# Patient Record
Sex: Female | Born: 1950 | Race: White | Hispanic: No | Marital: Married | Smoking: Former smoker
Health system: Southern US, Community
[De-identification: ages and names within clinical notes are randomized; demographics above are authoritative.]

## PROBLEM LIST (undated history)

## (undated) DIAGNOSIS — F419 Anxiety disorder, unspecified: Secondary | ICD-10-CM

## (undated) DIAGNOSIS — F32A Depression, unspecified: Secondary | ICD-10-CM

## (undated) DIAGNOSIS — F509 Eating disorder, unspecified: Secondary | ICD-10-CM

## (undated) DIAGNOSIS — F329 Major depressive disorder, single episode, unspecified: Secondary | ICD-10-CM

## (undated) DIAGNOSIS — T7840XA Allergy, unspecified, initial encounter: Secondary | ICD-10-CM

## (undated) DIAGNOSIS — Z9289 Personal history of other medical treatment: Secondary | ICD-10-CM

## (undated) DIAGNOSIS — H269 Unspecified cataract: Secondary | ICD-10-CM

## (undated) DIAGNOSIS — K449 Diaphragmatic hernia without obstruction or gangrene: Secondary | ICD-10-CM

## (undated) DIAGNOSIS — Z9889 Other specified postprocedural states: Secondary | ICD-10-CM

## (undated) DIAGNOSIS — K219 Gastro-esophageal reflux disease without esophagitis: Secondary | ICD-10-CM

## (undated) DIAGNOSIS — C801 Malignant (primary) neoplasm, unspecified: Secondary | ICD-10-CM

## (undated) HISTORY — PX: ABDOMINAL HYSTERECTOMY: SHX81

## (undated) HISTORY — PX: CARDIAC CATHETERIZATION: SHX172

## (undated) HISTORY — DX: Eating disorder, unspecified: F50.9

## (undated) HISTORY — DX: Unspecified cataract: H26.9

## (undated) HISTORY — DX: Personal history of other medical treatment: Z92.89

## (undated) HISTORY — DX: Allergy, unspecified, initial encounter: T78.40XA

## (undated) HISTORY — DX: Malignant (primary) neoplasm, unspecified: C80.1

## (undated) HISTORY — DX: Anxiety disorder, unspecified: F41.9

## (undated) HISTORY — PX: WISDOM TOOTH EXTRACTION: SHX21

## (undated) HISTORY — PX: APPENDECTOMY: SHX54

## (undated) HISTORY — DX: Gastro-esophageal reflux disease without esophagitis: K21.9

## (undated) HISTORY — DX: Depression, unspecified: F32.A

## (undated) HISTORY — DX: Diaphragmatic hernia without obstruction or gangrene: K44.9

## (undated) HISTORY — DX: Major depressive disorder, single episode, unspecified: F32.9

## (undated) HISTORY — DX: Other specified postprocedural states: Z98.890

---

## 1998-08-13 ENCOUNTER — Emergency Department (HOSPITAL_COMMUNITY): Admission: EM | Admit: 1998-08-13 | Discharge: 1998-08-13 | Payer: Self-pay | Admitting: Emergency Medicine

## 1998-09-08 ENCOUNTER — Other Ambulatory Visit: Admission: RE | Admit: 1998-09-08 | Discharge: 1998-09-08 | Payer: Self-pay | Admitting: Obstetrics and Gynecology

## 1999-09-15 ENCOUNTER — Encounter: Admission: RE | Admit: 1999-09-15 | Discharge: 1999-09-15 | Payer: Self-pay | Admitting: Family Medicine

## 1999-09-15 ENCOUNTER — Encounter: Payer: Self-pay | Admitting: Family Medicine

## 1999-09-15 HISTORY — PX: UPPER GASTROINTESTINAL ENDOSCOPY: SHX188

## 1999-09-21 ENCOUNTER — Encounter: Payer: Self-pay | Admitting: Family Medicine

## 1999-09-21 ENCOUNTER — Encounter: Admission: RE | Admit: 1999-09-21 | Discharge: 1999-09-21 | Payer: Self-pay | Admitting: Family Medicine

## 1999-09-21 DIAGNOSIS — Z9889 Other specified postprocedural states: Secondary | ICD-10-CM

## 1999-09-21 HISTORY — DX: Other specified postprocedural states: Z98.890

## 2000-04-19 DIAGNOSIS — Z9289 Personal history of other medical treatment: Secondary | ICD-10-CM

## 2000-04-19 HISTORY — PX: CARDIAC CATHETERIZATION: SHX172

## 2000-04-19 HISTORY — DX: Personal history of other medical treatment: Z92.89

## 2000-04-29 ENCOUNTER — Inpatient Hospital Stay (HOSPITAL_COMMUNITY): Admission: EM | Admit: 2000-04-29 | Discharge: 2000-05-01 | Payer: Self-pay | Admitting: Emergency Medicine

## 2000-04-29 ENCOUNTER — Encounter: Payer: Self-pay | Admitting: Family Medicine

## 2000-04-30 ENCOUNTER — Encounter: Payer: Self-pay | Admitting: Emergency Medicine

## 2001-01-26 ENCOUNTER — Ambulatory Visit (HOSPITAL_COMMUNITY): Admission: RE | Admit: 2001-01-26 | Discharge: 2001-01-26 | Payer: Self-pay | Admitting: Family Medicine

## 2001-02-13 ENCOUNTER — Encounter: Payer: Self-pay | Admitting: Neurosurgery

## 2001-02-13 ENCOUNTER — Ambulatory Visit (HOSPITAL_COMMUNITY): Admission: RE | Admit: 2001-02-13 | Discharge: 2001-02-13 | Payer: Self-pay | Admitting: Neurosurgery

## 2001-10-20 ENCOUNTER — Emergency Department (HOSPITAL_COMMUNITY): Admission: EM | Admit: 2001-10-20 | Discharge: 2001-10-20 | Payer: Self-pay

## 2002-01-27 ENCOUNTER — Encounter: Admission: RE | Admit: 2002-01-27 | Discharge: 2002-04-27 | Payer: Self-pay | Admitting: Family Medicine

## 2002-07-10 ENCOUNTER — Other Ambulatory Visit: Admission: RE | Admit: 2002-07-10 | Discharge: 2002-07-10 | Payer: Self-pay | Admitting: Obstetrics and Gynecology

## 2003-06-10 ENCOUNTER — Emergency Department (HOSPITAL_COMMUNITY): Admission: EM | Admit: 2003-06-10 | Discharge: 2003-06-10 | Payer: Self-pay | Admitting: Emergency Medicine

## 2004-08-29 ENCOUNTER — Encounter: Admission: RE | Admit: 2004-08-29 | Discharge: 2004-08-29 | Payer: Self-pay | Admitting: Family Medicine

## 2007-06-20 DIAGNOSIS — C801 Malignant (primary) neoplasm, unspecified: Secondary | ICD-10-CM

## 2007-06-20 HISTORY — DX: Malignant (primary) neoplasm, unspecified: C80.1

## 2008-08-26 ENCOUNTER — Encounter: Payer: Self-pay | Admitting: Family Medicine

## 2008-09-07 ENCOUNTER — Ambulatory Visit: Payer: Self-pay | Admitting: Family Medicine

## 2008-09-07 DIAGNOSIS — M79609 Pain in unspecified limb: Secondary | ICD-10-CM | POA: Insufficient documentation

## 2008-09-07 DIAGNOSIS — F339 Major depressive disorder, recurrent, unspecified: Secondary | ICD-10-CM | POA: Insufficient documentation

## 2008-09-07 DIAGNOSIS — F329 Major depressive disorder, single episode, unspecified: Secondary | ICD-10-CM

## 2008-09-07 HISTORY — DX: Pain in unspecified limb: M79.609

## 2009-05-19 ENCOUNTER — Ambulatory Visit: Payer: Self-pay | Admitting: Family Medicine

## 2009-05-19 DIAGNOSIS — L600 Ingrowing nail: Secondary | ICD-10-CM | POA: Insufficient documentation

## 2009-05-19 HISTORY — DX: Ingrowing nail: L60.0

## 2009-11-10 ENCOUNTER — Ambulatory Visit: Payer: Self-pay | Admitting: Family Medicine

## 2009-11-10 DIAGNOSIS — M2550 Pain in unspecified joint: Secondary | ICD-10-CM

## 2009-11-10 DIAGNOSIS — R0602 Shortness of breath: Secondary | ICD-10-CM

## 2009-11-10 DIAGNOSIS — R635 Abnormal weight gain: Secondary | ICD-10-CM | POA: Insufficient documentation

## 2009-11-10 HISTORY — DX: Abnormal weight gain: R63.5

## 2009-11-10 HISTORY — DX: Shortness of breath: R06.02

## 2009-11-10 HISTORY — DX: Pain in unspecified joint: M25.50

## 2009-11-11 ENCOUNTER — Ambulatory Visit: Payer: Self-pay | Admitting: Family Medicine

## 2009-11-11 LAB — CONVERTED CEMR LAB
ALT: 33 units/L (ref 0–35)
AST: 23 units/L (ref 0–37)
Albumin: 3.9 g/dL (ref 3.5–5.2)
Alkaline Phosphatase: 51 units/L (ref 39–117)
BUN: 17 mg/dL (ref 6–23)
Basophils Absolute: 0 10*3/uL (ref 0.0–0.1)
Basophils Relative: 0.6 % (ref 0.0–3.0)
Bilirubin Urine: NEGATIVE
Bilirubin, Direct: 0.1 mg/dL (ref 0.0–0.3)
Blood in Urine, dipstick: NEGATIVE
CO2: 28 meq/L (ref 19–32)
Calcium: 9.5 mg/dL (ref 8.4–10.5)
Chloride: 104 meq/L (ref 96–112)
Cholesterol: 172 mg/dL (ref 0–200)
Creatinine, Ser: 0.9 mg/dL (ref 0.4–1.2)
Eosinophils Absolute: 0.2 10*3/uL (ref 0.0–0.7)
Eosinophils Relative: 5 % (ref 0.0–5.0)
GFR calc non Af Amer: 66.28 mL/min (ref >60)
Glucose, Bld: 88 mg/dL (ref 70–99)
Glucose, Urine, Semiquant: NEGATIVE
HCT: 41.9 % (ref 36.0–46.0)
HDL: 50.4 mg/dL (ref >39.00)
Hemoglobin: 14.4 g/dL (ref 12.0–15.0)
Ketones, urine, test strip: NEGATIVE
LDL Cholesterol: 91 mg/dL (ref 0–99)
Lymphocytes Relative: 37.2 % (ref 12.0–46.0)
Lymphs Abs: 1.4 10*3/uL (ref 0.7–4.0)
MCHC: 34.4 g/dL (ref 30.0–36.0)
MCV: 94 fL (ref 78.0–100.0)
Monocytes Absolute: 0.4 10*3/uL (ref 0.1–1.0)
Monocytes Relative: 10.4 % (ref 3.0–12.0)
Neutro Abs: 1.8 10*3/uL (ref 1.4–7.7)
Neutrophils Relative %: 46.8 % (ref 43.0–77.0)
Nitrite: NEGATIVE
Platelets: 219 10*3/uL (ref 150.0–400.0)
Potassium: 4.3 meq/L (ref 3.5–5.1)
RBC: 4.46 M/uL (ref 3.87–5.11)
RDW: 13.2 % (ref 11.5–14.6)
Sodium: 142 meq/L (ref 135–145)
Specific Gravity, Urine: 1.025
TSH: 2.27 microintl units/mL (ref 0.35–5.50)
Total Bilirubin: 0.4 mg/dL (ref 0.3–1.2)
Total CHOL/HDL Ratio: 3
Total Protein: 6.2 g/dL (ref 6.0–8.3)
Triglycerides: 152 mg/dL — ABNORMAL HIGH (ref 0.0–149.0)
Urobilinogen, UA: 0.2
VLDL: 30.4 mg/dL (ref 0.0–40.0)
WBC Urine, dipstick: NEGATIVE
WBC: 3.8 10*3/uL — ABNORMAL LOW (ref 4.5–10.5)
pH: 7

## 2009-11-19 ENCOUNTER — Ambulatory Visit: Payer: Self-pay | Admitting: Family Medicine

## 2009-11-24 ENCOUNTER — Encounter (INDEPENDENT_AMBULATORY_CARE_PROVIDER_SITE_OTHER): Payer: Self-pay | Admitting: *Deleted

## 2009-11-25 ENCOUNTER — Encounter: Admission: RE | Admit: 2009-11-25 | Discharge: 2009-11-25 | Payer: Self-pay | Admitting: Family Medicine

## 2009-12-01 ENCOUNTER — Ambulatory Visit: Payer: Self-pay | Admitting: Family Medicine

## 2009-12-01 DIAGNOSIS — J069 Acute upper respiratory infection, unspecified: Secondary | ICD-10-CM | POA: Insufficient documentation

## 2009-12-01 HISTORY — DX: Acute upper respiratory infection, unspecified: J06.9

## 2009-12-22 ENCOUNTER — Encounter (INDEPENDENT_AMBULATORY_CARE_PROVIDER_SITE_OTHER): Payer: Self-pay | Admitting: *Deleted

## 2009-12-23 ENCOUNTER — Ambulatory Visit: Payer: Self-pay | Admitting: Gastroenterology

## 2009-12-31 ENCOUNTER — Ambulatory Visit: Payer: Self-pay | Admitting: Gastroenterology

## 2009-12-31 HISTORY — PX: COLONOSCOPY: SHX174

## 2010-01-05 ENCOUNTER — Encounter: Payer: Self-pay | Admitting: Gastroenterology

## 2010-04-28 ENCOUNTER — Ambulatory Visit (HOSPITAL_COMMUNITY): Admission: RE | Admit: 2010-04-28 | Discharge: 2010-04-28 | Payer: Self-pay | Admitting: Ophthalmology

## 2010-07-10 ENCOUNTER — Encounter: Payer: Self-pay | Admitting: Family Medicine

## 2010-07-19 NOTE — Letter (Signed)
Summary: [facility] Instructions  [facility]  [address] [place], [place] [address]   Phone: [phone]  Fax: [phone]       SHIGEKO MANARD    06-21-1960    MRN: [id number]        Procedure Day Dorna Bloom:  Farrell Ours  12/31/09     Arrival Time:  10:30AM     Procedure Time:  11:30AM     Location of Procedure:                    _X _  [facility] (4th Floor)                        PREPARATION FOR COLONOSCOPY WITH MOVIPREP   Starting 5 days prior to your procedure 12/26/09 do not eat nuts, seeds, popcorn, corn, beans, peas,  salads, or any raw vegetables.  Do not take any fiber supplements (e.g. Metamucil, Citrucel, and Benefiber).  THE DAY BEFORE YOUR PROCEDURE         DATE: 12/30/09  DAY: THURSDAY  1.  Drink clear liquids the entire day-NO SOLID FOOD  2.  Do not drink anything colored red or purple.  Avoid juices with pulp.  No orange juice.  3.  Drink at least 64 oz. (8 glasses) of fluid/clear liquids during the day to prevent dehydration and help the prep work efficiently.  CLEAR LIQUIDS INCLUDE: Water Jello Ice Popsicles Tea (sugar ok, no milk/cream) Powdered fruit flavored drinks Coffee (sugar ok, no milk/cream) Gatorade Juice: apple, white grape, white cranberry  Lemonade Clear bullion, consomm, broth Carbonated beverages (any kind) Strained chicken noodle soup Hard Candy                             4.  In the morning, mix first dose of MoviPrep solution:    Empty 1 Pouch A and 1 Pouch B into the disposable container    Add lukewarm drinking water to the top line of the container. Mix to dissolve    Refrigerate (mixed solution should be used within 24 hrs)  5.  Begin drinking the prep at 5:00 p.m. The MoviPrep container is divided by 4 marks.   Every 15 minutes drink the solution down to the next mark (approximately 8 oz) until the full liter is complete.   6.  Follow completed prep with 16 oz of clear liquid of your choice  (Nothing red or purple).  Continue to drink clear liquids until bedtime.  7.  Before going to bed, mix second dose of MoviPrep solution:    Empty 1 Pouch A and 1 Pouch B into the disposable container    Add lukewarm drinking water to the top line of the container. Mix to dissolve    Refrigerate  THE DAY OF YOUR PROCEDURE      DATE: 12/31/09  DAY: FRIDAY  Beginning at 6:30AM (5 hours before procedure):         1. Every 15 minutes, drink the solution down to the next mark (approx 8 oz) until the full liter is complete.  2. Follow completed prep with 16 oz. of clear liquid of your choice.    3. You may drink clear liquids until 9:30AM (2 HOURS BEFORE PROCEDURE).   MEDICATION INSTRUCTIONS  Unless otherwise instructed, you should take regular prescription medications with a small sip of water   as early as possible the morning  of your procedure.   Additional medication instructions: _Hold furosemide the am of your procedure.         OTHER INSTRUCTIONS  You will need a responsible adult at least 61 years of age to accompany you and drive you home.   This person must remain in the waiting room during your procedure.  Wear loose fitting clothing that is easily removed.  Leave jewelry and other valuables at home.  However, you may wish to bring a book to read or  an iPod/MP3 player to listen to music as you wait for your procedure to start.  Remove all body piercing jewelry and leave at home.  Total time from sign-in until discharge is approximately 2-3 hours.  You should go home directly after your procedure and rest.  You can resume normal activities the  day after your procedure.  The day of your procedure you should not:   Drive   Make legal decisions   Operate machinery   Drink alcohol   Return to work  You will receive specific instructions about eating, activities and medications before you leave.    The above instructions have been reviewed and  explained to me by   Clide Cliff, RN______________________    I fully understand and can verbalize these instructions _____________________________ Date _________

## 2010-07-19 NOTE — Letter (Signed)
Summary: Out of Work  Adult nurse at [employer]  [address]   [place], [place] [address]   Phone: [phone]  Fax: [phone]    December 01, 2009     Employee:  TATE JERKINS    To Whom It May Concern:   For Medical reasons, please excuse the above named employee from work for the following dates:  Start:   12-01-2009  End:   12-02-2009, may return to work on Friday 12-03-2009  If you need additional information, please feel free to contact our office.         Sincerely,        Evelena Peat, MD

## 2010-07-19 NOTE — Letter (Signed)
Summary: Results Letter  [facility]  [address] [place], [place] [address]   Phone: [phone]  Fax: [phone]        January 05, 2010 MRN: [id number]    [facility] [address] [place], [place]  [address]    Dear Ms. Nova,   At least one of the polyps removed during your recent procedure was proven to be adenomatous.  These are pre-cancerous polyps that may have grown into cancers if they had not been removed.  Based on current nationally recognized surveillance guidelines, I recommend that you have a repeat colonoscopy in 5 years.  We will therefore put your information in our reminder system and will contact you in 5 years to schedule a repeat procedure.  Please call if you have any questions or concerns.       Sincerely,  Rachael Fee MD  This letter has been electronically signed by your physician.  Appended Document: Results Letter letter mailed.

## 2010-07-19 NOTE — Assessment & Plan Note (Signed)
Summary: JOINT PAIN/JUST NOT FEELING WELL/RCD   Vital Signs:  Patient profile:   60 year old female Menstrual status:  postmenopausal Weight:      221 pounds Temp:     98.4 degrees F oral BP sitting:   110 / 82  (left arm) Cuff size:   large  Vitals Entered By: Sid Falcon LPN (Nov 10, 2009 1:39 PM) CC: Not feeling well, joint discomfort   History of Present Illness: Here with multiple complaints of fatigue, arthralgias (ankles, knees, wrists), and weight gain over several months.  Has not had CPE or any labs in several years.  No pitting edema.  Meds reviewed and no signif changes. Hx depression but those sxs are stable. Sleep generally OK.  Freq sensation of air hunger at rest mostly.  Occ exertional dyspnea.  No chest pain. No orthopnea.  Allergies: 1)  Prednisone (Prednisone) 2)  Penicillin V Potassium (Penicillin V Potassium)  Past History:  Past Medical History: Last updated: 10/16/2008 Depression Skin Cancer 2009 Eating disorder Glaucoma Anxiety disorder Small Hiatal hernia Cardiolyte stress test 11/01 Flex sig 09/21/99 ([facility]) Heart cath  11/01 Verdis Prime) UGI 09/15/99  Past Surgical History: Last updated: 10/16/2008 Hysterectomy 1979 Skin Cancer 2009 Appendectomy 1983  Family History: Last updated: 09/07/2008 Family History of Arthritis Family History Lung cancer parent Family History of Cardiovascular disorder  parent Strioke grandparent  Social History: Last updated: 09/07/2008 Occupation: Scientist, water quality Married  Risk Factors: Alcohol Use: <1 (09/07/2008)  Risk Factors: Smoking Status: never (09/07/2008) PMH-FH-SH reviewed for relevance  Review of Systems  The patient denies anorexia, fever, weight loss, chest pain, syncope, dyspnea on exertion, peripheral edema, prolonged cough, headaches, hemoptysis, abdominal pain, melena, hematochezia, severe indigestion/heartburn, depression, and enlarged lymph nodes.    Physical  Exam  General:  Well-developed,well-nourished,in no acute distress; alert,appropriate and cooperative throughout examination Eyes:  pupils equal, pupils round, and pupils reactive to light.   Ears:  External ear exam shows no significant lesions or deformities.  Otoscopic examination reveals clear canals, tympanic membranes are intact bilaterally without bulging, retraction, inflammation or discharge. Hearing is grossly normal bilaterally. Mouth:  Oral mucosa and oropharynx without lesions or exudates.  Teeth in good repair. Neck:  No deformities, masses, or tenderness noted. Lungs:  Normal respiratory effort, chest expands symmetrically. Lungs are clear to auscultation, no crackles or wheezes. Heart:  Normal rate and regular rhythm. S1 and S2 normal without gallop, murmur, click, rub or other extra sounds. Extremities:  no pitting edema.  No joint warmth or erythema. Neurologic:  alert & oriented X3, cranial nerves II-XII intact, strength normal in all extremities, and gait normal.   Cervical Nodes:  No lymphadenopathy noted Psych:  normally interactive, good eye contact, not anxious appearing, and not depressed appearing.     Impression & Recommendations:  Problem # 1:  ARTHRALGIA (ICD-719.40) No signs of active joint inflammation.  Problem # 2:  WEIGHT GAIN (ICD-783.1) discussed weight loss strategies.  Needs screening labs and these  will be scheduled.  Problem # 3:  DYSPNEA (ICD-786.05) ? anxiety related sense of air hunger.  Complete Medication List: 1)  Trazodone Hcl 100 Mg Tabs (Trazodone hcl) .[name].. 1-3 at  bedtime 2)  Alprazolam 1 Mg Tabs (Alprazolam) .... As needed 3)  Lumigan 0.03 % Soln (Bimatoprost) .... One drop to both eyes once daily 4)  Furosemide 20 Mg Tabs (Furosemide) .... 1/2 tab every other day 5)  Indomethacin 50 Mg Caps (Indomethacin) .... One by mouth q 8  hr as needed 6)  Combigan 0.2-0.5 % Soln (Brimonidine tartrate-timolol) .... One drop to right eye two  times a day  Patient Instructions: 1)  Schedule CPE and appropriate lab work.

## 2010-07-19 NOTE — Procedures (Signed)
Summary: Colonoscopy  Patient: [name] Note: All result statuses are Final unless otherwise noted.  Tests: (1) Colonoscopy (COL)   COL Colonoscopy           DONE     [facility]     [address]     [place], [place]  [address]           COLONOSCOPY PROCEDURE REPORT           PATIENT:  Emily [name], Emily [name]  MR#:  [id number]     BIRTHDATE:  [birth year], 58 yrs. old  GENDER:  female     ENDOSCOPIST:  Rachael Fee, MD     REF. BY:  Evelena Peat, M.D.     PROCEDURE DATE:  12/31/2009     PROCEDURE:  Colonoscopy with snare polypectomy     ASA CLASS:  Class II     INDICATIONS:  Routine Risk Screening     MEDICATIONS:   Fentanyl 50 mcg IV, Versed 5 mg IV           DESCRIPTION OF PROCEDURE:   After the risks benefits and     alternatives of the procedure were thoroughly explained, informed     consent was obtained.  Digital rectal exam was performed and     revealed no rectal masses.   The LB PCF-[id number] endoscope     was introduced through the anus and advanced to the cecum, which     was identified by both the appendix and ileocecal valve, without     limitations.  The quality of the prep was good, using MoviPrep.     The instrument was then slowly withdrawn as the colon was fully     examined.     <<PROCEDUREIMAGES>>           FINDINGS:  Mild diverticulosis was found in the sigmoid to     descending colon segments (see image6).  A sessile polyp was found     in the descending colon. This was 2mm across, removed with cold     snare and sent to pathology (jar 1) (see image4 and image5).  This     was otherwise a normal examination of the colon (see image7,     image2, and image3).   Retroflexed views in the rectum revealed no     abnormalities.    The scope was then withdrawn from the patient     and the procedure completed.           COMPLICATIONS:  None     ENDOSCOPIC IMPRESSION:     1) Mild diverticulosis in the sigmoid to descending colon     segments     2) Small sessile polyp in the descending colon, removed and sent     to pathology     3) Otherwise normal examination           RECOMMENDATIONS:     1) If the polyp(s) removed today are proven to be adenomatous     (pre-cancerous) polyps, you will need a repeat colonoscopy in 5     years. Otherwise you should continue to follow colorectal cancer     screening guidelines for "routine risk" patients with colonoscopy     in 10 years.     2) You will receive a letter within 1-2 weeks with the results     of your biopsy as well as final recommendations. Please call my     office if  you have not received a letter after 3 weeks.           ______________________________     Rachael Fee, MD           n.     eSIGNED:   Rachael Fee at 12/31/2009 12:05 PM           Rebeca Alert, [phone]  Note: An exclamation mark (!) indicates a result that was not dispersed into the flowsheet. Document Creation Date: 12/31/2009 12:05 PM _______________________________________________________________________  (1) Order result status: Final Collection or observation date-time: 12/31/2009 12:01 Requested date-time:  Receipt date-time:  Reported date-time:  Referring Physician:   Ordering Physician: Rob Bunting [phone]) Specimen Source:  Source: Launa Grill Order Number: [id number] Lab site:   Appended Document: Colonoscopy recall     Procedures Next Due Date:    Colonoscopy: 12/2014

## 2010-07-19 NOTE — Miscellaneous (Signed)
Summary: DIR COL...AS.  Clinical Lists Changes  Medications: Added new medication of MOVIPREP 100 GM  SOLR (PEG-KCL-NACL-NASULF-NA ASC-C) As directed - Signed Rx of MOVIPREP 100 GM  SOLR (PEG-KCL-NACL-NASULF-NA ASC-C) As directed;  #1 x 0;  Signed;  Entered by: Clide Cliff RN;  Authorized by: Rachael Fee MD;  Method used: Electronically to [facility]  [place] [address], [place], [place]  [address], Ph: [phone] or [phone], Fax: [phone] Observations: Added new observation of ALLERGY REV: Done (12/23/2009 7:59)    Prescriptions: MOVIPREP 100 GM  SOLR (PEG-KCL-NACL-NASULF-NA ASC-C) As directed  #1 x 0   Entered by:   Clide Cliff RN   Authorized by:   Rachael Fee MD   Signed by:   Clide Cliff RN on 12/23/2009   Method used:   Electronically to        [facility]  [place] [address] (retail)       [address]       [place], [place]  [address]       Ph: [phone] or [phone]       Fax: [phone]   RxID:   [id number]

## 2010-07-19 NOTE — Assessment & Plan Note (Signed)
Summary: cpx//ccm   Vital Signs:  Patient profile:   60 year old female Menstrual status:  hysterectomy Height:      67 inches Weight:      227 pounds BMI:     35.68 Temp:     98.7 degrees F oral Pulse rate:   80 / minute Pulse rhythm:   regular Resp:     12 per minute BP sitting:   130 / 82  (left arm) Cuff size:   large  Vitals Entered By: Sid Falcon LPN (November 19, 1608 3:00 PM) CC: CPX, labs done     Menstrual Status hysterectomy   History of Present Illness: Here for CPE.   No recent mammogram.  Last tetanus > 10 years. No hx colonoscopy.  Prior hysterectomy-for benign disease.  No regular exercise. PMH, SH, AND FH reviewed.  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  Allergies: 1)  Prednisone (Prednisone) 2)  Penicillin V Potassium (Penicillin V Potassium)  Past History:  Past Medical History: Last updated: 10/16/2008 Depression Skin Cancer 2009 Eating disorder Glaucoma Anxiety disorder Small Hiatal hernia Cardiolyte stress test 11/01 Flex sig 09/21/99 ([facility]) Heart cath  11/01 Verdis Prime) UGI 09/15/99  Family History: Last updated: 11/19/2009 Family History of Arthritis Family History Lung cancer aunt Family History of Cardiovascular disorder  parent Strioke grandparent  Social History: Last updated: 09/07/2008 Occupation: Scientist, water quality Married  Risk Factors: Alcohol Use: <1 (09/07/2008)  Risk Factors: Smoking Status: never (09/07/2008)  Past Surgical History: Hysterectomy 1979 Skin Cancer 2009 squamous cell ca Appendectomy 1979  Family History: Family History of Arthritis Family History Lung cancer aunt Family History of Cardiovascular disorder  parent Strioke grandparent  Review of Systems  The patient denies anorexia, fever, weight loss, weight gain, vision loss, decreased hearing, chest pain, syncope, dyspnea on exertion, prolonged cough, headaches, hemoptysis, abdominal pain,  melena, hematochezia, severe indigestion/heartburn, hematuria, incontinence, genital sores, muscle weakness, suspicious skin lesions, transient blindness, difficulty walking, depression, unusual weight change, enlarged lymph nodes, and breast masses.    Physical Exam  General:  moderately obese, pleasant and in no apparent distress. Head:  Normocephalic and atraumatic without obvious abnormalities. No apparent alopecia or balding. Eyes:  pupils equal, pupils round, and pupils reactive to light.   Ears:  External ear exam shows no significant lesions or deformities.  Otoscopic examination reveals clear canals, tympanic membranes are intact bilaterally without bulging, retraction, inflammation or discharge. Hearing is grossly normal bilaterally. Mouth:  Oral mucosa and oropharynx without lesions or exudates.  Teeth in good repair. Neck:  No deformities, masses, or tenderness noted. Breasts:  No mass, nodules, thickening, tenderness, bulging, retraction, inflamation, nipple discharge or skin changes noted.   Lungs:  Normal respiratory effort, chest expands symmetrically. Lungs are clear to auscultation, no crackles or wheezes. Heart:  Normal rate and regular rhythm. S1 and S2 normal without gallop, murmur, click, rub or other extra sounds. Abdomen:  Bowel sounds positive,abdomen soft and non-tender without masses, organomegaly or hernias noted. Genitalia:  pap deferred as prior hysterectomy Msk:  No deformity or scoliosis noted of thoracic or lumbar spine.   Extremities:  No clubbing, cyanosis, edema, or deformity noted with normal full range of motion of all joints.   Neurologic:  alert & oriented X3, cranial nerves II-XII intact, and strength normal in all extremities.   Skin:  no rashes and no suspicious lesions.   Cervical Nodes:  No lymphadenopathy noted Psych:  Cognition and judgment  appear intact. Alert and cooperative with normal attention span and concentration. No apparent delusions,  illusions, hallucinations   Impression & Recommendations:  Problem # 1:  Preventive Health Care (ICD-V70.0) needs mammogram, tetanus, colonoscopy.  Labs reviewed with pt and all OK. No indication for pap.  Needs to lose weight.  Pt considering weight watchers.  Encouraged to exercise more.  Complete Medication List: 1)  Trazodone Hcl 100 Mg Tabs (Trazodone hcl) .[name].. 1-3 at  bedtime 2)  Alprazolam 1 Mg Tabs (Alprazolam) .... As needed 3)  Lumigan 0.03 % Soln (Bimatoprost) .... One drop to both eyes once daily 4)  Furosemide 20 Mg Tabs (Furosemide) .... 1/2 tab every other day 5)  Indomethacin 50 Mg Caps (Indomethacin) .... One by mouth q 8 hr as needed 6)  Combigan 0.2-0.5 % Soln (Brimonidine tartrate-timolol) .... One drop to right eye two times a day  Other Orders: Gastroenterology Referral (GI) Tdap => 89yrs IM ([address]) Admin 1st Vaccine ([address])  Patient Instructions: 1)  It is important that you exercise reguarly at least 20 minutes 5 times a week. If you develop chest pain, have severe difficulty breathing, or feel very tired, stop exercising immediately and seek medical attention.  2)  You need to lose weight. Consider a lower calorie diet and regular exercise.  3)  Schedule your mammogram.      Immunizations Administered:  Tetanus Vaccine:    Vaccine Type: Tdap    Site: left buttock    Mfr: GlaxoSmithKline    Dose: 0.5 ml    Route: IM    Given by: Sid Falcon LPN    Exp. Date: 09/11/2011    Lot #: [id number]

## 2010-07-19 NOTE — Assessment & Plan Note (Signed)
Summary: cough/chest congestion/earache/sore throat/? inf toe/cjr   Vital Signs:  Patient profile:   60 year old female Menstrual status:  hysterectomy Weight:      221 pounds Temp:     99.1 degrees F oral BP sitting:   110 / 80  (left arm) Cuff size:   large  Vitals Entered By: Kathrynn Speed CMA (December 01, 2009 2:54 PM) CC: Cough chest congestion  1 wk, earache in both, sore throat   History of Present Illness: 3 to four-day history of cough mostly nonproductive, nasal congestion, bilateral ear ache, and sore throat. Husband has been developing similar symptoms. Questionable low-grade fever yesterday. Advil helps slightly. History of frequent strep throat in the past. Allergy to penicillin.  Current Medications (verified): 1)  Trazodone Hcl 100 Mg Tabs (Trazodone Hcl) .[name].. 1-3 At  Bedtime 2)  Alprazolam 1 Mg Tabs (Alprazolam) .... As Needed 3)  Lumigan 0.03 % Soln (Bimatoprost) .... One Drop To Both Eyes Once Daily 4)  Furosemide 20 Mg Tabs (Furosemide) .... 1/2 Tab Every Other Day 5)  Indomethacin 50 Mg Caps (Indomethacin) .... One By Mouth Q 8 Hr As Needed 6)  Combigan 0.2-0.5 % Soln (Brimonidine Tartrate-Timolol) .... One Drop To Right Eye Two Times A Day  Allergies (verified): 1)  Prednisone (Prednisone) 2)  Penicillin V Potassium (Penicillin V Potassium)  Past History:  Past Medical History: Last updated: 10/16/2008 Depression Skin Cancer 2009 Eating disorder Glaucoma Anxiety disorder Small Hiatal hernia Cardiolyte stress test 11/01 Flex sig 09/21/99 ([facility]) Heart cath  11/01 Verdis Prime) UGI 09/15/99  Review of Systems      See HPI  Physical Exam  General:  Well-developed,well-nourished,in no acute distress; alert,appropriate and cooperative throughout examination Ears:  External ear exam shows no significant lesions or deformities.  Otoscopic examination reveals clear canals, tympanic membranes are intact bilaterally without bulging, retraction,  inflammation or discharge. Hearing is grossly normal bilaterally. Nose:  External nasal examination shows no deformity or inflammation. Nasal mucosa are pink and moist without lesions or exudates. Mouth:  posterior pharynx erythematous. No exudate. Neck:  No deformities, masses, or tenderness noted. Lungs:  Normal respiratory effort, chest expands symmetrically. Lungs are clear to auscultation, no crackles or wheezes. Heart:  Normal rate and regular rhythm. S1 and S2 normal without gallop, murmur, click, rub or other extra sounds.   Impression & Recommendations:  Problem # 1:  VIRAL URI (ICD-465.9)  cough medication prescribed. Rapid strep negative. Her updated medication list for this problem includes:    Indomethacin 50 Mg Caps (Indomethacin) ..... One by mouth q 8 hr as needed    Hydrocodone-homatropine 5-1.5 Mg/49ml Syrp (Hydrocodone-homatropine) .[name]... 1 tsp by mouth q 4-6 hours as needed cough  Orders: Rapid Strep ([address])  Complete Medication List: 1)  Trazodone Hcl 100 Mg Tabs (Trazodone hcl) .[name].. 1-3 at  bedtime 2)  Alprazolam 1 Mg Tabs (Alprazolam) .... As needed 3)  Lumigan 0.03 % Soln (Bimatoprost) .... One drop to both eyes once daily 4)  Furosemide 20 Mg Tabs (Furosemide) .... 1/2 tab every other day 5)  Indomethacin 50 Mg Caps (Indomethacin) .... One by mouth q 8 hr as needed 6)  Combigan 0.2-0.5 % Soln (Brimonidine tartrate-timolol) .... One drop to right eye two times a day 7)  Hydrocodone-homatropine 5-1.5 Mg/55ml Syrp (Hydrocodone-homatropine) .[name].. 1 tsp by mouth q 4-6 hours as needed cough  Patient Instructions: 1)  Get plenty of rest, drink lots of clear liquids, and use Tylenol or Ibuprofen for fever and comfort.  Return in 7-10 days if you're not better: sooner if you'er feeling worse.  Prescriptions: HYDROCODONE-HOMATROPINE 5-1.5 MG/5ML SYRP (HYDROCODONE-HOMATROPINE) 1 tsp by mouth q 4-6 hours as needed cough  #120 ml x 1   Entered and Authorized by:   Evelena Peat MD   Signed by:   Evelena Peat MD on 12/01/2009   Method used:   Print then Give to Patient   RxID:   [id number]

## 2010-07-19 NOTE — Letter (Signed)
Summary: Previsit letter  [facility]  [address] [place], [place] [address]   Phone: [phone]  Fax: [phone]       11/24/2009 MRN: [id number]  [facility] [address] [place], [place]  [address]  Dear Ms. Rodas,  Welcome to the Gastroenterology Division at [facility].    You are scheduled to see a nurse for your pre-procedure visit on 12/23/2009 at 8:00AM on the 3rd floor at [facility], [address]  We ask that you try to arrive at our office 15 minutes prior to your appointment time to allow for check-in.  Your nurse visit will consist of discussing your medical and surgical history, your immediate family medical history, and your medications.    Please bring a complete list of all your medications or, if you prefer, bring the medication bottles and we will list them.  We will need to be aware of both prescribed and over the counter drugs.  We will need to know exact dosage information as well.  If you are on blood thinners (Coumadin, Plavix, Aggrenox, Ticlid, etc.) please call our office today/prior to your appointment, as we need to consult with your physician about holding your medication.   Please be prepared to read and sign documents such as consent forms, a financial agreement, and acknowledgement forms.  If necessary, and with your consent, a friend or relative is welcome to sit-in on the nurse visit with you.  Please bring your insurance card so that we may make a copy of it.  If your insurance requires a referral to see a specialist, please bring your referral form from your primary care physician.  No co-pay is required for this nurse visit.     If you cannot keep your appointment, please call [phone] to cancel or reschedule prior to your appointment date.  This allows [place] the opportunity to schedule an appointment for another patient in need of care.    Thank you for choosing [facility] for your  medical needs.  We appreciate the opportunity to care for you.  Please visit [place] at our website  to learn more about our practice.                     Sincerely.                                                                                                                   The Gastroenterology Division

## 2010-07-28 ENCOUNTER — Ambulatory Visit (INDEPENDENT_AMBULATORY_CARE_PROVIDER_SITE_OTHER): Payer: BC Managed Care – PPO | Admitting: Family Medicine

## 2010-07-28 ENCOUNTER — Encounter: Payer: Self-pay | Admitting: Family Medicine

## 2010-07-28 VITALS — BP 102/84 | HR 122 | Temp 98.9°F | Ht 66.75 in | Wt 225.0 lb

## 2010-07-28 DIAGNOSIS — J45909 Unspecified asthma, uncomplicated: Secondary | ICD-10-CM

## 2010-07-28 MED ORDER — METHYLPREDNISOLONE ACETATE 80 MG/ML IJ SUSP
80.0000 mg | Freq: Once | INTRAMUSCULAR | Status: AC
  Administered 2010-07-28: 80 mg via INTRAMUSCULAR

## 2010-07-28 MED ORDER — AZITHROMYCIN 250 MG PO TABS: ORAL_TABLET | ORAL | Status: AC

## 2010-07-28 MED ORDER — ALBUTEROL SULFATE HFA 108 (90 BASE) MCG/ACT IN AERS: 2.0000 | INHALATION_SPRAY | Freq: Four times a day (QID) | RESPIRATORY_TRACT | Status: DC | PRN

## 2010-07-28 MED ORDER — HYDROCODONE-HOMATROPINE 5-1.5 MG/5ML PO SYRP: 5.0000 mL | ORAL_SOLUTION | Freq: Four times a day (QID) | ORAL | Status: DC | PRN

## 2010-07-28 NOTE — Progress Notes (Signed)
  Subjective:    Patient ID: Emily [name], female    DOB: 1950-10-30, 60 y.o.   MRN: [id number]  HPI  Patient is seen with respiratory illness. Started almost one week ago. Initial sore throat followed by nasal congestion, bilateral earache, and cough. Cough now productive of yellow sputum. Increased wheezing off and on. No history of asthma. Ex-smoker. Questionable low-grade fever. Intermittent chills. Denies any hemoptysis, pleuritic pain , or significant dyspnea on exertion. Increased malaise. History on multiple over-the-counter medications including Claritin, Advil, and Mucinex DM without much relief. Very poor sleep secondary to cough. Denies any nausea, vomiting, or diarrhea.   Review of Systems  Constitutional: Positive for fever, chills and fatigue.  HENT: Positive for congestion and rhinorrhea. Negative for ear pain and neck stiffness.   Respiratory: Positive for cough and wheezing.   Cardiovascular: Negative for chest pain.  Gastrointestinal: Negative for abdominal pain.  Skin: Negative for rash.       Objective:   Physical Exam     Patient is alert and in no distress. Frequently coughing during exam.  Oropharynx is moist and no erythema or exudate  Eardrums normal  Neck supple no nodes  Chest diffuse wheezes. No rales. Symmetric breath sounds. Pulse oximetry 96%  Heart regular rhythm and rate  Extremities no edema  Skin no rash    Assessment & Plan:   asthmatic bronchitis. Continue Mucinex. Start Zithromax. Depo-Medrol 80 mg given. Proair 2 puffs every 4-6 hours when necessary for wheezing. Hycodan syrup for severe coughing at night

## 2010-08-02 ENCOUNTER — Telehealth: Payer: Self-pay | Admitting: *Deleted

## 2010-08-02 DIAGNOSIS — J45909 Unspecified asthma, uncomplicated: Secondary | ICD-10-CM

## 2010-08-02 MED ORDER — HYDROCODONE-HOMATROPINE 5-1.5 MG/5ML PO SYRP: 5.0000 mL | ORAL_SOLUTION | Freq: Four times a day (QID) | ORAL | Status: DC | PRN

## 2010-08-02 NOTE — Telephone Encounter (Signed)
Pt informed Rx called in, may try 1/2 tsp as needed for cough as it improves

## 2010-08-02 NOTE — Telephone Encounter (Signed)
May refill once. 

## 2010-08-02 NOTE — Telephone Encounter (Signed)
Faxed request to fill cough syrup, last filled 07-28-2010.  Hydrocodone-Homatrophine Syrup, sig take 5 ml every 4-6 hours prn cough, disp 120 ml I spoke with pt, she is taking syrup all day every 4-6 hours as directed.  Cough is improved some

## 2010-08-30 ENCOUNTER — Other Ambulatory Visit: Payer: Self-pay | Admitting: Family Medicine

## 2010-08-30 LAB — CBC
HCT: 41.7 % (ref 36.0–46.0)
Hemoglobin: 14.3 g/dL (ref 12.0–15.0)
MCH: 31.8 pg (ref 26.0–34.0)
MCHC: 34.3 g/dL (ref 30.0–36.0)
MCV: 92.7 fL (ref 78.0–100.0)
Platelets: 211 10*3/uL (ref 150–400)
RBC: 4.5 MIL/uL (ref 3.87–5.11)
RDW: 12.9 % (ref 11.5–15.5)
WBC: 4.8 10*3/uL (ref 4.0–10.5)

## 2010-08-30 LAB — BASIC METABOLIC PANEL
BUN: 13 mg/dL (ref 6–23)
CO2: 31 mEq/L (ref 19–32)
Calcium: 9.5 mg/dL (ref 8.4–10.5)
Chloride: 105 mEq/L (ref 96–112)
Creatinine, Ser: 0.87 mg/dL (ref 0.4–1.2)
GFR calc Af Amer: >60 mL/min (ref >60)
GFR calc non Af Amer: >60 mL/min (ref >60)
Glucose, Bld: 94 mg/dL (ref 70–99)
Potassium: 5 mEq/L (ref 3.5–5.1)
Sodium: 140 mEq/L (ref 135–145)

## 2010-09-18 ENCOUNTER — Emergency Department (INDEPENDENT_AMBULATORY_CARE_PROVIDER_SITE_OTHER): Payer: Self-pay

## 2010-09-18 ENCOUNTER — Emergency Department (HOSPITAL_BASED_OUTPATIENT_CLINIC_OR_DEPARTMENT_OTHER)
Admission: EM | Admit: 2010-09-18 | Discharge: 2010-09-18 | Disposition: A | Discharge status: Home / Self Care | Payer: Self-pay | Attending: Emergency Medicine | Admitting: Emergency Medicine

## 2010-09-18 DIAGNOSIS — H05019 Cellulitis of unspecified orbit: Secondary | ICD-10-CM | POA: Insufficient documentation

## 2010-09-18 DIAGNOSIS — H5710 Ocular pain, unspecified eye: Secondary | ICD-10-CM

## 2010-09-18 DIAGNOSIS — H5789 Other specified disorders of eye and adnexa: Secondary | ICD-10-CM | POA: Insufficient documentation

## 2010-09-18 LAB — CBC
HCT: 40.4 % (ref 36.0–46.0)
Hemoglobin: 14 g/dL (ref 12.0–15.0)
MCH: 31 pg (ref 26.0–34.0)
MCHC: 34.7 g/dL (ref 30.0–36.0)
MCV: 89.6 fL (ref 78.0–100.0)
Platelets: 203 10*3/uL (ref 150–400)
RBC: 4.51 MIL/uL (ref 3.87–5.11)
RDW: 12.8 % (ref 11.5–15.5)
WBC: 7.5 10*3/uL (ref 4.0–10.5)

## 2010-09-18 LAB — DIFFERENTIAL
Basophils Absolute: 0 10*3/uL (ref 0.0–0.1)
Basophils Relative: 0 % (ref 0–1)
Eosinophils Absolute: 0.1 10*3/uL (ref 0.0–0.7)
Eosinophils Relative: 2 % (ref 0–5)
Lymphocytes Relative: 21 % (ref 12–46)
Lymphs Abs: 1.6 10*3/uL (ref 0.7–4.0)
Monocytes Absolute: 0.9 10*3/uL (ref 0.1–1.0)
Monocytes Relative: 12 % (ref 3–12)
Neutro Abs: 4.8 10*3/uL (ref 1.7–7.7)
Neutrophils Relative %: 64 % (ref 43–77)

## 2010-09-18 LAB — BASIC METABOLIC PANEL
BUN: 20 mg/dL (ref 6–23)
CO2: 28 mEq/L (ref 19–32)
Calcium: 9.1 mg/dL (ref 8.4–10.5)
Chloride: 104 mEq/L (ref 96–112)
Creatinine, Ser: 1 mg/dL (ref 0.4–1.2)
GFR calc Af Amer: >60 mL/min (ref >60)
GFR calc non Af Amer: 59 mL/min — ABNORMAL LOW (ref >60)
Glucose, Bld: 94 mg/dL (ref 70–99)
Potassium: 3.9 mEq/L (ref 3.5–5.1)
Sodium: 143 mEq/L (ref 135–145)

## 2010-09-18 MED ORDER — IOHEXOL 350 MG/ML SOLN
100.0000 mL | Freq: Once | INTRAVENOUS | Status: AC | PRN
  Administered 2010-09-18: 100 mL via INTRAVENOUS

## 2010-09-19 NOTE — Op Note (Signed)
  Emily [name], Emily [name]               ACCOUNT NO.:  [id number]  MEDICAL RECORD NO.:  [id number]          PATIENT TYPE:  AMB  LOCATION:  SDS                          FACILITY:  [facility]  PHYSICIAN:  Chalmers Guest, M.D.     DATE OF BIRTH:  [birth year]  DATE OF PROCEDURE:  04/28/2010 DATE OF DISCHARGE:                              OPERATIVE REPORT   PREOPERATIVE DIAGNOSIS:  Visually significant cataract, hemianopia and glaucoma, right eye.  POSTOPERATIVE DIAGNOSIS:  Visually significant cataract, hemianopia and glaucoma, right eye.  PROCEDURE:  Phacoemulsification with intraocular lens implant.  COMPLICATIONS:  None.  ANESTHESIA:  Consisted of 2% Xylocaine with epinephrine in a 50:50 mixture of 0.75% Marcaine with ampule of Wydase.  PROCEDURE IN DETAIL:  The patient was given a peribulbar block with the aforementioned local anesthetic agent under monitored anesthesia in an operative suite.  Pressure was applied to the eye.  Following this, the patient's face was prepped and draped in usual sterile fashion with surgeon sitting temporally in the operating microscope in position.  A 15-degree blade was used to enter through clear cornea at the 11 o'clock position and Viscoat was injected.  Following this, using a Weck-Cel sponge to fixate the eye, a 2.75-mm keratome blade was used in a stepwise fashion through temporal clear cornea to again enter the anterior chamber.  A Bent 25 gauge needle was used to incise the anterior capsule and a curvilinear 4.5-mm capsulorhexis was formed.  BSS was used to hydrodissect and hydrodelineate the nucleus.  Following this, phacoemulsification unit was then used to sculpt the nucleus and then the nucleus was snapped into four quadrants using a snapping technique.  It was noted that there was reversed pupillary block present and the patient felt some pain at this point, therefore preservative- free Xylocaine was injected intraocular following  this. Phacoemulsification unit was then used to remove all nuclear fragments from the eye.  The IA was then used to strip cortical fibers from the posterior capsule.  All cortical fibers were removed with a posterior capsule intact.  Sub-incisional cortex had to be removed after intraocular lens implant was placed.  The intraocular lens was examined and noted to have no defects.  It was an alkaline aqueous soft lens. The model #SN60WF 10.0 diopter lens was placed in ordinal lens, spring- loaded lens, injector, and injected into the capsular bag.  The lens unfolded and was positioned with a Cabin hook.  Subincisional cortex was all removed and the eye was used to remove viscoelastic which progressed at this point from the eye.  Following this, the eye was pressurized and a 10-0 nylon suture was placed.  There was no leakage. Therefore, all instrumentation was removed from the eye and TobraDex ointment was applied to the eye.  A patch and Fox shield were placed and the patient returned to recovery area in stable condition.     Chalmers Guest, M.D.     RW/MEDQ  D:  04/28/2010  T:  04/29/2010  Job:  [id number]  Electronically Signed by Chalmers Guest M.D. on 09/19/2010 01:39:34 PM

## 2011-05-06 ENCOUNTER — Other Ambulatory Visit: Payer: Self-pay | Admitting: Family Medicine

## 2011-05-08 ENCOUNTER — Emergency Department (HOSPITAL_COMMUNITY)
Admission: EM | Admit: 2011-05-08 | Discharge: 2011-05-08 | Disposition: A | Discharge status: Home / Self Care | Payer: Worker's Compensation | Attending: Emergency Medicine | Admitting: Emergency Medicine

## 2011-05-08 ENCOUNTER — Encounter (HOSPITAL_COMMUNITY): Payer: Self-pay | Admitting: *Deleted

## 2011-05-08 ENCOUNTER — Emergency Department (HOSPITAL_COMMUNITY): Payer: Worker's Compensation

## 2011-05-08 DIAGNOSIS — S93609A Unspecified sprain of unspecified foot, initial encounter: Secondary | ICD-10-CM | POA: Insufficient documentation

## 2011-05-08 DIAGNOSIS — S20219A Contusion of unspecified front wall of thorax, initial encounter: Secondary | ICD-10-CM | POA: Insufficient documentation

## 2011-05-08 DIAGNOSIS — S93409A Sprain of unspecified ligament of unspecified ankle, initial encounter: Secondary | ICD-10-CM | POA: Insufficient documentation

## 2011-05-08 DIAGNOSIS — W19XXXA Unspecified fall, initial encounter: Secondary | ICD-10-CM

## 2011-05-08 DIAGNOSIS — R609 Edema, unspecified: Secondary | ICD-10-CM | POA: Insufficient documentation

## 2011-05-08 DIAGNOSIS — S93401A Sprain of unspecified ligament of right ankle, initial encounter: Secondary | ICD-10-CM

## 2011-05-08 DIAGNOSIS — W010XXA Fall on same level from slipping, tripping and stumbling without subsequent striking against object, initial encounter: Secondary | ICD-10-CM | POA: Insufficient documentation

## 2011-05-08 DIAGNOSIS — S93602A Unspecified sprain of left foot, initial encounter: Secondary | ICD-10-CM

## 2011-05-08 MED ORDER — OXYCODONE-ACETAMINOPHEN 5-325 MG PO TABS
1.0000 | ORAL_TABLET | Freq: Once | ORAL | Status: AC
  Administered 2011-05-08: 1 via ORAL
  Filled 2011-05-08: qty 1

## 2011-05-08 MED ORDER — OXYCODONE-ACETAMINOPHEN 5-325 MG PO TABS: 1.0000 | ORAL_TABLET | ORAL | Status: AC | PRN

## 2011-05-08 NOTE — ED Provider Notes (Signed)
History     CSN: [id number] Arrival date & time: 05/08/2011 11:50 AM   First MD Initiated Contact with Patient 05/08/11 1222      Chief Complaint  Patient presents with  . Ankle Pain    fall    (Consider location/radiation/quality/duration/timing/severity/associated sxs/prior treatment) HPI History provided by pt.   Pt was walking on sidewalk, heard her name called, right ankle twisted as she was turning around and caused her to fall.  Witness present in triage and told nursing staff that pt landed on her right side.  Did not hit head.  Pt denies headache, dizziness, blurred vision, confusion, N/V.  C/o pain of right lateral chest w/out SOB, as well as pain bilateral feet.  No neck/back pain.  Has not attempted to walk.   Past Medical History  Diagnosis Date  . Depression   . Eating disorder   . Glaucoma   . Anxiety   . Hiatal hernia   . History of cardiovascular stress test 04/2000    cardiolyte  . History of flexible sigmoidoscopy 09/21/1999  . Cancer 2009    squamous cell skin    Past Surgical History  Procedure Date  . Cardiac catheterization 04/2000  . Upper gastrointestinal endoscopy 09/15/1999  . Abdominal hysterectomy   . Appendectomy     No family history on file.  History  Substance Use Topics  . Smoking status: Former Smoker -- 0.5 packs/day for 20 years    Types: Cigarettes    Quit date: 05/27/2008  . Smokeless tobacco: Not on file  . Alcohol Use: Not on file    OB History    Grav Para Term Preterm Abortions TAB SAB Ect Mult Living                  Review of Systems  All other systems reviewed and are negative.    Allergies  Penicillins and Prednisone  Home Medications   Current Outpatient Rx  Name Route Sig Dispense Refill  . ALPRAZOLAM 1 MG PO TABS Oral Take 1 mg by mouth 3 (three) times daily as needed. For anxiety.    [name] BIMATOPROST 0.03 % OP SOLN Both Eyes Place 1 drop into both eyes daily.      [name] BRIMONIDINE TARTRATE-TIMOLOL 0.2-0.5  % OP SOLN Right Eye Place 1 drop into the right eye 2 (two) times daily.      [name] BRINZOLAMIDE 1 % OP SUSP Both Eyes Place 1 drop into both eyes 2 (two) times daily.     . COLCHICINE 0.6 MG PO TABS Oral Take 0.6 mg by mouth daily as needed. For gout flareups.     . FUROSEMIDE 20 MG PO TABS Oral Take 20 mg by mouth daily.     . TRAZODONE HCL 100 MG PO TABS Oral Take 300 mg by mouth at bedtime.     . OXYCODONE-ACETAMINOPHEN 5-325 MG PO TABS Oral Take 1 tablet by mouth every 4 (four) hours as needed for pain. 20 tablet 0    BP 118/66  Pulse 71  Temp(Src) 98.9 F (37.2 C) (Oral)  Resp 16  Ht 5\' 7"  (1.702 m)  Wt 214 lb (97.07 kg)  BMI 33.52 kg/m2  SpO2 98%  Physical Exam  Nursing note and vitals reviewed. Constitutional: She is oriented to person, place, and time. She appears well-developed and well-nourished. No distress.  HENT:  Head: Normocephalic and atraumatic.       Head non-tender.  Eyes:       Normal  appearance  Neck: Normal range of motion.  Cardiovascular: Normal rate and regular rhythm.   Pulmonary/Chest: Effort normal and breath sounds normal.       Right lateral chest ttp.  No ecchymosis or other skin changes.   Musculoskeletal:       No deformity of ankles/feet.  Edema dorsal surface of both feet.  No ecchymosis or other skin changes.  Tenderness of 1-3rd left metatarsals and pain w/ passive ROM 1-3rd left toes, as well as foot inversion/eversion.  Right foot and left/right ankles non-tender.  Full ROM both ankles w/out pain.  2+ DP pulses and sensation intact bilaterally.  Abrasion of left knee.  Non-tender and non-painful ROM.  Right shoulder non-tender and full, non-painful ROM.  Entire spine non-tender and no pain w/ head rotation.  Neurological: She is alert and oriented to person, place, and time.  Skin: Skin is warm and dry. No rash noted.  Psychiatric: She has a normal mood and affect. Her behavior is normal.    ED Course  Procedures (including critical care  time)  Labs Reviewed - No data to display Dg Ribs Unilateral W/chest Right  05/08/2011  *RADIOLOGY REPORT*  Clinical Data: Pain post fall  RIGHT RIBS AND CHEST - 3+ VIEW  Comparison: 06/14/2007  Findings: 4 views right ribs submitted.  The cardiomediastinal silhouette is stable. No acute infiltrate or pleural effusion.  No pulmonary edema.  No right rib fracture is identified.  No diagnostic pneumothorax.  IMPRESSION: No acute disease.  No right rib fracture is identified.  No diagnostic pneumothorax.  Original Report Authenticated By: Natasha Mead, M.D.   Dg Foot Complete Left  05/08/2011  *RADIOLOGY REPORT*  Clinical Data: Status post fall.  Foot pain.  LEFT FOOT - COMPLETE 3+ VIEW  Comparison: None.  Findings: There are mild degenerative changes of the first metatarsal phalangeal joint with small dorsal osteophytes. The mineralization and alignment are normal.  There is no evidence of acute fracture or dislocation.  No soft tissue abnormalities are identified.  IMPRESSION: No acute osseous findings.  Original Report Authenticated By: Gerrianne Scale, M.D.   Dg Foot Complete Right  05/08/2011  *RADIOLOGY REPORT*  Clinical Data: Status post fall.  Foot pain.  RIGHT FOOT COMPLETE - 3+ VIEW  Comparison: None.  Findings: There are degenerative changes of the first metatarsal phalangeal joint with dorsal osteophytes. The mineralization and alignment are normal.  There is no evidence of acute fracture or dislocation.  No soft tissue abnormalities are identified.  IMPRESSION: No acute osseous findings.  Original Report Authenticated By: Gerrianne Scale, M.D.     1. Fall   2. Sprain of right ankle   3. Sprain of left foot   4. Contusion of chest wall       MDM  Pt presents w/ mechanical fall onto right side.  Did not hit head.  C/o pain in right chest which is reproducible w/ palpation on exam, and bilateral foot pain.  Was able to bear weight on right foot on exam, though ankle painful; unable  to bear weight on left foot.  Doubt right ankle fx because non-tender and no pain w/ ROM.  Xrays right ribs and bilateral feet neg.  Pt received an incentive spirometer.  Ortho tech placed in right ASO and left cam walker.  Received one percocet w/ relief of pain in ED and discharged home w/ same.         Emily [name] [place], [place] 05/08/11 2032

## 2011-05-08 NOTE — ED Notes (Signed)
Patient given discharge instructions, information, prescriptions, and diet order. Patient states that they adequately understand discharge information given and to return to ED if symptoms return or worsen.    Patient given information about medications and outpatient pharmacy

## 2011-05-08 NOTE — ED Notes (Signed)
MD at bedside. 

## 2011-05-08 NOTE — ED Notes (Signed)
OT here to put patients in CAM walker and ASO ankle brace. OT and PA communicated to be sure of pt care plan.

## 2011-05-08 NOTE — ED Notes (Signed)
OT called for brace.

## 2011-05-08 NOTE — ED Notes (Signed)
Pt states that she was walking this morning on the sidewalk outside of work and fell after someone caught her attention. Patient states that she does not remember hitting her head of any LOC. Patient states that she could not open her eyes after she hit the ground for a few seconds. She states that she can not move the toes on her left foot, dorsiflex is present. States that she can move the toes on her right foot, dorsiflex is present, extension present. Swelling present in both BLE. Patient complaining of shoulder pain from her fall onto her right side. ROM intact. Abrasions to bilateral knees, bleeding controlled.

## 2011-05-11 NOTE — ED Provider Notes (Signed)
Medical screening examination/treatment/procedure(s) were performed by non-physician practitioner and as supervising physician I was immediately available for consultation/collaboration.   Forbes Cellar, MD 05/11/11 [phone]

## 2011-05-15 ENCOUNTER — Ambulatory Visit (INDEPENDENT_AMBULATORY_CARE_PROVIDER_SITE_OTHER)
Admission: RE | Admit: 2011-05-15 | Discharge: 2011-05-15 | Disposition: A | Discharge status: Home / Self Care | Payer: Worker's Compensation | Source: Ambulatory Visit | Attending: Internal Medicine | Admitting: Internal Medicine

## 2011-05-15 ENCOUNTER — Ambulatory Visit (INDEPENDENT_AMBULATORY_CARE_PROVIDER_SITE_OTHER): Payer: Worker's Compensation | Admitting: Internal Medicine

## 2011-05-15 ENCOUNTER — Encounter: Payer: Self-pay | Admitting: Internal Medicine

## 2011-05-15 VITALS — BP 120/80 | HR 94 | Temp 98.6°F

## 2011-05-15 DIAGNOSIS — R0602 Shortness of breath: Secondary | ICD-10-CM

## 2011-05-15 DIAGNOSIS — S93409A Sprain of unspecified ligament of unspecified ankle, initial encounter: Secondary | ICD-10-CM

## 2011-05-15 DIAGNOSIS — S298XXA Other specified injuries of thorax, initial encounter: Secondary | ICD-10-CM

## 2011-05-15 DIAGNOSIS — S93602A Unspecified sprain of left foot, initial encounter: Secondary | ICD-10-CM

## 2011-05-15 DIAGNOSIS — S299XXA Unspecified injury of thorax, initial encounter: Secondary | ICD-10-CM

## 2011-05-15 DIAGNOSIS — Z9181 History of falling: Secondary | ICD-10-CM

## 2011-05-15 DIAGNOSIS — S93609A Unspecified sprain of unspecified foot, initial encounter: Secondary | ICD-10-CM

## 2011-05-15 NOTE — Patient Instructions (Signed)
Will notify you  of c ray  when available. Rec ortho  Consult because of the bilateral injury and ambulation difficulties.

## 2011-05-15 NOTE — Progress Notes (Signed)
  Subjective:    Patient ID: Emily [name], female    DOB: [birth year], 60 y.o.   MRN: [id number]  HPI Patient comes in today for SDA  For acute problem evaluation. Comes with husband . But his is really a fu from ed and problem is continuing  . PCP NA today Had and injury WC visit  Walking and boss talked to her and she twisted and fell and taken to hospital from [employer] on right side  .  Was dazed but no head trauma.was given boot for left foot  Happened a bout a week ago  Was given  ankle support right   Walking with walker and by her self.  Boot left .  Right foot sweolln no bleeding  Last few days pain in chest getting worse  Had been given incentive spirometry .    Hurts worse.   Feels something  [place] / mucous. No fever  Cough some.  Some predated uri.   No hx of asthma  resp disease  No recent  panic attack.    Review of Systems NO fever cough wheeze other swelling gi gu issues no bleeding  Rest as per hpi  Objective:   Physical Exam  WDWN in nad with LE support right and boot left  Removed for exam HEENT: Normocephalic ;atraumatic , Eyes;  PERRL, EOMs  Full, lids and conjunctiva clear,,Ears: no deformities, canals nl, TM landmarks normal, Nose: no deformity or discharge  Mouth : OP clear without lesion or edema . Neck: Supple without adenopathy or masses or bruits Chest:  Clear to A&P without wheezes rales or rhonchi CV:  S1-S2 no gallops or murmurs peripheral perfusion is normal CW tender right latera CW no crepitus  and no bruising  EXT right ankle 2 + swelling lateral tenderness and no bony tenderness some bruising faded  Left foot some swelling top of foot no bony tenderness  NV seem intact     Assessment & Plan:  SOB  prob from cw injury   And soft tissue  Nl plm exam   Get c xray  This is prob from the pain injury in cw   Bilateral  Foot/ ankle injury  Right ankle; and left foot no obv bony injury  Refer to ortho but will have to call her WC  And co to  get referralbecaue this is a WC case   Total visit > 50% spent counseling and coordinating care

## 2011-05-16 ENCOUNTER — Other Ambulatory Visit: Payer: Self-pay | Admitting: Family Medicine

## 2011-05-16 ENCOUNTER — Telehealth: Payer: Self-pay | Admitting: Family Medicine

## 2011-05-16 NOTE — Telephone Encounter (Signed)
Pt requesting results of x-rays

## 2011-05-16 NOTE — Telephone Encounter (Signed)
Pt aware of results 

## 2011-05-16 NOTE — Telephone Encounter (Signed)
Left message to call back  

## 2011-05-18 DIAGNOSIS — S299XXA Unspecified injury of thorax, initial encounter: Secondary | ICD-10-CM

## 2011-05-18 DIAGNOSIS — S93409A Sprain of unspecified ligament of unspecified ankle, initial encounter: Secondary | ICD-10-CM

## 2011-05-18 DIAGNOSIS — S93602A Unspecified sprain of left foot, initial encounter: Secondary | ICD-10-CM | POA: Insufficient documentation

## 2011-05-18 HISTORY — DX: Unspecified injury of thorax, initial encounter: S29.9XXA

## 2011-05-18 HISTORY — DX: Sprain of unspecified ligament of unspecified ankle, initial encounter: S93.409A

## 2011-05-18 HISTORY — DX: Unspecified sprain of left foot, initial encounter: S93.602A

## 2011-11-02 ENCOUNTER — Ambulatory Visit (INDEPENDENT_AMBULATORY_CARE_PROVIDER_SITE_OTHER): Payer: Worker's Compensation | Admitting: Family Medicine

## 2011-11-02 ENCOUNTER — Encounter: Payer: Self-pay | Admitting: Family Medicine

## 2011-11-02 DIAGNOSIS — R079 Chest pain, unspecified: Secondary | ICD-10-CM

## 2011-11-02 DIAGNOSIS — R5383 Other fatigue: Secondary | ICD-10-CM

## 2011-11-02 DIAGNOSIS — R635 Abnormal weight gain: Secondary | ICD-10-CM

## 2011-11-02 DIAGNOSIS — R5381 Other malaise: Secondary | ICD-10-CM

## 2011-11-02 DIAGNOSIS — R200 Anesthesia of skin: Secondary | ICD-10-CM

## 2011-11-02 DIAGNOSIS — R209 Unspecified disturbances of skin sensation: Secondary | ICD-10-CM

## 2011-11-02 DIAGNOSIS — R0789 Other chest pain: Secondary | ICD-10-CM

## 2011-11-02 LAB — BASIC METABOLIC PANEL
BUN: 17 mg/dL (ref 6–23)
CO2: 25 mEq/L (ref 19–32)
Chloride: 103 mEq/L (ref 96–112)
Glucose, Bld: 75 mg/dL (ref 70–99)
Potassium: 3.9 mEq/L (ref 3.5–5.1)

## 2011-11-02 NOTE — Patient Instructions (Signed)
Follow up immediately for any recurrent chest pain 

## 2011-11-02 NOTE — Progress Notes (Signed)
  Subjective:    Patient ID: Emily [name], female    DOB: 11-24-1950, 61 y.o.   MRN: [id number]  HPI  Patient seen with atypical chest pain this morning at work at rest. She described initially noticing some right jaw pain followed by some very transient right face numbness but no weakness. No speech changes. She had about 4 minutes of some sharp right-sided substernal chest pain. No left-sided symptoms. Denies any dyspnea, nausea, or vomiting. She's been walking recently for exercise with no associated chest pain. She denies any headaches, visual changes, speech changes, or focal weakness.  Patient had nuclear stress test 2001 unremarkable. She is ex-smoker. No family history of premature CAD. No history of diabetes. No history of hypertension. Has had some recent weight gain. Not exercising much. Increased fatigue.  Past Medical History  Diagnosis Date  . Depression   . Eating disorder   . Glaucoma   . Anxiety   . Hiatal hernia   . History of cardiovascular stress test 04/2000    cardiolyte  . History of flexible sigmoidoscopy 09/21/1999  . Cancer 2009    squamous cell skin   Past Surgical History  Procedure Date  . Cardiac catheterization 04/2000  . Upper gastrointestinal endoscopy 09/15/1999  . Abdominal hysterectomy   . Appendectomy     reports that she quit smoking about 3 years ago. Her smoking use included Cigarettes. She has a 10 pack-year smoking history. She does not have any smokeless tobacco history on file. Her alcohol and drug histories not on file. family history is not on file. Allergies  Allergen Reactions  . Penicillins     REACTION: rash  . Prednisone     REACTION: agressive behavior     Review of Systems  Constitutional: Positive for fatigue and unexpected weight change. Negative for appetite change.  Respiratory: Negative for cough and shortness of breath.   Cardiovascular: Positive for chest pain.  Gastrointestinal: Negative for abdominal pain.    Genitourinary: Negative for dysuria.  Neurological: Negative for dizziness, seizures, syncope, weakness and headaches.  Hematological: Negative for adenopathy. Does not bruise/bleed easily.  Psychiatric/Behavioral: Negative for dysphoric mood.       Objective:   Physical Exam  Constitutional: She is oriented to person, place, and time. She appears well-developed and well-nourished. No distress.  HENT:  Mouth/Throat: Oropharynx is clear and moist.  Neck: Neck supple. No thyromegaly present.  Cardiovascular: Normal rate and regular rhythm.   Pulmonary/Chest: Effort normal and breath sounds normal. No respiratory distress. She has no wheezes. She has no rales.  Musculoskeletal: She exhibits no edema.  Lymphadenopathy:    She has no cervical adenopathy.  Neurological: She is alert and oriented to person, place, and time. She has normal reflexes. No cranial nerve deficit.          Assessment & Plan:  #1 atypical chest pain. EKG reveals no acute changes. She is asymptomatic at this time. Followup promptly if she has recurrent symptoms. We discussed recurrent stress testing but still yield would be low this time. She has fairly low risk factors other than age and sedentary lifestyle.  #2 weight gain and fatigue. Check TSH and basic metabolic panel.  #3 transient right facial numbness without any weakness. Nonfocal exam this time. Follow up immediately for any recurrent symptoms.

## 2011-11-03 NOTE — Progress Notes (Signed)
Quick Note:  Pt informed on personally identified VM ______ 

## 2012-03-24 ENCOUNTER — Emergency Department (HOSPITAL_COMMUNITY)
Admission: EM | Admit: 2012-03-24 | Discharge: 2012-03-24 | Disposition: A | Discharge status: Home / Self Care | Payer: Self-pay | Attending: Emergency Medicine | Admitting: Emergency Medicine

## 2012-03-24 ENCOUNTER — Encounter (HOSPITAL_COMMUNITY): Payer: Self-pay | Admitting: Emergency Medicine

## 2012-03-24 DIAGNOSIS — Z85828 Personal history of other malignant neoplasm of skin: Secondary | ICD-10-CM | POA: Insufficient documentation

## 2012-03-24 DIAGNOSIS — Z87891 Personal history of nicotine dependence: Secondary | ICD-10-CM | POA: Insufficient documentation

## 2012-03-24 DIAGNOSIS — L0201 Cutaneous abscess of face: Secondary | ICD-10-CM | POA: Insufficient documentation

## 2012-03-24 DIAGNOSIS — L03211 Cellulitis of face: Secondary | ICD-10-CM | POA: Insufficient documentation

## 2012-03-24 DIAGNOSIS — F411 Generalized anxiety disorder: Secondary | ICD-10-CM | POA: Insufficient documentation

## 2012-03-24 MED ORDER — ONDANSETRON HCL 4 MG PO TABS
4.0000 mg | ORAL_TABLET | Freq: Once | ORAL | Status: AC
  Administered 2012-03-24: 4 mg via ORAL
  Filled 2012-03-24: qty 1

## 2012-03-24 MED ORDER — SULFAMETHOXAZOLE-TMP DS 800-160 MG PO TABS
1.0000 | ORAL_TABLET | Freq: Once | ORAL | Status: AC
  Administered 2012-03-24: 1 via ORAL
  Filled 2012-03-24: qty 1

## 2012-03-24 MED ORDER — SULFAMETHOXAZOLE-TRIMETHOPRIM 800-160 MG PO TABS: 1.0000 | ORAL_TABLET | Freq: Two times a day (BID) | ORAL | Status: DC

## 2012-03-24 NOTE — ED Notes (Signed)
Pt alert and oriented x4. Respirations even and unlabored, bilateral symmetrical rise and fall of chest. Skin warm and dry. In no acute distress. Denies needs.   

## 2012-03-24 NOTE — ED Notes (Signed)
Swelling around left eye started Friday, small bump with white area, was draining pus earlier.

## 2012-03-24 NOTE — ED Notes (Signed)
MD at bedside. 

## 2012-03-26 ENCOUNTER — Ambulatory Visit (INDEPENDENT_AMBULATORY_CARE_PROVIDER_SITE_OTHER): Payer: Self-pay | Admitting: Family Medicine

## 2012-03-26 ENCOUNTER — Encounter: Payer: Self-pay | Admitting: Family Medicine

## 2012-03-26 VITALS — BP 106/72 | HR 93 | Temp 98.0°F | Wt 240.0 lb

## 2012-03-26 DIAGNOSIS — H00039 Abscess of eyelid unspecified eye, unspecified eyelid: Secondary | ICD-10-CM

## 2012-03-26 DIAGNOSIS — L03213 Periorbital cellulitis: Secondary | ICD-10-CM

## 2012-03-26 DIAGNOSIS — L0292 Furuncle, unspecified: Secondary | ICD-10-CM

## 2012-03-26 DIAGNOSIS — L0293 Carbuncle, unspecified: Secondary | ICD-10-CM

## 2012-03-26 MED ORDER — DOXYCYCLINE HYCLATE 100 MG PO CAPS: 100.0000 mg | ORAL_CAPSULE | Freq: Two times a day (BID) | ORAL | Status: AC

## 2012-03-26 MED ORDER — CLINDAMYCIN HCL 300 MG PO CAPS: 300.0000 mg | ORAL_CAPSULE | Freq: Four times a day (QID) | ORAL | Status: DC

## 2012-03-26 NOTE — Progress Notes (Signed)
  Subjective:    Patient ID: Emily [name], female    DOB: 25-Jan-1951, 61 y.o.   MRN: [id number]  HPI Here for 5 days of redness, swelling and pain around the left eye. No fever. She was seen at the ER 2 days ago and diagnosed with cellulitis. Started on Septra DS but the area is getting worse. No vision problems.    Review of Systems  Constitutional: Positive for diaphoresis.  HENT: Negative.   Eyes: Negative.   Skin: Positive for rash.       Objective:   Physical Exam  Constitutional: She appears well-developed and well-nourished.  HENT:       There is a red macular rash around the nasal side of the left orbit which extends over the bridge of the nose. There is a small boil on the left side of the left orbit  Eyes: Conjunctivae normal are normal. Pupils are equal, round, and reactive to light. Right eye exhibits no discharge. Left eye exhibits no discharge.  Lymphadenopathy:    She has no cervical adenopathy.          Assessment & Plan:  Periorbital cellulitis. The boil was lanced and a moderate amount of purulent material was expressed. Immediately her pain was improved. A  Sample was sent for culture. Stop the Septra and begin 10 days of Doxycycline and Clindamycin. Use warm compresses. Recheck later this week

## 2012-03-29 LAB — WOUND CULTURE: Gram Stain: NONE SEEN

## 2012-03-29 NOTE — ED Provider Notes (Signed)
History    61year-old female with a rash to her nose. Initially thought was irritation from her glasses. Developed a small bump to left side of her nose in the past day has had some surrounding redness. Mildly painful and itching. No visual complaints. No fevers or chills. No history of diabetes.  CSN: [id number]  Arrival date & time 03/24/12  1737   First MD Initiated Contact with Patient 03/24/12 1809      Chief Complaint  Patient presents with  . swelling around left eye     (Consider location/radiation/quality/duration/timing/severity/associated sxs/prior treatment) HPI  Past Medical History  Diagnosis Date  . Depression   . Eating disorder   . Glaucoma   . Anxiety   . Hiatal hernia   . History of cardiovascular stress test 04/2000    cardiolyte  . History of flexible sigmoidoscopy 09/21/1999  . Cancer 2009    squamous cell skin    Past Surgical History  Procedure Date  . Cardiac catheterization 04/2000  . Upper gastrointestinal endoscopy 09/15/1999  . Abdominal hysterectomy   . Appendectomy   . Colonoscopy 12/31/09    Family History  Problem Relation Age of Onset  . Arthritis    . Cancer      lung  . Coronary artery disease    . Stroke      History  Substance Use Topics  . Smoking status: Former Smoker -- 0.5 packs/day for 20 years    Types: Cigarettes    Quit date: 05/27/2008  . Smokeless tobacco: Never Used  . Alcohol Use: Yes     twice a month    OB History    Grav Para Term Preterm Abortions TAB SAB Ect Mult Living                  Review of Systems   Review of symptoms negative unless otherwise noted in HPI.   Allergies  Penicillins and Prednisone  Home Medications   Current Outpatient Rx  Name Route Sig Dispense Refill  . ALPRAZOLAM 1 MG PO TABS Oral Take 1 mg by mouth 3 (three) times daily as needed. For anxiety.    [name] BIMATOPROST 0.03 % OP SOLN Both Eyes Place 1 drop into both eyes daily.      [name] BRIMONIDINE TARTRATE-TIMOLOL  0.2-0.5 % OP SOLN Right Eye Place 1 drop into the right eye 2 (two) times daily.      [name] BRINZOLAMIDE 1 % OP SUSP Both Eyes Place 1 drop into both eyes 2 (two) times daily.     . BUPROPION HCL ER (XL) 150 MG PO TB24 Oral Take 150 mg by mouth daily.      [name] CITALOPRAM HYDROBROMIDE 10 MG PO TABS Oral Take 10 mg by mouth daily. 1 and 1/2 daily     . COLCHICINE 0.6 MG PO TABS Oral Take 0.6 mg by mouth daily as needed. For gout flareups.     . FUROSEMIDE 20 MG PO TABS Oral Take 10 mg by mouth daily. Take 1/2 tablets but mouth    . TRAZODONE HCL 100 MG PO TABS Oral Take 300 mg by mouth at bedtime.     [name] CLINDAMYCIN HCL 300 MG PO CAPS Oral Take 1 capsule (300 mg total) by mouth 4 (four) times daily. 40 capsule 0  . DOXYCYCLINE HYCLATE 100 MG PO CAPS Oral Take 1 capsule (100 mg total) by mouth 2 (two) times daily. 20 capsule 0    BP 127/60  Pulse 63  Temp 97.7 F (36.5 C) (Oral)  Resp 18  SpO2 98%  Physical Exam  Nursing note and vitals reviewed. Constitutional: She appears well-developed and well-nourished. No distress.  HENT:  Head: Normocephalic and atraumatic.       Small lesion to the left side of the bridge of the nose. Less than 0.5cm in size. Tender to palpation. No fluctuance. No drainage. Mild surrounding erythema extending laterally.  Eyes: Conjunctivae normal are normal. Pupils are equal, round, and reactive to light. Right eye exhibits no discharge. Left eye exhibits no discharge.       Are clear. Pupils are equally round and reactive to light. Anterior chambers clear. No drainage. No proptosis. No pain with extraocular movement.  Neck: Neck supple.  Cardiovascular: Normal rate, regular rhythm and normal heart sounds.  Exam reveals no gallop and no friction rub.   No murmur heard. Pulmonary/Chest: Effort normal and breath sounds normal. No respiratory distress.  Abdominal: Soft. She exhibits no distension. There is no tenderness.  Musculoskeletal: She exhibits no edema and no  tenderness.  Neurological: She is alert.  Skin: Skin is warm and dry.  Psychiatric: She has a normal mood and affect. Her behavior is normal. Thought content normal.    ED Course  Procedures (including critical care time)  Labs Reviewed - No data to display No results found.   1. Facial cellulitis       MDM  61 year old female with a rash to the bridge of her nose. Possible  early cellulitis. Lesion but no drainable collection. Patient is afebrile. No history of diabetes. Feel she is appropriate for outpatient treatment at this time. Will prescribe a course of Bactrim. Return precautions were discussed. Outpatient followup otherwise.        Raeford Razor, MD 03/29/12 [phone]

## 2012-04-01 NOTE — Progress Notes (Signed)
Quick Note:  I tried to reach pt at home, no answer or option to leave a message. ______

## 2012-05-21 ENCOUNTER — Encounter: Payer: Self-pay | Admitting: Family Medicine

## 2012-05-21 ENCOUNTER — Ambulatory Visit (INDEPENDENT_AMBULATORY_CARE_PROVIDER_SITE_OTHER): Payer: BC Managed Care – PPO | Admitting: Family Medicine

## 2012-05-21 VITALS — BP 120/78 | HR 73 | Temp 98.5°F | Wt 238.0 lb

## 2012-05-21 DIAGNOSIS — J329 Chronic sinusitis, unspecified: Secondary | ICD-10-CM

## 2012-05-21 MED ORDER — AZITHROMYCIN 250 MG PO TABS: ORAL_TABLET | ORAL | Status: DC

## 2012-05-21 NOTE — Progress Notes (Signed)
Chief Complaint  Patient presents with  . URI    cough, congestion, low grade fever, mucus-green; sinus pressure x 10 days     HPI:  -started: 12 days ago -symptoms:nasal congestion, sore throat, maxillary and frontal sinus pain and pressure R > L, cough, stress with husband's health - feeling better today for the first time -denies:fever, SOB, NVD, tooth pain, ear pain -has tried: musinex, vicks severe cold and flu, dayquil, nyquil, advil, steam showers -sick contacts: husband -Hx of: no hx asthma, hx of sinus infections - usually z-pack works   ROS: See pertinent positives and negatives per HPI.  Past Medical History  Diagnosis Date  . Depression   . Eating disorder   . Glaucoma   . Anxiety   . Hiatal hernia   . History of cardiovascular stress test 04/2000    cardiolyte  . History of flexible sigmoidoscopy 09/21/1999  . Cancer 2009    squamous cell skin    Family History  Problem Relation Age of Onset  . Arthritis    . Cancer      lung  . Coronary artery disease    . Stroke      History   Social History  . Marital Status: Married    Spouse Name: N/A    Number of Children: N/A  . Years of Education: N/A   Social History Main Topics  . Smoking status: Former Smoker -- 0.5 packs/day for 20 years    Types: Cigarettes    Quit date: 05/27/2008  . Smokeless tobacco: Never Used  . Alcohol Use: Yes     Comment: twice a month  . Drug Use: No  . Sexually Active: None   Other Topics Concern  . None   Social History Narrative  . None    Current outpatient prescriptions:ALPRAZolam (XANAX) 1 MG tablet, Take 1 mg by mouth 3 (three) times daily as needed. For anxiety., Disp: , Rfl: ;  bimatoprost (LUMIGAN) 0.03 % ophthalmic drops, Place 1 drop into both eyes daily.  , Disp: , Rfl: ;  brimonidine-timolol (COMBIGAN) 0.2-0.5 % ophthalmic solution, Place 1 drop into the right eye 2 (two) times daily.  , Disp: , Rfl:  brinzolamide (AZOPT) 1 % ophthalmic suspension, Place  1 drop into both eyes 2 (two) times daily. , Disp: , Rfl: ;  buPROPion (WELLBUTRIN XL) 150 MG 24 hr tablet, Take 150 mg by mouth daily.  , Disp: , Rfl: ;  citalopram (CELEXA) 10 MG tablet, Take 10 mg by mouth daily. 1 and 1/2 daily , Disp: , Rfl: ;  clindamycin (CLEOCIN) 300 MG capsule, Take 1 capsule (300 mg total) by mouth 4 (four) times daily., Disp: 40 capsule, Rfl: 0 colchicine 0.6 MG tablet, Take 0.6 mg by mouth daily as needed. For gout flareups. , Disp: , Rfl: ;  furosemide (LASIX) 20 MG tablet, Take 10 mg by mouth daily. Take 1/2 tablets but mouth, Disp: , Rfl: ;  traZODone (DESYREL) 100 MG tablet, Take 300 mg by mouth at bedtime. , Disp: , Rfl: ;  azithromycin (ZITHROMAX) 250 MG tablet, 2 tabs the first day, then one tab daily, Disp: 6 tablet, Rfl: 0  EXAM:  Filed Vitals:   05/21/12 1523  BP: 120/78  Pulse: 73  Temp: 98.5 F (36.9 C)    There is no height on file to calculate BMI.  GENERAL: vitals reviewed and listed above, alert, oriented, appears well hydrated and in no acute distress  HEENT: atraumatic, conjunttiva clear, no  obvious abnormalities on inspection of external nose and ears, normal appearance of ear canals and TMs, clear nasal congestion, mild post oropharyngeal erythema with PND, no tonsillar edema or exudate, no sinus TTP  NECK: no obvious masses on inspection  LUNGS: clear to auscultation bilaterally, no wheezes, rales or rhonchi, good air movement  CV: HRRR, no peripheral edema  MS: moves all extremities without noticeable abnormality  PSYCH: pleasant and cooperative, no obvious depression or anxiety  ASSESSMENT AND PLAN:  Discussed the following assessment and plan:  1. Sinusitis  azithromycin (ZITHROMAX) 250 MG tablet   -symptoms for about 2 weeks with sinus pain - discussed abx reasonable - discussed risks/benefits - she has taken z-pak before and prefers this. -Patient advised to return or notify a doctor immediately if symptoms worsen or persist  or new concerns arise.  Patient Instructions  INSTRUCTIONS FOR UPPER RESPIRATORY INFECTION:  -plenty of rest and fluids  -As we discussed, we have prescribed a new medication (AZITHROMYCIN) for you at this appointment. We discussed the common and serious potential adverse effects of this medication and you can review these and more with the pharmacist when you pick up your medication.  Please follow the instructions for use carefully and notify us immediately if you have any problems taking this medication.  -nasal saline wash 2-3 times daily (use prepackaged nasal saline or bottled/distilled water if making your own)   -can use tylenol or ibuprofen as directed for aches and sorethroat  -in the winter time, using a humidifier at night is helpful (please follow cleaning instructions)  -if you are taking a cough medication - use only as directed, may also try a teaspoon of honey to coat the throat and throat lozenges  -for sore throat, salt water gargles can help  -follow up if you have fevers, facial pain, tooth pain, difficulty breathing or are worsening or not getting better in 5-7 days      Orlyn Odonoghue R.

## 2012-05-21 NOTE — Patient Instructions (Addendum)
INSTRUCTIONS FOR UPPER RESPIRATORY INFECTION:  -plenty of rest and fluids  -As we discussed, we have prescribed a new medication (AZITHROMYCIN) for you at this appointment. We discussed the common and serious potential adverse effects of this medication and you can review these and more with the pharmacist when you pick up your medication.  Please follow the instructions for use carefully and notify us immediately if you have any problems taking this medication.  -nasal saline wash 2-3 times daily (use prepackaged nasal saline or bottled/distilled water if making your own)   -can use tylenol or ibuprofen as directed for aches and sorethroat  -in the winter time, using a humidifier at night is helpful (please follow cleaning instructions)  -if you are taking a cough medication - use only as directed, may also try a teaspoon of honey to coat the throat and throat lozenges  -for sore throat, salt water gargles can help  -follow up if you have fevers, facial pain, tooth pain, difficulty breathing or are worsening or not getting better in 4-5 days

## 2012-11-01 ENCOUNTER — Ambulatory Visit (INDEPENDENT_AMBULATORY_CARE_PROVIDER_SITE_OTHER): Payer: BC Managed Care – PPO | Admitting: Family Medicine

## 2012-11-01 ENCOUNTER — Encounter: Payer: Self-pay | Admitting: Family Medicine

## 2012-11-01 VITALS — BP 120/88 | Temp 97.9°F | Wt 240.0 lb

## 2012-11-01 DIAGNOSIS — K130 Diseases of lips: Secondary | ICD-10-CM

## 2012-11-01 MED ORDER — FUROSEMIDE 20 MG PO TABS: 20.0000 mg | ORAL_TABLET | Freq: Every day | ORAL | Status: DC | PRN

## 2012-11-01 NOTE — Progress Notes (Signed)
  Subjective:    Patient ID: Emily [name], female    DOB: 03/28/1951, 62 y.o.   MRN: [id number]  HPI  Patient presents with cystic lesion left upper lip region for about 6 months. Slowly growing. No associated pain. No overlying skin changes. No appetite or weight changes. No adenopathy.  Peripheral edema and takes rare furosemide 20 mg. Requesting refill. She'll he has taken about 20 tablets of the past year total.  Past Medical History  Diagnosis Date  . Depression   . Eating disorder   . Glaucoma   . Anxiety   . Hiatal hernia   . History of cardiovascular stress test 04/2000    cardiolyte  . History of flexible sigmoidoscopy 09/21/1999  . Cancer 2009    squamous cell skin   Past Surgical History  Procedure Laterality Date  . Cardiac catheterization  04/2000  . Upper gastrointestinal endoscopy  09/15/1999  . Abdominal hysterectomy    . Appendectomy    . Colonoscopy  12/31/09    reports that she quit smoking about 4 years ago. Her smoking use included Cigarettes. She has a 10 pack-year smoking history. She has never used smokeless tobacco. She reports that  drinks alcohol. She reports that she does not use illicit drugs. family history includes Arthritis in an unspecified family member; Cancer in an unspecified family member; Coronary artery disease in an unspecified family member; and Stroke in an unspecified family member. Allergies  Allergen Reactions  . Penicillins     REACTION: rash  . Prednisone     REACTION: agressive behavior     Review of Systems  Constitutional: Negative for appetite change and unexpected weight change.  Hematological: Negative for adenopathy.       Objective:   Physical Exam  Constitutional: She appears well-developed and well-nourished.  HENT:  Patient has cystic lesion left upper lip which is nontender about one half centimeter diameter. This is very mobile and consistent with mucocele  Neck: Neck supple.  Cardiovascular: Normal  rate and regular rhythm.   Pulmonary/Chest: Effort normal and breath sounds normal. No respiratory distress. She has no wheezes. She has no rales.  Musculoskeletal: She exhibits no edema.  Lymphadenopathy:    She has no cervical adenopathy.          Assessment & Plan:  Benign mucocele left upper lip. Reassurance given

## 2012-11-01 NOTE — Patient Instructions (Addendum)
You have a benign mucocele upper lip.  These generally go away on their own but sometimes can last up to a couple of years. Only treatment would be excision but this is not necessary unless this continues to grow

## 2012-11-28 ENCOUNTER — Ambulatory Visit
Admission: RE | Admit: 2012-11-28 | Discharge: 2012-11-28 | Disposition: A | Discharge status: Home / Self Care | Payer: Self-pay | Source: Ambulatory Visit | Attending: Family Medicine | Admitting: Family Medicine

## 2012-11-28 ENCOUNTER — Encounter: Payer: Self-pay | Admitting: Family Medicine

## 2012-11-28 ENCOUNTER — Ambulatory Visit (INDEPENDENT_AMBULATORY_CARE_PROVIDER_SITE_OTHER): Payer: BC Managed Care – PPO | Admitting: Family Medicine

## 2012-11-28 VITALS — BP 110/72 | Temp 98.4°F | Wt 238.0 lb

## 2012-11-28 DIAGNOSIS — R1011 Right upper quadrant pain: Secondary | ICD-10-CM

## 2012-11-28 LAB — CBC WITH DIFFERENTIAL/PLATELET
Basophils Relative: 0.3 % (ref 0.0–3.0)
Eosinophils Absolute: 0.1 10*3/uL (ref 0.0–0.7)
Lymphocytes Relative: 31.6 % (ref 12.0–46.0)
MCHC: 33.6 g/dL (ref 30.0–36.0)
Neutrophils Relative %: 56.1 % (ref 43.0–77.0)
RBC: 4.69 Mil/uL (ref 3.87–5.11)
WBC: 4.8 10*3/uL (ref 4.5–10.5)

## 2012-11-28 LAB — HEPATIC FUNCTION PANEL
Alkaline Phosphatase: 55 U/L (ref 39–117)
Bilirubin, Direct: 0 mg/dL (ref 0.0–0.3)
Total Protein: 6.6 g/dL (ref 6.0–8.3)

## 2012-11-28 NOTE — Patient Instructions (Addendum)
Follow up promptly for any fever, recurrent vomiting, or worsening pain. Try to avoid high fat foods for now.

## 2012-11-28 NOTE — Progress Notes (Signed)
  Subjective:    Patient ID: Emily [name], female    DOB: 04-30-1951, 62 y.o.   MRN: [id number]  HPI Patient seen with abdominal pain right upper quadrant mostly for the past couple weeks Symptoms are intermittent. Worse after eating but no specific foods Severity is 7/10 at its worse She's had some nausea but no vomiting. No fever. No chills. Denies any stool changes. Quality of pain described as dull. Occasional radiation toward left shoulder blade She had abdominal ultrasound 2006 which was normal No alleviating factors  Past Medical History  Diagnosis Date  . Depression   . Eating disorder   . Glaucoma   . Anxiety   . Hiatal hernia   . History of cardiovascular stress test 04/2000    cardiolyte  . History of flexible sigmoidoscopy 09/21/1999  . Cancer 2009    squamous cell skin   Past Surgical History  Procedure Laterality Date  . Cardiac catheterization  04/2000  . Upper gastrointestinal endoscopy  09/15/1999  . Abdominal hysterectomy    . Appendectomy    . Colonoscopy  12/31/09    reports that she quit smoking about 4 years ago. Her smoking use included Cigarettes. She has a 10 pack-year smoking history. She has never used smokeless tobacco. She reports that  drinks alcohol. She reports that she does not use illicit drugs. family history includes Arthritis in an unspecified family member; Cancer in an unspecified family member; Coronary artery disease in an unspecified family member; and Stroke in an unspecified family member. Allergies  Allergen Reactions  . Penicillins     REACTION: rash  . Prednisone     REACTION: agressive behavior      Review of Systems  Constitutional: Negative for fever, chills, appetite change and unexpected weight change.  Respiratory: Negative for cough and shortness of breath.   Cardiovascular: Negative for chest pain and palpitations.  Gastrointestinal: Positive for nausea and abdominal pain. Negative for vomiting, diarrhea and  constipation.  Genitourinary: Negative for dysuria.       Objective:   Physical Exam  Constitutional: She appears well-developed and well-nourished.  Cardiovascular: Normal rate and regular rhythm.   Pulmonary/Chest: Effort normal and breath sounds normal. No respiratory distress. She has no wheezes. She has no rales.  Abdominal: Soft. Bowel sounds are normal. She exhibits no distension. There is tenderness.  Tender right upper quadrant to deep palpation. Minimal tenderness midepigastric region. No guarding or rebound. No masses          Assessment & Plan:  Abdominal pain. Mostly right upper quadrant. Rule out symptomatic gallstones. Obtain abdominal ultrasound. CBC, hepatic panel, and lipase. Avoid high fat foods. Followup immediately for fever or worsening symptoms

## 2012-11-29 ENCOUNTER — Telehealth: Payer: Self-pay | Admitting: Family Medicine

## 2012-11-29 DIAGNOSIS — R1011 Right upper quadrant pain: Secondary | ICD-10-CM

## 2012-11-29 NOTE — Telephone Encounter (Signed)
Referral made, pt informed on personally identified VM

## 2012-11-29 NOTE — Telephone Encounter (Signed)
Pt would like to proceed w/ Gastric referral.  Had previously told you she would wait, but has changed her mind. Pt will be out of town 6/23-6/24

## 2012-11-29 NOTE — Progress Notes (Signed)
Quick Note:  Pt informed, she wants to give it some time to see if things improve., if not, she will call back for referral. ______

## 2012-11-29 NOTE — Telephone Encounter (Signed)
Pt called and stated that she would like the results of her abdominal ultrasound from 11/28/12. Please assist.

## 2012-11-29 NOTE — Progress Notes (Signed)
Quick Note:  Pt informed ______ 

## 2013-01-01 ENCOUNTER — Other Ambulatory Visit: Payer: Self-pay | Admitting: Family Medicine

## 2013-02-07 ENCOUNTER — Encounter: Payer: Self-pay | Admitting: Gastroenterology

## 2013-02-24 ENCOUNTER — Ambulatory Visit (INDEPENDENT_AMBULATORY_CARE_PROVIDER_SITE_OTHER): Payer: BC Managed Care – PPO | Admitting: Family Medicine

## 2013-02-24 ENCOUNTER — Encounter: Payer: Self-pay | Admitting: Family Medicine

## 2013-02-24 VITALS — BP 124/76 | HR 86 | Temp 98.1°F | Wt 243.0 lb

## 2013-02-24 DIAGNOSIS — J019 Acute sinusitis, unspecified: Secondary | ICD-10-CM

## 2013-02-24 MED ORDER — AZITHROMYCIN 250 MG PO TABS: ORAL_TABLET | ORAL | Status: AC

## 2013-02-24 NOTE — Progress Notes (Signed)
  Subjective:    Patient ID: Emily [name], female    DOB: [birth year], 62 y.o.   MRN: [id number]  HPI Acute visit for 8-9 day history of sinus congestion She's had some intermittent bloody nasal discharge of the right naris along with some yellow-green mucus. She's had some cough occasional productive of increased malaise and intermittent headaches. She's had increased pressure and pain maxillary sinuses and frontal sinuses. Denies any fever or chills.  She has taken Mucinex and over-the-counter cold and cough medications without much improvement  Past Medical History  Diagnosis Date  . Depression   . Eating disorder   . Glaucoma   . Anxiety   . Hiatal hernia   . History of cardiovascular stress test 04/2000    cardiolyte  . History of flexible sigmoidoscopy 09/21/1999  . Cancer 2009    squamous cell skin   Past Surgical History  Procedure Laterality Date  . Cardiac catheterization  04/2000  . Upper gastrointestinal endoscopy  09/15/1999  . Abdominal hysterectomy    . Appendectomy    . Colonoscopy  12/31/09    reports that she quit smoking about 4 years ago. Her smoking use included Cigarettes. She has a 10 pack-year smoking history. She has never used smokeless tobacco. She reports that  drinks alcohol. She reports that she does not use illicit drugs. family history includes Arthritis in an other family member; Cancer in an other family member; Coronary artery disease in an other family member; Stroke in an other family member. Allergies  Allergen Reactions  . Penicillins     REACTION: rash  . Prednisone     REACTION: agressive behavior      Review of Systems  Constitutional: Positive for fatigue. Negative for fever and chills.  HENT: Positive for nosebleeds, congestion and sinus pressure. Negative for ear pain.   Respiratory: Positive for cough. Negative for shortness of breath and wheezing.   Neurological: Positive for headaches.       Objective:   Physical Exam   Constitutional: She appears well-developed and well-nourished.  HENT:  Right Ear: External ear normal.  Left Ear: External ear normal.  Nose: Nose normal.  Mouth/Throat: Oropharynx is clear and moist.  Neck: Neck supple.  Cardiovascular: Normal rate and regular rhythm.   Pulmonary/Chest: Effort normal and breath sounds normal. No respiratory distress. She has no wheezes. She has no rales.  Lymphadenopathy:    She has no cervical adenopathy.          Assessment & Plan:  Acute sinusitis. Given duration of symptoms and failure of conservative management start Zithromax. Continue Mucinex. Plenty of fluids. Followup as needed

## 2013-02-24 NOTE — Patient Instructions (Addendum)

## 2013-07-29 ENCOUNTER — Ambulatory Visit (INDEPENDENT_AMBULATORY_CARE_PROVIDER_SITE_OTHER): Payer: BC Managed Care – PPO | Admitting: Family Medicine

## 2013-07-29 ENCOUNTER — Encounter: Payer: Self-pay | Admitting: Family Medicine

## 2013-07-29 VITALS — BP 114/80 | HR 96 | Temp 97.9°F | Ht 67.0 in | Wt 239.0 lb

## 2013-07-29 DIAGNOSIS — J329 Chronic sinusitis, unspecified: Secondary | ICD-10-CM

## 2013-07-29 MED ORDER — AZITHROMYCIN 250 MG PO TABS: ORAL_TABLET | ORAL | Status: DC

## 2013-07-29 NOTE — Patient Instructions (Signed)

## 2013-07-29 NOTE — Progress Notes (Signed)
Pre visit review using our clinic review tool, if applicable. No additional management support is needed unless otherwise documented below in the visit note. 

## 2013-07-29 NOTE — Progress Notes (Signed)
Chief Complaint  Patient presents with  . clogged ears  . Adenopathy    x10 days    HPI:  -started: 10 days getting worse -symptoms:nasal congestion, sinus pain, sore throat, cough, ears clogged, max tooth pain -denies:fever, SOB, NVD -sick contacts/travel/risks: denies flu exposure or Ebola risks -Hx of: allergies ROS: See pertinent positives and negatives per HPI.  Past Medical History  Diagnosis Date  . Depression   . Eating disorder   . Glaucoma   . Anxiety   . Hiatal hernia   . History of cardiovascular stress test 04/2000    cardiolyte  . History of flexible sigmoidoscopy 09/21/1999  . Cancer 2009    squamous cell skin    Past Surgical History  Procedure Laterality Date  . Cardiac catheterization  04/2000  . Upper gastrointestinal endoscopy  09/15/1999  . Abdominal hysterectomy    . Appendectomy    . Colonoscopy  12/31/09    Family History  Problem Relation Age of Onset  . Arthritis    . Cancer      lung  . Coronary artery disease    . Stroke      History   Social History  . Marital Status: Married    Spouse Name: N/A    Number of Children: N/A  . Years of Education: N/A   Social History Main Topics  . Smoking status: Former Smoker -- 0.50 packs/day for 20 years    Types: Cigarettes    Quit date: 05/27/2008  . Smokeless tobacco: Never Used  . Alcohol Use: Yes     Comment: twice a month  . Drug Use: No  . Sexual Activity: None   Other Topics Concern  . None   Social History Narrative  . None    Current outpatient prescriptions:ALPRAZolam (XANAX) 1 MG tablet, Take 1 mg by mouth 3 (three) times daily as needed. For anxiety., Disp: , Rfl: ;  brinzolamide (AZOPT) 1 % ophthalmic suspension, Place 1 drop into both eyes 2 (two) times daily. , Disp: , Rfl: ;  colchicine 0.6 MG tablet, Take 0.6 mg by mouth daily as needed. For gout flareups. , Disp: , Rfl:  furosemide (LASIX) 20 MG tablet, TAKE 1 TABLET BY MOUTH DAILY AS NEEDED, Disp: 30 tablet, Rfl:  1;  levobunolol (BETAGAN) 0.25 % ophthalmic solution, Place 1 drop into both eyes at bedtime., Disp: , Rfl: ;  Travoprost (TRAVATAN OP), Apply to eye daily. Dr Venetia Maxon, Disp: , Rfl: ;  traZODone (DESYREL) 100 MG tablet, Take 300 mg by mouth at bedtime. , Disp: , Rfl:  azithromycin (ZITHROMAX) 250 MG tablet, 2 tabs on first day then 1 tab daily for 4, Disp: 6 tablet, Rfl: 0  EXAM:  Filed Vitals:   07/29/13 1355  BP: 114/80  Pulse: 96  Temp: 97.9 F (36.6 C)    Body mass index is 37.42 kg/(m^2).  GENERAL: vitals reviewed and listed above, alert, oriented, appears well hydrated and in no acute distress  HEENT: atraumatic, conjunttiva clear, no obvious abnormalities on inspection of external nose and ears, normal appearance of ear canals and TMs, clear nasal congestion, mild post oropharyngeal erythema with PND, no tonsillar edema or exudate, max sinus TTP  NECK: no obvious masses on inspection  LUNGS: clear to auscultation bilaterally, no wheezes, rales or rhonchi, good air movement  CV: HRRR, no peripheral edema  MS: moves all extremities without noticeable abnormality  PSYCH: pleasant and cooperative, no obvious depression or anxiety  ASSESSMENT AND PLAN:  Discussed  the following assessment and plan:  Sinusitis - Plan: azithromycin (ZITHROMAX) 250 MG tablet  - We discussed potential etiologies, with sinusitis likely.  -We discussed treatment side effects, likely course, antibiotic misuse, transmission, and signs of developing a serious illness. -of course, we advised to return or notify a doctor immediately if symptoms worsen or persist or new concerns arise.    Patient Instructions  -As we discussed, we have prescribed a new medication for you at this appointment. We discussed the common and serious potential adverse effects of this medication and you can review these and more with the pharmacist when you pick up your medication.  Please follow the instructions for use  carefully and notify us immediately if you have any problems taking this medication.       Colin Benton R.

## 2013-08-01 ENCOUNTER — Ambulatory Visit: Payer: Self-pay | Admitting: Family Medicine

## 2013-10-01 ENCOUNTER — Ambulatory Visit: Payer: Self-pay | Admitting: Family Medicine

## 2013-10-03 ENCOUNTER — Encounter: Payer: Self-pay | Admitting: Family Medicine

## 2013-10-03 ENCOUNTER — Ambulatory Visit (INDEPENDENT_AMBULATORY_CARE_PROVIDER_SITE_OTHER): Payer: BC Managed Care – PPO | Admitting: Family Medicine

## 2013-10-03 VITALS — BP 124/80 | HR 77 | Temp 98.0°F | Wt 239.0 lb

## 2013-10-03 DIAGNOSIS — R609 Edema, unspecified: Secondary | ICD-10-CM

## 2013-10-03 DIAGNOSIS — R0600 Dyspnea, unspecified: Secondary | ICD-10-CM

## 2013-10-03 DIAGNOSIS — R600 Localized edema: Secondary | ICD-10-CM

## 2013-10-03 DIAGNOSIS — R0989 Other specified symptoms and signs involving the circulatory and respiratory systems: Secondary | ICD-10-CM

## 2013-10-03 DIAGNOSIS — R5381 Other malaise: Secondary | ICD-10-CM

## 2013-10-03 DIAGNOSIS — R5383 Other fatigue: Secondary | ICD-10-CM

## 2013-10-03 DIAGNOSIS — R0609 Other forms of dyspnea: Secondary | ICD-10-CM

## 2013-10-03 LAB — TSH: TSH: 2.41 u[IU]/mL (ref 0.35–5.50)

## 2013-10-03 LAB — CBC WITH DIFFERENTIAL/PLATELET
BASOS PCT: 0.7 % (ref 0.0–3.0)
Basophils Absolute: 0 10*3/uL (ref 0.0–0.1)
EOS PCT: 3.2 % (ref 0.0–5.0)
Eosinophils Absolute: 0.2 10*3/uL (ref 0.0–0.7)
HCT: 42.6 % (ref 36.0–46.0)
HEMOGLOBIN: 14.7 g/dL (ref 12.0–15.0)
LYMPHS PCT: 27.8 % (ref 12.0–46.0)
Lymphs Abs: 1.4 10*3/uL (ref 0.7–4.0)
MCHC: 34.5 g/dL (ref 30.0–36.0)
MCV: 91.5 fl (ref 78.0–100.0)
MONOS PCT: 10.9 % (ref 3.0–12.0)
Monocytes Absolute: 0.5 10*3/uL (ref 0.1–1.0)
NEUTROS ABS: 2.9 10*3/uL (ref 1.4–7.7)
NEUTROS PCT: 57.4 % (ref 43.0–77.0)
Platelets: 236 10*3/uL (ref 150.0–400.0)
RBC: 4.66 Mil/uL (ref 3.87–5.11)
RDW: 13.3 % (ref 11.5–14.6)
WBC: 5 10*3/uL (ref 4.5–10.5)

## 2013-10-03 LAB — BASIC METABOLIC PANEL
BUN: 15 mg/dL (ref 6–23)
CALCIUM: 9.6 mg/dL (ref 8.4–10.5)
CO2: 28 meq/L (ref 19–32)
CREATININE: 0.9 mg/dL (ref 0.4–1.2)
Chloride: 106 mEq/L (ref 96–112)
GFR: 66.27 mL/min (ref >60.00)
Glucose, Bld: 108 mg/dL — ABNORMAL HIGH (ref 70–99)
Potassium: 4.3 mEq/L (ref 3.5–5.1)
SODIUM: 141 meq/L (ref 135–145)

## 2013-10-03 LAB — HEPATIC FUNCTION PANEL
ALK PHOS: 55 U/L (ref 39–117)
ALT: 36 U/L — AB (ref 0–35)
AST: 25 U/L (ref 0–37)
Albumin: 3.7 g/dL (ref 3.5–5.2)
BILIRUBIN DIRECT: 0 mg/dL (ref 0.0–0.3)
BILIRUBIN TOTAL: 0.6 mg/dL (ref 0.3–1.2)
TOTAL PROTEIN: 6.8 g/dL (ref 6.0–8.3)

## 2013-10-03 LAB — BRAIN NATRIURETIC PEPTIDE: Pro B Natriuretic peptide (BNP): 13 pg/mL (ref 0.0–100.0)

## 2013-10-03 NOTE — Progress Notes (Signed)
Pre visit review using our clinic review tool, if applicable. No additional management support is needed unless otherwise documented below in the visit note. 

## 2013-10-03 NOTE — Progress Notes (Signed)
   Subjective:    Patient ID: Emily [name], female    DOB: 17-Apr-1951, 63 y.o.   MRN: [id number]  HPI Patient seen with multiple complaints. She states had some increased edema of her feet ankles past 10 days. Has some tendencies toward edema at baseline. Her weight is actually down 4 pounds from visit last fall. She states she generally does not drink a lot of fluids in the morning. She's not any increased urinary frequency. She's complaining of about 3-4 days of some increased shortness of breath. This does not occur with exertion. No chest pain. No cough. No fevers or chills. No pleuritic pain.  She's felt achy in her hands and left knee. No visible erythema or any warmth or increased edema.  She complains of some intermittent pain under her right breast. Similar pains last June with ultrasound then unremarkable. Possibly worse after eating. No radiation of pain. No nausea or vomiting. No melena. No hematemesis.  Past Medical History  Diagnosis Date  . Depression   . Eating disorder   . Glaucoma   . Anxiety   . Hiatal hernia   . History of cardiovascular stress test 04/2000    cardiolyte  . History of flexible sigmoidoscopy 09/21/1999  . Cancer 2009    squamous cell skin   Past Surgical History  Procedure Laterality Date  . Cardiac catheterization  04/2000  . Upper gastrointestinal endoscopy  09/15/1999  . Abdominal hysterectomy    . Appendectomy    . Colonoscopy  12/31/09    reports that she quit smoking about 5 years ago. Her smoking use included Cigarettes. She has a 10 pack-year smoking history. She has never used smokeless tobacco. She reports that she drinks alcohol. She reports that she does not use illicit drugs. family history includes Arthritis in an other family member; Cancer in an other family member; Coronary artery disease in an other family member; Stroke in an other family member. Allergies  Allergen Reactions  . Penicillins     REACTION: rash  . Prednisone     REACTION: agressive behavior      Review of Systems  Constitutional: Positive for fatigue. Negative for fever, chills, appetite change and unexpected weight change.  Respiratory: Positive for shortness of breath. Negative for cough.   Cardiovascular: Positive for leg swelling. Negative for chest pain and palpitations.  Gastrointestinal: Positive for abdominal pain.  Genitourinary: Negative for dysuria.  Musculoskeletal: Positive for arthralgias.  Neurological: Negative for dizziness.       Objective:   Physical Exam  Constitutional: She appears well-developed and well-nourished.  HENT:  Right Ear: External ear normal.  Left Ear: External ear normal.  Mouth/Throat: Oropharynx is clear and moist.  Neck: Neck supple. No thyromegaly present.  Cardiovascular: Normal rate.   Pulmonary/Chest: Effort normal and breath sounds normal. No respiratory distress. She has no wheezes. She has no rales.  Musculoskeletal:  Trace nonpitting edema legs feet ankles bilaterally  Lymphadenopathy:    She has no cervical adenopathy.          Assessment & Plan:  Patient presents with multiple nonspecific issues including mild bilateral leg edema, subjective dyspnea, intermittent arthralgias mostly involving hands with no objective signs of inflammation. She has intermittent right upper quadrant pain. Normal ultrasound last June. Recommend screening labs - CBC, basic metabolic panel, BNP level. Chest x-ray.  No evidence for overt CHF and no cardiac hx.

## 2013-10-06 ENCOUNTER — Ambulatory Visit (INDEPENDENT_AMBULATORY_CARE_PROVIDER_SITE_OTHER)
Admission: RE | Admit: 2013-10-06 | Discharge: 2013-10-06 | Disposition: A | Discharge status: Home / Self Care | Payer: BC Managed Care – PPO | Source: Ambulatory Visit | Attending: Family Medicine | Admitting: Family Medicine

## 2013-10-06 DIAGNOSIS — R609 Edema, unspecified: Secondary | ICD-10-CM

## 2013-10-06 DIAGNOSIS — R0609 Other forms of dyspnea: Secondary | ICD-10-CM

## 2013-10-06 DIAGNOSIS — R600 Localized edema: Secondary | ICD-10-CM

## 2013-10-06 DIAGNOSIS — R0989 Other specified symptoms and signs involving the circulatory and respiratory systems: Secondary | ICD-10-CM

## 2013-10-06 DIAGNOSIS — R0600 Dyspnea, unspecified: Secondary | ICD-10-CM

## 2013-11-18 ENCOUNTER — Other Ambulatory Visit: Payer: Self-pay

## 2013-11-18 DIAGNOSIS — Z1231 Encounter for screening mammogram for malignant neoplasm of breast: Secondary | ICD-10-CM

## 2013-11-24 ENCOUNTER — Ambulatory Visit: Payer: Self-pay

## 2013-11-26 ENCOUNTER — Ambulatory Visit
Admission: RE | Admit: 2013-11-26 | Discharge: 2013-11-26 | Disposition: A | Discharge status: Home / Self Care | Payer: BC Managed Care – PPO | Source: Ambulatory Visit

## 2013-11-26 ENCOUNTER — Encounter (INDEPENDENT_AMBULATORY_CARE_PROVIDER_SITE_OTHER): Payer: Self-pay

## 2013-11-26 DIAGNOSIS — Z1231 Encounter for screening mammogram for malignant neoplasm of breast: Secondary | ICD-10-CM

## 2014-03-17 ENCOUNTER — Ambulatory Visit (INDEPENDENT_AMBULATORY_CARE_PROVIDER_SITE_OTHER): Payer: BC Managed Care – PPO | Admitting: Family Medicine

## 2014-03-17 ENCOUNTER — Encounter: Payer: Self-pay | Admitting: Family Medicine

## 2014-03-17 VITALS — BP 128/76 | HR 76 | Temp 98.0°F | Wt 237.0 lb

## 2014-03-17 DIAGNOSIS — K130 Diseases of lips: Secondary | ICD-10-CM

## 2014-03-17 DIAGNOSIS — Z23 Encounter for immunization: Secondary | ICD-10-CM

## 2014-03-17 NOTE — Progress Notes (Signed)
   Subjective:    Patient ID: Emily [name], female    DOB: 31-Aug-1950, 63 y.o.   MRN: [id number]  Oral Pain  Pertinent negatives include no fever.   Hx of mucocele left upper lip.  Slowly increasing in size.  Asymptomatic but recently (last Thursday) brushing teeth and accidentally brushed hard against her left upper gum.  No bleeding.  Sore since then.  No adenopathy. No appetite or weight changes.  Past Medical History  Diagnosis Date  . Depression   . Eating disorder   . Glaucoma   . Anxiety   . Hiatal hernia   . History of cardiovascular stress test 04/2000    cardiolyte  . History of flexible sigmoidoscopy 09/21/1999  . Cancer 2009    squamous cell skin   Past Surgical History  Procedure Laterality Date  . Cardiac catheterization  04/2000  . Upper gastrointestinal endoscopy  09/15/1999  . Abdominal hysterectomy    . Appendectomy    . Colonoscopy  12/31/09    reports that she quit smoking about 5 years ago. Her smoking use included Cigarettes. She has a 10 pack-year smoking history. She has never used smokeless tobacco. She reports that she drinks alcohol. She reports that she does not use illicit drugs. family history includes Arthritis in an other family member; Cancer in an other family member; Coronary artery disease in an other family member; Stroke in an other family member. Allergies  Allergen Reactions  . Penicillins     REACTION: rash  . Prednisone     REACTION: agressive behavior      Review of Systems  Constitutional: Negative for fever, appetite change and unexpected weight change.  Hematological: Negative for adenopathy.       Objective:   Physical Exam  Constitutional: She appears well-developed and well-nourished.  HENT:  Mobile nontender approximately 1 cm cystic mass left upper inner lip.  Superior to this has some nonspecific gum erythema.  Mucosa otherwise normal.  Neck: Neck supple.  Cardiovascular: Normal rate.   Lymphadenopathy:    She  has no cervical adenopathy.          Assessment & Plan:  Benign mucocele left upper lip.  We explained only real treatment is excision if this is growing or starting to bother her in any way.  We also explained that they usually go away with time without intervention and have recommended observation at this time.

## 2014-03-17 NOTE — Progress Notes (Signed)
Pre visit review using our clinic review tool, if applicable. No additional management support is needed unless otherwise documented below in the visit note. 

## 2014-07-29 ENCOUNTER — Encounter (INDEPENDENT_AMBULATORY_CARE_PROVIDER_SITE_OTHER): Payer: Self-pay

## 2014-07-29 ENCOUNTER — Encounter: Payer: Self-pay | Admitting: Family Medicine

## 2014-07-29 ENCOUNTER — Ambulatory Visit (INDEPENDENT_AMBULATORY_CARE_PROVIDER_SITE_OTHER): Payer: BLUE CROSS/BLUE SHIELD | Admitting: Family Medicine

## 2014-07-29 VITALS — BP 126/80 | HR 76 | Temp 97.6°F | Wt 240.0 lb

## 2014-07-29 DIAGNOSIS — M79644 Pain in right finger(s): Secondary | ICD-10-CM

## 2014-07-29 NOTE — Patient Instructions (Signed)
Ice right thumb 2-3 times daily Try over the counter anti-inflammatory such as Advil or Aleve short term Consider thumb spica splint for severe pain. Let me know in 2-3 weeks if no better.

## 2014-07-29 NOTE — Progress Notes (Signed)
Pre visit review using our clinic review tool, if applicable. No additional management support is needed unless otherwise documented below in the visit note. 

## 2014-07-29 NOTE — Progress Notes (Signed)
   Subjective:    Patient ID: Emily [name], female    DOB: 06-Jun-1951, 64 y.o.   MRN: [id number]  HPI Right thumb pain. She is right hand dominant. Yesterday she was pulling on some clothes and felt a popping sensation in her right thumb. She could not localize where. She now has pain along the [facility] and MCP joints of thumb. Possibly some mild swelling. She has pain with extending or abducting the thumb. Pain radiates to the wrist region. No prior history of thumb injury. She has not tried any icing or anti-inflammatories.  Past Medical History  Diagnosis Date  . Depression   . Eating disorder   . Glaucoma   . Anxiety   . Hiatal hernia   . History of cardiovascular stress test 04/2000    cardiolyte  . History of flexible sigmoidoscopy 09/21/1999  . Cancer 2009    squamous cell skin   Past Surgical History  Procedure Laterality Date  . Cardiac catheterization  04/2000  . Upper gastrointestinal endoscopy  09/15/1999  . Abdominal hysterectomy    . Appendectomy    . Colonoscopy  12/31/09    reports that she quit smoking about 6 years ago. Her smoking use included Cigarettes. She has a 10 pack-year smoking history. She has never used smokeless tobacco. She reports that she drinks alcohol. She reports that she does not use illicit drugs. family history includes Arthritis in an other family member; Cancer in an other family member; Coronary artery disease in an other family member; Stroke in an other family member. Allergies  Allergen Reactions  . Penicillins     REACTION: rash  . Prednisone     REACTION: agressive behavior      Review of Systems  Neurological: Negative for weakness and numbness.       Objective:   Physical Exam  Constitutional: She appears well-developed and well-nourished.  Cardiovascular: Normal rate and regular rhythm.   Pulmonary/Chest: Effort normal and breath sounds normal. No respiratory distress. She has no wheezes. She has no rales.  Musculoskeletal:    Right thumb reveals possibly some mild diffuse swelling compared with the left. There is no ecchymosis. No erythema. No warmth. She has some nonspecific tenderness at the MCP and CMC joints. She also has some tenderness along the abductor /extensor tendons of the thumb and the wrist. No crepitus. Full range of motion right thumb          Assessment & Plan:  Right thumb pain. Suspect tendon strain. Functionally intact. We recommended icing, Advil, consider thumb spica splint for the next couple of weeks. Orthopedic referral if no better after that

## 2014-10-04 ENCOUNTER — Emergency Department (HOSPITAL_BASED_OUTPATIENT_CLINIC_OR_DEPARTMENT_OTHER): Payer: 59

## 2014-10-04 ENCOUNTER — Emergency Department (HOSPITAL_BASED_OUTPATIENT_CLINIC_OR_DEPARTMENT_OTHER)
Admission: EM | Admit: 2014-10-04 | Discharge: 2014-10-04 | Disposition: A | Discharge status: Home / Self Care | Payer: 59 | Attending: Emergency Medicine | Admitting: Emergency Medicine

## 2014-10-04 ENCOUNTER — Encounter (HOSPITAL_BASED_OUTPATIENT_CLINIC_OR_DEPARTMENT_OTHER): Payer: Self-pay

## 2014-10-04 DIAGNOSIS — Z9889 Other specified postprocedural states: Secondary | ICD-10-CM | POA: Diagnosis not present

## 2014-10-04 DIAGNOSIS — Z880 Allergy status to penicillin: Secondary | ICD-10-CM | POA: Insufficient documentation

## 2014-10-04 DIAGNOSIS — Z79899 Other long term (current) drug therapy: Secondary | ICD-10-CM | POA: Insufficient documentation

## 2014-10-04 DIAGNOSIS — R1011 Right upper quadrant pain: Secondary | ICD-10-CM | POA: Diagnosis present

## 2014-10-04 DIAGNOSIS — Z87891 Personal history of nicotine dependence: Secondary | ICD-10-CM | POA: Diagnosis not present

## 2014-10-04 DIAGNOSIS — F329 Major depressive disorder, single episode, unspecified: Secondary | ICD-10-CM | POA: Diagnosis not present

## 2014-10-04 DIAGNOSIS — R110 Nausea: Secondary | ICD-10-CM | POA: Insufficient documentation

## 2014-10-04 DIAGNOSIS — Z85828 Personal history of other malignant neoplasm of skin: Secondary | ICD-10-CM | POA: Diagnosis not present

## 2014-10-04 DIAGNOSIS — Z90710 Acquired absence of both cervix and uterus: Secondary | ICD-10-CM | POA: Diagnosis not present

## 2014-10-04 DIAGNOSIS — H409 Unspecified glaucoma: Secondary | ICD-10-CM | POA: Diagnosis not present

## 2014-10-04 DIAGNOSIS — E669 Obesity, unspecified: Secondary | ICD-10-CM | POA: Diagnosis not present

## 2014-10-04 DIAGNOSIS — M549 Dorsalgia, unspecified: Secondary | ICD-10-CM | POA: Diagnosis not present

## 2014-10-04 DIAGNOSIS — F419 Anxiety disorder, unspecified: Secondary | ICD-10-CM | POA: Insufficient documentation

## 2014-10-04 DIAGNOSIS — Z9049 Acquired absence of other specified parts of digestive tract: Secondary | ICD-10-CM | POA: Diagnosis not present

## 2014-10-04 LAB — CBC WITH DIFFERENTIAL/PLATELET
Basophils Absolute: 0 10*3/uL (ref 0.0–0.1)
Basophils Relative: 1 % (ref 0–1)
EOS PCT: 4 % (ref 0–5)
Eosinophils Absolute: 0.2 10*3/uL (ref 0.0–0.7)
HEMATOCRIT: 41.2 % (ref 36.0–46.0)
HEMOGLOBIN: 13.9 g/dL (ref 12.0–15.0)
LYMPHS ABS: 2 10*3/uL (ref 0.7–4.0)
LYMPHS PCT: 38 % (ref 12–46)
MCH: 30.9 pg (ref 26.0–34.0)
MCHC: 33.7 g/dL (ref 30.0–36.0)
MCV: 91.6 fL (ref 78.0–100.0)
MONOS PCT: 13 % — AB (ref 3–12)
Monocytes Absolute: 0.7 10*3/uL (ref 0.1–1.0)
NEUTROS PCT: 44 % (ref 43–77)
Neutro Abs: 2.3 10*3/uL (ref 1.7–7.7)
PLATELETS: 218 10*3/uL (ref 150–400)
RBC: 4.5 MIL/uL (ref 3.87–5.11)
RDW: 12.6 % (ref 11.5–15.5)
WBC: 5.3 10*3/uL (ref 4.0–10.5)

## 2014-10-04 LAB — COMPREHENSIVE METABOLIC PANEL
ALBUMIN: 3.7 g/dL (ref 3.5–5.2)
ALK PHOS: 48 U/L (ref 39–117)
ALT: 33 U/L (ref 0–35)
AST: 25 U/L (ref 0–37)
Anion gap: 8 (ref 5–15)
BUN: 15 mg/dL (ref 6–23)
CHLORIDE: 105 mmol/L (ref 96–112)
CO2: 25 mmol/L (ref 19–32)
Calcium: 8.8 mg/dL (ref 8.4–10.5)
Creatinine, Ser: 0.85 mg/dL (ref 0.50–1.10)
GFR calc Af Amer: 82 mL/min — ABNORMAL LOW (ref >90)
GFR calc non Af Amer: 71 mL/min — ABNORMAL LOW (ref >90)
GLUCOSE: 87 mg/dL (ref 70–99)
POTASSIUM: 3.9 mmol/L (ref 3.5–5.1)
SODIUM: 138 mmol/L (ref 135–145)
Total Bilirubin: 0.8 mg/dL (ref 0.3–1.2)
Total Protein: 6.3 g/dL (ref 6.0–8.3)

## 2014-10-04 LAB — URINALYSIS, ROUTINE W REFLEX MICROSCOPIC
Bilirubin Urine: NEGATIVE
GLUCOSE, UA: NEGATIVE mg/dL
Hgb urine dipstick: NEGATIVE
KETONES UR: NEGATIVE mg/dL
LEUKOCYTES UA: NEGATIVE
NITRITE: NEGATIVE
PH: 6 (ref 5.0–8.0)
Protein, ur: NEGATIVE mg/dL
Specific Gravity, Urine: 1.026 (ref 1.005–1.030)
Urobilinogen, UA: 0.2 mg/dL (ref 0.0–1.0)

## 2014-10-04 LAB — LIPASE, BLOOD: LIPASE: 25 U/L (ref 11–59)

## 2014-10-04 MED ORDER — SODIUM CHLORIDE 0.9 % IV BOLUS (SEPSIS)
500.0000 mL | Freq: Once | INTRAVENOUS | Status: AC
  Administered 2014-10-04: 500 mL via INTRAVENOUS

## 2014-10-04 MED ORDER — PANTOPRAZOLE SODIUM 40 MG IV SOLR
40.0000 mg | Freq: Once | INTRAVENOUS | Status: DC
  Filled 2014-10-04: qty 40

## 2014-10-04 MED ORDER — HYDROCODONE-ACETAMINOPHEN 5-325 MG PO TABS: 1.0000 | ORAL_TABLET | ORAL | Status: DC | PRN

## 2014-10-04 MED ORDER — PANTOPRAZOLE SODIUM 20 MG PO TBEC: 20.0000 mg | DELAYED_RELEASE_TABLET | Freq: Every day | ORAL | Status: DC

## 2014-10-04 MED ORDER — ONDANSETRON 4 MG PO TBDP: ORAL_TABLET | ORAL | Status: DC

## 2014-10-04 MED ORDER — MORPHINE SULFATE 4 MG/ML IJ SOLN
4.0000 mg | Freq: Once | INTRAMUSCULAR | Status: AC
  Administered 2014-10-04: 4 mg via INTRAVENOUS
  Filled 2014-10-04: qty 1

## 2014-10-04 MED ORDER — ONDANSETRON HCL 4 MG/2ML IJ SOLN
4.0000 mg | Freq: Once | INTRAMUSCULAR | Status: AC
  Administered 2014-10-04: 4 mg via INTRAVENOUS
  Filled 2014-10-04: qty 2

## 2014-10-04 NOTE — ED Notes (Signed)
Pt reports ruq abd pain (states under right anterior rib area but points to ruq) with nausea.  Reports has been ongoing for several years but acutely worse over the past 2 weeks.  Denies sob, diarrhea or fever.

## 2014-10-04 NOTE — Discharge Instructions (Signed)
Abdominal Pain, Women °Abdominal (stomach, pelvic, or belly) pain can be caused by many things. It is important to tell your doctor: °· The location of the pain. °· Does it come and go or is it present all the time? °· Are there things that start the pain (eating certain foods, exercise)? °· Are there other symptoms associated with the pain (fever, nausea, vomiting, diarrhea)? °All of this is helpful to know when trying to find the cause of the pain. °CAUSES  °· Stomach: virus or bacteria infection, or ulcer. °· Intestine: appendicitis (inflamed appendix), regional ileitis (Crohn's disease), ulcerative colitis (inflamed colon), irritable bowel syndrome, diverticulitis (inflamed diverticulum of the colon), or cancer of the stomach or intestine. °· Gallbladder disease or stones in the gallbladder. °· Kidney disease, kidney stones, or infection. °· Pancreas infection or cancer. °· Fibromyalgia (pain disorder). °· Diseases of the female organs: °¨ Uterus: fibroid (non-cancerous) tumors or infection. °¨ Fallopian tubes: infection or tubal pregnancy. °¨ Ovary: cysts or tumors. °¨ Pelvic adhesions (scar tissue). °¨ Endometriosis (uterus lining tissue growing in the pelvis and on the pelvic organs). °¨ Pelvic congestion syndrome (female organs filling up with blood just before the menstrual period). °¨ Pain with the menstrual period. °¨ Pain with ovulation (producing an egg). °¨ Pain with an IUD (intrauterine device, birth control) in the uterus. °¨ Cancer of the female organs. °· Functional pain (pain not caused by a disease, may improve without treatment). °· Psychological pain. °· Depression. °DIAGNOSIS  °Your doctor will decide the seriousness of your pain by doing an examination. °· Blood tests. °· X-rays. °· Ultrasound. °· CT scan (computed tomography, special type of X-ray). °· MRI (magnetic resonance imaging). °· Cultures, for infection. °· Barium enema (dye inserted in the large intestine, to better view it with  X-rays). °· Colonoscopy (looking in intestine with a lighted tube). °· Laparoscopy (minor surgery, looking in abdomen with a lighted tube). °· Major abdominal exploratory surgery (looking in abdomen with a large incision). °TREATMENT  °The treatment will depend on the cause of the pain.  °· Many cases can be observed and treated at home. °· Over-the-counter medicines recommended by your caregiver. °· Prescription medicine. °· Antibiotics, for infection. °· Birth control pills, for painful periods or for ovulation pain. °· Hormone treatment, for endometriosis. °· Nerve blocking injections. °· Physical therapy. °· Antidepressants. °· Counseling with a psychologist or psychiatrist. °· Minor or major surgery. °HOME CARE INSTRUCTIONS  °· Do not take laxatives, unless directed by your caregiver. °· Take over-the-counter pain medicine only if ordered by your caregiver. Do not take aspirin because it can cause an upset stomach or bleeding. °· Try a clear liquid diet (broth or water) as ordered by your caregiver. Slowly move to a bland diet, as tolerated, if the pain is related to the stomach or intestine. °· Have a thermometer and take your temperature several times a day, and record it. °· Bed rest and sleep, if it helps the pain. °· Avoid sexual intercourse, if it causes pain. °· Avoid stressful situations. °· Keep your follow-up appointments and tests, as your caregiver orders. °· If the pain does not go away with medicine or surgery, you may try: °¨ Acupuncture. °¨ Relaxation exercises (yoga, meditation). °¨ Group therapy. °¨ Counseling. °SEEK MEDICAL CARE IF:  °· You notice certain foods cause stomach pain. °· Your home care treatment is not helping your pain. °· You need stronger pain medicine. °· You want your IUD removed. °· You feel faint or   lightheaded. °· You develop nausea and vomiting. °· You develop a rash. °· You are having side effects or an allergy to your medicine. °SEEK IMMEDIATE MEDICAL CARE IF:  °· Your  pain does not go away or gets worse. °· You have a fever. °· Your pain is felt only in portions of the abdomen. The right side could possibly be appendicitis. The left lower portion of the abdomen could be colitis or diverticulitis. °· You are passing blood in your stools (bright red or black tarry stools, with or without vomiting). °· You have blood in your urine. °· You develop chills, with or without a fever. °· You pass out. °MAKE SURE YOU:  °· Understand these instructions. °· Will watch your condition. °· Will get help right away if you are not doing well or get worse. °Document Released: 04/02/2007 Document Revised: 10/20/2013 Document Reviewed: 04/22/2009 °[employer]® Patient Information ©2015 [employer]. This information is not intended to replace advice given to you by your health care provider. Make sure you discuss any questions you have with your health care provider. ° °

## 2014-10-04 NOTE — ED Notes (Signed)
Patient states pain at rt side increases after attempting to eat

## 2014-10-04 NOTE — ED Notes (Signed)
Pt came to ED today w/ c/o pain at rt side under rib cage, radiates to back, occassional nausea, w/ increased flatus and belching, onset approx 2 weeks ago.

## 2014-10-04 NOTE — ED Provider Notes (Signed)
CSN: [id number]     Arrival date & time 10/04/14  1427 History  This chart was scribed for Malvin Johns, MD by Edison Simon, ED Scribe. This patient was seen in room MH01/MH01 and the patient's care was started at 3:21 PM.    Chief Complaint  Patient presents with  . Abdominal Pain   The history is provided by the patient. No language interpreter was used.    HPI Comments: Emily [name] is a 64 y.o. female who presents to the Emergency Department complaining of RUQ abdominal pain. She states she has had the pain intermittently for the past few months, this episode since 2 weeks ago and becoming more constant. She states pain is now radiating to her back and is worse after eating. She reports associated nausea, belching, and gas. She states she has been using Tums without improvement. She denies recent illness. She reports prior hysterectomy but denies other abdominal surgeries. She denies fever.  Past Medical History  Diagnosis Date  . Depression   . Eating disorder   . Glaucoma   . Anxiety   . Hiatal hernia   . History of cardiovascular stress test 04/2000    cardiolyte  . History of flexible sigmoidoscopy 09/21/1999  . Cancer 2009    squamous cell skin   Past Surgical History  Procedure Laterality Date  . Cardiac catheterization  04/2000  . Upper gastrointestinal endoscopy  09/15/1999  . Abdominal hysterectomy    . Appendectomy    . Colonoscopy  12/31/09  . Cardiac catheterization     Family History  Problem Relation Age of Onset  . Arthritis    . Cancer      lung  . Coronary artery disease    . Stroke     History  Substance Use Topics  . Smoking status: Former Smoker -- 0.50 packs/day for 20 years    Types: Cigarettes    Quit date: 05/27/2008  . Smokeless tobacco: Never Used  . Alcohol Use: Yes     Comment: occ   OB History    No data available     Review of Systems  Constitutional: Negative for fever, chills, diaphoresis and fatigue.  HENT: Negative for  congestion, rhinorrhea and sneezing.   Eyes: Negative.   Respiratory: Negative for cough, chest tightness and shortness of breath.   Cardiovascular: Negative for chest pain and leg swelling.  Gastrointestinal: Positive for nausea and abdominal pain. Negative for vomiting, diarrhea and blood in stool.  Genitourinary: Negative for frequency, hematuria, flank pain and difficulty urinating.  Musculoskeletal: Positive for back pain. Negative for arthralgias.  Skin: Negative for rash.  Neurological: Negative for dizziness, speech difficulty, weakness, numbness and headaches.      Allergies  Penicillins and Prednisone  Home Medications   Prior to Admission medications   Medication Sig Start Date End Date Taking? Authorizing Provider  ALPRAZolam Duanne Moron) 1 MG tablet Take 1 mg by mouth 3 (three) times daily as needed. For anxiety.    Historical Provider, MD  brinzolamide (AZOPT) 1 % ophthalmic suspension Place 1 drop into both eyes 2 (two) times daily.     Historical Provider, MD  buPROPion (WELLBUTRIN SR) 150 MG 12 hr tablet Take 150 mg by mouth 2 (two) times daily. 07/07/14   Historical Provider, MD  citalopram (CELEXA) 40 MG tablet Take 40 mg by mouth daily.  07/07/14   Historical Provider, MD  colchicine 0.6 MG tablet Take 0.6 mg by mouth daily as needed. For gout flareups.  Historical Provider, MD  furosemide (LASIX) 20 MG tablet TAKE 1 TABLET BY MOUTH DAILY AS NEEDED 01/01/13   Eulas Post, MD  levobunolol (BETAGAN) 0.25 % ophthalmic solution Place 1 drop into both eyes at bedtime.    Historical Provider, MD  pantoprazole (PROTONIX) 20 MG tablet Take 1 tablet (20 mg total) by mouth daily. 10/04/14   Malvin Johns, MD  Travoprost (TRAVATAN OP) Apply to eye daily. Dr Venetia Maxon    Historical Provider, MD  traZODone (DESYREL) 100 MG tablet Take 300 mg by mouth at bedtime.     Historical Provider, MD   BP 119/61 mmHg  Pulse 62  Temp(Src) 98.7 F (37.1 C) (Oral)  Resp 18  Ht 5\' 7"   (1.702 m)  Wt 245 lb (111.131 kg)  BMI 38.36 kg/m2  SpO2 98% Physical Exam  Constitutional: She is oriented to person, place, and time. She appears well-developed and well-nourished.  obese  HENT:  Head: Normocephalic and atraumatic.  Eyes: Pupils are equal, round, and reactive to light.  Neck: Normal range of motion. Neck supple.  Cardiovascular: Normal rate, regular rhythm and normal heart sounds.   Pulmonary/Chest: Effort normal and breath sounds normal. No respiratory distress. She has no wheezes. She has no rales. She exhibits no tenderness.  Abdominal: Soft. Bowel sounds are normal. There is tenderness. There is no rebound and no guarding.  positive tenderness in the RUQ and epigastrium  Musculoskeletal: Normal range of motion. She exhibits no edema.  Lymphadenopathy:    She has no cervical adenopathy.  Neurological: She is alert and oriented to person, place, and time.  Skin: Skin is warm and dry. No rash noted.  Psychiatric: She has a normal mood and affect.    ED Course  Procedures (including critical care time)  DIAGNOSTIC STUDIES: Oxygen Saturation is 97% on room air, normal by my interpretation.    COORDINATION OF CARE: 3:28 PM Discussed treatment plan with patient at beside, including gallbladder ultrasound. The patient agrees with the plan and has no further questions at this time.   Labs Review Labs Reviewed  COMPREHENSIVE METABOLIC PANEL - Abnormal; Notable for the following:    GFR calc non Af Amer 71 (*)    GFR calc Af Amer 82 (*)    All other components within normal limits  CBC WITH DIFFERENTIAL/PLATELET - Abnormal; Notable for the following:    Monocytes Relative 13 (*)    All other components within normal limits  URINALYSIS, ROUTINE W REFLEX MICROSCOPIC  LIPASE, BLOOD    Imaging Review US Abdomen Complete  10/04/2014   CLINICAL DATA:  Right upper quadrant pain under the ribs for 2 weeks worse over the past 2 days, worse after eating  EXAM:  ULTRASOUND ABDOMEN COMPLETE  COMPARISON:  11/28/2012  FINDINGS: Gallbladder: No gallstones. No gallbladder wall thickening. Equivocal for Percell Miller sign as the patient is tender over the right upper quadrant but not directly over the gallbladder.  Common bile duct: Diameter: 2 mm  Liver: Very limited detail. Coarsened and increased echotexture. Focal abnormalities not excluded.  IVC: Not seen  Pancreas: Not seen  Spleen: Size and appearance within normal limits.  Right Kidney: Length: 10 cm. Echogenicity within normal limits. No mass or hydronephrosis visualized.  Left Kidney: Length: 11 cm. Echogenicity within normal limits. No mass or hydronephrosis visualized.  Abdominal aorta: No aneurysm visualized. However it is largely obscured by bowel gas.  Other findings: Study is significantly limited by body habitus.  IMPRESSION: 1. Patient is tender over  the right upper quadrant but not specifically over the gallbladder and therefore there is not felt to be a convincing Murphy sign. 2. Severe hepatic steatosis; focal hepatic abnormalities not excluded as a result.   Electronically Signed   By: Skipper Cliche M.D.   On: 10/04/2014 16:11     EKG Interpretation None      MDM   Final diagnoses:  RUQ abdominal pain    Pt presents with RUQ abd pain.  U/s neg for gall bladder dz.  Labs wnl.  May be gastritis.  Will start on protonix, advise bland, fat free diet, f/u with PMD, may need a HIDA scan.  Return if symptoms worsen.  I personally performed the services described in this documentation, which was scribed in my presence.  The recorded information has been reviewed and considered.   Malvin Johns, MD 10/04/14 [phone]

## 2014-10-04 NOTE — ED Notes (Signed)
Pt currently in radiology.

## 2014-10-07 ENCOUNTER — Ambulatory Visit (INDEPENDENT_AMBULATORY_CARE_PROVIDER_SITE_OTHER): Payer: 59 | Admitting: Family Medicine

## 2014-10-07 ENCOUNTER — Encounter: Payer: Self-pay | Admitting: Family Medicine

## 2014-10-07 VITALS — BP 118/80 | HR 62 | Temp 97.8°F | Wt 241.0 lb

## 2014-10-07 DIAGNOSIS — R1011 Right upper quadrant pain: Secondary | ICD-10-CM | POA: Diagnosis not present

## 2014-10-07 NOTE — Progress Notes (Signed)
   Subjective:    Patient ID: Emily [name], female    DOB: 1950-07-24, 64 y.o.   MRN: [id number]  HPI  Patient seen with progressive right quadrant pain. She's had some pain for months but especially progressive over the past couple weeks. This prompted ER visit couple days ago. Patient relates that she's had some moderate to occasionally severe pain right upper quadrant radiating to the back and is worse after eating. She has some associated nausea but no vomiting. She did take TUMS without improvement. No recent melena. No chest pains. Ultrasound abdomen revealed no acute abnormalities. Her lab work including lipase, CBC, chemistries all normal. Ultrasound did reveal some hepatic steatosis which was labeled as "severe". Patient was placed on Protonix and told to follow-up for consideration of HIDA scan. She's not had any fever or chills.  Past Medical History  Diagnosis Date  . Depression   . Eating disorder   . Glaucoma   . Anxiety   . Hiatal hernia   . History of cardiovascular stress test 04/2000    cardiolyte  . History of flexible sigmoidoscopy 09/21/1999  . Cancer 2009    squamous cell skin   Past Surgical History  Procedure Laterality Date  . Cardiac catheterization  04/2000  . Upper gastrointestinal endoscopy  09/15/1999  . Abdominal hysterectomy    . Appendectomy    . Colonoscopy  12/31/09  . Cardiac catheterization      reports that she quit smoking about 6 years ago. Her smoking use included Cigarettes. She has a 10 pack-year smoking history. She has never used smokeless tobacco. She reports that she drinks alcohol. She reports that she does not use illicit drugs. family history includes Arthritis in an other family member; Cancer in an other family member; Coronary artery disease in an other family member; Stroke in an other family member. Allergies  Allergen Reactions  . Penicillins     REACTION: rash, aggression  . Prednisone     REACTION: agressive behavior      Review of Systems  Constitutional: Negative for fever and chills.  Respiratory: Negative for cough and shortness of breath.   Cardiovascular: Negative for chest pain, palpitations and leg swelling.  Gastrointestinal: Positive for nausea and abdominal pain. Negative for vomiting.  Neurological: Negative for dizziness.       Objective:   Physical Exam  Constitutional: She appears well-developed and well-nourished. No distress.  Neck: Neck supple.  Cardiovascular: Normal rate and regular rhythm.   Pulmonary/Chest: Effort normal and breath sounds normal. No respiratory distress. She has no wheezes. She has no rales.  Abdominal: Soft. Bowel sounds are normal. She exhibits no distension and no mass. There is tenderness. There is no rebound and no guarding.  Tender right upper quadrant to palpation. No guarding  Lymphadenopathy:    She has no cervical adenopathy.  Skin: No rash noted.          Assessment & Plan:  Progressive right upper quadrant abdominal pain. Recent evaluation unremarkable. Question acalculous cholecystitis. Set up HIDA scan to further evaluate. Bland diet in meantime.  Follow up immediately for any fever or other worsening symptoms.  Recent ER notes, labs, and ultrasound all reviewed.

## 2014-10-07 NOTE — Patient Instructions (Signed)

## 2014-10-07 NOTE — Progress Notes (Signed)
Pre visit review using our clinic review tool, if applicable. No additional management support is needed unless otherwise documented below in the visit note. 

## 2014-10-22 ENCOUNTER — Telehealth: Payer: Self-pay

## 2014-10-22 NOTE — Telephone Encounter (Signed)
Emily [name] from [facility] called and needs the authorization for pt's nuclear medicine procedure.

## 2014-10-23 ENCOUNTER — Telehealth: Payer: Self-pay | Admitting: Family Medicine

## 2014-10-23 ENCOUNTER — Other Ambulatory Visit: Payer: Self-pay | Admitting: Family Medicine

## 2014-10-23 ENCOUNTER — Ambulatory Visit (HOSPITAL_COMMUNITY)
Admission: RE | Admit: 2014-10-23 | Discharge: 2014-10-23 | Disposition: A | Discharge status: Home / Self Care | Payer: 59 | Source: Ambulatory Visit | Attending: Family Medicine | Admitting: Family Medicine

## 2014-10-23 DIAGNOSIS — R1011 Right upper quadrant pain: Secondary | ICD-10-CM

## 2014-10-23 DIAGNOSIS — R110 Nausea: Secondary | ICD-10-CM | POA: Insufficient documentation

## 2014-10-23 MED ORDER — TECHNETIUM TC 99M MEBROFENIN IV KIT
5.0000 | PACK | Freq: Once | INTRAVENOUS | Status: AC | PRN
  Administered 2014-10-23: 5 via INTRAVENOUS

## 2014-10-23 MED ORDER — SINCALIDE 5 MCG IJ SOLR
0.0200 ug/kg | Freq: Once | INTRAMUSCULAR | Status: AC
  Administered 2014-10-23: 2 ug via INTRAVENOUS

## 2014-10-23 NOTE — Telephone Encounter (Signed)
Yes. We do need ejection fraction.

## 2014-10-23 NOTE — Telephone Encounter (Signed)
Emily [name] is informed.

## 2014-10-23 NOTE — Telephone Encounter (Signed)
They are doingher HIDA scan this morning. He needs to know if you want an gall bladder ejection fraction? The order they have does not include one. Pt is there and waiting.

## 2014-10-23 NOTE — Telephone Encounter (Signed)
Completed. Auth # [id number]

## 2014-11-30 ENCOUNTER — Telehealth: Payer: Self-pay | Admitting: Family Medicine

## 2014-11-30 DIAGNOSIS — R1011 Right upper quadrant pain: Secondary | ICD-10-CM

## 2014-11-30 NOTE — Telephone Encounter (Signed)
Pt continues to have stomach pain (poss gall bladder issue) and was advised you would do referral if this continued. pls advise.

## 2014-11-30 NOTE — Telephone Encounter (Signed)
i recommend follow up with GI (Dr Ardis Hughs) since she had normal [place] and HIDA scan.

## 2014-12-01 NOTE — Telephone Encounter (Signed)
Referral placed.

## 2014-12-30 ENCOUNTER — Encounter: Payer: Self-pay | Admitting: Gastroenterology

## 2015-02-08 ENCOUNTER — Other Ambulatory Visit: Payer: Self-pay

## 2015-02-08 ENCOUNTER — Encounter: Payer: Self-pay | Admitting: Gastroenterology

## 2015-02-08 DIAGNOSIS — Z1231 Encounter for screening mammogram for malignant neoplasm of breast: Secondary | ICD-10-CM

## 2015-03-18 ENCOUNTER — Ambulatory Visit
Admission: RE | Admit: 2015-03-18 | Discharge: 2015-03-18 | Disposition: A | Discharge status: Home / Self Care | Payer: 59 | Source: Ambulatory Visit

## 2015-03-18 ENCOUNTER — Ambulatory Visit: Payer: Self-pay

## 2015-03-18 DIAGNOSIS — Z1231 Encounter for screening mammogram for malignant neoplasm of breast: Secondary | ICD-10-CM

## 2015-03-30 ENCOUNTER — Ambulatory Visit (AMBULATORY_SURGERY_CENTER): Payer: Self-pay | Admitting: *Deleted

## 2015-03-30 VITALS — Ht 67.0 in | Wt 240.0 lb

## 2015-03-30 DIAGNOSIS — Z86010 Personal history of colonic polyps: Secondary | ICD-10-CM

## 2015-03-30 MED ORDER — MOVIPREP 100 G PO SOLR: 1.0000 | Freq: Once | ORAL | Status: DC

## 2015-03-30 NOTE — Progress Notes (Signed)
No egg or soy allergy. No anesthesia problems.  No home O2.  No diet meds.  

## 2015-03-31 ENCOUNTER — Ambulatory Visit (INDEPENDENT_AMBULATORY_CARE_PROVIDER_SITE_OTHER): Payer: 59 | Admitting: Adult Health

## 2015-03-31 ENCOUNTER — Encounter: Payer: Self-pay | Admitting: Adult Health

## 2015-03-31 VITALS — BP 110/78 | Temp 98.2°F | Ht 67.0 in | Wt 240.3 lb

## 2015-03-31 DIAGNOSIS — J0141 Acute recurrent pansinusitis: Secondary | ICD-10-CM

## 2015-03-31 MED ORDER — DOXYCYCLINE HYCLATE 100 MG PO CAPS: 100.0000 mg | ORAL_CAPSULE | Freq: Two times a day (BID) | ORAL | Status: DC

## 2015-03-31 NOTE — Patient Instructions (Addendum)
It was great meeting you today and I am sorry you are feeling so bad.   I have sent in a prescription for Doxycycline, take this twice a day for 7 days.   Use Flonase   Stay well hydrated and get plenty of rest.   Please let me know if you are not feeling any better in the next 2-3 days.   Sinusitis, Adult Sinusitis is redness, soreness, and inflammation of the paranasal sinuses. Paranasal sinuses are air pockets within the bones of your face. They are located beneath your eyes, in the middle of your forehead, and above your eyes. In healthy paranasal sinuses, mucus is able to drain out, and air is able to circulate through them by way of your nose. However, when your paranasal sinuses are inflamed, mucus and air can become trapped. This can allow bacteria and other germs to grow and cause infection. Sinusitis can develop quickly and last only a short time (acute) or continue over a long period (chronic). Sinusitis that lasts for more than 12 weeks is considered chronic. CAUSES Causes of sinusitis include:  Allergies.  Structural abnormalities, such as displacement of the cartilage that separates your nostrils (deviated septum), which can decrease the air flow through your nose and sinuses and affect sinus drainage.  Functional abnormalities, such as when the small hairs (cilia) that line your sinuses and help remove mucus do not work properly or are not present. SIGNS AND SYMPTOMS Symptoms of acute and chronic sinusitis are the same. The primary symptoms are pain and pressure around the affected sinuses. Other symptoms include:  Upper toothache.  Earache.  Headache.  Bad breath.  Decreased sense of smell and taste.  A cough, which worsens when you are lying flat.  Fatigue.  Fever.  Thick drainage from your nose, which often is green and may contain pus (purulent).  Swelling and warmth over the affected sinuses. DIAGNOSIS Your health care provider will perform a physical  exam. During your exam, your health care provider may perform any of the following to help determine if you have acute sinusitis or chronic sinusitis:  Look in your nose for signs of abnormal growths in your nostrils (nasal polyps).  Tap over the affected sinus to check for signs of infection.  View the inside of your sinuses using an imaging device that has a light attached (endoscope). If your health care provider suspects that you have chronic sinusitis, one or more of the following tests may be recommended:  Allergy tests.  Nasal culture. A sample of mucus is taken from your nose, sent to a lab, and screened for bacteria.  Nasal cytology. A sample of mucus is taken from your nose and examined by your health care provider to determine if your sinusitis is related to an allergy. TREATMENT Most cases of acute sinusitis are related to a viral infection and will resolve on their own within 10 days. Sometimes, medicines are prescribed to help relieve symptoms of both acute and chronic sinusitis. These may include pain medicines, decongestants, nasal steroid sprays, or saline sprays. However, for sinusitis related to a bacterial infection, your health care provider will prescribe antibiotic medicines. These are medicines that will help kill the bacteria causing the infection. Rarely, sinusitis is caused by a fungal infection. In these cases, your health care provider will prescribe antifungal medicine. For some cases of chronic sinusitis, surgery is needed. Generally, these are cases in which sinusitis recurs more than 3 times per year, despite other treatments. HOME CARE  INSTRUCTIONS  Drink plenty of water. Water helps thin the mucus so your sinuses can drain more easily.  Use a humidifier.  Inhale steam 3-4 times a day (for example, sit in the bathroom with the shower running).  Apply a warm, moist washcloth to your face 3-4 times a day, or as directed by your health care provider.  Use  saline nasal sprays to help moisten and clean your sinuses.  Take medicines only as directed by your health care provider.  If you were prescribed either an antibiotic or antifungal medicine, finish it all even if you start to feel better. SEEK IMMEDIATE MEDICAL CARE IF:  You have increasing pain or severe headaches.  You have nausea, vomiting, or drowsiness.  You have swelling around your face.  You have vision problems.  You have a stiff neck.  You have difficulty breathing.   This information is not intended to replace advice given to you by your health care provider. Make sure you discuss any questions you have with your health care provider.   Document Released: 06/05/2005 Document Revised: 06/26/2014 Document Reviewed: 06/20/2011 Elsevier Interactive Patient Education [employer].

## 2015-03-31 NOTE — Progress Notes (Signed)
Pre visit review using our clinic review tool, if applicable. No additional management support is needed unless otherwise documented below in the visit note. 

## 2015-03-31 NOTE — Progress Notes (Signed)
Subjective:    Patient ID: Emily [name], female    DOB: 01-Oct-1950, 64 y.o.   MRN: [id number]  HPI  64 year old female who presents to the office today for two weeks of sinusitis type symptoms. She endorses a headache, itchy, watery eyes, sinus pain and pressure with drainage and bilateral ear pain. She endorses fatigue and muscle aches as well as a dry cough  Review of Systems  Constitutional: Positive for fatigue. Negative for fever, chills and diaphoresis.  HENT: Positive for congestion, ear pain, postnasal drip, rhinorrhea, sinus pressure and sore throat. Negative for ear discharge, tinnitus, trouble swallowing and voice change.   Eyes: Positive for discharge and itching.  Respiratory: Positive for cough. Negative for shortness of breath.   Neurological: Positive for headaches. Negative for dizziness, weakness and light-headedness.  All other systems reviewed and are negative.  Past Medical History  Diagnosis Date  . Depression   . Eating disorder   . Glaucoma   . Anxiety   . Hiatal hernia   . History of cardiovascular stress test 04/2000    cardiolyte  . History of flexible sigmoidoscopy 09/21/1999  . Cancer ([place]) 2009    squamous cell skin  . Allergy     seasonal   . Cataract     bilateral surgery, left unsuccessful  . GERD (gastroesophageal reflux disease)     Social History   Social History  . Marital Status: Married    Spouse Name: N/A  . Number of Children: N/A  . Years of Education: N/A   Occupational History  . Not on file.   Social History Main Topics  . Smoking status: Former Smoker -- 0.50 packs/day for 20 years    Types: Cigarettes    Quit date: 05/27/2008  . Smokeless tobacco: Never Used  . Alcohol Use: 0.0 oz/week    0 Standard drinks or equivalent per week     Comment: occ  . Drug Use: No  . Sexual Activity: Not on file   Other Topics Concern  . Not on file   Social History Narrative    Past Surgical History  Procedure Laterality  Date  . Cardiac catheterization  04/2000  . Upper gastrointestinal endoscopy  09/15/1999  . Abdominal hysterectomy    . Appendectomy    . Colonoscopy  12/31/09  . Cardiac catheterization      Family History  Problem Relation Age of Onset  . Arthritis    . Cancer      lung  . Coronary artery disease    . Stroke    . Colon cancer Neg Hx     Allergies  Allergen Reactions  . Penicillins     REACTION: rash, aggression  . Prednisone     REACTION: agressive behavior    Current Outpatient Prescriptions on File Prior to Visit  Medication Sig Dispense Refill  . ALPRAZolam (XANAX) 1 MG tablet Take 1 mg by mouth 3 (three) times daily as needed. For anxiety.    . brinzolamide (AZOPT) 1 % ophthalmic suspension Place 1 drop into both eyes 2 (two) times daily.     [name] buPROPion (WELLBUTRIN SR) 150 MG 12 hr tablet Take 150 mg by mouth 2 (two) times daily.  6  . citalopram (CELEXA) 40 MG tablet Take 40 mg by mouth daily.   6  . colchicine 0.6 MG tablet Take 0.6 mg by mouth daily as needed. For gout flareups.     . furosemide (LASIX) 20 MG tablet  TAKE 1 TABLET BY MOUTH DAILY AS NEEDED 30 tablet 1  . loratadine (CLARITIN) 10 MG tablet Take 10 mg by mouth daily.    [name] MOVIPREP 100 G SOLR Take 1 kit (200 g total) by mouth once. Name brand only, movi prep as directed, no substitutions. 1 kit 0  . traZODone (DESYREL) 100 MG tablet Take 300 mg by mouth at bedtime.      No current facility-administered medications on file prior to visit.    BP 110/78 mmHg  Temp(Src) 98.2 F (36.8 C) (Oral)  Ht _0  (1.702 m)  Wt 240 lb 4.8 oz (108.999 kg)  BMI 37.63 kg/m2        Objective:   Physical Exam  Constitutional: She is oriented to person, place, and time. She appears well-developed and well-nourished. No distress.  HENT:  Head: Normocephalic and atraumatic.  Right Ear: External ear normal.  Left Ear: External ear normal.  Nose: Nose normal.  Mouth/Throat: Oropharynx is clear and moist. No  oropharyngeal exudate.  TM's visualized. No signs of infection  Eyes: Conjunctivae and EOM are normal. Pupils are equal, round, and reactive to light. Right eye exhibits no discharge. Left eye exhibits no discharge. No scleral icterus.  Neck: Normal range of motion. Neck supple. No tracheal deviation present. Thyromegaly present.  Cardiovascular: Normal rate, regular rhythm, normal heart sounds and intact distal pulses.  Exam reveals no gallop and no friction rub.   No murmur heard. Pulmonary/Chest: Effort normal and breath sounds normal. No respiratory distress. She has no wheezes. She has no rales. She exhibits no tenderness.  Dry cough  Musculoskeletal: Normal range of motion. She exhibits no edema or tenderness.  Lymphadenopathy:    She has cervical adenopathy.  Neurological: She is alert and oriented to person, place, and time.  Skin: Skin is warm and dry. No rash noted. She is not diaphoretic. No erythema. No pallor.  Psychiatric: She has a normal mood and affect. Her behavior is normal. Judgment and thought content normal.  Nursing note and vitals reviewed.      Assessment & Plan:  1. Acute recurrent pansinusitis - doxycycline (VIBRAMYCIN) 100 MG capsule; Take 1 capsule (100 mg total) by mouth 2 (two) times daily.  Dispense: 14 capsule; Refill: 0 - Flonase - Tylenol for body aches.  - Follow up in 2-3 days if needed

## 2015-04-12 ENCOUNTER — Encounter: Payer: Self-pay | Admitting: Gastroenterology

## 2015-04-14 ENCOUNTER — Encounter: Payer: Self-pay | Admitting: Gastroenterology

## 2015-04-14 ENCOUNTER — Ambulatory Visit (AMBULATORY_SURGERY_CENTER): Payer: 59 | Admitting: Gastroenterology

## 2015-04-14 VITALS — BP 116/76 | HR 67 | Temp 97.0°F | Resp 16 | Ht 67.0 in | Wt 240.0 lb

## 2015-04-14 DIAGNOSIS — Z86010 Personal history of colonic polyps: Secondary | ICD-10-CM | POA: Diagnosis present

## 2015-04-14 MED ORDER — SODIUM CHLORIDE 0.9 % IV SOLN: 500.0000 mL | INTRAVENOUS | Status: DC

## 2015-04-14 NOTE — Op Note (Signed)
[facility]  [address] [place], [id number]   COLONOSCOPY PROCEDURE REPORT  PATIENT: Emily [name], Emily [name]  MR#: [id number] BIRTHDATE: Nov 28, 1950 , 6  yrs. old GENDER: female ENDOSCOPIST: Milus Banister, MD PROCEDURE DATE:  04/14/2015 PROCEDURE:   Colonoscopy, surveillance First Screening Colonoscopy - Avg.  risk and is 50 yrs.  old or older - No.  Prior Negative Screening - Now for repeat screening. N/A  History of Adenoma - Now for follow-up colonoscopy & has been > or = to 3 yrs.  Yes hx of adenoma.  Has been 3 or more years since last colonoscopy.  Recommend repeat exam, <10 yrs? No ASA CLASS:   Class II INDICATIONS:Surveillance due to prior colonic neoplasia and Colonoscopy Dr.  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 MEDICATIONS: Monitored anesthesia care, Propofol 150 mg IV, and lidocaine 40mg  IV  DESCRIPTION OF PROCEDURE:   After the risks benefits and alternatives of the procedure were thoroughly explained, informed consent was obtained.  The digital rectal exam revealed no abnormalities of the rectum.   The [id number]  endoscope was introduced through the anus and advanced to the cecum, which was identified by both the appendix and ileocecal valve. No adverse events experienced.   The quality of the prep was excellent.  The instrument was then slowly withdrawn as the colon was fully examined. Estimated blood loss is zero unless otherwise noted in this procedure report.   COLON FINDINGS: There was mild diverticulosis noted in the left colon.   The examination was otherwise normal.  Retroflexed views revealed no abnormalities. The time to cecum = 1.5 Withdrawal time = 6.3   The scope was withdrawn and the procedure completed. COMPLICATIONS: There were no immediate complications.  ENDOSCOPIC IMPRESSION: 1.   Mild diverticulosis was noted in the left colon 2.   The examination was otherwise normal  RECOMMENDATIONS: You should continue to follow  colorectal cancer screening guidelines for "routine risk" patients with a repeat colonoscopy in 10 years.   eSigned:  Milus Banister, MD 04/14/2015 10:23 AM   cc: Carolann Littler, MD

## 2015-04-14 NOTE — Patient Instructions (Signed)
YOU HAD AN ENDOSCOPIC PROCEDURE TODAY AT THE [facility]:   Refer to the procedure report that was given to you for any specific questions about what was found during the examination.  If the procedure report does not answer your questions, please call your gastroenterologist to clarify.  If you requested that your care partner not be given the details of your procedure findings, then the procedure report has been included in a sealed envelope for you to review at your convenience later.  YOU SHOULD EXPECT: Some feelings of bloating in the abdomen. Passage of more gas than usual.  Walking can help get rid of the air that was put into your GI tract during the procedure and reduce the bloating. If you had a lower endoscopy (such as a colonoscopy or flexible sigmoidoscopy) you may notice spotting of blood in your stool or on the toilet paper. If you underwent a bowel prep for your procedure, you may not have a normal bowel movement for a few days.  Please Note:  You might notice some irritation and congestion in your nose or some drainage.  This is from the oxygen used during your procedure.  There is no need for concern and it should clear up in a day or so.  SYMPTOMS TO REPORT IMMEDIATELY:   Following lower endoscopy (colonoscopy or flexible sigmoidoscopy):  Excessive amounts of blood in the stool  Significant tenderness or worsening of abdominal pains  Swelling of the abdomen that is new, acute  Fever of 100F or higher    For urgent or emergent issues, a gastroenterologist can be reached at any hour by calling [phone].   DIET: Your first meal following the procedure should be a small meal and then it is ok to progress to your normal diet. Heavy or fried foods are harder to digest and may make you feel nauseous or bloated.  Likewise, meals heavy in dairy and vegetables can increase bloating.  Drink plenty of fluids but you should avoid alcoholic beverages for 24  hours.  ACTIVITY:  You should plan to take it easy for the rest of today and you should NOT DRIVE or use heavy machinery until tomorrow (because of the sedation medicines used during the test).    FOLLOW UP: Our staff will call the number listed on your records the next business day following your procedure to check on you and address any questions or concerns that you may have regarding the information given to you following your procedure. If we do not reach you, we will leave a message.  However, if you are feeling well and you are not experiencing any problems, there is no need to return our call.  We will assume that you have returned to your regular daily activities without incident.  If any biopsies were taken you will be contacted by phone or by letter within the next 1-3 weeks.  Please call us at [phone] if you have not heard about the biopsies in 3 weeks.    SIGNATURES/CONFIDENTIALITY: You and/or your care partner have signed paperwork which will be entered into your electronic medical record.  These signatures attest to the fact that that the information above on your After Visit Summary has been reviewed and is understood.  Full responsibility of the confidentiality of this discharge information lies with you and/or your care-partner.   Information on diverticulosis and high fiber diet given to you today 

## 2015-04-14 NOTE — Progress Notes (Signed)
Stable to RR 

## 2015-04-15 ENCOUNTER — Telehealth: Payer: Self-pay

## 2015-04-15 NOTE — Telephone Encounter (Signed)
  Follow up Call-  Call back number 04/14/2015  Post procedure Call Back phone  # [phone]  Permission to leave phone message Yes     Patient questions:  Do you have a fever, pain , or abdominal swelling? No. Pain Score  0 *  Have you tolerated food without any problems? Yes.    Have you been able to return to your normal activities? Yes.    Do you have any questions about your discharge instructions: Diet   No. Medications  No. Follow up visit  No.  Do you have questions or concerns about your Care? No.  Actions: * If pain score is 4 or above: No action needed, pain <4.  No problems per the pt.  "And thank you for the great service" per the pt. maw

## 2015-06-25 DIAGNOSIS — H4423 Degenerative myopia, bilateral: Secondary | ICD-10-CM | POA: Diagnosis not present

## 2015-06-25 DIAGNOSIS — H401132 Primary open-angle glaucoma, bilateral, moderate stage: Secondary | ICD-10-CM | POA: Diagnosis not present

## 2015-08-23 DIAGNOSIS — D100 Benign neoplasm of lip: Secondary | ICD-10-CM | POA: Diagnosis not present

## 2015-11-08 ENCOUNTER — Encounter: Payer: Self-pay | Admitting: Family Medicine

## 2015-11-08 ENCOUNTER — Ambulatory Visit (INDEPENDENT_AMBULATORY_CARE_PROVIDER_SITE_OTHER): Payer: PPO | Admitting: Family Medicine

## 2015-11-08 VITALS — BP 142/100 | HR 84 | Temp 98.3°F | Ht 67.0 in | Wt 242.9 lb

## 2015-11-08 DIAGNOSIS — R030 Elevated blood-pressure reading, without diagnosis of hypertension: Secondary | ICD-10-CM

## 2015-11-08 DIAGNOSIS — IMO0001 Reserved for inherently not codable concepts without codable children: Secondary | ICD-10-CM

## 2015-11-08 NOTE — Patient Instructions (Signed)
DASH Eating Plan DASH stands for "Dietary Approaches to Stop Hypertension." The DASH eating plan is a healthy eating plan that has been shown to reduce high blood pressure (hypertension). Additional health benefits may include reducing the risk of type 2 diabetes mellitus, heart disease, and stroke. The DASH eating plan may also help with weight loss. WHAT DO I NEED TO KNOW ABOUT THE DASH EATING PLAN? For the DASH eating plan, you will follow these general guidelines:  Choose foods with a percent daily value for sodium of less than 5% (as listed on the food label).  Use salt-free seasonings or herbs instead of table salt or sea salt.  Check with your health care provider or pharmacist before using salt substitutes.  Eat lower-sodium products, often labeled as "lower sodium" or "no salt added."  Eat fresh foods.  Eat more vegetables, fruits, and low-fat dairy products.  Choose whole grains. Look for the word "whole" as the first word in the ingredient list.  Choose fish and skinless chicken or turkey more often than red meat. Limit fish, poultry, and meat to 6 oz (170 g) each day.  Limit sweets, desserts, sugars, and sugary drinks.  Choose heart-healthy fats.  Limit cheese to 1 oz (28 g) per day.  Eat more home-cooked food and less restaurant, buffet, and fast food.  Limit fried foods.  Cook foods using methods other than frying.  Limit canned vegetables. If you do use them, rinse them well to decrease the sodium.  When eating at a restaurant, ask that your food be prepared with less salt, or no salt if possible. WHAT FOODS CAN I EAT? Seek help from a dietitian for individual calorie needs. Grains Whole grain or whole wheat bread. Brown rice. Whole grain or whole wheat pasta. Quinoa, bulgur, and whole grain cereals. Low-sodium cereals. Corn or whole wheat flour tortillas. Whole grain cornbread. Whole grain crackers. Low-sodium crackers. Vegetables Fresh or frozen vegetables  (raw, steamed, roasted, or grilled). Low-sodium or reduced-sodium tomato and vegetable juices. Low-sodium or reduced-sodium tomato sauce and paste. Low-sodium or reduced-sodium canned vegetables.  Fruits All fresh, canned (in natural juice), or frozen fruits. Meat and Other Protein Products Ground beef (85% or leaner), grass-fed beef, or beef trimmed of fat. Skinless chicken or turkey. Ground chicken or turkey. Pork trimmed of fat. All fish and seafood. Eggs. Dried beans, peas, or lentils. Unsalted nuts and seeds. Unsalted canned beans. Dairy Low-fat dairy products, such as skim or 1% milk, 2% or reduced-fat cheeses, low-fat ricotta or cottage cheese, or plain low-fat yogurt. Low-sodium or reduced-sodium cheeses. Fats and Oils Tub margarines without trans fats. Light or reduced-fat mayonnaise and salad dressings (reduced sodium). Avocado. Safflower, olive, or canola oils. Natural peanut or almond butter. Other Unsalted popcorn and pretzels. The items listed above may not be a complete list of recommended foods or beverages. Contact your dietitian for more options. WHAT FOODS ARE NOT RECOMMENDED? Grains White bread. White pasta. White rice. Refined cornbread. Bagels and croissants. Crackers that contain trans fat. Vegetables Creamed or fried vegetables. Vegetables in a cheese sauce. Regular canned vegetables. Regular canned tomato sauce and paste. Regular tomato and vegetable juices. Fruits Dried fruits. Canned fruit in light or heavy syrup. Fruit juice. Meat and Other Protein Products Fatty cuts of meat. Ribs, chicken wings, bacon, sausage, bologna, salami, chitterlings, fatback, hot dogs, bratwurst, and packaged luncheon meats. Salted nuts and seeds. Canned beans with salt. Dairy Whole or 2% milk, cream, half-and-half, and cream cheese. Whole-fat or sweetened yogurt. Full-fat   cheeses or blue cheese. Nondairy creamers and whipped toppings. Processed cheese, cheese spreads, or cheese  curds. Condiments Onion and garlic salt, seasoned salt, table salt, and sea salt. Canned and packaged gravies. Worcestershire sauce. Tartar sauce. Barbecue sauce. Teriyaki sauce. Soy sauce, including reduced sodium. Steak sauce. Fish sauce. Oyster sauce. Cocktail sauce. Horseradish. Ketchup and mustard. Meat flavorings and tenderizers. Bouillon cubes. Hot sauce. Tabasco sauce. Marinades. Taco seasonings. Relishes. Fats and Oils Butter, stick margarine, lard, shortening, ghee, and bacon fat. Coconut, palm kernel, or palm oils. Regular salad dressings. Other Pickles and olives. Salted popcorn and pretzels. The items listed above may not be a complete list of foods and beverages to avoid. Contact your dietitian for more information. WHERE CAN I FIND MORE INFORMATION? National Heart, Lung, and Blood Institute: travelstabloid.com   This information is not intended to replace advice given to you by your health care provider. Make sure you discuss any questions you have with your health care provider.   Document Released: 05/25/2011 Document Revised: 06/26/2014 Document Reviewed: 04/09/2013 Elsevier Interactive Patient Education 2016 Else[employer] Inc.  Monitor blood pressure and be in touch if consistently > 150/90 Set up complete physical.

## 2015-11-08 NOTE — Progress Notes (Signed)
Pre visit review using our clinic tool,if applicable. No additional management support is needed unless otherwise documented below in the visit note.  
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
## 2016-01-21 ENCOUNTER — Ambulatory Visit (INDEPENDENT_AMBULATORY_CARE_PROVIDER_SITE_OTHER): Payer: PPO | Admitting: Family Medicine

## 2016-01-21 VITALS — BP 110/80 | HR 110 | Temp 98.0°F | Ht 67.0 in | Wt 240.0 lb

## 2016-01-21 DIAGNOSIS — H409 Unspecified glaucoma: Secondary | ICD-10-CM

## 2016-01-21 DIAGNOSIS — M109 Gout, unspecified: Secondary | ICD-10-CM

## 2016-01-21 DIAGNOSIS — J019 Acute sinusitis, unspecified: Secondary | ICD-10-CM

## 2016-01-21 MED ORDER — AMOXICILLIN-POT CLAVULANATE 875-125 MG PO TABS: 1.0000 | ORAL_TABLET | Freq: Two times a day (BID) | ORAL | 0 refills | Status: DC

## 2016-01-21 MED ORDER — COLCHICINE 0.6 MG PO TABS: 0.6000 mg | ORAL_TABLET | Freq: Two times a day (BID) | ORAL | 3 refills | Status: DC | PRN

## 2016-01-21 NOTE — Progress Notes (Signed)
Subjective:     Patient ID: Emily [name], female   DOB: [birth year], 65 y.o.   MRN: [id number]  HPI Patient seen for several things as follows  2 week history of sinusitis type symptoms. She has especially pressure right frontal sinus. Said some postnasal drainage. Intermittent dry cough. Increased malaise. Intermittent headaches. Taking Claritin and Mucinex without improvement.  Patient states she has glaucoma history. Requesting referral to another ophthalmologist. Her current ophthalmologist is on medical leave and she is also requesting change out of her preference.  Patient requesting refill of colchicine for acute gout flareups. This has worked well for her in the past.    Dealing with stress of daughter recently diagnosed with "cirrhosis". She's not sure of etiology.  Past Medical History:  Diagnosis Date  . Allergy    seasonal   . Anxiety   . Cancer ([place]) 2009   squamous cell skin  . Cataract    bilateral surgery, left unsuccessful  . Depression   . Eating disorder   . GERD (gastroesophageal reflux disease)   . Glaucoma   . Hiatal hernia   . History of cardiovascular stress test 04/2000   cardiolyte  . History of flexible sigmoidoscopy 09/21/1999   Past Surgical History:  Procedure Laterality Date  . ABDOMINAL HYSTERECTOMY    . APPENDECTOMY    . CARDIAC CATHETERIZATION  04/2000  . CARDIAC CATHETERIZATION    . COLONOSCOPY  12/31/09  . UPPER GASTROINTESTINAL ENDOSCOPY  09/15/1999    reports that she quit smoking about 7 years ago. Her smoking use included Cigarettes. She has a 10.00 pack-year smoking history. She has never used smokeless tobacco. She reports that she drinks alcohol. She reports that she does not use drugs. family history is not on file. Allergies  Allergen Reactions  . Penicillins     REACTION: rash, aggression  . Prednisone     REACTION: agressive behavior     Review of Systems  Constitutional: Positive for fatigue.  HENT: Positive for  congestion and sinus pressure. Negative for ear pain.   Eyes: Negative for visual disturbance.  Respiratory: Positive for cough. Negative for chest tightness, shortness of breath and wheezing.   Cardiovascular: Negative for chest pain, palpitations and leg swelling.  Neurological: Negative for dizziness, seizures, syncope, weakness, light-headedness and headaches.       Objective:   Physical Exam  Constitutional: She appears well-developed and well-nourished.  HENT:  Right Ear: External ear normal.  Left Ear: External ear normal.  Mouth/Throat: Oropharynx is clear and moist.  Neck: Neck supple.  Cardiovascular: Normal rate and regular rhythm.   Pulmonary/Chest: Effort normal and breath sounds normal. No respiratory distress. She has no wheezes. She has no rales.  Musculoskeletal: She exhibits no edema.  Lymphadenopathy:    She has no cervical adenopathy.       Assessment:     #1 acute sinusitis  #2 reported history of glaucoma. We do not have any firm documentation  #3 gout      Plan:     -Set up referral to ophthalmologist per her request -Refill colchicine for as needed use -Schedule complete physical -Augmentin 875 mg twice daily for 10 days. Continue Mucinex to leave off Claritin  Eulas Post MD [facility] at [facility]

## 2016-01-21 NOTE — Patient Instructions (Signed)
Set up complete physical We will set up eye exam

## 2016-01-21 NOTE — Progress Notes (Signed)
Pre visit review using our clinic review tool, if applicable. No additional management support is needed unless otherwise documented below in the visit note. 

## 2016-01-27 ENCOUNTER — Other Ambulatory Visit (INDEPENDENT_AMBULATORY_CARE_PROVIDER_SITE_OTHER): Payer: PPO

## 2016-01-27 DIAGNOSIS — Z0000 Encounter for general adult medical examination without abnormal findings: Secondary | ICD-10-CM | POA: Diagnosis not present

## 2016-01-27 LAB — LIPID PANEL
Cholesterol: 160 mg/dL (ref 0–200)
HDL: 54.1 mg/dL (ref >39.00)
LDL CALC: 77 mg/dL (ref 0–99)
NONHDL: 106.04
TRIGLYCERIDES: 144 mg/dL (ref 0.0–149.0)
Total CHOL/HDL Ratio: 3
VLDL: 28.8 mg/dL (ref 0.0–40.0)

## 2016-01-27 LAB — CBC WITH DIFFERENTIAL/PLATELET
BASOS PCT: 0.4 % (ref 0.0–3.0)
Basophils Absolute: 0 10*3/uL (ref 0.0–0.1)
EOS ABS: 0.1 10*3/uL (ref 0.0–0.7)
Eosinophils Relative: 2.4 % (ref 0.0–5.0)
HCT: 42.1 % (ref 36.0–46.0)
Hemoglobin: 14.6 g/dL (ref 12.0–15.0)
LYMPHS ABS: 1.6 10*3/uL (ref 0.7–4.0)
Lymphocytes Relative: 33.1 % (ref 12.0–46.0)
MCHC: 34.6 g/dL (ref 30.0–36.0)
MCV: 89 fl (ref 78.0–100.0)
MONO ABS: 0.5 10*3/uL (ref 0.1–1.0)
Monocytes Relative: 10.2 % (ref 3.0–12.0)
NEUTROS ABS: 2.6 10*3/uL (ref 1.4–7.7)
NEUTROS PCT: 53.9 % (ref 43.0–77.0)
PLATELETS: 244 10*3/uL (ref 150.0–400.0)
RBC: 4.73 Mil/uL (ref 3.87–5.11)
RDW: 13.5 % (ref 11.5–15.5)
WBC: 4.8 10*3/uL (ref 4.0–10.5)

## 2016-01-27 LAB — TSH: TSH: 3.1 u[IU]/mL (ref 0.35–4.50)

## 2016-01-27 LAB — BASIC METABOLIC PANEL
BUN: 21 mg/dL (ref 6–23)
CALCIUM: 9.5 mg/dL (ref 8.4–10.5)
CHLORIDE: 103 meq/L (ref 96–112)
CO2: 29 meq/L (ref 19–32)
CREATININE: 1.04 mg/dL (ref 0.40–1.20)
GFR: 56.39 mL/min — ABNORMAL LOW (ref >60.00)
Glucose, Bld: 99 mg/dL (ref 70–99)
Potassium: 4.4 mEq/L (ref 3.5–5.1)
Sodium: 140 mEq/L (ref 135–145)

## 2016-01-27 LAB — HEPATIC FUNCTION PANEL
ALT: 24 U/L (ref 0–35)
AST: 17 U/L (ref 0–37)
Albumin: 3.9 g/dL (ref 3.5–5.2)
Alkaline Phosphatase: 53 U/L (ref 39–117)
BILIRUBIN DIRECT: 0.1 mg/dL (ref 0.0–0.3)
BILIRUBIN TOTAL: 0.5 mg/dL (ref 0.2–1.2)
TOTAL PROTEIN: 6.6 g/dL (ref 6.0–8.3)

## 2016-01-29 ENCOUNTER — Other Ambulatory Visit: Payer: Self-pay

## 2016-01-29 ENCOUNTER — Emergency Department (HOSPITAL_COMMUNITY): Payer: PPO

## 2016-01-29 ENCOUNTER — Emergency Department (HOSPITAL_COMMUNITY)
Admission: EM | Admit: 2016-01-29 | Discharge: 2016-01-30 | Disposition: A | Discharge status: Home / Self Care | Payer: PPO | Attending: Emergency Medicine | Admitting: Emergency Medicine

## 2016-01-29 ENCOUNTER — Encounter (HOSPITAL_COMMUNITY): Payer: Self-pay | Admitting: *Deleted

## 2016-01-29 DIAGNOSIS — Z79899 Other long term (current) drug therapy: Secondary | ICD-10-CM | POA: Insufficient documentation

## 2016-01-29 DIAGNOSIS — Z87891 Personal history of nicotine dependence: Secondary | ICD-10-CM | POA: Insufficient documentation

## 2016-01-29 DIAGNOSIS — R0789 Other chest pain: Secondary | ICD-10-CM | POA: Diagnosis not present

## 2016-01-29 DIAGNOSIS — R079 Chest pain, unspecified: Secondary | ICD-10-CM | POA: Diagnosis not present

## 2016-01-29 DIAGNOSIS — Z85831 Personal history of malignant neoplasm of soft tissue: Secondary | ICD-10-CM | POA: Diagnosis not present

## 2016-01-29 DIAGNOSIS — R072 Precordial pain: Secondary | ICD-10-CM | POA: Diagnosis not present

## 2016-01-29 DIAGNOSIS — R05 Cough: Secondary | ICD-10-CM | POA: Diagnosis not present

## 2016-01-29 LAB — BASIC METABOLIC PANEL
ANION GAP: 9 (ref 5–15)
BUN: 17 mg/dL (ref 6–20)
CALCIUM: 9 mg/dL (ref 8.9–10.3)
CO2: 24 mmol/L (ref 22–32)
CREATININE: 0.88 mg/dL (ref 0.44–1.00)
Chloride: 103 mmol/L (ref 101–111)
GFR calc Af Amer: >60 mL/min (ref >60)
GLUCOSE: 151 mg/dL — AB (ref 65–99)
Potassium: 3.6 mmol/L (ref 3.5–5.1)
Sodium: 136 mmol/L (ref 135–145)

## 2016-01-29 LAB — CBC
HCT: 42.5 % (ref 36.0–46.0)
Hemoglobin: 14.2 g/dL (ref 12.0–15.0)
MCH: 30.5 pg (ref 26.0–34.0)
MCHC: 33.4 g/dL (ref 30.0–36.0)
MCV: 91.2 fL (ref 78.0–100.0)
PLATELETS: 244 10*3/uL (ref 150–400)
RBC: 4.66 MIL/uL (ref 3.87–5.11)
RDW: 13.1 % (ref 11.5–15.5)
WBC: 5.1 10*3/uL (ref 4.0–10.5)

## 2016-01-29 LAB — I-STAT TROPONIN, ED
TROPONIN I, POC: 0.01 ng/mL (ref 0.00–0.08)
Troponin i, poc: 0 ng/mL (ref 0.00–0.08)

## 2016-01-29 NOTE — Discharge Instructions (Signed)
It was our pleasure to provide your ER care today - we hope that you feel better.  Follow up with your doctor and/or cardiologist (see referral) in the coming week - call Monday to arrange follow up - discuss possible stress testing.  Take tylenol/advil as need.  If gi symptoms/reflux, try pepcid and maalox as need for symptom relief.   Return to ER right away if worse, recurrent/persistent chest pain, trouble breathing, weak/fainting, other concern.

## 2016-01-29 NOTE — ED Notes (Signed)
Patient transported to X-ray 

## 2016-01-29 NOTE — ED Triage Notes (Signed)
Approximately 10 minutes prior to ED arrival pt had a sudden onset of central cramping CP radiating to back. Pt states she started to feel lightheaded and dizzy. Pt had just finished dinner prior to chest pain.

## 2016-01-29 NOTE — ED Provider Notes (Signed)
[facility] DEPT Provider Note   CSN: [id number] Arrival date & time: 01/29/16  2023  First Provider Contact:  First MD Initiated Contact with Patient 01/29/16 2130        History   Chief Complaint Chief Complaint  Patient presents with  . Chest Pain  . Back Pain    HPI Emily [name] is a 65 y.o. female.  Patient with onset chest pain, midline, upper sternal area, while at rest/sitting tonight.  Initially lasted 1-2 minutes.  Since then has had several other episodes at rest, each lasting a few seconds. No palpitations or rapid heart beat. No associated nv, or diaphoresis. Had eaten dinner shortly prior to onset pain. Hx HH, but no heartburn tonight. No chest wall injury or strain. No constant pain, no pleuritic pain. No leg pain or swelling. No hx cad. No fam hx premature cad. No hx dvt or pe. No recent immobility, trauma, travel or surgery. +recent non prod cough, rhinorrhea - pt indicates put on abx for 'sinus'. No hemoptysis.  Denies fever or chills. No recent unusual doe or fatigue.       Chest Pain   Associated symptoms include back pain and cough. Pertinent negatives include no abdominal pain, no fever, no headaches, no nausea, no palpitations, no shortness of breath and no vomiting.  Back Pain   Associated symptoms include chest pain. Pertinent negatives include no fever, no headaches and no abdominal pain.    Past Medical History:  Diagnosis Date  . Allergy    seasonal   . Anxiety   . Cancer ([place]) 2009   squamous cell skin  . Cataract    bilateral surgery, left unsuccessful  . Depression   . Eating disorder   . GERD (gastroesophageal reflux disease)   . Glaucoma   . Hiatal hernia   . History of cardiovascular stress test 04/2000   cardiolyte  . History of flexible sigmoidoscopy 09/21/1999    Patient Active Problem List   Diagnosis Date Noted  . Ankle sprain 05/18/2011  . Chest wall soft tissue injury 05/18/2011  . Sprain of foot, left 05/18/2011  .  VIRAL URI 12/01/2009  . ARTHRALGIA 11/10/2009  . WEIGHT GAIN 11/10/2009  . DYSPNEA 11/10/2009  . INGROWN NAIL 05/19/2009  . DEPRESSION 09/07/2008  . FOOT PAIN, BILATERAL 09/07/2008    Past Surgical History:  Procedure Laterality Date  . ABDOMINAL HYSTERECTOMY    . APPENDECTOMY    . CARDIAC CATHETERIZATION  04/2000  . CARDIAC CATHETERIZATION    . COLONOSCOPY  12/31/09  . UPPER GASTROINTESTINAL ENDOSCOPY  09/15/1999    OB History    No data available       Home Medications    Prior to Admission medications   Medication Sig Start Date End Date Taking? Authorizing Provider  ALPRAZolam Duanne Moron) 1 MG tablet Take 1 mg by mouth 3 (three) times daily as needed. For anxiety.   Yes Historical Provider, MD  amoxicillin-clavulanate (AUGMENTIN) 875-125 MG tablet Take 1 tablet by mouth 2 (two) times daily. 01/21/16  Yes Eulas Post, MD  brinzolamide (AZOPT) 1 % ophthalmic suspension Place 1 drop into both eyes 2 (two) times daily.    Yes Historical Provider, MD  buPROPion (WELLBUTRIN SR) 150 MG 12 hr tablet Take 150 mg by mouth 2 (two) times daily. 07/07/14  Yes Historical Provider, MD  citalopram (CELEXA) 40 MG tablet Take 40 mg by mouth daily.  07/07/14  Yes Historical Provider, MD  colchicine 0.6 MG tablet Take 1  tablet (0.6 mg total) by mouth 2 (two) times daily as needed. For gout flareups. 01/21/16  Yes Eulas Post, MD  furosemide (LASIX) 20 MG tablet TAKE 1 TABLET BY MOUTH DAILY AS NEEDED 01/01/13  Yes Eulas Post, MD  loratadine (CLARITIN) 10 MG tablet Take 10 mg by mouth daily.   Yes Historical Provider, MD  traZODone (DESYREL) 100 MG tablet Take 300 mg by mouth at bedtime.    Yes Historical Provider, MD    Family History Family History  Problem Relation Age of Onset  . Arthritis    . Cancer      lung  . Coronary artery disease    . Stroke    . Colon cancer Neg Hx     Social History Social History  Substance Use Topics  . Smoking status: Former Smoker     Packs/day: 0.50    Years: 20.00    Types: Cigarettes    Quit date: 05/27/2008  . Smokeless tobacco: Never Used  . Alcohol use 0.0 oz/week     Comment: occ     Allergies   Penicillins and Prednisone   Review of Systems Review of Systems  Constitutional: Negative for chills and fever.  HENT: Positive for rhinorrhea. Negative for sore throat.   Eyes: Negative for redness.  Respiratory: Positive for cough. Negative for shortness of breath.   Cardiovascular: Positive for chest pain. Negative for palpitations and leg swelling.  Gastrointestinal: Negative for abdominal pain, nausea and vomiting.  Genitourinary: Negative for flank pain.  Musculoskeletal: Positive for back pain. Negative for neck pain.  Skin: Negative for rash.  Neurological: Negative for syncope and headaches.  Hematological: Does not bruise/bleed easily.  Psychiatric/Behavioral: Negative for confusion.     Physical Exam Updated Vital Signs BP 123/56   Pulse 72   Temp 97.7 F (36.5 C) (Oral)   Resp 16   SpO2 96%   Physical Exam  Constitutional: She appears well-developed and well-nourished. No distress.  HENT:  Mouth/Throat: Oropharynx is clear and moist.  Eyes: Conjunctivae are normal. No scleral icterus.  Neck: Neck supple. No tracheal deviation present.  Cardiovascular: Normal rate, regular rhythm, normal heart sounds and intact distal pulses.  Exam reveals no gallop and no friction rub.   No murmur heard. Pulmonary/Chest: Effort normal and breath sounds normal. No respiratory distress. She exhibits no tenderness.  Abdominal: Soft. Normal appearance and bowel sounds are normal. She exhibits no distension. There is no tenderness.  Musculoskeletal: She exhibits no edema or tenderness.  Neurological: She is alert.  Skin: Skin is warm and dry. No rash noted.  Psychiatric: She has a normal mood and affect.  Nursing note and vitals reviewed.    ED Treatments / Results  Labs (all labs ordered are  listed, but only abnormal results are displayed) Results for orders placed or performed during the hospital encounter of [id number]  Basic metabolic panel  Result Value Ref Range   Sodium 136 135 - 145 mmol/L   Potassium 3.6 3.5 - 5.1 mmol/L   Chloride 103 101 - 111 mmol/L   CO2 24 22 - 32 mmol/L   Glucose, Bld 151 (H) 65 - 99 mg/dL   BUN 17 6 - 20 mg/dL   Creatinine, Ser 0.88 0.44 - 1.00 mg/dL   Calcium 9.0 8.9 - 10.3 mg/dL   GFR calc non Af Amer >60 >60 mL/min   GFR calc Af Amer >60 >60 mL/min   Anion gap 9 5 - 15  CBC  Result Value Ref Range   WBC 5.1 4.0 - 10.5 K/uL   RBC 4.66 3.87 - 5.11 MIL/uL   Hemoglobin 14.2 12.0 - 15.0 g/dL   HCT 42.5 36.0 - 46.0 %   MCV 91.2 78.0 - 100.0 fL   MCH 30.5 26.0 - 34.0 pg   MCHC 33.4 30.0 - 36.0 g/dL   RDW 13.1 11.5 - 15.5 %   Platelets 244 150 - 400 K/uL  I-stat troponin, ED  Result Value Ref Range   Troponin i, poc 0.01 0.00 - 0.08 ng/mL   Comment 3          I-stat troponin, ED  Result Value Ref Range   Troponin i, poc 0.00 0.00 - 0.08 ng/mL   Comment 3           Dg Chest 2 View  Result Date: 01/29/2016 CLINICAL DATA:  Acute onset of left-sided chest pain and dizziness. Cough. Initial encounter. EXAM: CHEST  2 VIEW COMPARISON:  Chest radiograph performed 10/06/2013 FINDINGS: The lungs are well-aerated and clear. There is no evidence of focal opacification, pleural effusion or pneumothorax. The heart is normal in size; the mediastinal contour is within normal limits. No acute osseous abnormalities are seen. IMPRESSION: No acute cardiopulmonary process seen. Electronically Signed   By: Garald Balding M.D.   On: 01/29/2016 21:14    EKG  EKG Interpretation  Date/Time:  Saturday January 29 2016 21:38:57 EDT Ventricular Rate:  64 PR Interval:    QRS Duration: 91 QT Interval:  418 QTC Calculation: 432 R Axis:   2 Text Interpretation:  Sinus rhythm Low voltage, precordial leads No significant change since last tracing Confirmed by Ashok Cordia   MD, Lennette Bihari ([id number]) on 01/29/2016 9:52:45 PM       Radiology Dg Chest 2 View  Result Date: 01/29/2016 CLINICAL DATA:  Acute onset of left-sided chest pain and dizziness. Cough. Initial encounter. EXAM: CHEST  2 VIEW COMPARISON:  Chest radiograph performed 10/06/2013 FINDINGS: The lungs are well-aerated and clear. There is no evidence of focal opacification, pleural effusion or pneumothorax. The heart is normal in size; the mediastinal contour is within normal limits. No acute osseous abnormalities are seen. IMPRESSION: No acute cardiopulmonary process seen. Electronically Signed   By: Garald Balding M.D.   On: 01/29/2016 21:14    Procedures Procedures (including critical care time)  Medications Ordered in ED Medications - No data to display   Initial Impression / Assessment and Plan / ED Course  I have reviewed the triage vital signs and the nursing notes.  Pertinent labs & imaging results that were available during my care of the patient were reviewed by me and considered in my medical decision making (see chart for details).  Clinical Course    Iv ns. Continuous pulse ox and monitor. Labs. Ecg.  Initial trop neg.  Recheck no cp, but has had recurrent pain since initial assessment, each lasting seconds.   Pain is atypical and felt not c/w acs.  Repeat trop pending.  Repeat trop neg. Patient pain free. No increased wob.    Final Clinical Impressions(s) / ED Diagnoses   Final diagnoses:  None    New Prescriptions New Prescriptions   No medications on file     Lajean Saver, MD 01/29/16 2354

## 2016-01-30 NOTE — ED Notes (Signed)
Pt provided with d/c instructions at this time. Pt verbalizes understanding of d/c instructions as well as follow up procedure after d/c.  No new RX provided at time of d/c.  Pt assisted out of the ED via [place] at this time.  Pt in no apparent distress at this time.

## 2016-02-02 ENCOUNTER — Ambulatory Visit (INDEPENDENT_AMBULATORY_CARE_PROVIDER_SITE_OTHER): Payer: PPO | Admitting: Family Medicine

## 2016-02-02 ENCOUNTER — Encounter: Payer: Self-pay | Admitting: Family Medicine

## 2016-02-02 VITALS — BP 130/86 | HR 108 | Temp 98.0°F | Ht 66.25 in | Wt 243.0 lb

## 2016-02-02 DIAGNOSIS — Z23 Encounter for immunization: Secondary | ICD-10-CM | POA: Diagnosis not present

## 2016-02-02 DIAGNOSIS — Z0000 Encounter for general adult medical examination without abnormal findings: Secondary | ICD-10-CM

## 2016-02-02 DIAGNOSIS — Z780 Asymptomatic menopausal state: Secondary | ICD-10-CM | POA: Diagnosis not present

## 2016-02-02 NOTE — Progress Notes (Signed)
Pre visit review using our clinic review tool, if applicable. No additional management support is needed unless otherwise documented below in the visit note. 

## 2016-02-02 NOTE — Progress Notes (Signed)
Subjective:     Patient ID: Emily [name], female   DOB: [birth year], 65 y.o.   MRN: [id number]  HPI Patient seen for complete physical. She has history of obesity, recurrent depression followed by psychiatry. Recent episode which prompted ER visit Saturday night of atypical chest pain. Troponins negative. EKG unremarkable. Patient thinks this might have been anxiety related. She's had no episodes since then. No recent exertional chest pains  She's had previous hysterectomy for benign disease. She gets yearly mammograms. Colonoscopy up-to-date. No history of shingles vaccine. Never had bone density scan. Needs Prevnar 13. Gets yearly flu vaccines. Very sedentary. Very little exercise.  Followed by psychiatry for depression and anxiety but does not take her medications regularly  Past Medical History:  Diagnosis Date  . Allergy    seasonal   . Anxiety   . Cancer ([place]) 2009   squamous cell skin  . Cataract    bilateral surgery, left unsuccessful  . Depression   . Eating disorder   . GERD (gastroesophageal reflux disease)   . Glaucoma   . Hiatal hernia   . History of cardiovascular stress test 04/2000   cardiolyte  . History of flexible sigmoidoscopy 09/21/1999   Past Surgical History:  Procedure Laterality Date  . ABDOMINAL HYSTERECTOMY    . APPENDECTOMY    . CARDIAC CATHETERIZATION  04/2000  . CARDIAC CATHETERIZATION    . COLONOSCOPY  12/31/09  . UPPER GASTROINTESTINAL ENDOSCOPY  09/15/1999    reports that she quit smoking about 7 years ago. Her smoking use included Cigarettes. She has a 10.00 pack-year smoking history. She has never used smokeless tobacco. She reports that she drinks alcohol. She reports that she does not use drugs. family history is not on file. Allergies  Allergen Reactions  . Penicillins     REACTION: rash, aggression Has patient had a PCN reaction causing immediate rash, facial/tongue/throat swelling, SOB or lightheadedness with hypotension: YES Has  patient had a PCN reaction causing severe rash involving mucus membranes or skin necrosis: NO Has patient had a PCN reaction that required hospitalizationNO Has patient had a PCN reaction occurring within the last 10 years:NO If all of the above answers are "NO", then may proceed with Cephalosporin use.   . Prednisone     REACTION: agressive behavior     Review of Systems  Constitutional: Positive for fatigue. Negative for activity change, appetite change, fever and unexpected weight change.  HENT: Negative for ear pain, hearing loss, sore throat and trouble swallowing.   Eyes: Negative for visual disturbance.  Respiratory: Negative for cough and shortness of breath.   Cardiovascular: Negative for chest pain (not since last weekend) and palpitations.  Gastrointestinal: Negative for abdominal pain, blood in stool, constipation and diarrhea.  Endocrine: Negative for polydipsia and polyuria.  Genitourinary: Negative for dysuria and hematuria.  Musculoskeletal: Positive for arthralgias. Negative for back pain and myalgias.  Skin: Negative for rash.  Neurological: Negative for dizziness, syncope and headaches.  Hematological: Negative for adenopathy.  Psychiatric/Behavioral: Negative for confusion and dysphoric mood.       Objective:   Physical Exam  Constitutional: She is oriented to person, place, and time. She appears well-developed and well-nourished. No distress.  HENT:  Right Ear: External ear normal.  Left Ear: External ear normal.  Mouth/Throat: Oropharynx is clear and moist.  Neck: Neck supple. No thyromegaly present.  Cardiovascular: Normal rate and regular rhythm.   No murmur heard. Pulmonary/Chest: Effort normal and breath sounds normal. No respiratory distress.  She has no wheezes. She has no rales.  Abdominal: Soft. Bowel sounds are normal. She exhibits no distension and no mass. There is no tenderness. There is no rebound and no guarding.  Musculoskeletal: She exhibits  no edema.  Lymphadenopathy:    She has no cervical adenopathy.  Neurological: She is alert and oriented to person, place, and time. No cranial nerve deficit.  Skin: No rash noted.  No concerning skin lesions noted  Psychiatric: She has a normal mood and affect. Her behavior is normal. Judgment and thought content normal.       Assessment:     Physical exam. Patient has obesity but fortunately no history of hypertension, diabetes, or significant dyslipidemia    Plan:     -Labs reviewed with no major concerns -Prevnar 13 given and recommended Pneumovax in 1 year -Set up baseline DEXA scan -Check on insurance coverage for shingles vaccine -Continue yearly mammogram. -Colonoscopy up-to-date. -Reminder for flu vaccine this fall -She is encouraged to lose some weight. -Encouraged close follow-up with psychiatry -Follow-up for any recurrent chest pains  Eulas Post MD  Primary Care at [facility]

## 2016-02-02 NOTE — Patient Instructions (Signed)
-  Remember flu shot later this fall -You received Prevnar 13 today and recommend Pneumovax in 1 year -Check on coverage for shingles vaccine if interested -Set up bone density scan at checkout -Try to lose some weight -Continue with yearly mammogram

## 2016-02-11 ENCOUNTER — Inpatient Hospital Stay: Admission: RE | Admit: 2016-02-11 | Payer: Self-pay | Source: Ambulatory Visit

## 2016-02-23 ENCOUNTER — Ambulatory Visit (INDEPENDENT_AMBULATORY_CARE_PROVIDER_SITE_OTHER): Payer: PPO | Admitting: Emergency Medicine

## 2016-02-23 DIAGNOSIS — Z23 Encounter for immunization: Secondary | ICD-10-CM

## 2016-04-27 ENCOUNTER — Other Ambulatory Visit: Payer: Self-pay | Admitting: Family Medicine

## 2016-04-27 DIAGNOSIS — Z1231 Encounter for screening mammogram for malignant neoplasm of breast: Secondary | ICD-10-CM

## 2016-05-02 DIAGNOSIS — Z961 Presence of intraocular lens: Secondary | ICD-10-CM | POA: Diagnosis not present

## 2016-05-02 DIAGNOSIS — Z0100 Encounter for examination of eyes and vision without abnormal findings: Secondary | ICD-10-CM | POA: Diagnosis not present

## 2016-05-02 DIAGNOSIS — H40011 Open angle with borderline findings, low risk, right eye: Secondary | ICD-10-CM | POA: Diagnosis not present

## 2016-05-02 DIAGNOSIS — H40012 Open angle with borderline findings, low risk, left eye: Secondary | ICD-10-CM | POA: Diagnosis not present

## 2016-05-29 ENCOUNTER — Ambulatory Visit
Admission: RE | Admit: 2016-05-29 | Discharge: 2016-05-29 | Disposition: A | Discharge status: Home / Self Care | Payer: PPO | Source: Ambulatory Visit | Attending: Family Medicine | Admitting: Family Medicine

## 2016-05-29 DIAGNOSIS — Z1231 Encounter for screening mammogram for malignant neoplasm of breast: Secondary | ICD-10-CM | POA: Diagnosis not present

## 2016-06-05 ENCOUNTER — Ambulatory Visit (INDEPENDENT_AMBULATORY_CARE_PROVIDER_SITE_OTHER): Payer: PPO | Admitting: Family Medicine

## 2016-06-05 ENCOUNTER — Encounter: Payer: Self-pay | Admitting: Family Medicine

## 2016-06-05 VITALS — BP 122/80 | HR 77 | Temp 97.7°F | Ht 66.25 in | Wt 242.1 lb

## 2016-06-05 DIAGNOSIS — J988 Other specified respiratory disorders: Secondary | ICD-10-CM

## 2016-06-05 DIAGNOSIS — H9202 Otalgia, left ear: Secondary | ICD-10-CM | POA: Diagnosis not present

## 2016-06-05 MED ORDER — CEFDINIR 300 MG PO CAPS: 300.0000 mg | ORAL_CAPSULE | Freq: Two times a day (BID) | ORAL | 0 refills | Status: DC

## 2016-06-05 NOTE — Progress Notes (Signed)
HPI:  URI -started: about 1 week ago and feels is worsening -symptoms:nasal congestion, sore throat, cough - thick nasal discharge and sputum, L ear pain, chills at times -denies:fever, SOB, NVD, tooth pain -has tried: musinex which helps th cough -sick contacts/travel/risks: no reported flu, strep or tick exposure -Hx of: allergies -reports penicillin allergy but reports has taken augmentin but prefers not to  ROS: See pertinent positives and negatives per HPI.  Past Medical History:  Diagnosis Date  . Allergy    seasonal   . Anxiety   . Cancer ([place]) 2009   squamous cell skin  . Cataract    bilateral surgery, left unsuccessful  . Depression   . Eating disorder   . GERD (gastroesophageal reflux disease)   . Glaucoma   . Hiatal hernia   . History of cardiovascular stress test 04/2000   cardiolyte  . History of flexible sigmoidoscopy 09/21/1999    Past Surgical History:  Procedure Laterality Date  . ABDOMINAL HYSTERECTOMY    . APPENDECTOMY    . CARDIAC CATHETERIZATION  04/2000  . CARDIAC CATHETERIZATION    . COLONOSCOPY  12/31/09  . UPPER GASTROINTESTINAL ENDOSCOPY  09/15/1999    Family History  Problem Relation Age of Onset  . Arthritis    . Cancer      lung  . Coronary artery disease    . Stroke    . Colon cancer Neg Hx     Social History   Social History  . Marital status: Married    Spouse name: N/A  . Number of children: N/A  . Years of education: N/A   Social History Main Topics  . Smoking status: Former Smoker    Packs/day: 0.50    Years: 20.00    Types: Cigarettes    Quit date: 05/27/2008  . Smokeless tobacco: Never Used  . Alcohol use 0.0 oz/week     Comment: occ  . Drug use: No  . Sexual activity: Not Asked   Other Topics Concern  . None   Social History Narrative  . None     Current Outpatient Prescriptions:  .  ALPRAZolam (XANAX) 1 MG tablet, Take 1 mg by mouth 3 (three) times daily as needed. For anxiety., Disp: , Rfl:  .   buPROPion (WELLBUTRIN SR) 150 MG 12 hr tablet, Take 150 mg by mouth 2 (two) times daily., Disp: , Rfl: 6 .  citalopram (CELEXA) 40 MG tablet, Take 40 mg by mouth daily. , Disp: , Rfl: 6 .  colchicine 0.6 MG tablet, Take 1 tablet (0.6 mg total) by mouth 2 (two) times daily as needed. For gout flareups., Disp: 60 tablet, Rfl: 3 .  furosemide (LASIX) 20 MG tablet, TAKE 1 TABLET BY MOUTH DAILY AS NEEDED, Disp: 30 tablet, Rfl: 1 .  loratadine (CLARITIN) 10 MG tablet, Take 10 mg by mouth daily., Disp: , Rfl:  .  traZODone (DESYREL) 100 MG tablet, Take 300 mg by mouth at bedtime. , Disp: , Rfl:  .  cefdinir (OMNICEF) 300 MG capsule, Take 1 capsule (300 mg total) by mouth 2 (two) times daily., Disp: 14 capsule, Rfl: 0  EXAM:  Vitals:   06/05/16 1638  BP: 122/80  Pulse: 77  Temp: 97.7 F (36.5 C)    Body mass index is 38.78 kg/m.  GENERAL: vitals reviewed and listed above, alert, oriented, appears well hydrated and in no acute distress  HEENT: atraumatic, conjunttiva clear, no obvious abnormalities on inspection of external nose and ears, normal  appearance of ear canals and TMs - except for clear effusion R and discolored effusion L with some redness, bulging and dullness of theTM, clear nasal congestion, mild post oropharyngeal erythema with PND, no tonsillar edema or exudate, no sinus TTP  NECK: no obvious masses on inspection  LUNGS: clear to auscultation bilaterally, no wheezes, rales or rhonchi, good air movement  CV: HRRR, no peripheral edema  MS: moves all extremities without noticeable abnormality  PSYCH: pleasant and cooperative, no obvious depression or anxiety  ASSESSMENT AND PLAN:  Discussed the following assessment and plan:  Respiratory infection  Ear pain, left  -given HPI and exam findings today, a serious infection or illness is unlikely. We discussed potential etiologies, with VURI being most likely but with potential development OM and possible developing  sinusitis. She opted to start an abx for theses concerns and after a discussion of risks/allergies and interactions opted for cefdinir.We discussed treatment side effects, likely course, and signs of developing a worsening or serious illness. -of course, we advised to return or notify a doctor immediately if symptoms worsen or persist or new concerns arise.    Patient Instructions  Take the antibiotic as instructed.  -plenty of rest and fluids  -nasal saline wash 2-3 times daily (use prepackaged nasal saline or bottled/distilled water if making your own)   -can use tylenol (in no history of liver disease) or ibuprofen (if no history of kidney disease, bowel bleeding or significant heart disease) as directed for aches and sorethroat  -in the winter time, using a humidifier at night is helpful (please follow cleaning instructions)  -if you are taking a cough medication - use only as directed, may also try a teaspoon of honey to coat the throat and throat lozenges.  -for sore throat, salt water gargles can help  -follow up if you have fevers, facial pain, tooth pain, difficulty breathing or are worsening or symptoms persist longer then expected  Upper Respiratory Infection, Adult An upper respiratory infection (URI) is also known as the common cold. It is often caused by a type of germ (virus). Colds are easily spread (contagious). You can pass it to others by kissing, coughing, sneezing, or drinking out of the same glass. Usually, you get better in 1 to 3  weeks.  However, the cough can last for even longer. HOME CARE   Only take medicine as told by your doctor. Follow instructions provided above.  Drink enough water and fluids to keep your pee (urine) clear or pale yellow.  Get plenty of rest.  Return to work when your temperature is < 100 for 24 hours or as told by your doctor. You may use a face mask and wash your hands to stop your cold from spreading. GET HELP RIGHT AWAY IF:    After the first few days, you feel you are getting worse.  You have questions about your medicine.  You have chills, shortness of breath, or red spit (mucus).  You have pain in the face for more then 1-2 days, especially when you bend forward.  You have a fever, puffy (swollen) neck, pain when you swallow, or white spots in the back of your throat.  You have a bad headache, ear pain, sinus pain, or chest pain.  You have a high-pitched whistling sound when you breathe in and out (wheezing).  You cough up blood.  You have sore muscles or a stiff neck. MAKE SURE YOU:   Understand these instructions.  Will watch your condition.  Will get help right away if you are not doing well or get worse. Document Released: 11/22/2007 Document Revised: 08/28/2011 Document Reviewed: 09/10/2013 [facility] Patient Information 2015 [place], [place]. This information is not intended to replace advice given to you by your health care provider. Make sure you discuss any questions you have with your health care provider.    Colin Benton R., DO

## 2016-06-05 NOTE — Progress Notes (Signed)
Pre visit review using our clinic review tool, if applicable. No additional management support is needed unless otherwise documented below in the visit note. 

## 2016-06-05 NOTE — Patient Instructions (Signed)
Take the antibiotic as instructed.  -plenty of rest and fluids  -nasal saline wash 2-3 times daily (use prepackaged nasal saline or bottled/distilled water if making your own)   -can use tylenol (in no history of liver disease) or ibuprofen (if no history of kidney disease, bowel bleeding or significant heart disease) as directed for aches and sorethroat  -in the winter time, using a humidifier at night is helpful (please follow cleaning instructions)  -if you are taking a cough medication - use only as directed, may also try a teaspoon of honey to coat the throat and throat lozenges.  -for sore throat, salt water gargles can help  -follow up if you have fevers, facial pain, tooth pain, difficulty breathing or are worsening or symptoms persist longer then expected  Upper Respiratory Infection, Adult An upper respiratory infection (URI) is also known as the common cold. It is often caused by a type of germ (virus). Colds are easily spread (contagious). You can pass it to others by kissing, coughing, sneezing, or drinking out of the same glass. Usually, you get better in 1 to 3  weeks.  However, the cough can last for even longer. HOME CARE   Only take medicine as told by your doctor. Follow instructions provided above.  Drink enough water and fluids to keep your pee (urine) clear or pale yellow.  Get plenty of rest.  Return to work when your temperature is < 100 for 24 hours or as told by your doctor. You may use a face mask and wash your hands to stop your cold from spreading. GET HELP RIGHT AWAY IF:   After the first few days, you feel you are getting worse.  You have questions about your medicine.  You have chills, shortness of breath, or red spit (mucus).  You have pain in the face for more then 1-2 days, especially when you bend forward.  You have a fever, puffy (swollen) neck, pain when you swallow, or white spots in the back of your throat.  You have a bad headache, ear  pain, sinus pain, or chest pain.  You have a high-pitched whistling sound when you breathe in and out (wheezing).  You cough up blood.  You have sore muscles or a stiff neck. MAKE SURE YOU:   Understand these instructions.  Will watch your condition.  Will get help right away if you are not doing well or get worse. Document Released: 11/22/2007 Document Revised: 08/28/2011 Document Reviewed: 09/10/2013 [facility] Patient Information 2015 [place], [place]. This information is not intended to replace advice given to you by your health care provider. Make sure you discuss any questions you have with your health care provider.

## 2016-06-06 ENCOUNTER — Ambulatory Visit: Payer: Self-pay | Admitting: Family Medicine

## 2016-09-05 ENCOUNTER — Encounter: Payer: Self-pay | Admitting: Family Medicine

## 2016-09-05 ENCOUNTER — Ambulatory Visit (INDEPENDENT_AMBULATORY_CARE_PROVIDER_SITE_OTHER): Payer: PPO | Admitting: Family Medicine

## 2016-09-05 VITALS — BP 110/70 | HR 58 | Temp 98.4°F | Wt 238.5 lb

## 2016-09-05 DIAGNOSIS — J019 Acute sinusitis, unspecified: Secondary | ICD-10-CM | POA: Diagnosis not present

## 2016-09-05 DIAGNOSIS — R1011 Right upper quadrant pain: Secondary | ICD-10-CM

## 2016-09-05 DIAGNOSIS — K760 Fatty (change of) liver, not elsewhere classified: Secondary | ICD-10-CM

## 2016-09-05 MED ORDER — DOXYCYCLINE HYCLATE 100 MG PO CAPS: 100.0000 mg | ORAL_CAPSULE | Freq: Two times a day (BID) | ORAL | 0 refills | Status: DC

## 2016-09-05 NOTE — Progress Notes (Signed)
Pre visit review using our clinic review tool, if applicable. No additional management support is needed unless otherwise documented below in the visit note. 

## 2016-09-05 NOTE — Progress Notes (Signed)
Subjective:     Patient ID: Emily [name], female   DOB: January 20, 1951, 66 y.o.   MRN: [id number]  HPI Patient seen for the following issues  2 week history of progressive bilateral maxillary and frontal sinus pressure. She's had some ear fullness past couple days. Increased malaise. Intermittent headaches. Occasional postnasal drainage. She's tried over-the-counter medications without any alleviation.  Denies any fever or chills  Patient relates right upper quadrant abdominal pain intermittently past month. She's had very similar symptoms the past and had extensive workup. Ultrasound was unremarkable. Gallbladder nuclear medicine ejection fraction was normal. Current pain is very similar. Her pain is intermittent. There is between sharp and ill and also varies tremendously induration from a few seconds to sometimes 30 minutes or an hour. Not consistently related eating. No cough. No pleuritic pain. No clear triggering factors. Intensity is 7 out of 10 at its worst.  She has history of known hepatic steatosis from previous ultrasound.  Past Medical History:  Diagnosis Date  . Allergy    seasonal   . Anxiety   . Cancer ([place]) 2009   squamous cell skin  . Cataract    bilateral surgery, left unsuccessful  . Depression   . Eating disorder   . GERD (gastroesophageal reflux disease)   . Glaucoma   . Hiatal hernia   . History of cardiovascular stress test 04/2000   cardiolyte  . History of flexible sigmoidoscopy 09/21/1999   Past Surgical History:  Procedure Laterality Date  . ABDOMINAL HYSTERECTOMY    . APPENDECTOMY    . CARDIAC CATHETERIZATION  04/2000  . CARDIAC CATHETERIZATION    . COLONOSCOPY  12/31/09  . UPPER GASTROINTESTINAL ENDOSCOPY  09/15/1999    reports that she quit smoking about 8 years ago. Her smoking use included Cigarettes. She has a 10.00 pack-year smoking history. She has never used smokeless tobacco. She reports that she drinks alcohol. She reports that she does not use  drugs. family history is not on file. Allergies  Allergen Reactions  . Penicillins     REACTION: rash, aggression Has patient had a PCN reaction causing immediate rash, facial/tongue/throat swelling, SOB or lightheadedness with hypotension: YES Has patient had a PCN reaction causing severe rash involving mucus membranes or skin necrosis: NO Has patient had a PCN reaction that required hospitalizationNO Has patient had a PCN reaction occurring within the last 10 years:NO If all of the above answers are "NO", then may proceed with Cephalosporin use.   . Prednisone     REACTION: agressive behavior     Review of Systems  Constitutional: Positive for fatigue. Negative for chills, fever and unexpected weight change.  HENT: Positive for congestion, sinus pain and sinus pressure.   Respiratory: Positive for cough.   Cardiovascular: Negative for chest pain.  Gastrointestinal: Positive for abdominal pain. Negative for nausea and vomiting.       Objective:   Physical Exam  Constitutional: She appears well-developed and well-nourished. No distress.  HENT:  Right Ear: External ear normal.  Left Ear: External ear normal.  Mouth/Throat: Oropharynx is clear and moist.  Neck: Neck supple.  Cardiovascular: Normal rate and regular rhythm.   Pulmonary/Chest: Effort normal and breath sounds normal. No respiratory distress. She has no wheezes. She has no rales.  Abdominal: Soft. She exhibits no distension and no mass. There is no tenderness. There is no rebound and no guarding.  Lymphadenopathy:    She has no cervical adenopathy.       Assessment:     #  1 probable bilateral frontal and maxillary sinusitis  #2 recurrent right upper quadrant abdominal pain with previous extensive workup as above unrevealing. She does not have any red flags such as fever, appetite change, weight loss and nonfocal exam at this time  #3 hepatic steatosis    Plan:     -Doxycycline 100 mg twice daily for 10  days -Strongly encouraged to lose some weight -Follow-up immediately for any fever, vomiting, or any worsening abdominal pain -Consider GI referral if abdominal pain persists -We discussed potential long-term complications of fatty liver if untreated.  Eulas Post MD  Primary Care at [facility]

## 2016-09-05 NOTE — Patient Instructions (Signed)

## 2016-10-23 ENCOUNTER — Ambulatory Visit (INDEPENDENT_AMBULATORY_CARE_PROVIDER_SITE_OTHER): Payer: PPO | Admitting: Family Medicine

## 2016-10-23 VITALS — BP 110/80 | HR 84 | Temp 98.6°F | Wt 245.4 lb

## 2016-10-23 DIAGNOSIS — R1011 Right upper quadrant pain: Secondary | ICD-10-CM

## 2016-10-23 DIAGNOSIS — H9201 Otalgia, right ear: Secondary | ICD-10-CM | POA: Diagnosis not present

## 2016-10-23 DIAGNOSIS — J310 Chronic rhinitis: Secondary | ICD-10-CM

## 2016-10-23 DIAGNOSIS — K760 Fatty (change of) liver, not elsewhere classified: Secondary | ICD-10-CM

## 2016-10-23 NOTE — Progress Notes (Signed)
Pre visit review using our clinic review tool, if applicable. No additional management support is needed unless otherwise documented below in the visit note. 

## 2016-10-23 NOTE — Patient Instructions (Signed)
Add Flonase 2 sprays per nostril once daily Continue with the [place] We will set up GI referral regarding your chronic upper abdominal pain.

## 2016-10-23 NOTE — Progress Notes (Signed)
Subjective:     Patient ID: Emily [name], female   DOB: [birth year], 66 y.o.   MRN: [id number]  HPI Patient seen today for the following issues  Possible bleeding from the right ear canal. She woke this morning with sensation of some "fullness" in the canal. She used a Q-tip and after going inside her ear came out and saw what she thought maybe some blood on the tip.  No active bleeding since then. No vertigo. No hearing loss. Mild ear pain.  Second issue is she's had several week history of sinus congestion and occasional facial pain. She has frequent sneezing. She's tried over-the-counter antihistamine without much relief. She has not tried any nasal steroids. Denies any purulent secretions. No headaches. No fever.  Third issue is that she's had some chronic right upper quadrant abdominal pain for least a few years. She had ultrasound and gallbladder nuclear study couple years ago which did not show any significant abnormalities. Her pains have been somewhat inconsistent. She complains of a sharp pain which may be possibly worse after eating but not consistently. Denies any radiation of pain. No alleviating factors. No GERD symptoms. Denies any nausea or vomiting. No appetite loss and no weight loss.  No chest pain. She does have evidence for hepatic steatosis from previous ultrasound.    Past Medical History:  Diagnosis Date  . Allergy    seasonal   . Anxiety   . Cancer ([place]) 2009   squamous cell skin  . Cataract    bilateral surgery, left unsuccessful  . Depression   . Eating disorder   . GERD (gastroesophageal reflux disease)   . Glaucoma   . Hiatal hernia   . History of cardiovascular stress test 04/2000   cardiolyte  . History of flexible sigmoidoscopy 09/21/1999   Past Surgical History:  Procedure Laterality Date  . ABDOMINAL HYSTERECTOMY    . APPENDECTOMY    . CARDIAC CATHETERIZATION  04/2000  . CARDIAC CATHETERIZATION    . COLONOSCOPY  12/31/09  . UPPER GASTROINTESTINAL  ENDOSCOPY  09/15/1999    reports that she quit smoking about 8 years ago. Her smoking use included Cigarettes. She has a 10.00 pack-year smoking history. She has never used smokeless tobacco. She reports that she drinks alcohol. She reports that she does not use drugs. family history is not on file. Allergies  Allergen Reactions  . Doxycycline     Gi upset  . Penicillins     REACTION: rash, aggression Has patient had a PCN reaction causing immediate rash, facial/tongue/throat swelling, SOB or lightheadedness with hypotension: YES Has patient had a PCN reaction causing severe rash involving mucus membranes or skin necrosis: NO Has patient had a PCN reaction that required hospitalizationNO Has patient had a PCN reaction occurring within the last 10 years:NO If all of the above answers are "NO", then may proceed with Cephalosporin use.   . Prednisone     REACTION: agressive behavior     Review of Systems  Constitutional: Negative for appetite change, chills, fever and unexpected weight change.  HENT: Positive for ear pain. Negative for hearing loss and trouble swallowing.   Respiratory: Negative for cough and shortness of breath.   Cardiovascular: Negative for chest pain.  Gastrointestinal: Positive for abdominal pain. Negative for nausea and vomiting.       Objective:   Physical Exam  Constitutional: She appears well-developed and well-nourished.  HENT:  Left Ear: External ear normal.  Mouth/Throat: Oropharynx is clear and moist.  Right  eardrum and canal are normal with the exception that she has some pinkish colored fairly thin liquidy material on the posterior margin (of canal). Used a Q-tip and gently brushed over this area and this does not appear consistent with likely blood. Eardrum was completely normal with no signs of perforation or trauma. No active bleeding within the canal.  Neck: Neck supple.  Cardiovascular: Normal rate and regular rhythm.   Pulmonary/Chest: Effort  normal and breath sounds normal. No respiratory distress. She has no wheezes. She has no rales.  Abdominal: Soft. Bowel sounds are normal. She exhibits no distension and no mass. There is tenderness. There is no rebound and no guarding.  Right upper quadrant tenderness to palpation. No guarding or rebound. No mass.  Lymphadenopathy:    She has no cervical adenopathy.       Assessment:     #1 discharge right ear canal. This does not appear to be consistent with bleeding. She has pinkish colored substance which does not look compatible with blood.  No evidence for canal or eardrum trauma.  #2 persistent rhinitis symptoms. Probably allergic  #3 chronic right upper quadrant abdominal pain with previous unremarkable ultrasound and gallbladder nuclear study. She does not have any appetite or weight loss or other red flags  #4 history of hepatic steatosis from ultrasound    Plan:     -Avoid Q-tips or other foreign bodies to ear canal -Add Flonase or Nasacort to her over-the-counter antihistamine for nasal congestive symptoms -Set up GI referral regarding her somewhat chronic right upper quadrant abdominal pain -Strongly advise weight loss  Eulas Post MD [place] [facility]

## 2016-11-16 DIAGNOSIS — H40053 Ocular hypertension, bilateral: Secondary | ICD-10-CM | POA: Diagnosis not present

## 2016-12-27 ENCOUNTER — Encounter: Payer: Self-pay | Admitting: Family Medicine

## 2017-05-22 ENCOUNTER — Encounter: Payer: Self-pay | Admitting: Family Medicine

## 2017-05-22 ENCOUNTER — Ambulatory Visit: Payer: PPO | Admitting: Adult Health

## 2017-05-22 ENCOUNTER — Encounter: Payer: Self-pay | Admitting: Adult Health

## 2017-05-22 VITALS — BP 110/84 | Temp 98.1°F | Wt 248.0 lb

## 2017-05-22 DIAGNOSIS — L2489 Irritant contact dermatitis due to other agents: Secondary | ICD-10-CM | POA: Diagnosis not present

## 2017-05-22 DIAGNOSIS — J0141 Acute recurrent pansinusitis: Secondary | ICD-10-CM | POA: Diagnosis not present

## 2017-05-22 MED ORDER — AZITHROMYCIN 250 MG PO TABS: ORAL_TABLET | ORAL | 0 refills | Status: DC

## 2017-05-22 MED ORDER — TRIAMCINOLONE ACETONIDE 0.5 % EX OINT: 1.0000 "application " | TOPICAL_OINTMENT | Freq: Two times a day (BID) | CUTANEOUS | 0 refills | Status: DC

## 2017-05-22 NOTE — Progress Notes (Signed)
Subjective:    Patient ID: Emily [name], female    DOB: [birth year], 66 y.o.   MRN: [id number]  Sinusitis  This is a new problem. The current episode started 1 to 4 weeks ago (10 days ). The problem has been gradually worsening since onset. There has been no fever. Associated symptoms include congestion, coughing, ear pain, headaches, sinus pressure and a sore throat. Pertinent negatives include no chills, diaphoresis, neck pain, shortness of breath or swollen glands. Past treatments include oral decongestants. The treatment provided no relief.   Also reports red itchy rash on bilateral legs. Has been present for one week. She has been using OTC hydrocortisone cream. Denies any changes in soaps or detergents. She has found that the rash is worse after she wears pantyhose.   Review of Systems  Constitutional: Positive for activity change and fatigue. Negative for chills, diaphoresis and fever.  HENT: Positive for congestion, ear pain, postnasal drip, rhinorrhea, sinus pressure, sinus pain and sore throat. Negative for ear discharge.   Respiratory: Positive for cough. Negative for shortness of breath and wheezing.   Cardiovascular: Negative.   Gastrointestinal: Positive for diarrhea, nausea and vomiting.  Musculoskeletal: Negative for neck pain.  Neurological: Positive for headaches.   Past Medical History:  Diagnosis Date  . Allergy    seasonal   . Anxiety   . Cancer ([place]) 2009   squamous cell skin  . Cataract    bilateral surgery, left unsuccessful  . Depression   . Eating disorder   . GERD (gastroesophageal reflux disease)   . Glaucoma   . Hiatal hernia   . History of cardiovascular stress test 04/2000   cardiolyte  . History of flexible sigmoidoscopy 09/21/1999    Social History   Socioeconomic History  . Marital status: Married    Spouse name: Not on file  . Number of children: Not on file  . Years of education: Not on file  . Highest education level: Not on file    Social Needs  . Financial resource strain: Not on file  . Food insecurity - worry: Not on file  . Food insecurity - inability: Not on file  . Transportation needs - medical: Not on file  . Transportation needs - non-medical: Not on file  Occupational History  . Not on file  Tobacco Use  . Smoking status: Former Smoker    Packs/day: 0.50    Years: 20.00    Pack years: 10.00    Types: Cigarettes    Last attempt to quit: 05/27/2008    Years since quitting: 8.9  . Smokeless tobacco: Never Used  Substance and Sexual Activity  . Alcohol use: Yes    Alcohol/week: 0.0 oz    Comment: occ  . Drug use: No  . Sexual activity: Not on file  Other Topics Concern  . Not on file  Social History Narrative  . Not on file    Past Surgical History:  Procedure Laterality Date  . ABDOMINAL HYSTERECTOMY    . APPENDECTOMY    . CARDIAC CATHETERIZATION  04/2000  . CARDIAC CATHETERIZATION    . COLONOSCOPY  12/31/09  . UPPER GASTROINTESTINAL ENDOSCOPY  09/15/1999    Family History  Problem Relation Age of Onset  . Arthritis Unknown   . Cancer Unknown        lung  . Coronary artery disease Unknown   . Stroke Unknown   . Colon cancer Neg Hx     Allergies  Allergen Reactions  .  Doxycycline     Gi upset  . Penicillins     REACTION: rash, aggression Has patient had a PCN reaction causing immediate rash, facial/tongue/throat swelling, SOB or lightheadedness with hypotension: YES Has patient had a PCN reaction causing severe rash involving mucus membranes or skin necrosis: NO Has patient had a PCN reaction that required hospitalizationNO Has patient had a PCN reaction occurring within the last 10 years:NO If all of the above answers are "NO", then may proceed with Cephalosporin use.   . Prednisone     REACTION: agressive behavior    Current Outpatient Medications on File Prior to Visit  Medication Sig Dispense Refill  . ALPRAZolam (XANAX) 1 MG tablet Take 1 mg by mouth 3 (three) times  daily as needed. For anxiety.    [name] buPROPion (WELLBUTRIN SR) 150 MG 12 hr tablet Take 150 mg by mouth 2 (two) times daily.  6  . citalopram (CELEXA) 40 MG tablet Take 40 mg by mouth daily.   6  . colchicine 0.6 MG tablet Take 1 tablet (0.6 mg total) by mouth 2 (two) times daily as needed. For gout flareups. 60 tablet 3  . furosemide (LASIX) 20 MG tablet TAKE 1 TABLET BY MOUTH DAILY AS NEEDED 30 tablet 1  . loratadine (CLARITIN) 10 MG tablet Take 10 mg by mouth daily.    . traZODone (DESYREL) 100 MG tablet Take 300 mg by mouth at bedtime.      No current facility-administered medications on file prior to visit.     BP 110/84 (BP Location: Left Arm)   Temp 98.1 F (36.7 C) (Oral)   Wt 248 lb (112.5 kg)   BMI 39.73 kg/m       Objective:   Physical Exam  Constitutional: She is oriented to person, place, and time. She appears well-developed and well-nourished. No distress.  HENT:  Head: Normocephalic and atraumatic.  Right Ear: Hearing, external ear and ear canal normal. Tympanic membrane is bulging. Tympanic membrane is not erythematous.  Left Ear: External ear and ear canal normal. Tympanic membrane is bulging. Tympanic membrane is not erythematous.  Nose: Mucosal edema and rhinorrhea present. Right sinus exhibits maxillary sinus tenderness and frontal sinus tenderness. Left sinus exhibits maxillary sinus tenderness and frontal sinus tenderness.  Mouth/Throat: Uvula is midline, oropharynx is clear and moist and mucous membranes are normal. No oropharyngeal exudate.  Cardiovascular: Normal rate, regular rhythm, normal heart sounds and intact distal pulses. Exam reveals no gallop and no friction rub.  No murmur heard. Pulmonary/Chest: Effort normal and breath sounds normal. No respiratory distress. She has no wheezes. She has no rales. She exhibits no tenderness.  Neurological: She is alert and oriented to person, place, and time.  Skin: Skin is warm and dry. Rash noted. Rash is  maculopapular. She is not diaphoretic. There is erythema. No pallor.  Bilateral legs    Psychiatric: She has a normal mood and affect. Her behavior is normal. Judgment and thought content normal.  Nursing note and vitals reviewed.     Assessment & Plan:  1. Acute recurrent pansinusitis - Probable sinusitis. Will treat with z pack  - azithromycin (ZITHROMAX Z-PAK) 250 MG tablet; Take 2 tablets on Day 1.  Then take 1 tablet daily.  Dispense: 6 tablet; Refill: 0 - Stay hydated and rest 2. Irritant contact dermatitis due to other agents - Possible allergic reaction to pantyhose?  - triamcinolone ointment (KENALOG) 0.5 %; Apply 1 application topically 2 (two) times daily.  Dispense: 30 g;  Refill: 0   Dorothyann Peng, NP

## 2017-06-05 ENCOUNTER — Ambulatory Visit: Payer: PPO | Admitting: Family Medicine

## 2017-06-05 ENCOUNTER — Ambulatory Visit (INDEPENDENT_AMBULATORY_CARE_PROVIDER_SITE_OTHER)
Admission: RE | Admit: 2017-06-05 | Discharge: 2017-06-05 | Disposition: A | Discharge status: Home / Self Care | Payer: PPO | Source: Ambulatory Visit | Attending: Family Medicine | Admitting: Family Medicine

## 2017-06-05 ENCOUNTER — Encounter: Payer: Self-pay | Admitting: Family Medicine

## 2017-06-05 VITALS — BP 122/70 | HR 74 | Temp 98.3°F | Wt 249.8 lb

## 2017-06-05 DIAGNOSIS — S8002XA Contusion of left knee, initial encounter: Secondary | ICD-10-CM

## 2017-06-05 DIAGNOSIS — S8012XA Contusion of left lower leg, initial encounter: Secondary | ICD-10-CM | POA: Diagnosis not present

## 2017-06-05 NOTE — Progress Notes (Signed)
   Subjective:    Patient ID: Emily [name], female    DOB: March 09, 1951, 66 y.o.   MRN: [id number]  HPI Here for an injury to the left leg when she fell getting out of the shower on 05-28-17. Her leg slipped up under the shower door and it struck the shin. Since then the lower leg has been swollen and bruised. She feels pins and needles in it but no real pain. She has been applying ice.   Review of Systems  Constitutional: Negative.   Respiratory: Negative.   Cardiovascular: Negative.   Skin: Positive for wound.       Objective:   Physical Exam  Constitutional: She is oriented to person, place, and time. She appears well-developed and well-nourished.  Cardiovascular: Normal rate, regular rhythm, normal heart sounds and intact distal pulses.  Pulmonary/Chest: Effort normal and breath sounds normal. No respiratory distress. She has no wheezes. She has no rales.  Musculoskeletal:  The lower left anterior leg has some swelling and a fair amount of ecchymosis. She is tender over the tibia. No crepitus. No calf tenderness.   Neurological: She is alert and oriented to person, place, and time.          Assessment & Plan:  Contusion to the shin. We will send her for an Xray today to rule out a cortical fracture. She should stay off the leg and keep it elevated. Use ice and Advil prn.  Alysia Penna, MD

## 2017-06-12 ENCOUNTER — Emergency Department (HOSPITAL_BASED_OUTPATIENT_CLINIC_OR_DEPARTMENT_OTHER)
Admission: EM | Admit: 2017-06-12 | Discharge: 2017-06-13 | Disposition: A | Discharge status: Home / Self Care | Payer: PPO | Attending: Emergency Medicine | Admitting: Emergency Medicine

## 2017-06-12 ENCOUNTER — Encounter (HOSPITAL_BASED_OUTPATIENT_CLINIC_OR_DEPARTMENT_OTHER): Payer: Self-pay | Admitting: Adult Health

## 2017-06-12 ENCOUNTER — Other Ambulatory Visit: Payer: Self-pay

## 2017-06-12 DIAGNOSIS — M79605 Pain in left leg: Secondary | ICD-10-CM | POA: Diagnosis present

## 2017-06-12 DIAGNOSIS — Z87891 Personal history of nicotine dependence: Secondary | ICD-10-CM | POA: Diagnosis not present

## 2017-06-12 DIAGNOSIS — L03116 Cellulitis of left lower limb: Secondary | ICD-10-CM | POA: Diagnosis not present

## 2017-06-12 DIAGNOSIS — Z79899 Other long term (current) drug therapy: Secondary | ICD-10-CM | POA: Insufficient documentation

## 2017-06-12 NOTE — ED Triage Notes (Signed)
Presents right lower leg pain that began after fallin gon the 9th of December. She was seen and treated at that time. The pain, swelling and redness have worsened over the last two weeks. She saw PRimary care doctor one week ago and was told it was just a hematoma and there is nothing to do. OVer the ;ast few days the redness has increased and there is warmth, increased pain and swelling to the ankle now. Denies fevers. PAin is worse with standing and movement.

## 2017-06-13 MED ORDER — SULFAMETHOXAZOLE-TRIMETHOPRIM 800-160 MG PO TABS: 1.0000 | ORAL_TABLET | Freq: Two times a day (BID) | ORAL | 0 refills | Status: AC

## 2017-06-13 MED ORDER — CEPHALEXIN 500 MG PO CAPS: 500.0000 mg | ORAL_CAPSULE | Freq: Four times a day (QID) | ORAL | 0 refills | Status: DC

## 2017-06-13 MED ORDER — CEPHALEXIN 250 MG PO CAPS
500.0000 mg | ORAL_CAPSULE | Freq: Once | ORAL | Status: AC
Start: 2017-06-13 — End: 2017-06-13
  Administered 2017-06-13: 500 mg via ORAL
  Filled 2017-06-13: qty 2

## 2017-06-13 MED ORDER — SULFAMETHOXAZOLE-TRIMETHOPRIM 800-160 MG PO TABS
1.0000 | ORAL_TABLET | Freq: Once | ORAL | Status: AC
  Administered 2017-06-13: 1 via ORAL
  Filled 2017-06-13: qty 1

## 2017-06-13 NOTE — ED Provider Notes (Signed)
[facility] EMERGENCY DEPARTMENT Provider Note   CSN: [id number] Arrival date & time: 06/12/17  2320     History   Chief Complaint Chief Complaint  Patient presents with  . Leg Pain    HPI Emily [name] is a 66 y.o. female.  HPI  Patient comes in with chief complaint of leg pain. Patient reports that about 2 weeks ago she fell in her shower, and her left lower extremity went under the shower door.  In the process patient ended up having scraping to her left lower extremity.  Patient developed an erythematous lesion, which was stable initially, however has increased in size over the past few days.  Patient also has had increased swelling in the left lower extremity, and her pain is worse when she is ambulating.   Patient denies any nausea, vomiting, fevers, chills.  Patient does not have any immunosuppressive condition. Pt has no hx of PE, DVT and denies any exogenous hormone (testosterone / estrogen) use, long distance travels or surgery in the past 6 weeks, active cancer, recent immobilization.   Past Medical History:  Diagnosis Date  . Allergy    seasonal   . Anxiety   . Cancer ([place]) 2009   squamous cell skin  . Cataract    bilateral surgery, left unsuccessful  . Depression   . Eating disorder   . GERD (gastroesophageal reflux disease)   . Glaucoma   . Hiatal hernia   . History of cardiovascular stress test 04/2000   cardiolyte  . History of flexible sigmoidoscopy 09/21/1999    Patient Active Problem List   Diagnosis Date Noted  . Hepatic steatosis 10/23/2016  . Ankle sprain 05/18/2011  . Chest wall soft tissue injury 05/18/2011  . Sprain of foot, left 05/18/2011  . VIRAL URI 12/01/2009  . ARTHRALGIA 11/10/2009  . WEIGHT GAIN 11/10/2009  . DYSPNEA 11/10/2009  . INGROWN NAIL 05/19/2009  . DEPRESSION 09/07/2008  . FOOT PAIN, BILATERAL 09/07/2008    Past Surgical History:  Procedure Laterality Date  . ABDOMINAL HYSTERECTOMY    . APPENDECTOMY     . CARDIAC CATHETERIZATION  04/2000  . CARDIAC CATHETERIZATION    . COLONOSCOPY  12/31/09  . UPPER GASTROINTESTINAL ENDOSCOPY  09/15/1999    OB History    No data available       Home Medications    Prior to Admission medications   Medication Sig Start Date End Date Taking? Authorizing Provider  ALPRAZolam Duanne Moron) 1 MG tablet Take 1 mg by mouth 3 (three) times daily as needed. For anxiety.   Yes [provider]  buPROPion (WELLBUTRIN SR) 150 MG 12 hr tablet Take 150 mg by mouth 2 (two) times daily. 07/07/14  Yes [provider]  citalopram (CELEXA) 40 MG tablet Take 40 mg by mouth daily.  07/07/14  Yes [provider]  colchicine 0.6 MG tablet Take 1 tablet (0.6 mg total) by mouth 2 (two) times daily as needed. For gout flareups. 01/21/16  Yes Burchette, Alinda Sierras, MD  loratadine (CLARITIN) 10 MG tablet Take 10 mg by mouth daily.   Yes [provider]  traZODone (DESYREL) 100 MG tablet Take 300 mg by mouth at bedtime.    Yes [provider]  cephALEXin (KEFLEX) 500 MG capsule Take 1 capsule (500 mg total) by mouth 4 (four) times daily. 06/13/17   Varney Biles, MD  furosemide (LASIX) 20 MG tablet TAKE 1 TABLET BY MOUTH DAILY AS NEEDED 01/01/13   Burchette, Alinda Sierras,  MD  sulfamethoxazole-trimethoprim (BACTRIM DS,SEPTRA DS) 800-160 MG tablet Take 1 tablet by mouth 2 (two) times daily for 7 days. 06/13/17 06/20/17  Varney Biles, MD  triamcinolone ointment (KENALOG) 0.5 % Apply 1 application topically 2 (two) times daily. 05/22/17   Dorothyann Peng, NP    Family History Family History  Problem Relation Age of Onset  . Arthritis Unknown   . Cancer Unknown        lung  . Coronary artery disease Unknown   . Stroke Unknown   . Colon cancer Neg Hx     Social History Social History   Tobacco Use  . Smoking status: Former Smoker    Packs/day: 0.50    Years: 20.00    Pack years: 10.00    Types: Cigarettes    Last attempt to quit: 05/27/2008     Years since quitting: 9.0  . Smokeless tobacco: Never Used  Substance Use Topics  . Alcohol use: Yes    Alcohol/week: 0.0 oz    Comment: occ  . Drug use: No     Allergies   Doxycycline; Penicillins; and Prednisone   Review of Systems Review of Systems  Constitutional: Negative for fever.  Cardiovascular: Positive for leg swelling.  Musculoskeletal: Positive for myalgias.  Allergic/Immunologic: Negative for immunocompromised state.  Hematological: Does not bruise/bleed easily.     Physical Exam Updated Vital Signs BP (!) 154/80 (BP Location: Right Arm)   Pulse 77   Temp 97.7 F (36.5 C) (Oral)   Resp 14   Ht 5' 6.5" (1.689 m)   Wt 113.3 kg (249 lb 12 oz)   SpO2 97%   BMI 39.71 kg/m   Physical Exam  Constitutional: She is oriented to person, place, and time. She appears well-developed.  HENT:  Head: Normocephalic and atraumatic.  Eyes: EOM are normal.  Neck: Normal range of motion. Neck supple.  Cardiovascular: Normal rate.  Pulmonary/Chest: Effort normal.  Abdominal: Bowel sounds are normal.  Musculoskeletal:  Left lower extremity unilateral swelling with tenderness to palpation. Over the distal anterior tibia, patient has erythematous lesion which is warm to touch and tender. No crepitus.  Neurological: She is alert and oriented to person, place, and time.  Skin: Skin is warm and dry.  Nursing note and vitals reviewed.    ED Treatments / Results  Labs (all labs ordered are listed, but only abnormal results are displayed) Labs Reviewed - No data to display  EKG  EKG Interpretation None       Radiology No results found.  Procedures Procedures (including critical care time)  Medications Ordered in ED Medications  cephALEXin (KEFLEX) capsule 500 mg (500 mg Oral Given 06/13/17 0103)  sulfamethoxazole-trimethoprim (BACTRIM DS,SEPTRA DS) 800-160 MG per tablet 1 tablet (1 tablet Oral Given 06/13/17 0103)     Initial Impression / Assessment and  Plan / ED Course  I have reviewed the triage vital signs and the nursing notes.  Pertinent labs & imaging results that were available during my care of the patient were reviewed by me and considered in my medical decision making (see chart for details).     Patient comes in with chief complaint of leg pain. Patient had an injury about 2 weeks ago, which led to scraping of the left lower extremity.  Initially patient just had a small area of erythema over the injurious site, which has gotten larger in size. Pt also has had increased pain and swelling.  Differential diagnosis includes cellulitis versus DVT.  Patient is  immunocompetent, had an x-ray a few days ago which did not show any air/gas.  Patient appears nontoxic, there is no crepitus - so we will not get repeat xray as our suspicion for necrotizing infection is low.  Plan is to demarcate the erythematous region.  Patient to be started on Keflex and Bactrim.  Patient will either see her primary care doctor or return to the ER in 3 days per recheck.  If symptoms are getting worse, she might need admission for IV antibiotics versus looking for DVT.  Strict ER return precautions have been discussed, and patient is agreeing with the plan and is comfortable with the workup done and the recommendations from the ER.   Final Clinical Impressions(s) / ED Diagnoses   Final diagnoses:  Cellulitis of left lower extremity    ED Discharge Orders        Ordered    cephALEXin (KEFLEX) 500 MG capsule  4 times daily     06/13/17 0052    sulfamethoxazole-trimethoprim (BACTRIM DS,SEPTRA DS) 800-160 MG tablet  2 times daily     06/13/17 0052       Varney Biles, MD 06/13/17 0104

## 2017-06-13 NOTE — ED Notes (Signed)
Pt. Reports she has had an x ray at her PCP office of the lower L leg and no broken bones reported.

## 2017-06-13 NOTE — ED Notes (Signed)
Area of redness on leg marked with surgical marking pen and pt instructed to returned if redness spreads outside of area.

## 2017-06-13 NOTE — Discharge Instructions (Signed)
Please start the antibiotics and return to the ER or see your doctor in 3 days.  Suspicion for blood clot is lower at this time, however, if the symptoms are not improving please have the next provider consider DVT in their diagnosis as well.  Return to the ER immediately if the rash starts spreading quickly.

## 2017-06-15 ENCOUNTER — Telehealth: Payer: Self-pay | Admitting: *Deleted

## 2017-06-15 ENCOUNTER — Ambulatory Visit: Payer: Self-pay | Admitting: Family Medicine

## 2017-06-15 ENCOUNTER — Other Ambulatory Visit: Payer: Self-pay | Admitting: Family Medicine

## 2017-06-15 ENCOUNTER — Ambulatory Visit (INDEPENDENT_AMBULATORY_CARE_PROVIDER_SITE_OTHER): Payer: PPO | Admitting: Family Medicine

## 2017-06-15 ENCOUNTER — Encounter: Payer: Self-pay | Admitting: Family Medicine

## 2017-06-15 VITALS — BP 112/70 | HR 62 | Temp 97.6°F | Ht 66.5 in | Wt 251.4 lb

## 2017-06-15 DIAGNOSIS — L03116 Cellulitis of left lower limb: Secondary | ICD-10-CM | POA: Diagnosis not present

## 2017-06-15 DIAGNOSIS — Z1231 Encounter for screening mammogram for malignant neoplasm of breast: Secondary | ICD-10-CM

## 2017-06-15 NOTE — Telephone Encounter (Signed)
Patient left a [facility] bag in the room with her medications. I left a detailed message at her cell number this was left at the front desk for her to pick up.

## 2017-06-15 NOTE — Progress Notes (Signed)
Subjective:     Patient ID: Emily [name], female   DOB: 05/29/1951, 66 y.o.   MRN: [id number]  HPI   Patient seen for follow-up cellulitis left leg. She had a fall around December 10th. She was seen here the next Tuesday and diagnosed with contusion. X-ray of the leg revealed no cortical irregularities. She then a week later developed some increased erythema and swelling. She went to emergency room. Was placed on Keflex and Bactrim and symptoms started improving slightly. She's not had any fevers or chills. Still has some soreness especially with dependency. She is taking Advil for her pain.  She's been applying ice thinking that would help  Past Medical History:  Diagnosis Date  . Allergy    seasonal   . Anxiety   . Cancer ([place]) 2009   squamous cell skin  . Cataract    bilateral surgery, left unsuccessful  . Depression   . Eating disorder   . GERD (gastroesophageal reflux disease)   . Glaucoma   . Hiatal hernia   . History of cardiovascular stress test 04/2000   cardiolyte  . History of flexible sigmoidoscopy 09/21/1999   Past Surgical History:  Procedure Laterality Date  . ABDOMINAL HYSTERECTOMY    . APPENDECTOMY    . CARDIAC CATHETERIZATION  04/2000  . CARDIAC CATHETERIZATION    . COLONOSCOPY  12/31/09  . UPPER GASTROINTESTINAL ENDOSCOPY  09/15/1999    reports that she quit smoking about 9 years ago. Her smoking use included cigarettes. She has a 10.00 pack-year smoking history. she has never used smokeless tobacco. She reports that she drinks alcohol. She reports that she does not use drugs. family history includes Arthritis in her unknown relative; Cancer in her unknown relative; Coronary artery disease in her unknown relative; Stroke in her unknown relative. Allergies  Allergen Reactions  . Doxycycline     Gi upset  . Penicillins     REACTION: rash, aggression Has patient had a PCN reaction causing immediate rash, facial/tongue/throat swelling, SOB or lightheadedness  with hypotension: YES Has patient had a PCN reaction causing severe rash involving mucus membranes or skin necrosis: NO Has patient had a PCN reaction that required hospitalizationNO Has patient had a PCN reaction occurring within the last 10 years:NO If all of the above answers are "NO", then may proceed with Cephalosporin use.   . Prednisone     REACTION: agressive behavior     Review of Systems  Constitutional: Negative for chills and fever.       Objective:   Physical Exam  Constitutional: She appears well-developed and well-nourished.  Cardiovascular: Normal rate and regular rhythm.  Pulmonary/Chest: Effort normal and breath sounds normal. No respiratory distress. She has no wheezes. She has no rales.  Skin:  Left leg reveals area of erythema 11 x 13 cm. This is actually reduced from the line that was drawn around this in the ER a few days ago. She has mild warmth. Mild tenderness. No skin breakdown.       Assessment:     Cellulitis left leg slowly improving    Plan:     -Avoid ice -Elevate legs frequently -Apply heat 3-4 times daily -Finish out Bactrim and Keflex and follow-up promptly for any increasing erythema or other concerns.  Eulas Post MD [facility]

## 2017-06-15 NOTE — Patient Instructions (Signed)

## 2017-07-03 ENCOUNTER — Ambulatory Visit: Payer: Self-pay | Admitting: *Deleted

## 2017-07-03 DIAGNOSIS — H40013 Open angle with borderline findings, low risk, bilateral: Secondary | ICD-10-CM | POA: Diagnosis not present

## 2017-07-03 NOTE — Telephone Encounter (Signed)
Patient states she was treated for cellulitis and she was getting better with the antibiotic. She had finished the antibiotics a week ago and the redness had gotten better. It appears to be spreading on leg- on one side. She is having pain again and swelling.   Reason for Disposition . [1] Finished taking antibiotic AND [2] cellulitis symptoms are WORSE (e.g., redness, pain  drainage, swelling)  Answer Assessment - Initial Assessment Questions 1. SYMPTOM: "What's the main symptom you're concerned about?" (e.g., redness, swelling, pain, fever, weakness)     Redness, swelling and pain 2. CELLULITIS LOCATION: "Where is the cellulitis  located?" (e.g., hand, arm, foot, leg, face)     Left leg 3. CELLULITIS  SIZE: "What is the size of the red area?" (e.g., inches, centimeters; compare to size of a coin) .     6" long 5" wide 4. BETTER-SAME-WORSE: "Are you getting better, staying the same, or getting worse compared to the day you started the antibiotics?"  Better than when started- but since stopped ATB she feel she is having symptoms agian 5.  PAIN: Do you have any pain?"  If so, "How bad is the pain?"  (e.g., Scale 1-10; mild, moderate, or severe)    - MILD (1-3): doesn't interfere with normal activities     - MODERATE (4-7): interferes with normal activities or awakens from sleep    - SEVERE (8-10): excruciating pain, unable to do any normal activities       7- using Ibuprofen 6.  FEVER: "Do you have a fever?" If so, ask: "What is it, how was it measured and when did it start?"     No fever 7. OTHER SYMPTOMS: "Do you have any other symptoms?" (e.g., pus coming from a wound, red streaks, weakness) 8.  DIAGNOSIS DATE: "When was the cellulitis diagnosed?" "By whom?" 12/25- [facility] 9.  ANTIBIOTIC NAME: "What antibiotic(s) are you taking?"  "How many times per day?" (Be sure the patient is receiving the antibiotic as directed).      Patient has finished 10. ANTIBIOTIC DATE: "When was the  antibiotic started?"       Finished- last Monday 11. FOLLOW-UP APPOINTMENT: "Do you have follow-up appointment with your doctor?"       no  Protocols used: CELLULITIS ON ANTIBIOTIC FOLLOW-UP CALL-A-AH

## 2017-07-04 ENCOUNTER — Ambulatory Visit: Payer: Self-pay | Admitting: Family Medicine

## 2017-07-11 ENCOUNTER — Ambulatory Visit: Payer: Self-pay

## 2017-07-23 ENCOUNTER — Ambulatory Visit: Payer: Self-pay

## 2017-07-26 ENCOUNTER — Ambulatory Visit
Admission: RE | Admit: 2017-07-26 | Discharge: 2017-07-26 | Disposition: A | Discharge status: Home / Self Care | Payer: PPO | Source: Ambulatory Visit | Attending: Family Medicine | Admitting: Family Medicine

## 2017-07-26 DIAGNOSIS — Z1231 Encounter for screening mammogram for malignant neoplasm of breast: Secondary | ICD-10-CM | POA: Diagnosis not present

## 2017-10-18 DIAGNOSIS — H40053 Ocular hypertension, bilateral: Secondary | ICD-10-CM | POA: Diagnosis not present

## 2017-10-18 DIAGNOSIS — H5213 Myopia, bilateral: Secondary | ICD-10-CM | POA: Diagnosis not present

## 2017-11-29 DIAGNOSIS — H40053 Ocular hypertension, bilateral: Secondary | ICD-10-CM | POA: Diagnosis not present

## 2018-02-14 ENCOUNTER — Ambulatory Visit (INDEPENDENT_AMBULATORY_CARE_PROVIDER_SITE_OTHER): Payer: PPO | Admitting: Family Medicine

## 2018-02-14 ENCOUNTER — Encounter: Payer: Self-pay | Admitting: Family Medicine

## 2018-02-14 VITALS — BP 120/80 | HR 117 | Temp 97.7°F | Wt 232.7 lb

## 2018-02-14 DIAGNOSIS — L810 Postinflammatory hyperpigmentation: Secondary | ICD-10-CM | POA: Diagnosis not present

## 2018-02-14 NOTE — Patient Instructions (Signed)
Leave off steroid cream at this time

## 2018-02-14 NOTE — Progress Notes (Signed)
  Subjective:     Patient ID: Emily [name], female   DOB: [birth year], 67 y.o.   MRN: [id number]  HPI Patient seen with some mild discoloration left anterior leg since last year. Back in December 2018 she had a contusion of the leg. She developed some subsequent cellulitis changes and was treated with Keflex. X-rays revealed no fracture. She does not recall hematoma.  She's had some very mild discoloration which has been stable since December. Blanches with pressure. No significant warmth. No fevers or chills. No progressive erythema. She has a little bit of thickening of the subcutaneous tissue where she had the contusion. No pain with ambulation.  Occasional itching and she is use steroid creams for that intermittently  Past Medical History:  Diagnosis Date  . Allergy    seasonal   . Anxiety   . Cancer ([place]) 2009   squamous cell skin  . Cataract    bilateral surgery, left unsuccessful  . Depression   . Eating disorder   . GERD (gastroesophageal reflux disease)   . Glaucoma   . Hiatal hernia   . History of cardiovascular stress test 04/2000   cardiolyte  . History of flexible sigmoidoscopy 09/21/1999   Past Surgical History:  Procedure Laterality Date  . ABDOMINAL HYSTERECTOMY    . APPENDECTOMY    . CARDIAC CATHETERIZATION  04/2000  . CARDIAC CATHETERIZATION    . COLONOSCOPY  12/31/09  . UPPER GASTROINTESTINAL ENDOSCOPY  09/15/1999    reports that she quit smoking about 9 years ago. Her smoking use included cigarettes. She has a 10.00 pack-year smoking history. She has never used smokeless tobacco. She reports that she drinks alcohol. She reports that she does not use drugs. family history includes Arthritis in her unknown relative; Cancer in her unknown relative; Coronary artery disease in her unknown relative; Stroke in her unknown relative. Allergies  Allergen Reactions  . Doxycycline     Gi upset  . Penicillins     REACTION: rash, aggression Has patient had a PCN reaction  causing immediate rash, facial/tongue/throat swelling, SOB or lightheadedness with hypotension: YES Has patient had a PCN reaction causing severe rash involving mucus membranes or skin necrosis: NO Has patient had a PCN reaction that required hospitalizationNO Has patient had a PCN reaction occurring within the last 10 years:NO If all of the above answers are "NO", then may proceed with Cephalosporin use.   . Prednisone     REACTION: agressive behavior     Review of Systems  Constitutional: Negative for chills and fever.       Objective:   Physical Exam  Constitutional: She appears well-developed and well-nourished.  Cardiovascular: Normal rate and regular rhythm.  Musculoskeletal:  No calf tenderness. No pitting edema.  Skin:  Left leg reveals approximately 9 x 6 cm area of slight hyperpigmentation anterior leg which blanches with pressure. No ulcers. No pustules. Non-tender.       Assessment:     Mild hyperpigmentation changes following injury several months ago. Suspect postinflammatory hyperpigmentation.No concerning findings. No evidence for active cellulitis    Plan:     -avoid chronic daily steroids and we explained these can cause atrophy, hyperpigmentation, or hypopigmentation -we recommended that she consider setting up complete physical   Eulas Post MD [facility] at [facility]

## 2018-04-18 ENCOUNTER — Encounter: Payer: Self-pay | Admitting: Family Medicine

## 2018-04-18 ENCOUNTER — Ambulatory Visit (INDEPENDENT_AMBULATORY_CARE_PROVIDER_SITE_OTHER): Payer: PPO

## 2018-04-18 ENCOUNTER — Ambulatory Visit (INDEPENDENT_AMBULATORY_CARE_PROVIDER_SITE_OTHER): Payer: PPO | Admitting: Family Medicine

## 2018-04-18 VITALS — BP 112/78 | HR 78 | Temp 98.7°F | Ht 66.5 in | Wt 233.0 lb

## 2018-04-18 DIAGNOSIS — R05 Cough: Secondary | ICD-10-CM

## 2018-04-18 DIAGNOSIS — J111 Influenza due to unidentified influenza virus with other respiratory manifestations: Secondary | ICD-10-CM

## 2018-04-18 DIAGNOSIS — R69 Illness, unspecified: Secondary | ICD-10-CM

## 2018-04-18 DIAGNOSIS — R059 Cough, unspecified: Secondary | ICD-10-CM

## 2018-04-18 LAB — POC INFLUENZA A&B (BINAX/QUICKVUE)
INFLUENZA A, POC: NEGATIVE
INFLUENZA B, POC: NEGATIVE

## 2018-04-18 MED ORDER — BENZONATATE 100 MG PO CAPS: 100.0000 mg | ORAL_CAPSULE | Freq: Three times a day (TID) | ORAL | 0 refills | Status: DC | PRN

## 2018-04-18 NOTE — Progress Notes (Signed)
HPI:  Using dictation device. Unfortunately this device frequently misinterprets words/phrases.   Acute visit for respiratory illness: -started: -about 3 days ago -symptoms:nasal congestion, sore throat, cough, body aches, mild SOB, diarrhea a few times -denies:fever, SOB, NVD, tooth pain -has tried: nothing -sick contacts/travel/risks: no reported flu, strep or tick exposure -Hx of: anxiety, seasonal allergies, GERD, daughter dx with pseudo flu recently with similar symptoms  ROS: See pertinent positives and negatives per HPI.  Past Medical History:  Diagnosis Date  . Allergy    seasonal   . Anxiety   . Cancer ([place]) 2009   squamous cell skin  . Cataract    bilateral surgery, left unsuccessful  . Depression   . Eating disorder   . GERD (gastroesophageal reflux disease)   . Glaucoma   . Hiatal hernia   . History of cardiovascular stress test 04/2000   cardiolyte  . History of flexible sigmoidoscopy 09/21/1999    Past Surgical History:  Procedure Laterality Date  . ABDOMINAL HYSTERECTOMY    . APPENDECTOMY    . CARDIAC CATHETERIZATION  04/2000  . CARDIAC CATHETERIZATION    . COLONOSCOPY  12/31/09  . UPPER GASTROINTESTINAL ENDOSCOPY  09/15/1999    Family History  Problem Relation Age of Onset  . Arthritis Unknown   . Cancer Unknown        lung  . Coronary artery disease Unknown   . Stroke Unknown   . Colon cancer Neg Hx     Social History   Socioeconomic History  . Marital status: Married    Spouse name: Not on file  . Number of children: Not on file  . Years of education: Not on file  . Highest education level: Not on file  Occupational History  . Not on file  Social Needs  . Financial resource strain: Not on file  . Food insecurity:    Worry: Not on file    Inability: Not on file  . Transportation needs:    Medical: Not on file    Non-medical: Not on file  Tobacco Use  . Smoking status: Former Smoker    Packs/day: 0.50    Years: 20.00    Pack  years: 10.00    Types: Cigarettes    Last attempt to quit: 05/27/2008    Years since quitting: 9.8  . Smokeless tobacco: Never Used  Substance and Sexual Activity  . Alcohol use: Yes    Alcohol/week: 0.0 standard drinks    Comment: occ  . Drug use: No  . Sexual activity: Not on file  Lifestyle  . Physical activity:    Days per week: Not on file    Minutes per session: Not on file  . Stress: Not on file  Relationships  . Social connections:    Talks on phone: Not on file    Gets together: Not on file    Attends religious service: Not on file    Active member of club or organization: Not on file    Attends meetings of clubs or organizations: Not on file    Relationship status: Not on file  Other Topics Concern  . Not on file  Social History Narrative  . Not on file     Current Outpatient Medications:  .  ALPRAZolam (XANAX) 1 MG tablet, Take 1 mg by mouth 3 (three) times daily as needed. For anxiety., Disp: , Rfl:  .  colchicine 0.6 MG tablet, Take 1 tablet (0.6 mg total) by mouth 2 (two) times  daily as needed. For gout flareups., Disp: 60 tablet, Rfl: 3 .  D3-50 50000 units capsule, Take 50,000 Units by mouth once a week., Disp: , Rfl: 12 .  desvenlafaxine (PRISTIQ) 100 MG 24 hr tablet, Take 100 mg by mouth every morning., Disp: , Rfl: 12 .  furosemide (LASIX) 20 MG tablet, TAKE 1 TABLET BY MOUTH DAILY AS NEEDED, Disp: 30 tablet, Rfl: 1 .  loratadine (CLARITIN) 10 MG tablet, Take 10 mg by mouth daily., Disp: , Rfl:  .  traZODone (DESYREL) 100 MG tablet, Take 300 mg by mouth at bedtime. , Disp: , Rfl:  .  triamcinolone ointment (KENALOG) 0.5 %, Apply 1 application topically 2 (two) times daily., Disp: 30 g, Rfl: 0 .  benzonatate (TESSALON PERLES) 100 MG capsule, Take 1 capsule (100 mg total) by mouth 3 (three) times daily as needed., Disp: 20 capsule, Rfl: 0  EXAM:  Vitals:   04/18/18 1459  BP: 112/78  Pulse: 78  Temp: 98.7 F (37.1 C)  SpO2: 98%    Body mass index is  37.04 kg/m.  GENERAL: vitals reviewed and listed above, alert, oriented, appears well hydrated and in no acute distress  HEENT: atraumatic, conjunttiva clear, no obvious abnormalities on inspection of external nose and ears, normal appearance of ear canals and TMs, clear nasal congestion, mild post oropharyngeal erythema with PND, no tonsillar edema or exudate, no sinus TTP  NECK: no obvious masses on inspection  LUNGS: clear to auscultation bilaterally, no wheezes, rales or rhonchi, good air movement  CV: HRRR, no peripheral edema  MS: moves all extremities without noticeable abnormality  PSYCH: pleasant and cooperative, no obvious depression or anxiety  ASSESSMENT AND PLAN:  Discussed the following assessment and plan:  Cough - Plan: DG Chest 2 View  Influenza-like illness - Plan: POC Influenza A&B(BINAX/QUICKVUE)  -given HPI and exam findings today, a serious infection or illness is unlikely. We discussed potential etiologies, with VURI or influenza being most likely. Discussed risks/benefits tamiflu and she declined. Influenza testing neg. CXR to exclude LRI. Tessalon for cough. We discussed treatment side effects, likely course, antibiotic misuse, transmission, and signs of developing a serious illness. -of course, we advised to return or notify a doctor immediately if symptoms worsen or persist or new concerns arise.    Patient Instructions  BEFORE YOU LEAVE: -flu test -CXR -follow up: with PCP for regular follow up in the next 1-3 months  I sent the cough medication to the pharmacy - tessalon.  The radiologist will read the xray and send [place] a report. We will call you if it is abnormal. Clinically stable results will be released to your [facility]. If you have not heard from [place] or cannot find your results in [facility] please contact our office at [phone].  If you are not yet signed up for [facility], please consider signing up.  I hope you are feeling better soon! Seek  care promptly if your symptoms worsen, new concerns arise or you are not improving with treatment.    Influenza, Adult Influenza ("the flu") is an infection in the lungs, nose, and throat (respiratory tract). It is caused by a virus. The flu causes many common cold symptoms, as well as a high fever and body aches. It can make you feel very sick. The flu spreads easily from person to person (is contagious). Getting a flu shot (influenza vaccination) every year is the best way to prevent the flu. Follow these instructions at home:  Take over-the-counter and prescription  medicines only as told by your doctor.  Use a cool mist humidifier to add moisture (humidity) to the air in your home. This can make it easier to breathe.  Rest as needed.  Drink enough fluid to keep your pee (urine) clear or pale yellow.  Cover your mouth and nose when you cough or sneeze.  Wash your hands with soap and water often, especially after you cough or sneeze. If you cannot use soap and water, use hand sanitizer.  Stay home from work or school as told by your doctor. Unless you are visiting your doctor, try to avoid leaving home until your fever has been gone for 24 hours without the use of medicine.  Keep all follow-up visits as told by your doctor. This is important. How is this prevented?  Getting a yearly (annual) flu shot is the best way to avoid getting the flu. You may get the flu shot in late summer, fall, or winter. Ask your doctor when you should get your flu shot.  Wash your hands often or use hand sanitizer often.  Avoid contact with people who are sick during cold and flu season.  Eat healthy foods.  Drink plenty of fluids.  Get enough sleep.  Exercise regularly. Contact a doctor if:  You get new symptoms.  You have: ? Chest pain. ? Watery poop (diarrhea). ? A fever.  Your cough gets worse.  You start to have more mucus.  You feel sick to your stomach (nauseous).  You throw  up (vomit). Get help right away if:  You start to be short of breath or have trouble breathing.  Your skin or nails turn a bluish color.  You have very bad pain or stiffness in your neck.  You get a sudden headache.  You get sudden pain in your face or ear.  You cannot stop throwing up. This information is not intended to replace advice given to you by your health care provider. Make sure you discuss any questions you have with your health care provider. Document Released: 03/14/2008 Document Revised: 11/11/2015 Document Reviewed: 03/30/2015 Elsevier Interactive Patient Education  2017 [place], [place]

## 2018-04-18 NOTE — Patient Instructions (Addendum)
BEFORE YOU LEAVE: -flu test -CXR -follow up: with PCP for regular follow up in the next 1-3 months  I sent the cough medication to the pharmacy - tessalon.  The radiologist will read the xray and send [place] a report. We will call you if it is abnormal. Clinically stable results will be released to your [facility]. If you have not heard from [place] or cannot find your results in [facility] please contact our office at [phone].  If you are not yet signed up for [facility], please consider signing up.  I hope you are feeling better soon! Seek care promptly if your symptoms worsen, new concerns arise or you are not improving with treatment.    Influenza, Adult Influenza ("the flu") is an infection in the lungs, nose, and throat (respiratory tract). It is caused by a virus. The flu causes many common cold symptoms, as well as a high fever and body aches. It can make you feel very sick. The flu spreads easily from person to person (is contagious). Getting a flu shot (influenza vaccination) every year is the best way to prevent the flu. Follow these instructions at home:  Take over-the-counter and prescription medicines only as told by your doctor.  Use a cool mist humidifier to add moisture (humidity) to the air in your home. This can make it easier to breathe.  Rest as needed.  Drink enough fluid to keep your pee (urine) clear or pale yellow.  Cover your mouth and nose when you cough or sneeze.  Wash your hands with soap and water often, especially after you cough or sneeze. If you cannot use soap and water, use hand sanitizer.  Stay home from work or school as told by your doctor. Unless you are visiting your doctor, try to avoid leaving home until your fever has been gone for 24 hours without the use of medicine.  Keep all follow-up visits as told by your doctor. This is important. How is this prevented?  Getting a yearly (annual) flu shot is the best way to avoid getting the flu. You may get  the flu shot in late summer, fall, or winter. Ask your doctor when you should get your flu shot.  Wash your hands often or use hand sanitizer often.  Avoid contact with people who are sick during cold and flu season.  Eat healthy foods.  Drink plenty of fluids.  Get enough sleep.  Exercise regularly. Contact a doctor if:  You get new symptoms.  You have: ? Chest pain. ? Watery poop (diarrhea). ? A fever.  Your cough gets worse.  You start to have more mucus.  You feel sick to your stomach (nauseous).  You throw up (vomit). Get help right away if:  You start to be short of breath or have trouble breathing.  Your skin or nails turn a bluish color.  You have very bad pain or stiffness in your neck.  You get a sudden headache.  You get sudden pain in your face or ear.  You cannot stop throwing up. This information is not intended to replace advice given to you by your health care provider. Make sure you discuss any questions you have with your health care provider. Document Released: 03/14/2008 Document Revised: 11/11/2015 Document Reviewed: 03/30/2015 Elsevier Interactive Patient Education  2017 [employer].

## 2018-04-23 ENCOUNTER — Encounter: Payer: Self-pay | Admitting: Family Medicine

## 2018-04-23 ENCOUNTER — Ambulatory Visit (INDEPENDENT_AMBULATORY_CARE_PROVIDER_SITE_OTHER): Payer: PPO | Admitting: Family Medicine

## 2018-04-23 ENCOUNTER — Other Ambulatory Visit: Payer: Self-pay

## 2018-04-23 VITALS — BP 120/72 | HR 74 | Temp 98.0°F | Ht 66.5 in | Wt 238.1 lb

## 2018-04-23 DIAGNOSIS — R05 Cough: Secondary | ICD-10-CM | POA: Diagnosis not present

## 2018-04-23 DIAGNOSIS — R059 Cough, unspecified: Secondary | ICD-10-CM

## 2018-04-23 MED ORDER — FUROSEMIDE 20 MG PO TABS: 20.0000 mg | ORAL_TABLET | Freq: Every day | ORAL | 1 refills | Status: DC | PRN

## 2018-04-23 NOTE — Progress Notes (Signed)
Subjective:     Patient ID: Emily [name], female   DOB: 09-23-1950, 67 y.o.   MRN: [id number]  HPI Patient is seen with cough for over the past week.  She was seen here last week and had chest x-ray which was unremarkable.  She was prescribed Tessalon which does help her cough somewhat.  Cough is occasionally productive but mostly dry.  She has had increased fatigue.  Spent much of last week in bed but then worked over the weekend.  She worked again today.  She has not had any fever.  No dyspnea with basic activities such as walking.  She has gained about 5 pounds since last week.  She states she is not urinating as frequent as usual.  does appear to be drinking adequate volume of fluids. No burning with urination.  No peripheral edema.  No orthopnea.  No history of CHF.  Recent chest x-ray no pulmonary edema.  Past Medical History:  Diagnosis Date  . Allergy    seasonal   . Anxiety   . Cancer ([place]) 2009   squamous cell skin  . Cataract    bilateral surgery, left unsuccessful  . Depression   . Eating disorder   . GERD (gastroesophageal reflux disease)   . Glaucoma   . Hiatal hernia   . History of cardiovascular stress test 04/2000   cardiolyte  . History of flexible sigmoidoscopy 09/21/1999   Past Surgical History:  Procedure Laterality Date  . ABDOMINAL HYSTERECTOMY    . APPENDECTOMY    . CARDIAC CATHETERIZATION  04/2000  . CARDIAC CATHETERIZATION    . COLONOSCOPY  12/31/09  . UPPER GASTROINTESTINAL ENDOSCOPY  09/15/1999    reports that she quit smoking about 9 years ago. Her smoking use included cigarettes. She has a 10.00 pack-year smoking history. She has never used smokeless tobacco. She reports that she drinks alcohol. She reports that she does not use drugs. family history includes Arthritis in her unknown relative; Cancer in her unknown relative; Coronary artery disease in her unknown relative; Stroke in her unknown relative. Allergies  Allergen Reactions  . Doxycycline      Gi upset  . Penicillins     REACTION: rash, aggression Has patient had a PCN reaction causing immediate rash, facial/tongue/throat swelling, SOB or lightheadedness with hypotension: YES Has patient had a PCN reaction causing severe rash involving mucus membranes or skin necrosis: NO Has patient had a PCN reaction that required hospitalizationNO Has patient had a PCN reaction occurring within the last 10 years:NO If all of the above answers are "NO", then may proceed with Cephalosporin use.   . Prednisone     REACTION: agressive behavior     Review of Systems  Constitutional: Positive for fatigue and unexpected weight change. Negative for chills and fever.  HENT: Negative for sinus pressure.   Respiratory: Positive for cough. Negative for shortness of breath and wheezing.   Cardiovascular: Negative for chest pain.  Genitourinary: Negative for dysuria.       Objective:   Physical Exam  Constitutional: She appears well-developed and well-nourished.  HENT:  Mouth/Throat: Oropharynx is clear and moist.  Neck: Neck supple.  Cardiovascular: Normal rate and regular rhythm.  Pulmonary/Chest: Effort normal and breath sounds normal. She has no wheezes. She has no rales.  Musculoskeletal: She exhibits no edema.  Lymphadenopathy:    She has no cervical adenopathy.       Assessment:     Patient presents with approximately 10-day history of cough  with nonfocal exam.  Recent chest x-ray unremarkable.  Suspect this is still viral.  She has not had any red flags such as fever  Patient relates 5 pound weight gain since last week.  She does not have any visible peripheral edema and lung exam is clear.    Plan:     -Refilled Lasix which she takes very infrequently 20 mg daily for peripheral edema.  Watch sodium intake. -Follow-up immediately for any fever, increased shortness of breath or any further weight gain.  Eulas Post MD [facility] at [facility]

## 2018-04-23 NOTE — Patient Instructions (Signed)
Follow up for any fever or increased shortness of breath  Monitor weight and be in touch if still increasing over next few days.

## 2018-04-25 DIAGNOSIS — H40053 Ocular hypertension, bilateral: Secondary | ICD-10-CM | POA: Diagnosis not present

## 2018-05-16 ENCOUNTER — Other Ambulatory Visit: Payer: Self-pay | Admitting: Family Medicine

## 2018-06-11 ENCOUNTER — Other Ambulatory Visit: Payer: Self-pay | Admitting: Family Medicine

## 2018-07-17 ENCOUNTER — Emergency Department (HOSPITAL_BASED_OUTPATIENT_CLINIC_OR_DEPARTMENT_OTHER)
Admission: EM | Admit: 2018-07-17 | Discharge: 2018-07-17 | Disposition: A | Discharge status: Home / Self Care | Payer: Worker's Compensation | Attending: Emergency Medicine | Admitting: Emergency Medicine

## 2018-07-17 ENCOUNTER — Emergency Department (HOSPITAL_BASED_OUTPATIENT_CLINIC_OR_DEPARTMENT_OTHER): Payer: Worker's Compensation

## 2018-07-17 ENCOUNTER — Other Ambulatory Visit: Payer: Self-pay

## 2018-07-17 ENCOUNTER — Ambulatory Visit: Payer: Self-pay | Admitting: *Deleted

## 2018-07-17 ENCOUNTER — Encounter (HOSPITAL_BASED_OUTPATIENT_CLINIC_OR_DEPARTMENT_OTHER): Payer: Self-pay

## 2018-07-17 DIAGNOSIS — W208XXA Other cause of strike by thrown, projected or falling object, initial encounter: Secondary | ICD-10-CM | POA: Insufficient documentation

## 2018-07-17 DIAGNOSIS — Z79899 Other long term (current) drug therapy: Secondary | ICD-10-CM | POA: Insufficient documentation

## 2018-07-17 DIAGNOSIS — Z85828 Personal history of other malignant neoplasm of skin: Secondary | ICD-10-CM | POA: Insufficient documentation

## 2018-07-17 DIAGNOSIS — F329 Major depressive disorder, single episode, unspecified: Secondary | ICD-10-CM | POA: Diagnosis not present

## 2018-07-17 DIAGNOSIS — Z87891 Personal history of nicotine dependence: Secondary | ICD-10-CM | POA: Diagnosis not present

## 2018-07-17 DIAGNOSIS — S060X0A Concussion without loss of consciousness, initial encounter: Secondary | ICD-10-CM | POA: Insufficient documentation

## 2018-07-17 DIAGNOSIS — Y9389 Activity, other specified: Secondary | ICD-10-CM | POA: Diagnosis not present

## 2018-07-17 DIAGNOSIS — Y990 Civilian activity done for income or pay: Secondary | ICD-10-CM | POA: Diagnosis not present

## 2018-07-17 DIAGNOSIS — Y929 Unspecified place or not applicable: Secondary | ICD-10-CM | POA: Diagnosis not present

## 2018-07-17 DIAGNOSIS — S0990XA Unspecified injury of head, initial encounter: Secondary | ICD-10-CM | POA: Diagnosis present

## 2018-07-17 DIAGNOSIS — F419 Anxiety disorder, unspecified: Secondary | ICD-10-CM | POA: Diagnosis not present

## 2018-07-17 MED ORDER — ONDANSETRON 4 MG PO TBDP: 4.0000 mg | ORAL_TABLET | Freq: Three times a day (TID) | ORAL | 0 refills | Status: DC | PRN

## 2018-07-17 NOTE — ED Notes (Signed)
Pt in CT.

## 2018-07-17 NOTE — ED Notes (Signed)
Pt returned from CT °

## 2018-07-17 NOTE — ED Notes (Signed)
Pt verbalizes understanding of d/c instructions and denies any further needs at this time. 

## 2018-07-17 NOTE — ED Notes (Signed)
ED Provider at bedside. 

## 2018-07-17 NOTE — ED Provider Notes (Signed)
[facility] EMERGENCY DEPARTMENT Provider Note   CSN: [id number] Arrival date & time: 07/17/18  1846     History   Chief Complaint Chief Complaint  Patient presents with  . Head Injury    HPI Emily [name] is a 68 y.o. female.  The history is provided by the patient.  Head Injury  Location:  L parietal Time since incident:  11 hours Mechanism of injury comment:  Direct blow to the head when a solid wooden part of an urn fell 2.5 ft onto her head Pain details:    Quality:  Aching, pressure and throbbing   Severity:  Moderate   Timing:  Constant   Progression:  Worsening Chronicity:  New Relieved by:  None tried Worsened by:  Nothing Ineffective treatments:  None tried Associated symptoms: blurred vision   Associated symptoms comment:  Blurred vision in the left  Eye.  Intermittent nausea but no vomiting.  Earlier was saying the wrong words when she was trying to speak.  Mild neck pain.  No unilateral numbness or weakness in the extremities and no walking difficulty.  She does not take anticoagulation Risk factors: no alcohol use and no aspirin use     Past Medical History:  Diagnosis Date  . Allergy    seasonal   . Anxiety   . Cancer ([place]) 2009   squamous cell skin  . Cataract    bilateral surgery, left unsuccessful  . Depression   . Eating disorder   . GERD (gastroesophageal reflux disease)   . Glaucoma   . Hiatal hernia   . History of cardiovascular stress test 04/2000   cardiolyte  . History of flexible sigmoidoscopy 09/21/1999    Patient Active Problem List   Diagnosis Date Noted  . Hepatic steatosis 10/23/2016  . Ankle sprain 05/18/2011  . Chest wall soft tissue injury 05/18/2011  . Sprain of foot, left 05/18/2011  . VIRAL URI 12/01/2009  . ARTHRALGIA 11/10/2009  . WEIGHT GAIN 11/10/2009  . DYSPNEA 11/10/2009  . INGROWN NAIL 05/19/2009  . DEPRESSION 09/07/2008  . FOOT PAIN, BILATERAL 09/07/2008    Past Surgical History:  Procedure  Laterality Date  . ABDOMINAL HYSTERECTOMY    . APPENDECTOMY    . CARDIAC CATHETERIZATION  04/2000  . CARDIAC CATHETERIZATION    . COLONOSCOPY  12/31/09  . UPPER GASTROINTESTINAL ENDOSCOPY  09/15/1999     OB History   No obstetric history on file.      Home Medications    Prior to Admission medications   Medication Sig Start Date End Date Taking? Authorizing Provider  ALPRAZolam Duanne Moron) 1 MG tablet Take 1 mg by mouth 3 (three) times daily as needed. For anxiety.    [provider]  benzonatate (TESSALON PERLES) 100 MG capsule Take 1 capsule (100 mg total) by mouth 3 (three) times daily as needed. 04/18/18   Lucretia Kern, DO  colchicine 0.6 MG tablet Take 1 tablet (0.6 mg total) by mouth 2 (two) times daily as needed. For gout flareups. 01/21/16   Burchette, Alinda Sierras, MD  D3-50 50000 units capsule Take 50,000 Units by mouth once a week. 02/02/18   [provider]  desvenlafaxine (PRISTIQ) 100 MG 24 hr tablet Take 100 mg by mouth every morning. 01/02/18   [provider]  furosemide (LASIX) 20 MG tablet TAKE 1 TABLET BY MOUTH EVERY DAY AS NEEDED 06/11/18   Burchette, Alinda Sierras, MD  loratadine (CLARITIN) 10 MG tablet Take 10 mg by mouth  daily.    [provider]  traZODone (DESYREL) 100 MG tablet Take 300 mg by mouth at bedtime.     [provider]  triamcinolone ointment (KENALOG) 0.5 % Apply 1 application topically 2 (two) times daily. Patient taking differently: Apply 1 application topically as needed.  05/22/17   Dorothyann Peng, NP    Family History Family History  Problem Relation Age of Onset  . Arthritis Other   . Cancer Other        lung  . Coronary artery disease Other   . Stroke Other   . Colon cancer Neg Hx     Social History Social History   Tobacco Use  . Smoking status: Former Smoker    Packs/day: 0.50    Years: 20.00    Pack years: 10.00    Types: Cigarettes    Last attempt to quit: 05/27/2008    Years since quitting:  10.1  . Smokeless tobacco: Never Used  Substance Use Topics  . Alcohol use: Yes    Comment: occ  . Drug use: No     Allergies   Doxycycline; Penicillins; and Prednisone   Review of Systems Review of Systems  Eyes: Positive for blurred vision.  All other systems reviewed and are negative.    Physical Exam Updated Vital Signs BP (!) 148/70 (BP Location: Right Arm)   Pulse 61   Temp 98.1 F (36.7 C) (Oral)   Resp 16   Ht 5' 6.5" (1.689 m)   Wt 108.4 kg   SpO2 98%   BMI 38.00 kg/m   Physical Exam Vitals signs and nursing note reviewed.  Constitutional:      General: She is not in acute distress.    Appearance: She is well-developed.  HENT:     Head: Normocephalic and atraumatic.  Eyes:     Extraocular Movements: Extraocular movements intact.     Pupils: Pupils are equal, round, and reactive to light.  Neck:     Musculoskeletal: Normal range of motion and neck supple. Muscular tenderness present.  Cardiovascular:     Rate and Rhythm: Normal rate and regular rhythm.     Heart sounds: Normal heart sounds. No murmur. No friction rub.  Pulmonary:     Effort: Pulmonary effort is normal.     Breath sounds: Normal breath sounds. No wheezing or rales.  Musculoskeletal: Normal range of motion.        General: No tenderness.     Comments: No edema  Skin:    General: Skin is warm and dry.     Findings: No rash.  Neurological:     Mental Status: She is alert and oriented to person, place, and time.     Cranial Nerves: No cranial nerve deficit.     Sensory: No sensory deficit.     Motor: No weakness.     Coordination: Coordination normal.     Gait: Gait normal.     Comments: Intermittent slow speech but appropriate  Psychiatric:        Mood and Affect: Mood normal.        Behavior: Behavior normal.      ED Treatments / Results  Labs (all labs ordered are listed, but only abnormal results are displayed) Labs Reviewed - No data to  display  EKG None  Radiology Ct Head Wo Contrast  Result Date: 07/17/2018 CLINICAL DATA:  Head injury left parietal pain with nausea EXAM: CT HEAD WITHOUT CONTRAST CT CERVICAL SPINE WITHOUT CONTRAST TECHNIQUE:  Multidetector CT imaging of the head and cervical spine was performed following the standard protocol without intravenous contrast. Multiplanar CT image reconstructions of the cervical spine were also generated. COMPARISON:  MRI 09/14/2009 FINDINGS: CT HEAD FINDINGS Brain: No acute territorial infarction, hemorrhage or intracranial mass. Cystic area in the right temporal lobe, no change. Ventricles are nonenlarged. Vascular: No hyperdense vessels.  No unexpected calcification Skull: Normal. Negative for fracture or focal lesion. Sinuses/Orbits: No acute finding. Other: None CT CERVICAL SPINE FINDINGS Alignment: No subluxation.  Facet alignment is within normal limits. Skull base and vertebrae: No acute fracture. No primary bone lesion or focal pathologic process. Soft tissues and spinal canal: No prevertebral fluid or swelling. No visible canal hematoma. Disc levels: Mild degenerative changes at C4-C5. Moderate degenerative change at C5-C6 and C6-C7. Bilateral foraminal stenosis at C5-C6 and C6-C7. Upper chest: Negative. Other: None IMPRESSION: 1. No CT evidence for acute intracranial abnormality. Stable cystic air in the right temporal lobe 2. Degenerative changes of the cervical spine. No acute osseous abnormality Electronically Signed   By: Donavan Foil M.D.   On: 07/17/2018 21:54   Ct Cervical Spine Wo Contrast  Result Date: 07/17/2018 CLINICAL DATA:  Head injury left parietal pain with nausea EXAM: CT HEAD WITHOUT CONTRAST CT CERVICAL SPINE WITHOUT CONTRAST TECHNIQUE: Multidetector CT imaging of the head and cervical spine was performed following the standard protocol without intravenous contrast. Multiplanar CT image reconstructions of the cervical spine were also generated. COMPARISON:  MRI  09/14/2009 FINDINGS: CT HEAD FINDINGS Brain: No acute territorial infarction, hemorrhage or intracranial mass. Cystic area in the right temporal lobe, no change. Ventricles are nonenlarged. Vascular: No hyperdense vessels.  No unexpected calcification Skull: Normal. Negative for fracture or focal lesion. Sinuses/Orbits: No acute finding. Other: None CT CERVICAL SPINE FINDINGS Alignment: No subluxation.  Facet alignment is within normal limits. Skull base and vertebrae: No acute fracture. No primary bone lesion or focal pathologic process. Soft tissues and spinal canal: No prevertebral fluid or swelling. No visible canal hematoma. Disc levels: Mild degenerative changes at C4-C5. Moderate degenerative change at C5-C6 and C6-C7. Bilateral foraminal stenosis at C5-C6 and C6-C7. Upper chest: Negative. Other: None IMPRESSION: 1. No CT evidence for acute intracranial abnormality. Stable cystic air in the right temporal lobe 2. Degenerative changes of the cervical spine. No acute osseous abnormality Electronically Signed   By: Donavan Foil M.D.   On: 07/17/2018 21:54    Procedures Procedures (including critical care time)  Medications Ordered in ED Medications - No data to display   Initial Impression / Assessment and Plan / ED Course  I have reviewed the triage vital signs and the nursing notes.  Pertinent labs & imaging results that were available during my care of the patient were reviewed by me and considered in my medical decision making (see chart for details).     With a blow to the head today when a portion of the wooden urn fell on it.  She has had some blurry vision in her left eye, intermittent nausea and even some word finding difficulties earlier today.  Patient symptoms are most consistent with a concussion however will do a CT to rule out intercranial bleed.  She is not on anticoagulation but is also having some neck pain so we will CT to ensure no fracture.  10:51 PM CT neg for acute  findings.  Pt dx with concussion and gave expectations for typical course for post-concussive sx.  Final Clinical Impressions(s) / ED Diagnoses  Final diagnoses:  Concussion without loss of consciousness, initial encounter    ED Discharge Orders         Ordered    ondansetron (ZOFRAN ODT) 4 MG disintegrating tablet  Every 8 hours PRN     07/17/18 2230           Blanchie Dessert, MD 07/17/18 2251

## 2018-07-17 NOTE — Telephone Encounter (Signed)
Pt reports wooden case with glass weighing approximately 8 lbs fell from shelf about 2.5 feet and landed on top of pts head. States no lacerations, small bump presently. No LOC, "Saw stars."  Occurred at 1040 this AM. States initially nauseated, dizzy,vision blurry, severe headache. Also reports "Little foggy" and speech slightly slurred "For a while, into the afternoon"  after incident occurred.  Pt reports presently with mild nausea, blurred vision remains, and mild dizziness. Reports headache 6/10 after taking Advil. Pt directed to ED. States will follow disposition, husband will drive.  Reason for Disposition . [1] Loss of vision or double vision AND [2] present now  Answer Assessment - Initial Assessment Questions 1. MECHANISM: "How did the injury happen?" For falls, ask: "What height did you fall from?" and "What surface did you fall against?"      Heavy wooden case with glass fell from shelf onto pts head. 2. ONSET: "When did the injury happen?" (Minutes or hours ago)      1040 this am 3. NEUROLOGIC SYMPTOMS: "Was there any loss of consciousness?" "Are there any other neurological symptoms?"      No LOC. Neuro symptoms initially, some presently. See summary. 4. MENTAL STATUS: "Does the person know who he is, who you are, and where he is?"      yes 5. LOCATION: "What part of the head was hit?"      Top to over left eye 6. SCALP APPEARANCE: "What does the scalp look like? Is it bleeding now?" If so, ask: "Is it difficult to stop?"      Small bump 7. SIZE: For cuts, bruises, or swelling, ask: "How large is it?" (e.g., inches or ceneters)      None 8. PAIN: "Is there any pain?" If so, ask: "How bad is it?"  (e.g., Scale 1-10; or mild, moderate, severe)    6/10 presently,  After taking advil 9. TETANUS: For any breaks in the skin, ask: "When was the last tetanus booster?"     N/A 10. OTHER SYMPTOMS: "Do you have any other symptoms?" (e.g., neck pain, vomiting)      Vision blurry, mild  nausea, 6/10 headache. Speech slurred when initially happened.  Protocols used: HEAD INJURY-A-AH

## 2018-07-17 NOTE — ED Triage Notes (Addendum)
Pt states an urn and flag case fell on her head at work today-~8 lbs of hard wood at 1040am-pain to left parietal, nausea, vision changes, difficulty finding words-pt A/O-no LOC-NAD-steady gait

## 2018-07-18 NOTE — Telephone Encounter (Signed)
Pt seen at [facility] and evaluated.

## 2018-07-22 ENCOUNTER — Telehealth: Payer: Self-pay

## 2018-07-22 ENCOUNTER — Ambulatory Visit: Payer: Self-pay | Admitting: *Deleted

## 2018-07-22 ENCOUNTER — Ambulatory Visit: Payer: Self-pay | Admitting: Family Medicine
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
## 2018-07-23 ENCOUNTER — Encounter: Payer: Self-pay | Admitting: Family Medicine

## 2018-07-23 ENCOUNTER — Ambulatory Visit (INDEPENDENT_AMBULATORY_CARE_PROVIDER_SITE_OTHER): Payer: PPO | Admitting: Family Medicine

## 2018-07-23 DIAGNOSIS — S0990XA Unspecified injury of head, initial encounter: Secondary | ICD-10-CM

## 2018-07-23 HISTORY — DX: Unspecified injury of head, initial encounter: S09.90XA

## 2018-07-23 NOTE — Assessment & Plan Note (Signed)
Patient did have a head injury.  Patient did have the advanced imaging that was independently visualized by me showing no significant changes from previous imaging.  Patient is improving slowly.  May have a mild concussion with mild cognitive difficulties but symptoms list I think is also secondary to potentially visual disturbances from patient's irregularity of the lens replacement on the left side.  Discussed with patient about different treatment options and over-the-counter medications at the moment.  Patient was given light duty at work over the course of next 7 to 10 days and will follow-up at that time to make sure making significant improvement.  Likely will be able to be released at that time.

## 2018-07-23 NOTE — Patient Instructions (Signed)
Good to see you  Fish oil 2 grams daily for 10 days  Add 1000IU daily of vitamin D CoQ10 200mg  daily  Get yo back to work on Thursday  See me again in 1 week to make sure you are good for your valentine date

## 2018-07-26 ENCOUNTER — Other Ambulatory Visit: Payer: Self-pay

## 2018-07-26 ENCOUNTER — Emergency Department (HOSPITAL_BASED_OUTPATIENT_CLINIC_OR_DEPARTMENT_OTHER): Payer: Worker's Compensation

## 2018-07-26 ENCOUNTER — Emergency Department (HOSPITAL_BASED_OUTPATIENT_CLINIC_OR_DEPARTMENT_OTHER)
Admission: EM | Admit: 2018-07-26 | Discharge: 2018-07-26 | Disposition: A | Discharge status: Home / Self Care | Payer: Worker's Compensation | Attending: Emergency Medicine | Admitting: Emergency Medicine

## 2018-07-26 ENCOUNTER — Encounter (HOSPITAL_BASED_OUTPATIENT_CLINIC_OR_DEPARTMENT_OTHER): Payer: Self-pay | Admitting: *Deleted

## 2018-07-26 ENCOUNTER — Ambulatory Visit: Payer: Self-pay

## 2018-07-26 DIAGNOSIS — F0781 Postconcussional syndrome: Secondary | ICD-10-CM | POA: Diagnosis not present

## 2018-07-26 DIAGNOSIS — Z87891 Personal history of nicotine dependence: Secondary | ICD-10-CM | POA: Insufficient documentation

## 2018-07-26 DIAGNOSIS — R42 Dizziness and giddiness: Secondary | ICD-10-CM | POA: Insufficient documentation

## 2018-07-26 DIAGNOSIS — Z79899 Other long term (current) drug therapy: Secondary | ICD-10-CM | POA: Diagnosis not present

## 2018-07-26 MED ORDER — DEXAMETHASONE 6 MG PO TABS
10.0000 mg | ORAL_TABLET | Freq: Once | ORAL | Status: AC
  Administered 2018-07-26: 10 mg via ORAL
  Filled 2018-07-26: qty 1

## 2018-07-26 MED ORDER — DIPHENHYDRAMINE HCL 50 MG/ML IJ SOLN
25.0000 mg | Freq: Once | INTRAMUSCULAR | Status: AC
  Administered 2018-07-26: 25 mg via INTRAVENOUS
  Filled 2018-07-26: qty 1

## 2018-07-26 MED ORDER — PROCHLORPERAZINE EDISYLATE 10 MG/2ML IJ SOLN
10.0000 mg | Freq: Once | INTRAMUSCULAR | Status: AC
  Administered 2018-07-26: 10 mg via INTRAVENOUS
  Filled 2018-07-26: qty 2

## 2018-07-26 NOTE — ED Notes (Signed)
PT states understanding of care given, follow up care, and medication prescribed. PT ambulated from ED to car with a steady gait. 

## 2018-07-26 NOTE — Telephone Encounter (Signed)
Will hold to follow up on ED arrival

## 2018-07-26 NOTE — Discharge Instructions (Signed)
Follow up with your headache specialist.  Return to the ED for worsening headache, difficulty with speech or swallowing, unilateral numbness or weakness.
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
## 2018-07-26 NOTE — ED Provider Notes (Signed)
[facility] Provider Note   CSN: [id number] Arrival date & time: 07/26/18  1920     History   Chief Complaint Chief Complaint  Patient presents with  . Dizziness    HPI Emily [name] is a 68 y.o. female.  68 yo F with a chief complaint of a headache.  The patient had had 2 recent low impact head injuries and has been seen by the concussion clinic.  She had a CT scan of the original injury in January that was negative.  She has been having some left-sided symptoms where she feels like her coordination is off and that she has been increasingly dizzy.  She is also had fluctuating moods and is been very emotional.  States that she will cry spontaneously.  She has been having trouble remembering certain things.  The history is provided by the patient.  Dizziness  Associated symptoms: headaches   Associated symptoms: no chest pain, no nausea, no palpitations, no shortness of breath and no vomiting   Illness  Severity:  Moderate Onset quality:  Sudden Duration:  2 days Timing:  Constant Progression:  Worsening Chronicity:  Recurrent Associated symptoms: headaches   Associated symptoms: no chest pain, no congestion, no fever, no myalgias, no nausea, no rhinorrhea, no shortness of breath, no vomiting and no wheezing     Past Medical History:  Diagnosis Date  . Allergy    seasonal   . Anxiety   . Cancer ([place]) 2009   squamous cell skin  . Cataract    bilateral surgery, left unsuccessful  . Depression   . Eating disorder   . GERD (gastroesophageal reflux disease)   . Glaucoma   . Hiatal hernia   . History of cardiovascular stress test 04/2000   cardiolyte  . History of flexible sigmoidoscopy 09/21/1999    Patient Active Problem List   Diagnosis Date Noted  . Head injury 07/23/2018  . Hepatic steatosis 10/23/2016  . Ankle sprain 05/18/2011  . Chest wall soft tissue injury 05/18/2011  . Sprain of foot, left 05/18/2011  . VIRAL URI  12/01/2009  . ARTHRALGIA 11/10/2009  . WEIGHT GAIN 11/10/2009  . DYSPNEA 11/10/2009  . INGROWN NAIL 05/19/2009  . DEPRESSION 09/07/2008  . FOOT PAIN, BILATERAL 09/07/2008    Past Surgical History:  Procedure Laterality Date  . ABDOMINAL HYSTERECTOMY    . APPENDECTOMY    . CARDIAC CATHETERIZATION  04/2000  . CARDIAC CATHETERIZATION    . COLONOSCOPY  12/31/09  . UPPER GASTROINTESTINAL ENDOSCOPY  09/15/1999     OB History   No obstetric history on file.      Home Medications    Prior to Admission medications   Medication Sig Start Date End Date Taking? Authorizing Provider  ALPRAZolam Duanne Moron) 1 MG tablet Take 1 mg by mouth 3 (three) times daily as needed. For anxiety.    [provider]  benzonatate (TESSALON PERLES) 100 MG capsule Take 1 capsule (100 mg total) by mouth 3 (three) times daily as needed. 04/18/18   Lucretia Kern, DO  colchicine 0.6 MG tablet Take 1 tablet (0.6 mg total) by mouth 2 (two) times daily as needed. For gout flareups. 01/21/16   Burchette, Alinda Sierras, MD  D3-50 50000 units capsule Take 50,000 Units by mouth once a week. 02/02/18   [provider]  desvenlafaxine (PRISTIQ) 100 MG 24 hr tablet Take 100 mg by mouth every morning. 01/02/18   [provider]  furosemide (LASIX) 20 MG  tablet TAKE 1 TABLET BY MOUTH EVERY DAY AS NEEDED 06/11/18   Burchette, Alinda Sierras, MD  loratadine (CLARITIN) 10 MG tablet Take 10 mg by mouth daily.    [provider]  ondansetron (ZOFRAN ODT) 4 MG disintegrating tablet Take 1 tablet (4 mg total) by mouth every 8 (eight) hours as needed for nausea or vomiting. 07/17/18   Blanchie Dessert, MD  traZODone (DESYREL) 100 MG tablet Take 300 mg by mouth at bedtime.     [provider]  triamcinolone ointment (KENALOG) 0.5 % Apply 1 application topically 2 (two) times daily. Patient taking differently: Apply 1 application topically as needed.  05/22/17   Dorothyann Peng, NP    Family History Family  History  Problem Relation Age of Onset  . Arthritis Other   . Cancer Other        lung  . Coronary artery disease Other   . Stroke Other   . Colon cancer Neg Hx     Social History Social History   Tobacco Use  . Smoking status: Former Smoker    Packs/day: 0.50    Years: 20.00    Pack years: 10.00    Types: Cigarettes    Last attempt to quit: 05/27/2008    Years since quitting: 10.1  . Smokeless tobacco: Never Used  Substance Use Topics  . Alcohol use: Yes    Comment: occ  . Drug use: No     Allergies   Doxycycline; Penicillins; and Prednisone   Review of Systems Review of Systems  Constitutional: Negative for chills and fever.  HENT: Negative for congestion and rhinorrhea.   Eyes: Negative for redness and visual disturbance.  Respiratory: Negative for shortness of breath and wheezing.   Cardiovascular: Negative for chest pain and palpitations.  Gastrointestinal: Negative for nausea and vomiting.  Genitourinary: Negative for dysuria and urgency.  Musculoskeletal: Negative for arthralgias and myalgias.  Skin: Negative for pallor and wound.  Neurological: Positive for headaches. Negative for dizziness.  Psychiatric/Behavioral: Positive for confusion and dysphoric mood.     Physical Exam Updated Vital Signs BP (!) 152/79   Pulse 85   Temp 97.7 F (36.5 C) (Oral)   Resp 18   Ht 5' 6.5" (1.689 m)   Wt 108.4 kg   SpO2 100%   BMI 37.99 kg/m   Physical Exam Vitals signs and nursing note reviewed.  Constitutional:      General: She is not in acute distress.    Appearance: She is well-developed. She is not diaphoretic.  HENT:     Head: Normocephalic and atraumatic.  Eyes:     Pupils: Pupils are equal, round, and reactive to light.  Neck:     Musculoskeletal: Normal range of motion and neck supple.  Cardiovascular:     Rate and Rhythm: Normal rate and regular rhythm.     Heart sounds: No murmur. No friction rub. No gallop.   Pulmonary:     Effort:  Pulmonary effort is normal.     Breath sounds: No wheezing or rales.  Abdominal:     General: There is no distension.     Palpations: Abdomen is soft.     Tenderness: There is no abdominal tenderness.  Musculoskeletal:        General: No tenderness.  Skin:    General: Skin is warm and dry.  Neurological:     Mental Status: She is alert and oriented to person, place, and time.     Cranial Nerves: Cranial nerves  are intact.     Motor: Motor function is intact.     Coordination: Coordination is intact.     Gait: Gait is intact.     Comments: Right-sided fast-going nystagmus  Psychiatric:        Behavior: Behavior normal.      ED Treatments / Results  Labs (all labs ordered are listed, but only abnormal results are displayed) Labs Reviewed - No data to display  EKG None  Radiology Ct Head Wo Contrast  Result Date: 07/26/2018 CLINICAL DATA:  Worst headache of her life today. Head injury 10 days ago. EXAM: CT HEAD WITHOUT CONTRAST TECHNIQUE: Contiguous axial images were obtained from the base of the skull through the vertex without intravenous contrast. COMPARISON:  07/17/2018. FINDINGS: Brain: Stable moderate enlargement of the subarachnoid spaces superior to the cerebral hemispheres and mild enlargement of the remainder of the subarachnoid spaces. Stable minimally enlarged ventricles. Stable previously noted cystic area in the right temporal lobe period No intracranial hemorrhage, mass lesion or CT evidence of acute infarction. Vascular: No hyperdense vessel or unexpected calcification. Skull: Bilateral hyperostosis frontalis, greater on the left. No skull fractures. Sinuses/Orbits: Status post bilateral cataract extraction. Unremarkable included paranasal sinuses. Other: None. IMPRESSION: 1. No acute abnormality. 2. Stable atrophy and right temporal lobe cystic area. Electronically Signed   By: Claudie Revering M.D.   On: 07/26/2018 20:25    Procedures Procedures (including critical  care time)  Medications Ordered in ED Medications  dexamethasone (DECADRON) tablet 10 mg (has no administration in time range)  prochlorperazine (COMPAZINE) injection 10 mg (10 mg Intravenous Given 07/26/18 1957)  diphenhydrAMINE (BENADRYL) injection 25 mg (25 mg Intravenous Given 07/26/18 1957)     Initial Impression / Assessment and Plan / ED Course  I have reviewed the triage vital signs and the nursing notes.  Pertinent labs & imaging results that were available during my care of the patient were reviewed by me and considered in my medical decision making (see chart for details).     68 yo F with a chief complaint of headache confusion and emotional lability.  Most likely this is postconcussive syndrome as the patient had recently had 2 head injuries and was evaluated by concussion clinic.  My neuro exam is benign.  She was seen by her ophthalmologist yesterday with a benign ophthalmologic exam.  I wonder if some of the symptoms are related to depression.  Her headache is significantly improved with a headache cocktail.  I repeated a head CT today as she said her headache was the worst of her life this morning, negative for acute intracranial bleeding.  We will have her follow-up in the concussion clinic.  As she just had a back work yesterday at the onset of her symptoms I suggested she stay out of work until reevaluated by her doctor.  9:04 PM:  I have discussed the diagnosis/risks/treatment options with the patient and family and believe the pt to be eligible for discharge home to follow-up with PCP. We also discussed returning to the ED immediately if new or worsening sx occur. We discussed the sx which are most concerning (e.g., sudden worsening pain, fever, inability to tolerate by mouth, stoke s/sx) that necessitate immediate return. Medications administered to the patient during their visit and any new prescriptions provided to the patient are listed below.  Medications given during this  visit Medications  dexamethasone (DECADRON) tablet 10 mg (has no administration in time range)  prochlorperazine (COMPAZINE) injection 10 mg (10 mg  Intravenous Given 07/26/18 1957)  diphenhydrAMINE (BENADRYL) injection 25 mg (25 mg Intravenous Given 07/26/18 1957)     The patient appears reasonably screen and/or stabilized for discharge and I doubt any other medical condition or other [facility] requiring further screening, evaluation, or treatment in the ED at this time prior to discharge.    Final Clinical Impressions(s) / ED Diagnoses   Final diagnoses:  Post concussive syndrome    ED Discharge Orders    None       Deno Etienne, DO 07/26/18 2104

## 2018-07-26 NOTE — Telephone Encounter (Signed)
ret'd call to pt.  Stated since [place]. AM she has had an increased fear, difficulty with speech as in searching for words, at times, and difficulty completing simple tasks at work.  Stated she woke up at 3:15 AM with a headache, and it worsened by 5:30 AM.  Reported increased sensitivity to light this AM, when headache was at its worst.  C/o nausea.  Denied vomiting.  Reported "it was the worst headache of my life."  Stated it was at 10/10, and now is down to 6/10.  Stated she took Ibuprofen for the headache.  Reported feeling more emotional.  Was able to articulate her symptoms in detail during this call; no difficulty noted with speech.  Advised due to change in symptoms from initial evaluation, she should go to ER.  Pt. Verb. Understanding.        Reason for Disposition . Concussion symptoms are worsening  Answer Assessment - Initial Assessment Questions 1. SYMPTOMS: "What symptom are you most concerned about?"     More frightened, difficulty completing simple tasks at work  2. OTHER SYMPTOMS: "Do you have any other symptoms?" (e.g., nausea, difficulty concentrating)     Onset of Bad headache at 5:30 AM today; sensitivity to light; nausea  3. ONSET / PATTERN:  "Are you the same, getting better, or getting worse?"  "What's changed?"     See # 1; also has a heightened sensitivity when approached on the left side   4. HEADACHE: "Do you have any headache pain?" If so, ask: "How bad is it?"  (Scale 1-10; or mild, moderate, severe)   - MILD - Doesn't interfere with normal activities   - MODERATE - Interferes with normal activities (e.g., work or school) or awakens from sleep   - SEVERE - Excruciating pain, unable to do any normal activities    6/10; was 10/10 this morning 5. mTBI: "When did your head injury happen?" "How did it occur?"      Hit in head by a flag box 6. mTBI DIAGNOSIS:  "Who diagnosed your concussion?"     2/4/207.  mTBI TESTING: "Did you have a CT scan or MRI of your head?"  *No Answer* 8. mTBI LAST VISIT:  "When were you seen for your head injury?"     *No Answer* 9. MEDICATIONS:  "Did you receive any prescription meds?"  If so, ask:  "What are they?" " Were any OTC meds recommended?"     *No Answer* 10. FOLLOW-UP APPT:  "Do you have a follow-up appointment?"       *No Answer*  Protocols used: CONCUSSION (MTBI) LESS THAN 14 DAYS AGO FOLLOW-UP CALL-A-AH  Message from Rayann Heman sent at 07/26/2018 4:01 PM EST   Summary: concussion    Pt called and stated that she can not get a hold of the concussion clinic and stated that she is having new symptoms. Pt states that she is afraid. Said that new symptoms was being afraid. Pt did not want to wait for triage nurse. Originally hung up. Please advise

## 2018-07-26 NOTE — ED Notes (Signed)
ED Provider at bedside. 

## 2018-07-26 NOTE — ED Notes (Signed)
Pt transported to CT via stretcher at this time.  

## 2018-07-29 ENCOUNTER — Telehealth: Payer: Self-pay

## 2018-07-29 NOTE — Telephone Encounter (Signed)
Pt seen at [facility] ED.

## 2018-07-29 NOTE — Telephone Encounter (Signed)
Left message to call back in regards to phone call into Concussion Clinic.

## 2018-07-29 NOTE — Progress Notes (Signed)
[facility] [address], [place] [address] Phone: [phone] Subjective:   Fontaine No, am serving as a scribe for Dr. Hulan Saas.  I'm seeing this patient by the request  of:    CC: Head injury follow-up  TML:[id number]   07/23/2018:  Patient did have a head injury.  Patient did have the advanced imaging that was independently visualized by me showing no significant changes from previous imaging.  Patient is improving slowly.  May have a mild concussion with mild cognitive difficulties but symptoms list I think is also secondary to potentially visual disturbances from patient's irregularity of the lens replacement on the left side.  Discussed with patient about different treatment options and over-the-counter medications at the moment.  Patient was given light duty at work over the course of next 7 to 10 days and will follow-up at that time to make sure making significant improvement.  Likely will be able to be released at that time. Update 07/30/2018:  Emily [name] is a 68 y.o. female coming in with complaint of head injury. Patient was to return to work on the 6th of February. Noted in chart patient returned to ED on 07/26/2018. Patient states that she is feeling a lot better than last visit. Had an intense headache and that is what took her into the ED. Patient did try to go to work and could not remember to send emails and this scared her. Has not had headache for 2 days. Did have left eye checked for blurriness and this came back negative. Has tried short amounts of screen time and it doing better with it.    Repeat CT scan on July 26, 2018 was independently visualized by me showing the same mild atrophy and abnormality of the right temporal lobe.  No significant change from previous one 1 week before that.  The same lesion was seen on MRIs back in 2002.  Past Medical History:  Diagnosis Date  . Allergy    seasonal   . Anxiety   . Cancer  ([place]) 2009   squamous cell skin  . Cataract    bilateral surgery, left unsuccessful  . Depression   . Eating disorder   . GERD (gastroesophageal reflux disease)   . Glaucoma   . Hiatal hernia   . History of cardiovascular stress test 04/2000   cardiolyte  . History of flexible sigmoidoscopy 09/21/1999   Past Surgical History:  Procedure Laterality Date  . ABDOMINAL HYSTERECTOMY    . APPENDECTOMY    . CARDIAC CATHETERIZATION  04/2000  . CARDIAC CATHETERIZATION    . COLONOSCOPY  12/31/09  . UPPER GASTROINTESTINAL ENDOSCOPY  09/15/1999   Social History   Socioeconomic History  . Marital status: Married    Spouse name: Not on file  . Number of children: Not on file  . Years of education: Not on file  . Highest education level: Not on file  Occupational History  . Not on file  Social Needs  . Financial resource strain: Not on file  . Food insecurity:    Worry: Not on file    Inability: Not on file  . Transportation needs:    Medical: Not on file    Non-medical: Not on file  Tobacco Use  . Smoking status: Former Smoker    Packs/day: 0.50    Years: 20.00    Pack years: 10.00    Types: Cigarettes    Last attempt to quit: 05/27/2008  Years since quitting: 10.1  . Smokeless tobacco: Never Used  Substance and Sexual Activity  . Alcohol use: Yes    Comment: occ  . Drug use: No  . Sexual activity: Not on file  Lifestyle  . Physical activity:    Days per week: Not on file    Minutes per session: Not on file  . Stress: Not on file  Relationships  . Social connections:    Talks on phone: Not on file    Gets together: Not on file    Attends religious service: Not on file    Active member of club or organization: Not on file    Attends meetings of clubs or organizations: Not on file    Relationship status: Not on file  Other Topics Concern  . Not on file  Social History Narrative  . Not on file   Allergies  Allergen Reactions  . Doxycycline     Gi upset  .  Penicillins     REACTION: rash, aggression Has patient had a PCN reaction causing immediate rash, facial/tongue/throat swelling, SOB or lightheadedness with hypotension: YES Has patient had a PCN reaction causing severe rash involving mucus membranes or skin necrosis: NO Has patient had a PCN reaction that required hospitalizationNO Has patient had a PCN reaction occurring within the last 10 years:NO If all of the above answers are "NO", then may proceed with Cephalosporin use.   . Prednisone     REACTION: agressive behavior   Family History  Problem Relation Age of Onset  . Arthritis Other   . Cancer Other        lung  . Coronary artery disease Other   . Stroke Other   . Colon cancer Neg Hx      Current Outpatient Medications (Cardiovascular):  .  furosemide (LASIX) 20 MG tablet, TAKE 1 TABLET BY MOUTH EVERY DAY AS NEEDED  Current Outpatient Medications (Respiratory):  .  benzonatate (TESSALON PERLES) 100 MG capsule, Take 1 capsule (100 mg total) by mouth 3 (three) times daily as needed. .  loratadine (CLARITIN) 10 MG tablet, Take 10 mg by mouth daily.  Current Outpatient Medications (Analgesics):  .  colchicine 0.6 MG tablet, Take 1 tablet (0.6 mg total) by mouth 2 (two) times daily as needed. For gout flareups.   Current Outpatient Medications (Other):  [name]  ALPRAZolam (XANAX) 1 MG tablet, Take 1 mg by mouth 3 (three) times daily as needed. For anxiety. .  D3-50 50000 units capsule, Take 50,000 Units by mouth once a week. .  desvenlafaxine (PRISTIQ) 100 MG 24 hr tablet, Take 100 mg by mouth every morning. .  ondansetron (ZOFRAN ODT) 4 MG disintegrating tablet, Take 1 tablet (4 mg total) by mouth every 8 (eight) hours as needed for nausea or vomiting. .  traZODone (DESYREL) 100 MG tablet, Take 300 mg by mouth at bedtime.  .  triamcinolone ointment (KENALOG) 0.5 %, Apply 1 application topically 2 (two) times daily. (Patient taking differently: Apply 1 application topically as  needed. )    Past medical history, social, surgical and family history all reviewed in electronic medical record.  No pertanent information unless stated regarding to the chief complaint.   Review of Systems:  No  visual changes, nausea, vomiting, diarrhea, constipation, dizziness, abdominal pain, skin rash, fevers, chills, night sweats, weight loss, swollen lymph nodes, body aches, joint swelling,  chest pain, shortness of breath, mood changes.  Positive muscle aches, headaches  Objective  Blood pressure 120/82, pulse  91, height 5' 6.5" (1.689 m), weight 240 lb (108.9 kg), SpO2 97 %.   General: No apparent distress alert and oriented x3 mood and affect normal, dressed appropriately.  HEENT: Pupils equal, extraocular movements intact  Respiratory: Patient's speak in full sentences and does not appear short of breath  Cardiovascular: No lower extremity edema, non tender, no erythema  Skin: Warm dry intact with no signs of infection or rash on extremities or on axial skeleton.  Abdomen: Soft nontender  Neuro: Cranial nerves II through XII are intact, neurovascularly intact in all extremities with 2+ DTRs and 2+ pulses.  Lymph: No lymphadenopathy of posterior or anterior cervical chain or axillae bilaterally.  Gait normal with good balance and coordination.  MSK:  Non tender with full range of motion and good stability and symmetric strength and tone of shoulders, elbows, wrist, hip, knee and ankles bilaterally.  Mild arthritic changes.  Patient did do well with serial sevens.  Does have some mild balance abnormalities at 26 out of 30.  Patient though recall is doing really well.  Patient's vestibular neuro seems to be intact with no significant nystagmus noted on concussion testing today.     Impression and Recommendations:     This case required medical decision making of moderate complexity. The above documentation has been reviewed and is accurate and complete Lyndal Pulley,  DO       Note: This dictation was prepared with Dragon dictation along with smaller phrase technology. Any transcriptional errors that result from this process are unintentional.

## 2018-07-30 ENCOUNTER — Ambulatory Visit: Payer: PPO | Admitting: Family Medicine

## 2018-07-30 ENCOUNTER — Encounter: Payer: Self-pay | Admitting: Family Medicine

## 2018-07-30 DIAGNOSIS — S0990XD Unspecified injury of head, subsequent encounter: Secondary | ICD-10-CM

## 2018-07-30 NOTE — Patient Instructions (Signed)
Good to see you  COntinue the vitamins until all better We will get you back slowly to work  I think you should be great in 1-2 weeks Se eme again in 2 weeks if still concern

## 2018-07-30 NOTE — Assessment & Plan Note (Signed)
Patient does have more of a head injury.  Once again difficult to call true concussion at this time.  Patient symptoms on exam to be more anxiety problem than anything else.  We discussed part-time work of 20 hours this week, 30 hours the next week, and then increasing from there to a full duty.  Patient will continue with the vitamin supplementations.  I do believe that patient's underlying depression is likely contributing.  Patient is likely going to turn this into Workmen's Comp. and may need to see another provider as well for further evaluation then.  Patient otherwise can follow-up with me again in 2 to 3 weeks to make sure doing well.  Do not feel advanced imaging is necessary.

## 2018-08-12 NOTE — Progress Notes (Signed)
[facility] [place], [place] [address] Phone: [phone] Subjective:   :    CC: Head injury follow-up  WLN:[id number]     Update 08/13/2018: Emily [name] is a 68 y.o. female coming in with complaint of head injury. Patient states that she has been back to work full time. She feels that her eyes are getting tired as the day goes on. Patient has been getting headaches which she feels is from her dog, not the head injury. Does feel like she still has to search for words still but is not saying the incorrect words as she was when we first saw her.  Patient would state that she is about 70 to 80% better.     Past Medical History:  Diagnosis Date  . Allergy    seasonal   . Anxiety   . Cancer ([place]) 2009   squamous cell skin  . Cataract    bilateral surgery, left unsuccessful  . Depression   . Eating disorder   . GERD (gastroesophageal reflux disease)   . Glaucoma   . Hiatal hernia   . History of cardiovascular stress test 04/2000   cardiolyte  . History of flexible sigmoidoscopy 09/21/1999   Past Surgical History:  Procedure Laterality Date  . ABDOMINAL HYSTERECTOMY    . APPENDECTOMY    . CARDIAC CATHETERIZATION  04/2000  . CARDIAC CATHETERIZATION    . COLONOSCOPY  12/31/09  . UPPER GASTROINTESTINAL ENDOSCOPY  09/15/1999   Social History   Socioeconomic History  . Marital status: Married    Spouse name: Not on file  . Number of children: Not on file  . Years of education: Not on file  . Highest education level: Not on file  Occupational History  . Not on file  Social Needs  . Financial resource strain: Not on file  . Food insecurity:    Worry: Not on file    Inability: Not on file  . Transportation needs:    Medical: Not on file    Non-medical: Not on file  Tobacco Use  . Smoking status: Former Smoker    Packs/day: 0.50    Years: 20.00    Pack years: 10.00    Types: Cigarettes    Last attempt to quit: 05/27/2008   Years since quitting: 10.2  . Smokeless tobacco: Never Used  Substance and Sexual Activity  . Alcohol use: Yes    Comment: occ  . Drug use: No  . Sexual activity: Not on file  Lifestyle  . Physical activity:    Days per week: Not on file    Minutes per session: Not on file  . Stress: Not on file  Relationships  . Social connections:    Talks on phone: Not on file    Gets together: Not on file    Attends religious service: Not on file    Active member of club or organization: Not on file    Attends meetings of clubs or organizations: Not on file    Relationship status: Not on file  Other Topics Concern  . Not on file  Social History Narrative  . Not on file   Allergies  Allergen Reactions  . Doxycycline     Gi upset  . Penicillins     REACTION: rash, aggression Has patient had a PCN reaction causing immediate rash, facial/tongue/throat swelling, SOB or lightheadedness with hypotension: YES Has patient had a PCN reaction causing severe rash involving mucus membranes  or skin necrosis: NO Has patient had a PCN reaction that required hospitalizationNO Has patient had a PCN reaction occurring within the last 10 years:NO If all of the above answers are "NO", then may proceed with Cephalosporin use.   . Prednisone     REACTION: agressive behavior   Family History  Problem Relation Age of Onset  . Arthritis Other   . Cancer Other        lung  . Coronary artery disease Other   . Stroke Other   . Colon cancer Neg Hx      Current Outpatient Medications (Cardiovascular):  .  furosemide (LASIX) 20 MG tablet, TAKE 1 TABLET BY MOUTH EVERY DAY AS NEEDED  Current Outpatient Medications (Respiratory):  .  benzonatate (TESSALON PERLES) 100 MG capsule, Take 1 capsule (100 mg total) by mouth 3 (three) times daily as needed. .  loratadine (CLARITIN) 10 MG tablet, Take 10 mg by mouth daily.  Current Outpatient Medications (Analgesics):  .  colchicine 0.6 MG tablet, Take 1 tablet  (0.6 mg total) by mouth 2 (two) times daily as needed. For gout flareups.   Current Outpatient Medications (Other):  [name]  ALPRAZolam (XANAX) 1 MG tablet, Take 1 mg by mouth 3 (three) times daily as needed. For anxiety. .  D3-50 50000 units capsule, Take 50,000 Units by mouth once a week. .  desvenlafaxine (PRISTIQ) 100 MG 24 hr tablet, Take 100 mg by mouth every morning. .  ondansetron (ZOFRAN ODT) 4 MG disintegrating tablet, Take 1 tablet (4 mg total) by mouth every 8 (eight) hours as needed for nausea or vomiting. .  traZODone (DESYREL) 100 MG tablet, Take 300 mg by mouth at bedtime.  .  triamcinolone ointment (KENALOG) 0.5 %, Apply 1 application topically 2 (two) times daily. (Patient taking differently: Apply 1 application topically as needed. )    Past medical history, social, surgical and family history all reviewed in electronic medical record.  No pertanent information unless stated regarding to the chief complaint.   Review of Systems:  No  visual changes, nausea, vomiting, diarrhea, constipation, dizziness, abdominal pain, skin rash, fevers, chills, night sweats, weight loss, swollen lymph nodes, body aches, joint swelling, chest pain, shortness of breath, mood changes.  Positive headaches and muscle aches  Objective  There were no vitals taken for this visit. Systems examined below as of    General: No apparent distress alert and oriented x3 mood and affect normal, dressed appropriately.  HEENT: Pupils equal, extraocular movements intact  Respiratory: Patient's speak in full sentences and does not appear short of breath  Cardiovascular: No lower extremity edema, non tender, no erythema  Skin: Warm dry intact with no signs of infection or rash on extremities or on axial skeleton.  Abdomen: Soft nontender  Neuro: Cranial nerves II through XII are intact, neurovascularly intact in all extremities with 2+ DTRs and 2+ pulses.  Lymph: No lymphadenopathy of posterior or anterior  cervical chain or axillae bilaterally.  Gait normal with good balance and coordination.  MSK:  tender with limited range of motion and good stability and symmetric strength and tone of shoulders, elbows, wrist, hip, knee and ankles bilaterally.     Impression and Recommendations:     This case required medical decision making of moderate complexity. The above documentation has been reviewed and is accurate and complete Lyndal Pulley, DO       Note: This dictation was prepared with Dragon dictation along with smaller phrase technology. Any transcriptional errors  that result from this process are unintentional.

## 2018-08-13 ENCOUNTER — Encounter: Payer: Self-pay | Admitting: Family Medicine

## 2018-08-13 ENCOUNTER — Ambulatory Visit: Payer: PPO | Admitting: Family Medicine

## 2018-08-13 DIAGNOSIS — S0990XD Unspecified injury of head, subsequent encounter: Secondary | ICD-10-CM | POA: Diagnosis not present

## 2018-08-13 NOTE — Patient Instructions (Signed)
I am happy you are better  Keep it up  Probably still a couple weeks away form being yourself.  Turmeric 500mg  daily  See me again in 3-4 weeks if not perfect

## 2018-08-13 NOTE — Assessment & Plan Note (Signed)
Much better at this time..  Patient does have significant number of comorbidities that I think also contribute to some of the discomfort and pain though.  I believe the patient will continue to improve, no change in management continue with the vitamins and trying to increase activity.  Follow-up again 4 weeks if not completely resolved.

## 2018-08-14 ENCOUNTER — Telehealth: Payer: Self-pay

## 2018-08-14 NOTE — Telephone Encounter (Signed)
Left message for patient to pick up letter at front desk.

## 2018-08-14 NOTE — Telephone Encounter (Signed)
Copied from [place] [id number]. Topic: General - Other >> Aug 14, 2018  3:02 PM Lennox Solders wrote: Reason for CRM: pt needs a note for work stating she is still under dr Tamala Julian care for concussion. Pt will pick up the note when its ready

## 2018-09-04 ENCOUNTER — Ambulatory Visit: Payer: PPO | Admitting: Family Medicine

## 2018-09-04 ENCOUNTER — Encounter: Payer: Self-pay | Admitting: Family Medicine

## 2018-09-04 ENCOUNTER — Other Ambulatory Visit: Payer: Self-pay

## 2018-09-04 DIAGNOSIS — S0990XD Unspecified injury of head, subsequent encounter: Secondary | ICD-10-CM | POA: Diagnosis not present

## 2018-09-04 NOTE — Patient Instructions (Addendum)
Good to see you  You will do great  Please be safe and healthy  See me when you need me

## 2018-09-04 NOTE — Assessment & Plan Note (Signed)
Patient initially had a head injury, mild abnormal CT scan had been stable from previous imaging.  I believe the patient is back to her maximal medical improvement and is at her baseline. Do not expect any type of long-term difficulties.  Patient has been working and is able to do so with no impairment.

## 2018-09-04 NOTE — Progress Notes (Signed)
[facility] [place], [place] [address] Phone: [phone] Subjective:       CC: Head injury follow-up  CNO:[id number]   08/13/2018:  Much better at this time..  Patient does have significant number of comorbidities that I think also contribute to some of the discomfort and pain though.  I believe the patient will continue to improve, no change in management continue with the vitamins and trying to increase activity.  Follow-up again 4 weeks if not completely resolved.  Update 09/04/2018: Emily [name] is a 68 y.o. female coming in with complaint of follow up for head injury. Patient said that yesterday she saw something in her field of vision. States that she got lost today coming to her appointment. Is back to work full time. Headaches have diminished. Is no longer having symptoms at work.      Past Medical History:  Diagnosis Date  . Allergy    seasonal   . Anxiety   . Cancer ([place]) 2009   squamous cell skin  . Cataract    bilateral surgery, left unsuccessful  . Depression   . Eating disorder   . GERD (gastroesophageal reflux disease)   . Glaucoma   . Hiatal hernia   . History of cardiovascular stress test 04/2000   cardiolyte  . History of flexible sigmoidoscopy 09/21/1999   Past Surgical History:  Procedure Laterality Date  . ABDOMINAL HYSTERECTOMY    . APPENDECTOMY    . CARDIAC CATHETERIZATION  04/2000  . CARDIAC CATHETERIZATION    . COLONOSCOPY  12/31/09  . UPPER GASTROINTESTINAL ENDOSCOPY  09/15/1999   Social History   Socioeconomic History  . Marital status: Married    Spouse name: Not on file  . Number of children: Not on file  . Years of education: Not on file  . Highest education level: Not on file  Occupational History  . Not on file  Social Needs  . Financial resource strain: Not on file  . Food insecurity:    Worry: Not on file    Inability: Not on file  . Transportation needs:    Medical: Not on file     Non-medical: Not on file  Tobacco Use  . Smoking status: Former Smoker    Packs/day: 0.50    Years: 20.00    Pack years: 10.00    Types: Cigarettes    Last attempt to quit: 05/27/2008    Years since quitting: 10.2  . Smokeless tobacco: Never Used  Substance and Sexual Activity  . Alcohol use: Yes    Comment: occ  . Drug use: No  . Sexual activity: Not on file  Lifestyle  . Physical activity:    Days per week: Not on file    Minutes per session: Not on file  . Stress: Not on file  Relationships  . Social connections:    Talks on phone: Not on file    Gets together: Not on file    Attends religious service: Not on file    Active member of club or organization: Not on file    Attends meetings of clubs or organizations: Not on file    Relationship status: Not on file  Other Topics Concern  . Not on file  Social History Narrative  . Not on file   Allergies  Allergen Reactions  . Doxycycline     Gi upset  . Penicillins     REACTION: rash, aggression Has patient had a PCN  reaction causing immediate rash, facial/tongue/throat swelling, SOB or lightheadedness with hypotension: YES Has patient had a PCN reaction causing severe rash involving mucus membranes or skin necrosis: NO Has patient had a PCN reaction that required hospitalizationNO Has patient had a PCN reaction occurring within the last 10 years:NO If all of the above answers are "NO", then may proceed with Cephalosporin use.   . Prednisone     REACTION: agressive behavior   Family History  Problem Relation Age of Onset  . Arthritis Other   . Cancer Other        lung  . Coronary artery disease Other   . Stroke Other   . Colon cancer Neg Hx      Current Outpatient Medications (Cardiovascular):  .  furosemide (LASIX) 20 MG tablet, TAKE 1 TABLET BY MOUTH EVERY DAY AS NEEDED  Current Outpatient Medications (Respiratory):  .  benzonatate (TESSALON PERLES) 100 MG capsule, Take 1 capsule (100 mg total) by mouth  3 (three) times daily as needed. .  loratadine (CLARITIN) 10 MG tablet, Take 10 mg by mouth daily.  Current Outpatient Medications (Analgesics):  .  colchicine 0.6 MG tablet, Take 1 tablet (0.6 mg total) by mouth 2 (two) times daily as needed. For gout flareups.   Current Outpatient Medications (Other):  [name]  ALPRAZolam (XANAX) 1 MG tablet, Take 1 mg by mouth 3 (three) times daily as needed. For anxiety. .  D3-50 50000 units capsule, Take 50,000 Units by mouth once a week. .  desvenlafaxine (PRISTIQ) 100 MG 24 hr tablet, Take 100 mg by mouth every morning. .  ondansetron (ZOFRAN ODT) 4 MG disintegrating tablet, Take 1 tablet (4 mg total) by mouth every 8 (eight) hours as needed for nausea or vomiting. .  traZODone (DESYREL) 100 MG tablet, Take 300 mg by mouth at bedtime.  .  triamcinolone ointment (KENALOG) 0.5 %, Apply 1 application topically 2 (two) times daily. (Patient taking differently: Apply 1 application topically as needed. )    Past medical history, social, surgical and family history all reviewed in electronic medical record.  No pertanent information unless stated regarding to the chief complaint.   Review of Systems:  No visual changes, nausea, vomiting, diarrhea, constipation, dizziness, abdominal pain, skin rash, fevers, chills, night sweats, weight loss, swollen lymph nodes, body aches, joint swelling, muscle aches, chest pain, shortness of breath, mood changes.  Positive muscle aches  Objective  Blood pressure 118/86, pulse 81, height 5' 6.5" (1.689 m), weight 248 lb (112.5 kg), SpO2 97 %.   General: No apparent distress alert and oriented x3 mood and affect normal, dressed appropriately.  HEENT: Pupils equal, extraocular movements intact  Respiratory: Patient's speak in full sentences and does not appear short of breath  Cardiovascular: No lower extremity edema, non tender, no erythema  Skin: Warm dry intact with no signs of infection or rash on extremities or on axial  skeleton.  Abdomen: Soft nontender  Neuro: Cranial nerves II through XII are intact, neurovascularly intact in all extremities with 2+ DTRs and 2+ pulses.  Lymph: No lymphadenopathy of posterior or anterior cervical chain or axillae bilaterally.  Gait mild antalgic MSK:  Non tender with full range of motion and good stability and symmetric strength and tone of shoulders, elbows, wrist, hip, knee and ankles bilaterally.  Patient has been doing fairly well overall.  Patient did well with serial sevens, good recall.  Seems to be appropriate.   Impression and Recommendations:      The above  documentation has been reviewed and is accurate and complete Emily Pulley, DO       Note: This dictation was prepared with Dragon dictation along with smaller phrase technology. Any transcriptional errors that result from this process are unintentional.

## 2018-10-01 ENCOUNTER — Ambulatory Visit (INDEPENDENT_AMBULATORY_CARE_PROVIDER_SITE_OTHER): Payer: PPO | Admitting: Family Medicine

## 2018-10-01 ENCOUNTER — Other Ambulatory Visit: Payer: Self-pay

## 2018-10-01 DIAGNOSIS — J019 Acute sinusitis, unspecified: Secondary | ICD-10-CM

## 2018-10-01 MED ORDER — AZITHROMYCIN 250 MG PO TABS: ORAL_TABLET | ORAL | 0 refills | Status: AC

## 2018-10-01 NOTE — Progress Notes (Signed)
Patient ID: [name], female   DOB: 1950-07-12, 68 y.o.   MRN: [id number]  Virtual Visit via Video Note  I connected with Emily [name] on 10/01/18 at 11:45 AM EDT by a video enabled telemedicine application and verified that I am speaking with the correct person using two identifiers.  Location patient: home Location provider:work or home office Persons participating in the virtual visit: patient, provider  I discussed the limitations of evaluation and management by telemedicine and the availability of in person appointments. The patient expressed understanding and agreed to proceed.   HPI: Patient called in with about 3-week history of some pain and pressure behind her frontal sinus and maxillary sinus region bilaterally.  She is had some facial pain.  Occasional headaches.  No fever.  Rare dry cough.  No dyspnea.  No increased postnasal drainage.  General malaise.  She states the symptoms are exactly similar to what she has had with acute sinusitis in the past.  She works part-time with a [employer] home.  She is in contact with public.  Again, no fever and no dyspnea.  Cough is a small component of her symptom   ROS: See pertinent positives and negatives per HPI.  Past Medical History:  Diagnosis Date  . Allergy    seasonal   . Anxiety   . Cancer ([place]) 2009   squamous cell skin  . Cataract    bilateral surgery, left unsuccessful  . Depression   . Eating disorder   . GERD (gastroesophageal reflux disease)   . Glaucoma   . Hiatal hernia   . History of cardiovascular stress test 04/2000   cardiolyte  . History of flexible sigmoidoscopy 09/21/1999    Past Surgical History:  Procedure Laterality Date  . ABDOMINAL HYSTERECTOMY    . APPENDECTOMY    . CARDIAC CATHETERIZATION  04/2000  . CARDIAC CATHETERIZATION    . COLONOSCOPY  12/31/09  . UPPER GASTROINTESTINAL ENDOSCOPY  09/15/1999    Family History  Problem Relation Age of Onset  . Arthritis Other   . Cancer Other        lung  . Coronary artery disease Other   . Stroke Other   . Colon cancer Neg Hx     SOCIAL HX: Non-smoker   Current Outpatient Medications:  .  ALPRAZolam (XANAX) 1 MG tablet, Take 1 mg by mouth 3 (three) times daily as needed. For anxiety., Disp: , Rfl:  .  azithromycin (ZITHROMAX Z-PAK) 250 MG tablet, Take 2 tablets (500 mg) on  Day 1,  followed by 1 tablet (250 mg) once daily on Days 2 through 5., Disp: 6 each, Rfl: 0 .  colchicine 0.6 MG tablet, Take 1 tablet (0.6 mg total) by mouth 2 (two) times daily as needed. For gout flareups., Disp: 60 tablet, Rfl: 3 .  D3-50 50000 units capsule, Take 50,000 Units by mouth once a week., Disp: , Rfl: 12 .  desvenlafaxine (PRISTIQ) 100 MG 24 hr tablet, Take 100 mg by mouth every morning., Disp: , Rfl: 12 .  furosemide (LASIX) 20 MG tablet, TAKE 1 TABLET BY MOUTH EVERY DAY AS NEEDED, Disp: 30 tablet, Rfl: 1 .  loratadine (CLARITIN) 10 MG tablet, Take 10 mg by mouth daily., Disp: , Rfl:  .  ondansetron (ZOFRAN ODT) 4 MG disintegrating tablet, Take 1 tablet (4 mg total) by mouth every 8 (eight) hours as needed for nausea or vomiting., Disp: 15 tablet, Rfl: 0 .  traZODone (DESYREL) 100 MG tablet, Take 300 mg  by mouth at bedtime. , Disp: , Rfl:  .  triamcinolone ointment (KENALOG) 0.5 %, Apply 1 application topically 2 (two) times daily. (Patient taking differently: Apply 1 application topically as needed. ), Disp: 30 g, Rfl: 0  EXAM:  VITALS per patient if applicable:  GENERAL: alert, oriented, appears well and in no acute distress  HEENT: atraumatic, conjunttiva clear, no obvious abnormalities on inspection of external nose and ears  NECK: normal movements of the head and neck  LUNGS: on inspection no signs of respiratory distress, breathing rate appears normal, no obvious gross SOB, gasping or wheezing  CV: no obvious cyanosis  MS: moves all visible extremities without noticeable abnormality  PSYCH/NEURO: pleasant and cooperative, no  obvious depression or anxiety, speech and thought processing grossly intact  ASSESSMENT AND PLAN:  Discussed the following assessment and plan:  Acute sinusitis, recurrence not specified, unspecified location  -Start Zithromax and treat for 5 days.  She has had previous intolerance with doxycycline and penicillin -Any of fluids and rest -Follow-up for any persistent or worsening symptoms     I discussed the assessment and treatment plan with the patient. The patient was provided an opportunity to ask questions and all were answered. The patient agreed with the plan and demonstrated an understanding of the instructions.   The patient was advised to call back or seek an in-person evaluation if the symptoms worsen or if the condition fails to improve as anticipated   Carolann Littler, MD

## 2018-11-13 ENCOUNTER — Ambulatory Visit: Payer: Self-pay | Admitting: *Deleted

## 2018-11-13 ENCOUNTER — Other Ambulatory Visit: Payer: Self-pay

## 2018-11-13 ENCOUNTER — Ambulatory Visit: Payer: PPO | Admitting: Family Medicine

## 2018-11-13 ENCOUNTER — Ambulatory Visit (INDEPENDENT_AMBULATORY_CARE_PROVIDER_SITE_OTHER): Payer: PPO | Admitting: Family Medicine

## 2018-11-13 DIAGNOSIS — R1011 Right upper quadrant pain: Secondary | ICD-10-CM

## 2018-11-13 NOTE — Telephone Encounter (Signed)
Pt scheduled for virtual visit today.  

## 2018-11-13 NOTE — Progress Notes (Signed)
Patient ID: [name], female   DOB: [birth year], 68 y.o.   MRN: [id number]  This visit type was conducted due to national recommendations for restrictions regarding the COVID-19 pandemic in an effort to limit this patient's exposure and mitigate transmission in our community.   .................Marland KitchenVirtual Visit via Telephone Note  I connected with Emily [name]  on 11/13/18 at  1:15 PM EDT by telephone and verified that I am speaking with the correct person using two identifiers.   I discussed the limitations, risks, security and privacy concerns of performing an evaluation and management service by telephone and the availability of in person appointments. I also discussed with the patient that there may be a patient responsible charge related to this service. The patient expressed understanding and agreed to proceed.  Location patient: home Location provider: work or home office Participants present for the call: patient, provider Patient did not have a visit in the prior 7 days to address this/these issue(s).   History of Present Illness:  Patient called regarding some intermittent right upper quadrant abdominal pain.  Somewhat achy pain up under her rib cage area.  She has had somewhat similar pain in the past.  4 years ago she had ultrasound of the abdomen along with nuclear medicine hepatobiliary scan which were both normal.  She does have history of hepatic steatosis.  Occasional associated back pain.  Pain is moderate.  She has had some symptoms above for about 6 months but especially increasing past couple of weeks.  She has had some mild shortness of breath.  Denies chest pain.  Only rare cough.  No fever.  She has had some intermittent nausea.  Symptoms seem to be worse after eating.  She has felt somewhat bloated and increased gas at times.  No definite fever.  No stool changes.  No melena.  No emesis. She states her appetite is slightly diminished but her weight is  unchanged  Past Medical History:  Diagnosis Date  . Allergy    seasonal   . Anxiety   . Cancer ([place]) 2009   squamous cell skin  . Cataract    bilateral surgery, left unsuccessful  . Depression   . Eating disorder   . GERD (gastroesophageal reflux disease)   . Glaucoma   . Hiatal hernia   . History of cardiovascular stress test 04/2000   cardiolyte  . History of flexible sigmoidoscopy 09/21/1999   Past Surgical History:  Procedure Laterality Date  . ABDOMINAL HYSTERECTOMY    . APPENDECTOMY    . CARDIAC CATHETERIZATION  04/2000  . CARDIAC CATHETERIZATION    . COLONOSCOPY  12/31/09  . UPPER GASTROINTESTINAL ENDOSCOPY  09/15/1999    reports that she quit smoking about 10 years ago. Her smoking use included cigarettes. She has a 10.00 pack-year smoking history. She has never used smokeless tobacco. She reports current alcohol use. She reports that she does not use drugs. family history includes Arthritis in an other family member; Cancer in an other family member; Coronary artery disease in an other family member; Stroke in an other family member. Allergies  Allergen Reactions  . Doxycycline     Gi upset  . Penicillins     REACTION: rash, aggression Has patient had a PCN reaction causing immediate rash, facial/tongue/throat swelling, SOB or lightheadedness with hypotension: YES Has patient had a PCN reaction causing severe rash involving mucus membranes or skin necrosis: NO Has patient had a PCN reaction that required hospitalizationNO Has patient had a PCN  reaction occurring within the last 10 years:NO If all of the above answers are "NO", then may proceed with Cephalosporin use.   . Prednisone     REACTION: agressive behavior      Observations/Objective: Patient sounds cheerful and well on the phone. I do not appreciate any SOB. Speech and thought processing are grossly intact. Patient reported vitals:  Assessment and Plan:  Several month history of progressive  abdominal pain right upper quadrant.  Previous work-up for gallstones or gallbladder issue negative though this was over 4 years ago  -Patient will come in tomorrow for labs including CBC, comprehensive metabolic panel, lipase -Set up abdominal ultrasound to further assess  Follow Up Instructions:  -As above   99441 5-10 99442 11-20 99443 21-30 I did not refer this patient for an OV in the next 24 hours for this/these issue(s).  I discussed the assessment and treatment plan with the patient. The patient was provided an opportunity to ask questions and all were answered. The patient agreed with the plan and demonstrated an understanding of the instructions.   The patient was advised to call back or seek an in-person evaluation if the symptoms worsen or if the condition fails to improve as anticipated.  I provided 23 minutes of non-face-to-face time during this encounter.   Carolann Littler, MD

## 2018-11-13 NOTE — Telephone Encounter (Signed)
Pt reports "Gall bladder trouble for years" States worsening past 2 weeks with right sided abdominal pain, radiates at times "Across upper abdomen."  States pain varies in intensity, 10/10 when "bending over," 7-8/10 when occurs other times; intermittent. "Feels like I can't digest food." Reports mild nausea, no vomiting, no diarrhea. Also reports mild SOB, mild CP and cough last night. States "I take my med for panic attacks and that seems to help with the SOB and CP." Also states "Felt hot last night." Unsure if febrile. Call transferred to practice, [facility], for consideration of appt. Pt's email and PH# verified. Care advise given.  Reason for Disposition . Age > 60 years  Answer Assessment - Initial Assessment Questions 1. LOCATION: "Where does it hurt?"     Right side abdomen under rib cage 2. RADIATION: "Does the pain shoot anywhere else?" (e.g., chest, back)     Upper abdomen 3. ONSET: "When did the pain begin?" (e.g., minutes, hours or days ago)     Years, worsened past 2 weeks 4. SUDDEN: "Gradual or sudden onset?"     graudal 5. PATTERN "Does the pain come and go, or is it constant?"    - If constant: "Is it getting better, staying the same, or worsening?"      (Note: Constant means the pain never goes away completely; most serious pain is constant and it progresses)     - If intermittent: "How long does it last?" "Do you have pain now?"     (Note: Intermittent means the pain goes away completely between bouts)     Intermittent 6. SEVERITY: "How bad is the pain?"  (e.g., Scale 1-10; mild, moderate, or severe)   - MILD (1-3): doesn't interfere with normal activities, abdomen soft and not tender to touch    - MODERATE (4-7): interferes with normal activities or awakens from sleep, tender to touch    - SEVERE (8-10): excruciating pain, doubled over, unable to do any normal activities      Varies 10/10 when bending over, 7-8/10 when occurs other times. 7. RECURRENT SYMPTOM: "Have you  ever had this type of abdominal pain before?" If so, ask: "When was the last time?" and "What happened that time?"      *No Answer* 8. CAUSE: "What do you think is causing the abdominal pain?"     Gall Bladder 9. RELIEVING/AGGRAVATING FACTORS: "What makes it better or worse?" (e.g., movement, antacids, bowel movement)     *No Answer* 10. OTHER SYMPTOMS: "Has there been any vomiting, diarrhea, constipation, or urine problems?"      Mild SOB, chest pain, mild.  Takes "Nerve pill, helps." Cough, states productive, unsure what looks like  Protocols used: ABDOMINAL PAIN - [facility]

## 2018-11-14 ENCOUNTER — Other Ambulatory Visit (INDEPENDENT_AMBULATORY_CARE_PROVIDER_SITE_OTHER): Payer: PPO

## 2018-11-14 ENCOUNTER — Other Ambulatory Visit: Payer: Self-pay

## 2018-11-14 DIAGNOSIS — R1011 Right upper quadrant pain: Secondary | ICD-10-CM | POA: Diagnosis not present

## 2018-11-14 LAB — CBC WITH DIFFERENTIAL/PLATELET
Basophils Absolute: 0 10*3/uL (ref 0.0–0.1)
Basophils Relative: 0.4 % (ref 0.0–3.0)
Eosinophils Absolute: 0.1 10*3/uL (ref 0.0–0.7)
Eosinophils Relative: 2.5 % (ref 0.0–5.0)
HCT: 43.2 % (ref 36.0–46.0)
Hemoglobin: 15.2 g/dL — ABNORMAL HIGH (ref 12.0–15.0)
Lymphocytes Relative: 23.3 % (ref 12.0–46.0)
Lymphs Abs: 1.2 10*3/uL (ref 0.7–4.0)
MCHC: 35.1 g/dL (ref 30.0–36.0)
MCV: 90.8 fl (ref 78.0–100.0)
Monocytes Absolute: 0.5 10*3/uL (ref 0.1–1.0)
Monocytes Relative: 10 % (ref 3.0–12.0)
Neutro Abs: 3.3 10*3/uL (ref 1.4–7.7)
Neutrophils Relative %: 63.8 % (ref 43.0–77.0)
Platelets: 237 10*3/uL (ref 150.0–400.0)
RBC: 4.75 Mil/uL (ref 3.87–5.11)
RDW: 13.1 % (ref 11.5–15.5)
WBC: 5.2 10*3/uL (ref 4.0–10.5)

## 2018-11-14 LAB — COMPREHENSIVE METABOLIC PANEL
ALT: 33 U/L (ref 0–35)
AST: 22 U/L (ref 0–37)
Albumin: 4.1 g/dL (ref 3.5–5.2)
Alkaline Phosphatase: 54 U/L (ref 39–117)
BUN: 16 mg/dL (ref 6–23)
CO2: 30 mEq/L (ref 19–32)
Calcium: 9.6 mg/dL (ref 8.4–10.5)
Chloride: 102 mEq/L (ref 96–112)
Creatinine, Ser: 0.87 mg/dL (ref 0.40–1.20)
GFR: 64.64 mL/min (ref >60.00)
Glucose, Bld: 96 mg/dL (ref 70–99)
Potassium: 4.5 mEq/L (ref 3.5–5.1)
Sodium: 140 mEq/L (ref 135–145)
Total Bilirubin: 0.4 mg/dL (ref 0.2–1.2)
Total Protein: 6.7 g/dL (ref 6.0–8.3)

## 2018-11-14 LAB — LIPASE: Lipase: 8 U/L — ABNORMAL LOW (ref 11.0–59.0)

## 2018-11-21 ENCOUNTER — Other Ambulatory Visit: Payer: Self-pay | Admitting: Family Medicine

## 2018-11-21 DIAGNOSIS — Z1231 Encounter for screening mammogram for malignant neoplasm of breast: Secondary | ICD-10-CM

## 2018-12-03 ENCOUNTER — Other Ambulatory Visit: Payer: PPO

## 2018-12-19 ENCOUNTER — Ambulatory Visit
Admission: RE | Admit: 2018-12-19 | Discharge: 2018-12-19 | Disposition: A | Discharge status: Home / Self Care | Payer: PPO | Source: Ambulatory Visit | Attending: Family Medicine | Admitting: Family Medicine

## 2018-12-19 DIAGNOSIS — K760 Fatty (change of) liver, not elsewhere classified: Secondary | ICD-10-CM | POA: Diagnosis not present

## 2018-12-19 DIAGNOSIS — R1011 Right upper quadrant pain: Secondary | ICD-10-CM

## 2018-12-27 ENCOUNTER — Telehealth: Payer: Self-pay

## 2018-12-27 NOTE — Telephone Encounter (Signed)
Copied from [place] [id number]. Topic: General - Other >> Dec 27, 2018  9:08 AM Emily [name] A wrote: Reason for CRM: Patient called to request a call back for the result of the US Abdomen Complete she had on 12/19/2018. Please call Ph# [phone]

## 2018-12-30 ENCOUNTER — Telehealth: Payer: Self-pay | Admitting: Family Medicine

## 2018-12-30 NOTE — Telephone Encounter (Signed)
Called patient and she is really concerned about the problems are coming from her gallbladder. Patient stated that the pain is getting more severe and the pain increases anytime she eats food, she has had some indigestion and states there is a terrible taste in her mouth, when she bends over on her right side the pain is so bad it nearly takes her breath. Patient stated that her father had gallbladder issues and they did not find out until they went to remove it and his was very inflamed and had to come out. Patient stated that she is miserable and needs help and not sure if she needs a specialist? Patient also asked for help with her diet and how to help her loose some weight. She has a Doxy visit tomorrow, 12/31/18 at 1:30pm.   Result note sent to Dr. Elease Hashimoto.

## 2018-12-30 NOTE — Telephone Encounter (Signed)
Pt want to know results of her [place] scan. Please advise.

## 2018-12-30 NOTE — Telephone Encounter (Signed)
Patient has been called and [place] results discussed and has a follow up with Dr. Elease Hashimoto on 12/31/18.

## 2018-12-31 ENCOUNTER — Ambulatory Visit (INDEPENDENT_AMBULATORY_CARE_PROVIDER_SITE_OTHER): Payer: PPO | Admitting: Family Medicine

## 2018-12-31 ENCOUNTER — Other Ambulatory Visit: Payer: Self-pay

## 2018-12-31 DIAGNOSIS — K760 Fatty (change of) liver, not elsewhere classified: Secondary | ICD-10-CM

## 2018-12-31 DIAGNOSIS — R1011 Right upper quadrant pain: Secondary | ICD-10-CM | POA: Diagnosis not present

## 2018-12-31 NOTE — Progress Notes (Signed)
Patient ID: Emily [name], female   DOB: 1950/09/12, 68 y.o.   MRN: [id number]   This visit type was conducted due to national recommendations for restrictions regarding the COVID-19 pandemic in an effort to limit this patient's exposure and mitigate transmission in our community.   Virtual Visit via Telephone Note  I connected with Emily [name] on 12/31/18 at  1:30 PM EDT by telephone and verified that I am speaking with the correct person using two identifiers.   I discussed the limitations, risks, security and privacy concerns of performing an evaluation and management service by telephone and the availability of in person appointments. I also discussed with the patient that there may be a patient responsible charge related to this service. The patient expressed understanding and agreed to proceed.  Location patient: home Location provider: work or home office Participants present for the call: patient, provider Patient did not have a visit in the prior 7 days to address this/these issue(s).   History of Present Illness: Patient was seen recently with intermittent right upper quadrant pain.  We obtain follow-up ultrasound which showed no gallstones or gallbladder wall thickening.  She did have fatty liver changes consistent with prior exams.  Her pain is unchanged.  Observe be worse after fatty foods which she is trying to avoid.  She is on any fever, vomiting, diarrhea.  Patient also had gallbladder nuclear scan back in 2016 which showed normal ejection.  She has not had any recent melena.  No significant appetite or weight changes.   Observations/Objective: Patient sounds cheerful and well on the phone. I do not appreciate any SOB. Speech and thought processing are grossly intact. Patient reported vitals:  Assessment and Plan: #1 recurrent somewhat chronic intermittent right upper quadrant abdominal pain with recent abdominal ultrasound unrevealing of cause  -We discussed that we  could get follow-up nuclear scan but have decided instead to go ahead with GI referral to get their opinion  #2 fatty liver -Discussed significance of fatty liver including potential for developing eventually nonalcoholic cirrhosis -Strongly advocate losing some weight and reducing saturated fats.  We recommend she consider structured weight loss program such as weight watchers  Follow Up Instructions:  -As above   99441 5-10 99442 11-20 99443 21-30 I did not refer this patient for an OV in the next 24 hours for this/these issue(s).  I discussed the assessment and treatment plan with the patient. The patient was provided an opportunity to ask questions and all were answered. The patient agreed with the plan and demonstrated an understanding of the instructions.   The patient was advised to call back or seek an in-person evaluation if the symptoms worsen or if the condition fails to improve as anticipated.  I provided 23 minutes of non-face-to-face time during this encounter.   Carolann Littler, MD

## 2019-01-08 ENCOUNTER — Ambulatory Visit
Admission: RE | Admit: 2019-01-08 | Discharge: 2019-01-08 | Disposition: A | Discharge status: Home / Self Care | Payer: PPO | Source: Ambulatory Visit | Attending: Family Medicine | Admitting: Family Medicine

## 2019-01-08 ENCOUNTER — Other Ambulatory Visit: Payer: Self-pay

## 2019-01-08 DIAGNOSIS — Z1231 Encounter for screening mammogram for malignant neoplasm of breast: Secondary | ICD-10-CM | POA: Diagnosis not present

## 2019-02-02 ENCOUNTER — Encounter (HOSPITAL_COMMUNITY): Payer: Self-pay | Admitting: Emergency Medicine

## 2019-02-02 ENCOUNTER — Other Ambulatory Visit: Payer: Self-pay

## 2019-02-02 ENCOUNTER — Ambulatory Visit (HOSPITAL_COMMUNITY)
Admission: EM | Admit: 2019-02-02 | Discharge: 2019-02-02 | Disposition: A | Discharge status: Home / Self Care | Payer: PPO | Attending: Emergency Medicine | Admitting: Emergency Medicine

## 2019-02-02 DIAGNOSIS — Z20828 Contact with and (suspected) exposure to other viral communicable diseases: Secondary | ICD-10-CM | POA: Diagnosis not present

## 2019-02-02 DIAGNOSIS — Z20822 Contact with and (suspected) exposure to covid-19: Secondary | ICD-10-CM

## 2019-02-02 NOTE — ED Provider Notes (Signed)
[facility]    CSN: [id number] Arrival date & time: 02/02/19  1352     History   Chief Complaint Chief Complaint  Patient presents with  . Labs Only    HPI Emily [name] is a 68 y.o. female.   Patient presents with request for COVID test today.  She states her daughter received positive COVID test results today and she had visited 6 days ago.  Patient states she is asymptomatic; she denies fever, chills, congestion, cough, shortness of breath, abdominal pain, vomiting, diarrhea, or other symptoms.      The history is provided by the patient.    Past Medical History:  Diagnosis Date  . Allergy    seasonal   . Anxiety   . Cancer ([place]) 2009   squamous cell skin  . Cataract    bilateral surgery, left unsuccessful  . Depression   . Eating disorder   . GERD (gastroesophageal reflux disease)   . Glaucoma   . Hiatal hernia   . History of cardiovascular stress test 04/2000   cardiolyte  . History of flexible sigmoidoscopy 09/21/1999    Patient Active Problem List   Diagnosis Date Noted  . Head injury 07/23/2018  . Hepatic steatosis 10/23/2016  . Ankle sprain 05/18/2011  . Chest wall soft tissue injury 05/18/2011  . Sprain of foot, left 05/18/2011  . VIRAL URI 12/01/2009  . ARTHRALGIA 11/10/2009  . WEIGHT GAIN 11/10/2009  . DYSPNEA 11/10/2009  . INGROWN NAIL 05/19/2009  . DEPRESSION 09/07/2008  . FOOT PAIN, BILATERAL 09/07/2008    Past Surgical History:  Procedure Laterality Date  . ABDOMINAL HYSTERECTOMY    . APPENDECTOMY    . CARDIAC CATHETERIZATION  04/2000  . CARDIAC CATHETERIZATION    . COLONOSCOPY  12/31/09  . UPPER GASTROINTESTINAL ENDOSCOPY  09/15/1999    OB History   No obstetric history on file.      Home Medications    Prior to Admission medications   Medication Sig Start Date End Date Taking? Authorizing Provider  ALPRAZolam Duanne Moron) 1 MG tablet Take 1 mg by mouth 3 (three) times daily as needed. For anxiety.    [provider]  colchicine 0.6 MG tablet Take 1 tablet (0.6 mg total) by mouth 2 (two) times daily as needed. For gout flareups. 01/21/16   Burchette, Alinda Sierras, MD  D3-50 50000 units capsule Take 50,000 Units by mouth once a week. 02/02/18   [provider]  desvenlafaxine (PRISTIQ) 100 MG 24 hr tablet Take 100 mg by mouth every morning. 01/02/18   [provider]  furosemide (LASIX) 20 MG tablet TAKE 1 TABLET BY MOUTH EVERY DAY AS NEEDED 06/11/18   Burchette, Alinda Sierras, MD  loratadine (CLARITIN) 10 MG tablet Take 10 mg by mouth daily.    [provider]  ondansetron (ZOFRAN ODT) 4 MG disintegrating tablet Take 1 tablet (4 mg total) by mouth every 8 (eight) hours as needed for nausea or vomiting. 07/17/18   Blanchie Dessert, MD  traZODone (DESYREL) 100 MG tablet Take 300 mg by mouth at bedtime.     [provider]  triamcinolone ointment (KENALOG) 0.5 % Apply 1 application topically 2 (two) times daily. Patient taking differently: Apply 1 application topically as needed.  05/22/17   Dorothyann Peng, NP    Family History Family History  Problem Relation Age of Onset  . Arthritis Other   . Cancer Other        lung  . Coronary artery  disease Other   . Stroke Other   . Colon cancer Neg Hx     Social History Social History   Tobacco Use  . Smoking status: Former Smoker    Packs/day: 0.50    Years: 20.00    Pack years: 10.00    Types: Cigarettes    Quit date: 05/27/2008    Years since quitting: 10.6  . Smokeless tobacco: Never Used  Substance Use Topics  . Alcohol use: Yes    Comment: occ  . Drug use: No     Allergies   Doxycycline, Penicillins, and Prednisone   Review of Systems Review of Systems  Constitutional: Negative for chills and fever.  HENT: Negative for congestion, ear pain, rhinorrhea and sore throat.   Eyes: Negative for pain and visual disturbance.  Respiratory: Negative for cough and shortness of breath.   Cardiovascular: Negative  for chest pain and palpitations.  Gastrointestinal: Negative for abdominal pain, diarrhea and vomiting.  Genitourinary: Negative for dysuria and hematuria.  Musculoskeletal: Negative for arthralgias and back pain.  Skin: Negative for color change and rash.  Neurological: Negative for seizures and syncope.  All other systems reviewed and are negative.    Physical Exam Triage Vital Signs ED Triage Vitals  Enc Vitals Group     BP 02/02/19 1526 (!) 169/110     Pulse Rate 02/02/19 1526 68     Resp 02/02/19 1526 20     Temp 02/02/19 1526 98 F (36.7 C)     Temp Source 02/02/19 1526 Temporal     SpO2 02/02/19 1526 95 %     Weight --      Height --      Head Circumference --      Peak Flow --      Pain Score 02/02/19 1521 0     Pain Loc --      Pain Edu? --      Excl. in [place]? --    No data found.  Updated Vital Signs BP (!) 169/110 (BP Location: Right Arm)   Pulse 68   Temp 98 F (36.7 C) (Temporal)   Resp 20   SpO2 95%   Visual Acuity Right Eye Distance:   Left Eye Distance:   Bilateral Distance:    Right Eye Near:   Left Eye Near:    Bilateral Near:     Physical Exam Vitals signs and nursing note reviewed.  Constitutional:      General: She is not in acute distress.    Appearance: She is well-developed.  HENT:     Head: Normocephalic and atraumatic.     Right Ear: Tympanic membrane normal.     Left Ear: Tympanic membrane normal.     Nose: Nose normal.     Mouth/Throat:     Mouth: Mucous membranes are moist.     Pharynx: Oropharynx is clear.  Eyes:     Conjunctiva/sclera: Conjunctivae normal.  Neck:     Musculoskeletal: Neck supple.  Cardiovascular:     Rate and Rhythm: Normal rate and regular rhythm.     Heart sounds: No murmur.  Pulmonary:     Effort: Pulmonary effort is normal. No respiratory distress.     Breath sounds: Normal breath sounds.  Abdominal:     Palpations: Abdomen is soft.     Tenderness: There is no abdominal tenderness. There is no  guarding or rebound.  Skin:    General: Skin is warm and dry.  Findings: No rash.  Neurological:     Mental Status: She is alert.      UC Treatments / Results  Labs (all labs ordered are listed, but only abnormal results are displayed) Labs Reviewed  NOVEL CORONAVIRUS, NAA (HOSPITAL ORDER, SEND-OUT TO REF LAB)    EKG   Radiology No results found.  Procedures Procedures (including critical care time)  Medications Ordered in UC Medications - No data to display  Initial Impression / Assessment and Plan / UC Course  I have reviewed the triage vital signs and the nursing notes.  Pertinent labs & imaging results that were available during my care of the patient were reviewed by me and considered in my medical decision making (see chart for details).   Exposure to COVID.  Asymptomatic.  Well-appearing.  COVID test performed here.  Instructed patient to self quarantine until the test result is back and negative.  Discussed with patient that she should go to the emergency department if she develops shortness of breath, high fever, severe diarrhea, or other concerning symptoms.     Final Clinical Impressions(s) / UC Diagnoses   Final diagnoses:  Exposure to Covid-19 Virus     Discharge Instructions     Your COVID test is pending.  You should self quarantine until your test result is back and is negative.    Go to the emergency department if you develop shortness of breath, high fever, severe diarrhea, or other concerning symptoms.        ED Prescriptions    None     Controlled Substance Prescriptions [place] Controlled Substance Registry consulted? Not Applicable   Sharion Balloon, NP 02/02/19 1554

## 2019-02-02 NOTE — Discharge Instructions (Addendum)
Your COVID test is pending.  You should self quarantine until your test result is back and is negative.   ° °Go to the emergency department if you develop shortness of breath, high fever, severe diarrhea, or other concerning symptoms.   ° ° ° °

## 2019-02-02 NOTE — ED Triage Notes (Signed)
daughter (does not live with patient) tested positive for covid, received results today.  Instructed patient to be tested.  Patient denies any symptoms  Patient has had panic attacks recently and slightly sob  Thought to be related to anxiety

## 2019-02-03 LAB — NOVEL CORONAVIRUS, NAA (HOSP ORDER, SEND-OUT TO REF LAB; TAT 18-24 HRS): SARS-CoV-2, NAA: NOT DETECTED

## 2019-02-28 ENCOUNTER — Other Ambulatory Visit: Payer: PPO

## 2019-02-28 ENCOUNTER — Encounter: Payer: Self-pay | Admitting: Gastroenterology

## 2019-02-28 ENCOUNTER — Other Ambulatory Visit: Payer: Self-pay

## 2019-02-28 ENCOUNTER — Ambulatory Visit (INDEPENDENT_AMBULATORY_CARE_PROVIDER_SITE_OTHER): Payer: PPO | Admitting: Gastroenterology

## 2019-02-28 VITALS — BP 128/92 | HR 84 | Temp 98.0°F | Ht 66.0 in | Wt 251.5 lb

## 2019-02-28 DIAGNOSIS — R1011 Right upper quadrant pain: Secondary | ICD-10-CM

## 2019-02-28 MED ORDER — OMEPRAZOLE 40 MG PO CPDR: 40.0000 mg | DELAYED_RELEASE_CAPSULE | Freq: Every day | ORAL | 5 refills | Status: DC

## 2019-02-28 NOTE — Patient Instructions (Signed)
You have been scheduled for a CT scan of the abdomen and pelvis at [facility] ([address]---this is in the same building as [facility]).   You are scheduled on 03/11/19 at [facility] should arrive 15 minutes prior to your appointment time for registration. Please follow the written instructions below on the day of your exam:  WARNING: IF YOU ARE ALLERGIC TO IODINE/X-RAY DYE, PLEASE NOTIFY RADIOLOGY IMMEDIATELY AT [phone]! YOU WILL BE GIVEN A 13 HOUR PREMEDICATION PREP.  1) Do not eat or drink anything after 7am (4 hours prior to your test) 2) You have been given 2 bottles of oral contrast to drink. The solution may taste better if refrigerated, but do NOT add ice or any other liquid to this solution. Shake well before drinking.    Drink 1 bottle of contrast @ 9am (2 hours prior to your exam)  Drink 1 bottle of contrast @ 10am (1 hour prior to your exam)  You may take any medications as prescribed with a small amount of water, if necessary. If you take any of the following medications: METFORMIN, GLUCOPHAGE, GLUCOVANCE, AVANDAMET, RIOMET, FORTAMET, [place] MET, JANUMET, GLUMETZA or METAGLIP, you MAY be asked to HOLD this medication 48 hours AFTER the exam.  The purpose of you drinking the oral contrast is to aid in the visualization of your intestinal tract. The contrast solution may cause some diarrhea. Depending on your individual set of symptoms, you may also receive an intravenous injection of x-ray contrast/dye. Plan on being at [facility] for 30 minutes or longer, depending on the type of exam you are having performed.  This test typically takes 30-45 minutes to complete.  If you have any questions regarding your exam or if you need to reschedule, you may call the CT department at [phone] between the hours of 8:00 am and 5:00 pm, Monday-Friday.  ________________________________________________________________________   We have sent the following  medications to your pharmacy for you to pick up at your convenience: Omeprazole 40 mg  Your provider has requested that you go to the basement level for lab work before leaving today. Press "B" on the elevator. The lab is located at the first door on the left as you exit the elevator.   Thank you for entrusting me with your care and choosing [facility].  Dr Ardis Hughs

## 2019-02-28 NOTE — Progress Notes (Signed)
Review of pertinent gastrointestinal problems: 1.  Personal history of adenomatous colon polyps.  Colonoscopy Dr. Ardis Hughs 2011 found a single subcentimeter tubular adenoma.  Repeat colonoscopy October 2016 for surveillance document diverticulosis but no polyps.  She was recommended to have repeat colonoscopy at 10-year interval.  HPI: This is a  Very pleasant 68 year old woman who was referred to me by Eulas Post, MD  to evaluate right upper quadrant pains.    Chief complaint is intermittent right upper quadrant pain  She has had intermittent right upper quadrant pain for at least 5 years.  It is getting worse.  It is intermittent and not daily.  Occurs at least once weekly.  The pain grabs her.  Eating sometimes brings it on.  It is very positional as well.  Leaning down bending over to the right she will feel something catch in it can even make her short of breath.  She will have to straighten out for that right upper quadrant pain to improved.  It can be quite severe.  It is very positional.  She has no heartburn, no dysphasia.  She takes 200 mg pills of ibuprofen 6 or 8 times per week for a variety of headache pains, orthopedic pains.  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  No acute abnormalities are noted.  Fatty liver noted.  Gallbladder appeared normal.  Blood work May 2020 shows normal CBC, normal lipase and normal complete metabolic profile  Gallbladder HIDA scan 2016 indication "right upper quadrant pain and nausea for the past 2 months" showed normal gallbladder with normal gallbladder ejection fraction    Review of systems: Pertinent positive and negative review of systems were noted in the above HPI section. All other review  negative.   Past Medical History:  Diagnosis Date  . Allergy    seasonal   . Anxiety   . Cancer ([facility]) 2009   squamous cell skin  . Cataract    bilateral surgery, left unsuccessful  . Depression   . Eating disorder   . GERD (gastroesophageal reflux disease)   . Glaucoma   . Hiatal hernia   . History of cardiovascular stress test 04/2000   cardiolyte  . History of flexible sigmoidoscopy 09/21/1999    Past Surgical History:  Procedure Laterality Date  . ABDOMINAL HYSTERECTOMY    . APPENDECTOMY    . CARDIAC CATHETERIZATION  04/2000  . CARDIAC CATHETERIZATION    . COLONOSCOPY  12/31/09  . UPPER GASTROINTESTINAL ENDOSCOPY  09/15/1999    Current Outpatient Medications  Medication Sig Dispense Refill  . ALPRAZolam (XANAX) 1 MG tablet Take 1 mg by mouth 3 (three) times daily as needed. For anxiety.    . colchicine 0.6 MG tablet Take 1 tablet (0.6 mg total) by mouth 2 (two) times daily as needed. For gout flareups. 60 tablet 3  . D3-50 50000 units capsule Take 50,000 Units by mouth once a week.  12  . desvenlafaxine (PRISTIQ) 100 MG 24 hr tablet Take 100 mg by mouth every morning.  12  . furosemide (LASIX) 20 MG tablet TAKE 1 TABLET BY MOUTH EVERY DAY AS NEEDED 30 tablet 1  . latanoprost (XALATAN) 0.005 % ophthalmic solution Place 1 drop into both eyes at  bedtime.    [name] loratadine (CLARITIN) 10 MG tablet Take 10 mg by mouth daily.    . traZODone (DESYREL) 100 MG tablet Take 300 mg by mouth at bedtime.     . triamcinolone ointment (KENALOG) 0.5 % Apply 1 application topically 2 (two) times daily. (Patient taking differently: Apply 1 application topically as needed. ) 30 g 0  . Vitamin D, Ergocalciferol, (DRISDOL) 1.25 MG (50000 UT) CAPS capsule Take 50,000 Units by mouth once a week.    . ondansetron (ZOFRAN ODT) 4 MG disintegrating tablet Take 1 tablet (4 mg total) by mouth every 8 (eight) hours as needed for nausea or vomiting. (Patient not taking: Reported on 02/28/2019) 15 tablet 0    No current facility-administered medications for this visit.     Allergies as of 02/28/2019 - Review Complete 02/28/2019  Allergen Reaction Noted  . Doxycycline  10/23/2016  . Penicillins  09/07/2008  . Prednisone  09/07/2008    Family History  Problem Relation Age of Onset  . Arthritis Other   . Cancer Other        lung  . Coronary artery disease Other   . Stroke Other   . Colon cancer Neg Hx     Social History   Socioeconomic History  . Marital status: Married    Spouse name: Not on file  . Number of children: Not on file  . Years of education: Not on file  . Highest education level: Not on file  Occupational History  . Not on file  Social Needs  . Financial resource strain: Not on file  . Food insecurity    Worry: Not on file    Inability: Not on file  . Transportation needs    Medical: Not on file    Non-medical: Not on file  Tobacco Use  . Smoking status: Former Smoker    Packs/day: 0.50    Years: 20.00    Pack years: 10.00    Types: Cigarettes    Quit date: 05/27/2008    Years since quitting: 10.7  . Smokeless tobacco: Never Used  Substance and Sexual Activity  . Alcohol use: Yes    Comment: occ  . Drug use: No  . Sexual activity: Not on file  Lifestyle  . Physical activity    Days per week: Not on file    Minutes per session: Not on file  . Stress: Not on file  Relationships  . Social Herbalist on phone: Not on file    Gets together: Not on file    Attends religious service: Not on file    Active member of club or organization: Not on file    Attends meetings of clubs or organizations: Not on file    Relationship status: Not on file  . Intimate partner violence    Fear of current or ex partner: Not on file    Emotionally abused: Not on file    Physically abused: Not on file    Forced sexual activity: Not on file  Other Topics Concern  . Not on file  Social History Narrative  . Not on file     Physical Exam: BP (!)  128/92 (BP Location: Left Arm, Patient Position: Sitting, Cuff Size: Large)   Pulse 84   Temp 98 F (36.7 C)   Ht 5\' 6"  (1.676 m) Comment: height measured without shoes  Wt 251 lb 8 oz (114.1 kg)   BMI 40.59 kg/m  Constitutional: generally well-appearing  Psychiatric: alert and oriented x3 Eyes: extraocular movements intact Mouth: oral pharynx moist, no lesions Neck: supple no lymphadenopathy Cardiovascular: heart regular rate and rhythm Lungs: clear to auscultation bilaterally Abdomen: soft, nontender, nondistended, no obvious ascites, no peritoneal signs, normal bowel sounds Extremities: no lower extremity edema bilaterally Skin: no lesions on visible extremities   Assessment and plan: 68 y.o. female with intermittent right upper quadrant pains  I am struck by how positional her right upper quadrant pain is.  This does not seem biliary or gastric in nature.  She certainly does take a decent amount of NSAIDs and to be safe I am putting her on prescription strength omeprazole 1 pill once daily for now.  I had her bend over in the office today to try to reproduce the pain and it was successful.  She started to have right upper quadrant pains.  I cannot palpate any hernia at this site, she had to straighten up and it seemed to improve with some time.  Clinically her pain seems most consistent with some type of abdominal hernia.  Internal possibly as I cannot detect an abdominal wall defect.  I explained to her that this might be very difficult to prove since it happens mostly when she is bending down to her right.  We will start with an abdominal CT scan abdomen pelvis.  She also understands that whenever the cause gaining 30 pounds in the past 6 months probably exacerbates her abdominal pains.  She is well aware that is trying to lose weight.  Please see the "Patient Instructions" section for addition details about the plan.   Owens Loffler, MD [facility] 02/28/2019, 8:59  AM  Cc: Eulas Post, MD

## 2019-03-01 LAB — BUN/CREATININE RATIO
BUN: 18 mg/dL (ref 7–25)
Creat: 0.94 mg/dL (ref 0.50–0.99)
GFR, Est African American: 72 mL/min/{1.73_m2} (ref >=60)
GFR, Est Non African American: 62 mL/min/{1.73_m2} (ref >=60)

## 2019-03-06 DIAGNOSIS — H26493 Other secondary cataract, bilateral: Secondary | ICD-10-CM | POA: Diagnosis not present

## 2019-03-06 DIAGNOSIS — H40053 Ocular hypertension, bilateral: Secondary | ICD-10-CM | POA: Diagnosis not present

## 2019-03-11 ENCOUNTER — Inpatient Hospital Stay: Admission: RE | Admit: 2019-03-11 | Payer: PPO | Source: Ambulatory Visit

## 2019-03-21 ENCOUNTER — Ambulatory Visit (INDEPENDENT_AMBULATORY_CARE_PROVIDER_SITE_OTHER)
Admission: RE | Admit: 2019-03-21 | Discharge: 2019-03-21 | Disposition: A | Discharge status: Home / Self Care | Payer: PPO | Source: Ambulatory Visit | Attending: Gastroenterology | Admitting: Gastroenterology

## 2019-03-21 ENCOUNTER — Other Ambulatory Visit: Payer: Self-pay

## 2019-03-21 DIAGNOSIS — R1011 Right upper quadrant pain: Secondary | ICD-10-CM

## 2019-03-21 DIAGNOSIS — K449 Diaphragmatic hernia without obstruction or gangrene: Secondary | ICD-10-CM | POA: Diagnosis not present

## 2019-03-21 DIAGNOSIS — K760 Fatty (change of) liver, not elsewhere classified: Secondary | ICD-10-CM | POA: Diagnosis not present

## 2019-03-21 MED ORDER — IOHEXOL 300 MG/ML  SOLN
100.0000 mL | Freq: Once | INTRAMUSCULAR | Status: AC | PRN
  Administered 2019-03-21: 100 mL via INTRAVENOUS

## 2019-03-25 ENCOUNTER — Telehealth: Payer: Self-pay | Admitting: Gastroenterology

## 2019-03-25 NOTE — Telephone Encounter (Signed)
See result note.  

## 2019-04-02 DIAGNOSIS — H26491 Other secondary cataract, right eye: Secondary | ICD-10-CM | POA: Diagnosis not present

## 2019-04-22 ENCOUNTER — Other Ambulatory Visit: Payer: Self-pay | Admitting: Gastroenterology

## 2019-04-22 DIAGNOSIS — Z1159 Encounter for screening for other viral diseases: Secondary | ICD-10-CM | POA: Diagnosis not present

## 2019-04-22 LAB — SARS CORONAVIRUS 2 (TAT 6-24 HRS): SARS Coronavirus 2: NEGATIVE

## 2019-04-23 ENCOUNTER — Ambulatory Visit (AMBULATORY_SURGERY_CENTER): Payer: PPO | Admitting: *Deleted

## 2019-04-23 ENCOUNTER — Other Ambulatory Visit: Payer: Self-pay

## 2019-04-23 ENCOUNTER — Encounter: Payer: Self-pay | Admitting: Gastroenterology

## 2019-04-23 VITALS — Ht 66.6 in | Wt 250.0 lb

## 2019-04-23 DIAGNOSIS — R1011 Right upper quadrant pain: Secondary | ICD-10-CM

## 2019-04-23 NOTE — Progress Notes (Signed)
No egg or soy allergy known to patient  No issues with past sedation with any surgeries  or procedures, no intubation problems  No diet pills per patient No home 02 use per patient  No blood thinners per patient  Pt denies issues with constipation  No A fib or A flutter  EMMI video sent to pt's e mail   Due to the COVID-19 pandemic we are asking patients to follow these guidelines. Please only bring one care partner. Please be aware that your care partner may wait in the car in the parking lot or if they feel like they will be too hot to wait in the car, they may wait in the lobby on the 4th floor. All care partners are required to wear a mask the entire time (we do not have any that we can provide them), they need to practice social distancing, and we will do a Covid check for all patient's and care partners when you arrive. Also we will check their temperature and your temperature. If the care partner waits in their car they need to stay in the parking lot the entire time and we will call them on their cell phone when the patient is ready for discharge so they can bring the car to the front of the building. Also all patient's will need to wear a mask into building.   Pre-visit completed over phone pt. Verified that she received instruction packet and stated she had covid test yesterday.

## 2019-04-25 ENCOUNTER — Ambulatory Visit (AMBULATORY_SURGERY_CENTER): Payer: PPO | Admitting: Gastroenterology

## 2019-04-25 ENCOUNTER — Encounter: Payer: Self-pay | Admitting: Gastroenterology

## 2019-04-25 ENCOUNTER — Other Ambulatory Visit: Payer: Self-pay

## 2019-04-25 ENCOUNTER — Other Ambulatory Visit: Payer: Self-pay | Admitting: Gastroenterology

## 2019-04-25 VITALS — BP 145/78 | HR 62 | Temp 98.0°F | Resp 15 | Ht 66.0 in

## 2019-04-25 DIAGNOSIS — K2950 Unspecified chronic gastritis without bleeding: Secondary | ICD-10-CM | POA: Diagnosis not present

## 2019-04-25 DIAGNOSIS — R1011 Right upper quadrant pain: Secondary | ICD-10-CM

## 2019-04-25 DIAGNOSIS — K3189 Other diseases of stomach and duodenum: Secondary | ICD-10-CM

## 2019-04-25 DIAGNOSIS — K2970 Gastritis, unspecified, without bleeding: Secondary | ICD-10-CM | POA: Diagnosis not present

## 2019-04-25 MED ORDER — SODIUM CHLORIDE 0.9 % IV SOLN: 500.0000 mL | INTRAVENOUS | Status: DC

## 2019-04-25 NOTE — Progress Notes (Signed)
PT taken to PACU. Monitors in place. VSS. Report given to RN. 

## 2019-04-25 NOTE — Op Note (Signed)
[facility] Patient Name: Emily [name] Procedure Date: 04/25/2019 9:27 AM MRN: [id number] Endoscopist: Milus Banister , MD Age: 68 Referring MD:  Date of Birth: March 10, 1951 Gender: Female Account #: [id number] Procedure:                Upper GI endoscopy Indications:              Abdominal pain in the right upper quadrant, often                            position (when bending over, a grabbing sensation).                            CT scan 10/20 showed no clear cause, notably no                            hernias Medicines:                Monitored Anesthesia Care Procedure:                Pre-Anesthesia Assessment:                           - Prior to the procedure, a History and Physical                            was performed, and patient medications and                            allergies were reviewed. The patient's tolerance of                            previous anesthesia was also reviewed. The risks                            and benefits of the procedure and the sedation                            options and risks were discussed with the patient.                            All questions were answered, and informed consent                            was obtained. Prior Anticoagulants: The patient has                            taken no previous anticoagulant or antiplatelet                            agents. ASA Grade Assessment: II - A patient with                            mild systemic disease. After reviewing the risks  and benefits, the patient was deemed in                            satisfactory condition to undergo the procedure.                           After obtaining informed consent, the endoscope was                            passed under direct vision. Throughout the                            procedure, the patient's blood pressure, pulse, and                            oxygen saturations were monitored continuously.  The                            Endoscope was introduced through the mouth, and                            advanced to the second part of duodenum. The upper                            GI endoscopy was accomplished without difficulty.                            The patient tolerated the procedure well. Scope In: Scope Out: Findings:                 Mild inflammation characterized by erythema and                            friability was found in the gastric antrum.                            Biopsies were taken with a cold forceps for                            histology.                           The exam was otherwise without abnormality. Complications:            No immediate complications. Estimated blood loss:                            None. Estimated Blood Loss:     Estimated blood loss: none. Impression:               - Gastritis. Biopsied.                           - The examination was otherwise normal. Recommendation:           - Patient has a contact number available for  emergencies. The signs and symptoms of potential                            delayed complications were discussed with the                            patient. Return to normal activities tomorrow.                            Written discharge instructions were provided to the                            patient.                           - Resume previous diet.                           - Continue present medications.                           - Await pathology results. If biopsies show no H.                            pylori then will consider referral to general                            surgery for your unusual often positional                            (hernia-like?) RUQ pains. Milus Banister, MD 04/25/2019 9:39:04 AM This report has been signed electronically.

## 2019-04-25 NOTE — Progress Notes (Signed)
Called to room to assist during endoscopic procedure.  Patient ID and intended procedure confirmed with present staff. Received instructions for my participation in the procedure from the performing physician.  

## 2019-04-25 NOTE — Progress Notes (Signed)
Temp JB V/S CW 

## 2019-04-25 NOTE — Patient Instructions (Signed)
Discharge instructions given. Biopsies taken. Resume previous medications. YOU HAD AN ENDOSCOPIC PROCEDURE TODAY AT THE [facility]:   Refer to the procedure report that was given to you for any specific questions about what was found during the examination.  If the procedure report does not answer your questions, please call your gastroenterologist to clarify.  If you requested that your care partner not be given the details of your procedure findings, then the procedure report has been included in a sealed envelope for you to review at your convenience later.  YOU SHOULD EXPECT: Some feelings of bloating in the abdomen. Passage of more gas than usual.  Walking can help get rid of the air that was put into your GI tract during the procedure and reduce the bloating. If you had a lower endoscopy (such as a colonoscopy or flexible sigmoidoscopy) you may notice spotting of blood in your stool or on the toilet paper. If you underwent a bowel prep for your procedure, you may not have a normal bowel movement for a few days.  Please Note:  You might notice some irritation and congestion in your nose or some drainage.  This is from the oxygen used during your procedure.  There is no need for concern and it should clear up in a day or so.  SYMPTOMS TO REPORT IMMEDIATELY:   Following upper endoscopy (EGD)  Vomiting of blood or coffee ground material  New chest pain or pain under the shoulder blades  Painful or persistently difficult swallowing  New shortness of breath  Fever of 100F or higher  Black, tarry-looking stools  For urgent or emergent issues, a gastroenterologist can be reached at any hour by calling [phone].   DIET:  We do recommend a small meal at first, but then you may proceed to your regular diet.  Drink plenty of fluids but you should avoid alcoholic beverages for 24 hours.  ACTIVITY:  You should plan to take it easy for the rest of today and you should NOT DRIVE  or use heavy machinery until tomorrow (because of the sedation medicines used during the test).    FOLLOW UP: Our staff will call the number listed on your records 48-72 hours following your procedure to check on you and address any questions or concerns that you may have regarding the information given to you following your procedure. If we do not reach you, we will leave a message.  We will attempt to reach you two times.  During this call, we will ask if you have developed any symptoms of COVID 19. If you develop any symptoms (ie: fever, flu-like symptoms, shortness of breath, cough etc.) before then, please call [phone].  If you test positive for Covid 19 in the 2 weeks post procedure, please call and report this information to us.    If any biopsies were taken you will be contacted by phone or by letter within the next 1-3 weeks.  Please call us at [phone] if you have not heard about the biopsies in 3 weeks.    SIGNATURES/CONFIDENTIALITY: You and/or your care partner have signed paperwork which will be entered into your electronic medical record.  These signatures attest to the fact that that the information above on your After Visit Summary has been reviewed and is understood.  Full responsibility of the confidentiality of this discharge information lies with you and/or your care-partner. 

## 2019-04-29 ENCOUNTER — Telehealth: Payer: Self-pay

## 2019-04-29 NOTE — Telephone Encounter (Signed)
  Follow up Call-  Call back number 04/25/2019  Post procedure Call Back phone  # [phone]  Permission to leave phone message Yes  Some recent data might be hidden     Patient questions:  Do you have a fever, pain , or abdominal swelling? No. Pain Score  0 *  Have you tolerated food without any problems? Yes.    Have you been able to return to your normal activities? Yes.    Do you have any questions about your discharge instructions: Diet   No. Medications  No. Follow up visit  No.  Do you have questions or concerns about your Care? No.  Actions: * If pain score is 4 or above: No action needed, pain <4.  1. Have you developed a fever since your procedure? no  2.   Have you had an respiratory symptoms (SOB or cough) since your procedure? no  3.   Have you tested positive for COVID 19 since your procedure? no  4.   Have you had any family members/close contacts diagnosed with the COVID 19 since your procedure?  no   If yes to any of these questions please route to Joylene John, RN and Alphonsa Gin, Therapist, sports.

## 2019-04-29 NOTE — Telephone Encounter (Signed)
Attempted to reach pt. With follow-up call following endoscopic procedure 04/25/19.  LM on pt. Voice mail.  Will try to reach pt. Again later today.

## 2019-05-01 ENCOUNTER — Ambulatory Visit: Payer: PPO

## 2019-05-07 ENCOUNTER — Other Ambulatory Visit: Payer: Self-pay

## 2019-05-08 ENCOUNTER — Ambulatory Visit (INDEPENDENT_AMBULATORY_CARE_PROVIDER_SITE_OTHER): Payer: PPO | Admitting: *Deleted

## 2019-05-08 DIAGNOSIS — Z23 Encounter for immunization: Secondary | ICD-10-CM

## 2019-05-14 ENCOUNTER — Other Ambulatory Visit: Payer: Self-pay

## 2019-05-20 ENCOUNTER — Telehealth: Payer: Self-pay | Admitting: Gastroenterology

## 2019-05-20 NOTE — Telephone Encounter (Signed)
I spoke with [facility] and they have tried to reach the pt.  She has no voice mail.  I gave them the number to call the pt and try to get her set up.

## 2019-05-21 ENCOUNTER — Other Ambulatory Visit: Payer: Self-pay

## 2019-05-21 DIAGNOSIS — Z20822 Contact with and (suspected) exposure to covid-19: Secondary | ICD-10-CM

## 2019-05-24 LAB — NOVEL CORONAVIRUS, NAA: SARS-CoV-2, NAA: NOT DETECTED

## 2019-06-04 DIAGNOSIS — H33051 Total retinal detachment, right eye: Secondary | ICD-10-CM | POA: Diagnosis not present

## 2019-06-05 DIAGNOSIS — H442E2 Degenerative myopia with other maculopathy, left eye: Secondary | ICD-10-CM | POA: Diagnosis not present

## 2019-06-05 DIAGNOSIS — H442C1 Degenerative myopia with retinal detachment, right eye: Secondary | ICD-10-CM | POA: Diagnosis not present

## 2019-06-05 DIAGNOSIS — H35372 Puckering of macula, left eye: Secondary | ICD-10-CM | POA: Diagnosis not present

## 2019-06-05 DIAGNOSIS — H43813 Vitreous degeneration, bilateral: Secondary | ICD-10-CM | POA: Diagnosis not present

## 2019-06-06 DIAGNOSIS — Z01818 Encounter for other preprocedural examination: Secondary | ICD-10-CM | POA: Diagnosis not present

## 2019-06-09 DIAGNOSIS — F419 Anxiety disorder, unspecified: Secondary | ICD-10-CM | POA: Diagnosis not present

## 2019-06-09 DIAGNOSIS — K219 Gastro-esophageal reflux disease without esophagitis: Secondary | ICD-10-CM | POA: Diagnosis not present

## 2019-06-09 DIAGNOSIS — H442C1 Degenerative myopia with retinal detachment, right eye: Secondary | ICD-10-CM | POA: Diagnosis not present

## 2019-06-09 DIAGNOSIS — Z6841 Body Mass Index (BMI) 40.0 and over, adult: Secondary | ICD-10-CM | POA: Diagnosis not present

## 2019-06-09 DIAGNOSIS — Z888 Allergy status to other drugs, medicaments and biological substances status: Secondary | ICD-10-CM | POA: Diagnosis not present

## 2019-06-09 DIAGNOSIS — H31421 Serous choroidal detachment, right eye: Secondary | ICD-10-CM | POA: Diagnosis not present

## 2019-06-09 DIAGNOSIS — Z87891 Personal history of nicotine dependence: Secondary | ICD-10-CM | POA: Diagnosis not present

## 2019-06-09 DIAGNOSIS — H33021 Retinal detachment with multiple breaks, right eye: Secondary | ICD-10-CM | POA: Diagnosis not present

## 2019-06-09 DIAGNOSIS — Z79899 Other long term (current) drug therapy: Secondary | ICD-10-CM | POA: Diagnosis not present

## 2019-06-09 DIAGNOSIS — F329 Major depressive disorder, single episode, unspecified: Secondary | ICD-10-CM | POA: Diagnosis not present

## 2019-07-10 DIAGNOSIS — H31091 Other chorioretinal scars, right eye: Secondary | ICD-10-CM | POA: Diagnosis not present

## 2019-07-10 DIAGNOSIS — H442C1 Degenerative myopia with retinal detachment, right eye: Secondary | ICD-10-CM | POA: Diagnosis not present

## 2019-08-04 ENCOUNTER — Telehealth: Payer: Self-pay | Admitting: Family Medicine

## 2019-08-04 NOTE — Telephone Encounter (Signed)
Called pt. No answer/VM full. Pt due to schedule Medicare Annual Wellness Visit (AWV) either virtually,audio only or in person (whichever the patient prefers--45 MINUTES).  No hx; please schedule at anytime with [facility]-Nurse Health Advisor at [facility].

## 2019-08-07 ENCOUNTER — Telehealth: Payer: Self-pay | Admitting: Family Medicine

## 2019-08-07 NOTE — Progress Notes (Signed)
  Chronic Care Management   Outreach Note  08/07/2019 Name: Emily [name] MRN: [id number] DOB: [birth year]  Referred by: Eulas Post, MD Reason for referral : No chief complaint on file.   An unsuccessful telephone outreach was attempted today. The patient was referred to the pharmacist for assistance with care management and care coordination.   Follow Up Plan:   Raynicia Dukes [employer] Scheduler

## 2019-08-11 ENCOUNTER — Telehealth: Payer: Self-pay | Admitting: Family Medicine

## 2019-08-11 NOTE — Chronic Care Management (AMB) (Signed)
Chronic Care Management   Note  08/11/2019 Name: Emily [name] MRN: [id number] DOB: July 09, 1950  Emily [name] is a 69 y.o. year old female who is a primary care patient of Burchette, Alinda Sierras, MD. I reached out to [name] by phone today in response to a referral sent by Ms. Emily [name] PCP, Eulas Post, MD.   Emily [name] was given information about Chronic Care Management services today including:  1. CCM service includes personalized support from designated clinical staff supervised by her physician, including individualized plan of care and coordination with other care providers 2. 24/7 contact phone numbers for assistance for urgent and routine care needs. 3. Service will only be billed when office clinical staff spend 20 minutes or more in a month to coordinate care. 4. Only one practitioner may furnish and bill the service in a calendar month. 5. The patient may stop CCM services at any time (effective at the end of the month) by phone call to the office staff. 6. The patient will be responsible for cost sharing (co-pay) of up to 20% of the service fee (after annual deductible is met).  Patient agreed to services and verbal consent obtained.   Follow up plan:   Raynicia Dukes [employer] Scheduler

## 2019-08-12 ENCOUNTER — Other Ambulatory Visit: Payer: Self-pay | Admitting: *Deleted

## 2019-08-12 ENCOUNTER — Telehealth: Payer: Self-pay

## 2019-08-12 DIAGNOSIS — M2550 Pain in unspecified joint: Secondary | ICD-10-CM

## 2019-08-12 DIAGNOSIS — S299XXA Unspecified injury of thorax, initial encounter: Secondary | ICD-10-CM

## 2019-08-12 NOTE — Telephone Encounter (Signed)
I am requesting an ambulatory referral to CCM be placed for this patient. Referral will need to have 2 current diagnosis attached to it. Thank you!  

## 2019-08-12 NOTE — Telephone Encounter (Signed)
OK to refer.

## 2019-08-12 NOTE — Telephone Encounter (Signed)
Referral placed.

## 2019-08-14 ENCOUNTER — Other Ambulatory Visit: Payer: Self-pay | Admitting: Sports Medicine

## 2019-08-14 ENCOUNTER — Ambulatory Visit (INDEPENDENT_AMBULATORY_CARE_PROVIDER_SITE_OTHER): Payer: PPO | Admitting: Sports Medicine

## 2019-08-14 ENCOUNTER — Other Ambulatory Visit: Payer: Self-pay

## 2019-08-14 ENCOUNTER — Ambulatory Visit (INDEPENDENT_AMBULATORY_CARE_PROVIDER_SITE_OTHER): Payer: PPO

## 2019-08-14 ENCOUNTER — Encounter: Payer: Self-pay | Admitting: Sports Medicine

## 2019-08-14 DIAGNOSIS — M170 Bilateral primary osteoarthritis of knee: Secondary | ICD-10-CM

## 2019-08-14 DIAGNOSIS — S8991XA Unspecified injury of right lower leg, initial encounter: Secondary | ICD-10-CM

## 2019-08-14 DIAGNOSIS — M25561 Pain in right knee: Secondary | ICD-10-CM | POA: Diagnosis not present

## 2019-08-14 DIAGNOSIS — S8992XA Unspecified injury of left lower leg, initial encounter: Secondary | ICD-10-CM

## 2019-08-14 DIAGNOSIS — M25562 Pain in left knee: Secondary | ICD-10-CM | POA: Diagnosis not present

## 2019-08-14 DIAGNOSIS — M1711 Unilateral primary osteoarthritis, right knee: Secondary | ICD-10-CM | POA: Diagnosis not present

## 2019-08-14 DIAGNOSIS — W19XXXA Unspecified fall, initial encounter: Secondary | ICD-10-CM

## 2019-08-14 HISTORY — DX: Unspecified injury of right lower leg, initial encounter: S89.91XA

## 2019-08-14 NOTE — Progress Notes (Signed)
    Procedures performed today:    Procedure: Real-time Ultrasound Guided injection of the left knee Device: Samsung HS60  Verbal informed consent obtained.  Time-out conducted.  Noted no overlying erythema, induration, or other signs of local infection.  Skin prepped in a sterile fashion.  Local anesthesia: Topical Ethyl chloride.  With sterile technique and under real time ultrasound guidance: 1 cc Kenalog 40, 2 cc lidocaine, 2 cc bupivacaine injected easily Completed without difficulty  Pain immediately resolved suggesting accurate placement of the medication.  Advised to call if fevers/chills, erythema, induration, drainage, or persistent bleeding.  Images permanently stored and available for review in the ultrasound unit.  Impression: Technically successful ultrasound guided injection.  Procedure: Real-time Ultrasound Guided injection of the right knee Device: Samsung HS60  Verbal informed consent obtained.  Time-out conducted.  Noted no overlying erythema, induration, or other signs of local infection.  Skin prepped in a sterile fashion.  Local anesthesia: Topical Ethyl chloride.  With sterile technique and under real time ultrasound guidance: 1 cc Kenalog 40, 2 cc lidocaine, 2 cc bupivacaine injected easily Completed without difficulty  Pain immediately resolved suggesting accurate placement of the medication.  Advised to call if fevers/chills, erythema, induration, drainage, or persistent bleeding.  Images permanently stored and available for review in the ultrasound unit.  Impression: Technically successful ultrasound guided injection.  Independent interpretation of tests performed by another provider:   I personally reviewed her knee x-rays, they show mild to moderate osteoarthritis, with medial joint space narrowing, no evidence of fractures, few possible intra-articular loose bodies on the right.  Impression and Recommendations:    Primary osteoarthritis of both  knees Emily [name] is a pleasant 69 year old female, she is had slow progression of bilateral right worse than left knee pain, no acute trauma. Pain is mostly at the medial joint line with mild swelling, moderate gelling. She has tried 800 mg of ibuprofen every 4 to every 6 hours without significant improvement. He did take some of her husband's pain medication which provided good relief temporarily. X-rays were personally reviewed and show bilateral knee osteoarthritis. Bilateral knee injections today, of note she does occasionally get behavioral disturbances with high-dose prednisone. Knee exercises given, return to see me in 6 weeks.    ___________________________________________ Emily [name], M.D., ABFM., CAQSM. Primary Care and [place] Instructor of [employer] [facility] [employer] [facility]

## 2019-08-14 NOTE — Chronic Care Management (AMB) (Signed)
Chronic Care Management Pharmacy  Name: Emily [name]     MRN: [id number]     DOB: 23-Dec-1950  Initial Questions: 1. Have you seen any other providers since your last visit? NA 2. Any changes in your medicines or health? No   Chief Complaint/ HPI Emily [name],  69 y.o. , female presents for their Initial CCM visit with the clinical pharmacist via telephone due to COVID-19 Pandemic.  PCP : Eulas Post, MD  Their chronic conditions include: Anxiety, Insomnia, Acid reflux, Gout, Fluid retention, Elevated eye pressure , Osteoarthritis   Office Visits: 12/31/2018- Patient presented via telephone to Dr. Carolann Littler, MD for abdominal pain. Ultrasound was obtain which revealed no gallstones or gallbladder wall thickening. Referral to GI made and recommended to reduce saturated fats including weight loss program.   11/13/2018- Patient presented via telephone to Dr. Carolann Littler, MD. Patient complained of intermittent right upper quadrant abdominal pain. Patient to present in office next day for labs: CBC, CMP, lipase and set up abdominal ultrasound.    Consult Visit: 08/14/2019- Sports Medicine- Patient presented to Dr. Aundria Mems, MD for right and left knee injury/ osteoarthritis. Patient previously tried ibuprofen 800mg  with no improvement. Xrays were done and showed bilateral knee osteoarthritis. Patient received bilateral knee injections and was given knee exercises. Follow up in 6 weeks.    06/09/2019- Outpatient procedure for retinal detachment with Dr. Ernst Breach.   Medications: Outpatient Encounter Medications as of 08/15/2019  Medication Sig Note  . ALPRAZolam (XANAX) 1 MG tablet Take 1 mg by mouth 3 (three) times daily as needed. For anxiety.   . ARIPiprazole (ABILIFY) 2 MG tablet Take 2 mg by mouth at bedtime.    . D3-50 50000 units capsule Take 50,000 Units by mouth once a week.   . desvenlafaxine (PRISTIQ) 100 MG 24 hr tablet Take 100 mg by mouth every  morning.   . dorzolamide (TRUSOPT) 2 % ophthalmic solution Place 1 drop into both eyes 2 (two) times daily.   [name] latanoprost (XALATAN) 0.005 % ophthalmic solution Place 1 drop into both eyes at bedtime.   . traZODone (DESYREL) 100 MG tablet Take 300 mg by mouth at bedtime.    . colchicine 0.6 MG tablet Take 1 tablet (0.6 mg total) by mouth 2 (two) times daily as needed. For gout flareups. (Patient not taking: Reported on 08/15/2019)   . dorzolamide-timolol (COSOPT) 22.3-6.8 MG/ML ophthalmic solution Place 1 drop into the right eye 2 (two) times daily.   . furosemide (LASIX) 20 MG tablet TAKE 1 TABLET BY MOUTH EVERY DAY AS NEEDED   . loratadine (CLARITIN) 10 MG tablet Take 10 mg by mouth daily. 04/23/2019: TAKING AS NEEDED  . omeprazole (PRILOSEC) 40 MG capsule Take 1 capsule (40 mg total) by mouth daily for 14 days.   . ondansetron (ZOFRAN ODT) 4 MG disintegrating tablet Take 1 tablet (4 mg total) by mouth every 8 (eight) hours as needed for nausea or vomiting. (Patient not taking: Reported on 02/28/2019)   . triamcinolone ointment (KENALOG) 0.5 % Apply 1 application topically 2 (two) times daily. (Patient not taking: Reported on 04/23/2019)    No facility-administered encounter medications on file as of 08/15/2019.    Current Diagnosis/Assessment:  Goals Addressed            This Visit's Progress   . Pharmacy Care Plan       Current Barriers:  . Chronic Disease Management support, education, and care coordination needs related to  Anxiety, Insomnia, Acid reflux, Gout, Fluid retention, Elevated eye pressure , Osteoarthritis   Pharmacist Clinical Goal(s):  [name] Continue with diet modifications to decrease risk for gout flare-ups. . Minimize reflux symptoms  . Continue to see improvement in anxiety symptoms. . Continue to see improvement in insomnia.  . Minimize gout flare-ups  Interventions: . Collaboration with provider re: medication management . Comprehensive medication review  performed. . Discussed non-pharmacological interventions for acid reflux. Take measures to prevent acid reflux, such as avoiding spicy foods, avoiding caffeine, avoid laying down a few hours after eating, and raising the head of the bed.  . Discussed eating a low-purine diet to decrease risk of gout flare-ups.. Avoid foods and drinks such as: liver, kidney, anchovies, asparagus, herring, mushrooms, mussels, beer,etc. . Discussed non-pharmacological interventions for insomnia. (Avoid napping, limit exposure to technology near bedtime, etc). . Discussed importance of taking medications as directed.  [name] Refilled furosemide 20mg  and colchicine 0.6mg .   Patient Self Care Activities:  . Calls provider office for new concerns or questions.  . Continue current medications as directed by providers.  . Continue following up with specialists.  Initial goal documentation        Anxiety  Patient has failed these meds in past: bupropion, citalopram  Patient is currently controlled on the following medications:  - Desvenlafaxine (Pristiq) 100mg , 1 tablet every morning - Alprazolam 1mg , 1 tab three times daily as needed (patient reports taking once a week or less frequent) - Abilify 2mg , 1 tablet at bedtime (patient states she did not react well with prednisone and was given a month course only).  - Vitamin D3 50,000 units, 1 capsule once a week  We discussed:  cautioned use of benzodiazepines in older adults and vitamin D blood level monitoring   Plan Managed by Dr. Toy Care (psychiatrist).  Patient trialed on vitamin D to see improvement in mood. Patient reports improvement and vitamin D monitored by Dr. Toy Care.  Continue current medications.    Insomnia   Patient has failed these meds in past: none  Patient is currently controlled on the following medications:  - Trazodone 100mg , 3 tablets at bedtime  We discussed:  non-pharmacological interventions for insomnia. (Avoid napping, limit exposure to  technology near bedtime, etc).  Plan Patient states had issues with insomnia for years and trazodone helps her fall asleep.  Last night, not a good night since she received steroids shots yesterday.  Managed by Dr. Toy Care (psychiatrist).   Continue current medications.   Allergies  Patient has failed these meds in past: none  Patient is currently controlled on the following medications:  - Loratadine 10mg , 1 tablet daily (does not use on regular basis).   Plan Continue current medications.    Acid reflux   Patient has failed these meds in past: pantoprazole  Patient is currently controlled on the following medications: currently not taking any medications.  - Omeprazole 40mg , 1 capsule daily (was on 14 day course)  We discussed:  non-pharmacological interventions for acid reflux. Take measures to prevent acid reflux, such as avoiding spicy foods, avoiding caffeine, avoid laying down a few hours after eating, and raising the head of the bed.   Plan Patient took for short term; not taking currently. Currently presents with no GERD-like symptoms.   Managed by Dr. Ardis Hughs (gastro).  Continue as is.   Gout   Patient has failed these meds in past: none  Patient is currently controlled on the following medications:  - diet controlled (avoids shellfish  as this is her trigger).  - Colchicine 0.6mg , 1 tablet twice daily as needed   We discussed:  Eating a low-purine diet. Avoid foods and drinks such as: liver, kidney, anchovies, asparagus, herring, mushrooms, mussels, beer,etc.  Plan Per patient, last gout flare-up was 6 years ago.  Continue current medications  Consulted Dr. Elease Hashimoto as patient requested refill for colchicine.  Fluid retention  Patient has failed these meds in past: none  Patient is currently controlled on the following medications:  - Furosemide 20mg , 1 tablet daily as needed  Plan Patient states she has not taken furosemide in quite a while as she is out. She  has noticed beginnings of slight swelling in her extremities.  Consulted Dr. Elease Hashimoto- agreed for 1x refill.   Elevated pressure in eye   Patient has failed these meds in past: none  Patient is currently controlled on the following medications:  - Latanoprost 0.005%, 1 drop in both eyes at bedtime  - dorzolamide/ timolol, 1 drop in both eyes  Patient recently had surgery for detached retina on 06/09/19.   Plan Managed by Dr. Ernst Breach (eye surgeon) and Dr. Jola Schmidt (optometrist).  Continue current medications.   Osteoarthritis   Patient has failed these meds in past: ibuprofen Patient is currently managed on the following medications:  - steroid injections.   Plan Managed by Dr. Aundria Mems (sports medicine).  Continue current medications.     Follow up Will follow up with patient for future telephone appointment.  (Schedule uncertain for patient).  Refills for furosemide and colchicine sent for 1x fill.     Anson Crofts, PharmD Clinical Pharmacist [facility] [phone]

## 2019-08-14 NOTE — Assessment & Plan Note (Signed)
Emily [name] is a pleasant 69 year old female, she is had slow progression of bilateral right worse than left knee pain, no acute trauma. Pain is mostly at the medial joint line with mild swelling, moderate gelling. She has tried 800 mg of ibuprofen every 4 to every 6 hours without significant improvement. He did take some of her husband's pain medication which provided good relief temporarily. X-rays were personally reviewed and show bilateral knee osteoarthritis. Bilateral knee injections today, of note she does occasionally get behavioral disturbances with high-dose prednisone. Knee exercises given, return to see me in 6 weeks.

## 2019-08-15 ENCOUNTER — Ambulatory Visit: Payer: PPO

## 2019-08-15 ENCOUNTER — Telehealth: Payer: Self-pay | Admitting: *Deleted

## 2019-08-15 DIAGNOSIS — M2550 Pain in unspecified joint: Secondary | ICD-10-CM

## 2019-08-15 DIAGNOSIS — M170 Bilateral primary osteoarthritis of knee: Secondary | ICD-10-CM

## 2019-08-15 MED ORDER — COLCHICINE 0.6 MG PO TABS: 0.6000 mg | ORAL_TABLET | Freq: Two times a day (BID) | ORAL | 3 refills | Status: DC | PRN

## 2019-08-15 MED ORDER — FUROSEMIDE 20 MG PO TABS: 20.0000 mg | ORAL_TABLET | Freq: Every day | ORAL | 1 refills | Status: DC | PRN

## 2019-08-15 NOTE — Telephone Encounter (Signed)
-----   Message from [place], [facility] sent at 08/15/2019  3:52 PM EST ----- Regarding: Refills needed Can you send refills for furosemide and colchicine for a 1x fill to [facility]? Thanks!

## 2019-08-15 NOTE — Patient Instructions (Addendum)
Visit Information  Goals Addressed            This Visit's Progress   . Pharmacy Care Plan       Current Barriers:  . Chronic Disease Management support, education, and care coordination needs related to  Anxiety, Insomnia, Acid reflux, Gout, Fluid retention, Elevated eye pressure , Osteoarthritis   Pharmacist Clinical Goal(s):  [name] Continue with diet modifications to decrease risk for gout flare-ups. . Minimize reflux symptoms  . Continue to see improvement in anxiety symptoms. . Continue to see improvement in insomnia.  . Minimize gout flare-ups  Interventions: . Collaboration with provider re: medication management . Comprehensive medication review performed. . Discussed non-pharmacological interventions for acid reflux. Take measures to prevent acid reflux, such as avoiding spicy foods, avoiding caffeine, avoid laying down a few hours after eating, and raising the head of the bed.  . Discussed eating a low-purine diet to decrease risk of gout flare-ups.. Avoid foods and drinks such as: liver, kidney, anchovies, asparagus, herring, mushrooms, mussels, beer,etc. . Discussed non-pharmacological interventions for insomnia. (Avoid napping, limit exposure to technology near bedtime, etc). . Discussed importance of taking medications as directed.  [name] Refilled furosemide '20mg'$  and colchicine 0.'6mg'$ .   Patient Self Care Activities:  . Calls provider office for new concerns or questions.  . Continue current medications as directed by providers.  . Continue following up with specialists.  Initial goal documentation        Emily [name] was given information about Chronic Care Management services today including:  1. CCM service includes personalized support from designated clinical staff supervised by her physician, including individualized plan of care and coordination with other care providers 2. 24/7 contact phone numbers for assistance for urgent and routine care needs. 3. Service  will only be billed when office clinical staff spend 20 minutes or more in a month to coordinate care. 4. Only one practitioner may furnish and bill the service in a calendar month. 5. The patient may stop CCM services at any time (effective at the end of the month) by phone call to the office staff. 6. The patient will be responsible for cost sharing (co-pay) of up to 20% of the service fee (after annual deductible is met).  Patient agreed to services and verbal consent obtained.   The patient verbalized understanding of instructions provided today and agreed to receive a mailed copy of patient instruction and/or educational materials. Telephone follow up appointment with pharmacy team member scheduled for: appointment not scheduled. Will reach out to patient in 3 months for general assessment.   Anson Crofts, PharmD Clinical Pharmacist [facility] [phone]   Low-Purine Eating Plan A low-purine eating plan involves making food choices to limit your intake of purine. Purine is a kind of uric acid. Too much uric acid in your blood can cause certain conditions, such as gout and kidney stones. Eating a low-purine diet can help control these conditions. What are tips for following this plan? Reading food labels   Avoid foods with saturated or Trans fat.  Check the ingredient list of grains-based foods, such as bread and cereal, to make sure that they contain whole grains.  Check the ingredient list of sauces or soups to make sure they do not contain meat or fish.  When choosing soft drinks, check the ingredient list to make sure they do not contain high-fructose corn syrup. Shopping  Buy plenty of fresh fruits and vegetables.  Avoid buying canned or  fresh fish.  Buy dairy products labeled as low-fat or nonfat.  Avoid buying premade or processed foods. These foods are often high in fat, salt (sodium), and added sugar. Cooking  Use olive oil  instead of butter when cooking. Oils like olive oil, canola oil, and sunflower oil contain healthy fats. Meal planning  Learn which foods do or do not affect you. If you find out that a food tends to cause your gout symptoms to flare up, avoid eating that food. You can enjoy foods that do not cause problems. If you have any questions about a food item, talk with your dietitian or health care provider.  Limit foods high in fat, especially saturated fat. Fat makes it harder for your body to get rid of uric acid.  Choose foods that are lower in fat and are lean sources of protein. General guidelines  Limit alcohol intake to no more than 1 drink a day for nonpregnant women and 2 drinks a day for men. One drink equals 12 oz of beer, 5 oz of wine, or 1 oz of hard liquor. Alcohol can affect the way your body gets rid of uric acid.  Drink plenty of water to keep your urine clear or pale yellow. Fluids can help remove uric acid from your body.  If directed by your health care provider, take a vitamin C supplement.  Work with your health care provider and dietitian to develop a plan to achieve or maintain a healthy weight. Losing weight can help reduce uric acid in your blood. What foods are recommended? The items listed may not be a complete list. Talk with your dietitian about what dietary choices are best for you. Foods low in purines Foods low in purines do not need to be limited. These include:  All fruits.  All low-purine vegetables, pickles, and olives.  Breads, pasta, rice, cornbread, and popcorn. Cake and other baked goods.  All dairy foods.  Eggs, nuts, and nut butters.  Spices and condiments, such as salt, herbs, and vinegar.  Plant oils, butter, and margarine.  Water, sugar-free soft drinks, tea, coffee, and cocoa.  Vegetable-based soups, broths, sauces, and gravies. Foods moderate in purines Foods moderate in purines should be limited to the amounts listed.   cup of  asparagus, cauliflower, spinach, mushrooms, or green peas, each day.  2/3 cup uncooked oatmeal, each day.   cup dry wheat bran or wheat germ, each day.  2-3 ounces of meat or poultry, each day.  4-6 ounces of shellfish, such as crab, lobster, oysters, or shrimp, each day.  1 cup cooked beans, peas, or lentils, each day.  Soup, broths, or bouillon made from meat or fish. Limit these foods as much as possible. What foods are not recommended? The items listed may not be a complete list. Talk with your dietitian about what dietary choices are best for you. Limit your intake of foods high in purines, including:  Beer and other alcohol.  Meat-based gravy or sauce.  Canned or fresh fish, such as: ? Anchovies, sardines, herring, and tuna. ? Mussels and scallops. ? Codfish, trout, and haddock.  Berniece Salines.  Organ meats, such as: ? Liver or kidney. ? Tripe. ? Sweetbreads (thymus gland or pancreas).  Wild Clinical biochemist.  Yeast or yeast extract supplements.  Drinks sweetened with high-fructose corn syrup. Summary  Eating a low-purine diet can help control conditions caused by too much uric acid in the body, such as gout or kidney stones.  Choose low-purine foods,  limit alcohol, and limit foods high in fat.  You will learn over time which foods do or do not affect you. If you find out that a food tends to cause your gout symptoms to flare up, avoid eating that food. This information is not intended to replace advice given to you by your health care provider. Make sure you discuss any questions you have with your health care provider. Document Revised: 05/18/2017 Document Reviewed: 07/19/2016 Elsevier Patient Education  2020 [place] for Gastroesophageal Reflux Disease, Adult When you have gastroesophageal reflux disease (GERD), the foods you eat and your eating habits are very important. Choosing the right foods can help ease your discomfort. Think about working  with a nutrition specialist (dietitian) to help you make good choices. What are tips for following this plan?  Meals  Choose healthy foods that are low in fat, such as fruits, vegetables, whole grains, low-fat dairy products, and lean meat, fish, and poultry.  Eat small meals often instead of 3 large meals a day. Eat your meals slowly, and in a place where you are relaxed. Avoid bending over or lying down until 2-3 hours after eating.  Avoid eating meals 2-3 hours before bed.  Avoid drinking a lot of liquid with meals.  Cook foods using methods other than frying. Bake, grill, or broil food instead.  Avoid or limit: ? Chocolate. ? Peppermint or spearmint. ? Alcohol. ? Pepper. ? Black and decaffeinated coffee. ? Black and decaffeinated tea. ? Bubbly (carbonated) soft drinks. ? Caffeinated energy drinks and soft drinks.  Limit high-fat foods such as: ? Fatty meat or fried foods. ? Whole milk, cream, butter, or ice cream. ? Nuts and nut butters. ? Pastries, donuts, and sweets made with butter or shortening.  Avoid foods that cause symptoms. These foods may be different for everyone. Common foods that cause symptoms include: ? Tomatoes. ? Oranges, lemons, and limes. ? Peppers. ? Spicy food. ? Onions and garlic. ? Vinegar. Lifestyle  Maintain a healthy weight. Ask your doctor what weight is healthy for you. If you need to lose weight, work with your doctor to do so safely.  Exercise for at least 30 minutes for 5 or more days each week, or as told by your doctor.  Wear loose-fitting clothes.  Do not smoke. If you need help quitting, ask your doctor.  Sleep with the head of your bed higher than your feet. Use a wedge under the mattress or blocks under the bed frame to raise the head of the bed. Summary  When you have gastroesophageal reflux disease (GERD), food and lifestyle choices are very important in easing your symptoms.  Eat small meals often instead of 3 large  meals a day. Eat your meals slowly, and in a place where you are relaxed.  Limit high-fat foods such as fatty meat or fried foods.  Avoid bending over or lying down until 2-3 hours after eating.  Avoid peppermint and spearmint, caffeine, alcohol, and chocolate. This information is not intended to replace advice given to you by your health care provider. Make sure you discuss any questions you have with your health care provider. Document Revised: 09/26/2018 Document Reviewed: 07/11/2016 Elsevier Patient Education  [place].

## 2019-09-02 ENCOUNTER — Other Ambulatory Visit: Payer: Self-pay | Admitting: Gastroenterology

## 2019-09-07 ENCOUNTER — Other Ambulatory Visit: Payer: Self-pay | Admitting: Family Medicine

## 2019-09-11 DIAGNOSIS — H35372 Puckering of macula, left eye: Secondary | ICD-10-CM | POA: Diagnosis not present

## 2019-09-11 DIAGNOSIS — H442E3 Degenerative myopia with other maculopathy, bilateral eye: Secondary | ICD-10-CM | POA: Diagnosis not present

## 2019-09-11 DIAGNOSIS — H31091 Other chorioretinal scars, right eye: Secondary | ICD-10-CM | POA: Diagnosis not present

## 2019-09-11 DIAGNOSIS — H35412 Lattice degeneration of retina, left eye: Secondary | ICD-10-CM | POA: Diagnosis not present

## 2019-09-25 ENCOUNTER — Encounter: Payer: Self-pay | Admitting: Sports Medicine

## 2019-09-25 ENCOUNTER — Ambulatory Visit (INDEPENDENT_AMBULATORY_CARE_PROVIDER_SITE_OTHER): Payer: PPO

## 2019-09-25 ENCOUNTER — Ambulatory Visit (INDEPENDENT_AMBULATORY_CARE_PROVIDER_SITE_OTHER): Payer: PPO | Admitting: Sports Medicine

## 2019-09-25 ENCOUNTER — Other Ambulatory Visit: Payer: Self-pay

## 2019-09-25 DIAGNOSIS — M47816 Spondylosis without myelopathy or radiculopathy, lumbar region: Secondary | ICD-10-CM

## 2019-09-25 DIAGNOSIS — M545 Low back pain: Secondary | ICD-10-CM | POA: Diagnosis not present

## 2019-09-25 DIAGNOSIS — M170 Bilateral primary osteoarthritis of knee: Secondary | ICD-10-CM

## 2019-09-25 MED ORDER — GABAPENTIN 300 MG PO CAPS: ORAL_CAPSULE | ORAL | 3 refills | Status: DC

## 2019-09-25 NOTE — Assessment & Plan Note (Signed)
For several years Emily [name] had axial low back pain, bilateral in the lower lumbar spine, nothing radicular, no bowel or bladder dysfunction. Her pain is more related to her lumbar spinal stenosis with facetogenic type pain, worse with standing, leaning back, better with flexion. I did personally review her CT scan of her abdomen and pelvis done for a different process, she has severe lumbar spinal stenosis at L4-L5, she also has bilateral right worse than left facet arthrosis and hypertrophy at L3-L4 and L4-L5. We are to start conservative with formal physical therapy, gabapentin, baseline x-rays, return to see me in 6 weeks, if no better we will proceed with facet medial branch blocks and radiofrequency ablation if successful.

## 2019-09-25 NOTE — Progress Notes (Signed)
    Procedures performed today:    None.  Independent interpretation of notes and tests performed by another provider:   I did personally review her CT scan of her abdomen and pelvis done for a different process, she has severe lumbar spinal stenosis at L4-L5, she also has bilateral right worse than left facet arthrosis and hypertrophy at L3-L4 and L4-L5.  Brief History, Exam, Impression, and Recommendations:    Primary osteoarthritis of both knees Emily [name] failed all of her conservative measures so we injected both knees, she returns today pain-free with regards to her knees. Continue rehab exercises. Of note she gets behavioral disturbances with prednisone, we are going to use any today but if we do we would probably add a short course of Abilify in the meantime.  Lumbar spondylosis For several years Emily [name] had axial low back pain, bilateral in the lower lumbar spine, nothing radicular, no bowel or bladder dysfunction. Her pain is more related to her lumbar spinal stenosis with facetogenic type pain, worse with standing, leaning back, better with flexion. I did personally review her CT scan of her abdomen and pelvis done for a different process, she has severe lumbar spinal stenosis at L4-L5, she also has bilateral right worse than left facet arthrosis and hypertrophy at L3-L4 and L4-L5. We are to start conservative with formal physical therapy, gabapentin, baseline x-rays, return to see me in 6 weeks, if no better we will proceed with facet medial branch blocks and radiofrequency ablation if successful.   ___________________________________________ Gwen Her. Dianah Field, M.D., ABFM., CAQSM. Primary Care and [place] Instructor of [place] [employer] [facility] of [employer]

## 2019-09-25 NOTE — Assessment & Plan Note (Signed)
Emily [name] failed all of her conservative measures so we injected both knees, she returns today pain-free with regards to her knees. Continue rehab exercises. Of note she gets behavioral disturbances with prednisone, we are going to use any today but if we do we would probably add a short course of Abilify in the meantime.

## 2019-10-02 ENCOUNTER — Ambulatory Visit: Payer: PPO | Attending: Internal Medicine

## 2019-10-02 DIAGNOSIS — Z23 Encounter for immunization: Secondary | ICD-10-CM

## 2019-10-02 NOTE — Progress Notes (Signed)
   Covid-19 Vaccination Clinic  Name:  Emily [name]    MRN: [id number] DOB: Feb 18, 1951  10/02/2019  [name] was observed post Covid-19 immunization for 15 minutes without incident. She was provided with Vaccine Information Sheet and instruction to access the V-Safe system.   [name] was instructed to call 911 with any severe reactions post vaccine: [name] Difficulty breathing  . Swelling of face and throat  . A fast heartbeat  . A bad rash all over body  . Dizziness and weakness   Immunizations Administered    Name Date Dose VIS Date Route   Pfizer COVID-19 Vaccine 10/02/2019  8:28 AM 0.3 mL 05/30/2019 Intramuscular   Manufacturer: [place]   Lot: [id number]   [place]: [id number]

## 2019-10-08 ENCOUNTER — Ambulatory Visit (INDEPENDENT_AMBULATORY_CARE_PROVIDER_SITE_OTHER): Payer: PPO | Admitting: Rehabilitative and Restorative Service Providers"

## 2019-10-08 ENCOUNTER — Other Ambulatory Visit: Payer: Self-pay

## 2019-10-08 DIAGNOSIS — R293 Abnormal posture: Secondary | ICD-10-CM | POA: Diagnosis not present

## 2019-10-08 DIAGNOSIS — M5440 Lumbago with sciatica, unspecified side: Secondary | ICD-10-CM | POA: Diagnosis not present

## 2019-10-08 DIAGNOSIS — M6281 Muscle weakness (generalized): Secondary | ICD-10-CM

## 2019-10-08 NOTE — Therapy (Signed)
[facility] [place], [place], [address] Phone: [phone]   Fax:  [phone]  Physical Therapy Evaluation  Patient Details  Name: Emily [name] MRN: [id number] Date of Birth: May 02, 1951 Referring Provider (PT): Aundria Mems, MD   Encounter Date: 10/08/2019  PT End of Session - 10/08/19 1150    Visit Number  1    Number of Visits  12    Date for PT Re-Evaluation  11/19/19    PT Start Time  1105    PT Stop Time  1146    PT Time Calculation (min)  41 min       Past Medical History:  Diagnosis Date  . Allergy    seasonal   . Anxiety   . Cancer ([place]) 2009   squamous cell skin  . Cataract    bilateral surgery, left unsuccessful  . Depression   . Eating disorder   . GERD (gastroesophageal reflux disease)   . Glaucoma   . Hiatal hernia   . History of cardiovascular stress test 04/2000   cardiolyte  . History of flexible sigmoidoscopy 09/21/1999    Past Surgical History:  Procedure Laterality Date  . ABDOMINAL HYSTERECTOMY    . APPENDECTOMY    . CARDIAC CATHETERIZATION  04/2000  . CARDIAC CATHETERIZATION    . COLONOSCOPY  12/31/09  . UPPER GASTROINTESTINAL ENDOSCOPY  09/15/1999  . WISDOM TOOTH EXTRACTION      There were no vitals filed for this visit.   Subjective Assessment - 10/08/19 1109    Subjective  The patient reports chronic low back pain that is in low back on R and L sides that has been present x 1 year.  She feels symptoms are getting worse over the past month.  She gets referring pain into legs (more often on the R side) that is intermittent throughout the day.  She notes she can walk pain out a lot of the time.    Patient Stated Goals  To be able to walk with as little pain as possible.    Currently in Pain?  Yes    Pain Score  5     Pain Location  Back    Pain Orientation  Left;Lower   worse on L side today   Pain Descriptors / Indicators  Aching;Dull;Sharp    Pain Type   Chronic pain    Pain Onset  More than a month ago    Pain Frequency  Constant   varies in intensity   Aggravating Factors   lifting, when she first rises    Pain Relieving Factors  "sometimes I feel if I could twist my back to pop and crack, it would be better"         [facility] PT Assessment - 10/08/19 1115      Assessment   Medical Diagnosis  lumbar spondylosis    Referring Provider (PT)  Aundria Mems, MD    Onset Date/Surgical Date  --   one year, worsening over past month   Hand Dominance  Right    Prior Therapy  none      Precautions   Precautions  None      Restrictions   Weight Bearing Restrictions  No      Balance Screen   Has the patient fallen in the past 6 months  Yes    How many times?  "I've fallen a lot lately", fell going up steps.      Has the  patient had a decrease in activity level because of a fear of falling?   No    Is the patient reluctant to leave their home because of a fear of falling?   No      Home Film/video editor residence    Living Arrangements  Spouse/significant other    Home Access  Stairs to enter    Entrance Stairs-Number of Steps  2    [place]  One level      Prior Function   Level of Independence  Independent    Vocation  Full time employment    Vocation Requirements  works at a funeral home; has to  lift occasionally      Observation/Other Assessments   Focus on Therapeutic Outcomes (FOTO)   47% limitation      Sensation   Light Touch  Appears Intact      Posture/Postural Control   Posture/Postural Control  Postural limitations    Postural Limitations  Increased thoracic kyphosis;Decreased lumbar lordosis      ROM / Strength   AROM / PROM / Strength  AROM;Strength      AROM   Overall AROM   Deficits    Overall AROM Comments  rotation feels good for patient    AROM Assessment Site  Lumbar    Lumbar Flexion  25% limited    Lumbar Extension  25% limited    Lumbar - Right Side Bend  25%  limited    Lumbar - Left Side Bend  25% limited    Lumbar - Right Rotation  WFLs    Lumbar - Left Rotation  WFLs      Strength   Strength Assessment Site  Hip;Knee;Ankle    Right/Left Hip  Right;Left    Right Hip Flexion  4+/5    Left Hip Flexion  3+/5    Right/Left Knee  Right;Left    Right Knee Flexion  5/5    Right Knee Extension  5/5    Left Knee Flexion  5/5    Left Knee Extension  5/5    Right/Left Ankle  Right;Left    Right Ankle Dorsiflexion  4+/5    Left Ankle Dorsiflexion  4+/5      Flexibility   Soft Tissue Assessment /Muscle Length  yes    Hamstrings  tightness noted Bilat with greater discomfort R hamstring and R hip adductor    Quadriceps  tightness noted bilaterally prone knee flexion    ITB  tenderness to palpation R IT Band      Palpation   Spinal mobility  PA mobility limited thoracic spine and lumbar spine    Palpation comment  Tender to palpation and painful in R hip adductor and IT Band; tender ot palpation along bilateral PSIS      Special Tests    Special Tests  --    Other special tests  spring test along spine- diminished PA mobility                Objective measurements completed on examination: See above findings.      [facility] Adult PT Treatment/Exercise - 10/08/19 1115      Exercises   Exercises  Lumbar      Lumbar Exercises: Stretches   Single Knee to Chest Stretch  Right;1 rep;Left;30 seconds    Lower Trunk Rotation  2 reps;20 seconds    Prone on Elbows Stretch  1 rep;60 seconds    Prone on Elbows  Stretch Limitations  tried reaching alternating UEs, however increases pain    Piriformis Stretch  Right;Left;2 reps;20 seconds    Other Lumbar Stretch Exercise  standing hip adductor stretch right/left x 2 reps x 20 seconds      Lumbar Exercises: Supine   Bent Knee Raise  10 reps    Bent Knee Raise Limitations  with core contraction prior to lifting LEs.             PT Education - 10/08/19 1149    Education Details  HEP     Person(s) Educated  Patient    Methods  Explanation;Demonstration;Handout    Comprehension  Returned demonstration;Verbalized understanding          PT Long Term Goals - 10/08/19 1151      PT LONG TERM GOAL #1   Title  The patient will be indep with HEP.    Time  6    Period  Weeks    Target Date  11/19/19      PT LONG TERM GOAL #2   Title  The patient will reduce functional limitation per FOTO from 47% to < or equal to 36%.    Time  6    Period  Weeks    Target Date  11/19/19      PT LONG TERM GOAL #3   Title  The patient will report resting pain < or equal to 2/10.    Baseline  5/10    Time  6    Period  Weeks    Target Date  11/19/19      PT LONG TERM GOAL #4   Title  The patient will report no pain in LEs with ambulation x 300 ft.    Baseline  notes pain R LE today with antalgic pattern    Time  6    Period  Weeks    Target Date  11/19/19      PT LONG TERM GOAL #5   Title  The patient will returnt o home walking program x 20 minutes.    Time  6    Period  Weeks    Target Date  11/19/19             Plan - 10/08/19 1153    Clinical Impression Statement  The patient is a 69 yo female presenting to OP physical therapy with lumbar spondylosis and h/o chronic low back pain.  She presents with impairments in lumbar ROM, spinal mobility, LE strength, LE flexibility, pain limiting function.  The patient has functional limitations of decreased tolerance to standing, diminished ability to lift at work and limited walking.  PT to address deficits to reduce pain and improve mobility.    Examination-Activity Limitations  Locomotion Level;Squat;Stand;Lift    Stability/Clinical Decision Making  Stable/Uncomplicated    Clinical Decision Making  Low    Rehab Potential  Good    PT Frequency  2x / week    PT Duration  6 weeks    PT Treatment/Interventions  ADLs/Self Care Home Management;Gait training;Stair training;Functional mobility training;Therapeutic  activities;Therapeutic exercise;Manual techniques;Taping;Passive range of motion;Dry needling;Patient/family education;Cryotherapy;Electrical Stimulation;Moist Heat;Traction;Ultrasound    PT Next Visit Plan  Review initial HEP, progress strengthening, work on Economist in standing, goal is to return to walking program (initiate program to tolerance). modalities as needed for pain.    PT Home Exercise Plan  Access Code: [email or web address]    Consulted and Agree with Plan of Care  Patient  Patient will benefit from skilled therapeutic intervention in order to improve the following deficits and impairments:  Pain, Hypomobility, Postural dysfunction, Decreased strength, Decreased range of motion, Impaired flexibility, Improper body mechanics, Decreased activity tolerance  Visit Diagnosis: Acute bilateral low back pain with sciatica, sciatica laterality unspecified  Muscle weakness (generalized)  Abnormal posture     Problem List Patient Active Problem List   Diagnosis Date Noted  . Lumbar spondylosis 09/25/2019  . Right knee injury 08/14/2019  . Primary osteoarthritis of both knees 08/14/2019  . Head injury 07/23/2018  . Hepatic steatosis 10/23/2016  . Ankle sprain 05/18/2011  . Chest wall soft tissue injury 05/18/2011  . Sprain of foot, left 05/18/2011  . VIRAL URI 12/01/2009  . ARTHRALGIA 11/10/2009  . WEIGHT GAIN 11/10/2009  . DYSPNEA 11/10/2009  . INGROWN NAIL 05/19/2009  . DEPRESSION 09/07/2008  . FOOT PAIN, BILATERAL 09/07/2008    Tomeca Helm, PT 10/08/2019, 12:01 PM  [facility] [place], [place], [address] Phone: [phone]   Fax:  [phone]  Name: ABRIENNE TURRILL MRN: [id number] Date of Birth: September 24, 1950

## 2019-10-08 NOTE — Patient Instructions (Signed)
Access Code: [email or web address] URL: [email or web address] Date: 10/08/2019 Prepared by: Rudell Cobb  Exercises Prone on Elbows Stretch - 2 x daily - 7 x weekly - 1 sets - 1 reps - 60 seconds hold Supine Lower Trunk Rotation - 2 x daily - 7 x weekly - 1 sets - 3 reps - 20 seconds hold Supine Piriformis Stretch with Foot on Ground - 2 x daily - 7 x weekly - 1 sets - 2 reps - 20 seconds hold Supine March - 2 x daily - 7 x weekly - 1 sets - 10 reps Side Lunge Adductor Stretch - 2 x daily - 7 x weekly - 1 sets - 2 reps - 20 seconds hold

## 2019-10-11 ENCOUNTER — Other Ambulatory Visit: Payer: Self-pay | Admitting: Family Medicine

## 2019-10-14 ENCOUNTER — Other Ambulatory Visit: Payer: Self-pay

## 2019-10-14 ENCOUNTER — Ambulatory Visit (INDEPENDENT_AMBULATORY_CARE_PROVIDER_SITE_OTHER): Payer: PPO | Admitting: Physical Therapy

## 2019-10-14 ENCOUNTER — Encounter: Payer: Self-pay | Admitting: Physical Therapy

## 2019-10-14 DIAGNOSIS — M5440 Lumbago with sciatica, unspecified side: Secondary | ICD-10-CM | POA: Diagnosis not present

## 2019-10-14 DIAGNOSIS — M6281 Muscle weakness (generalized): Secondary | ICD-10-CM | POA: Diagnosis not present

## 2019-10-14 DIAGNOSIS — R293 Abnormal posture: Secondary | ICD-10-CM

## 2019-10-14 NOTE — Therapy (Signed)
[place], [place], [address] Phone: [phone]   Fax:  [phone]  Physical Therapy Treatment  Patient Details  Name: Emily [name] MRN: [id number] Date of Birth: 04-23-1951 Referring Provider (PT): Aundria Mems, MD   Encounter Date: 10/14/2019  PT End of Session - 10/14/19 1001    Visit Number  2    Number of Visits  12    Date for PT Re-Evaluation  11/19/19    PT Start Time  0937    PT Stop Time  1025    PT Time Calculation (min)  48 min       Past Medical History:  Diagnosis Date  . Allergy    seasonal   . Anxiety   . Cancer ([place]) 2009   squamous cell skin  . Cataract    bilateral surgery, left unsuccessful  . Depression   . Eating disorder   . GERD (gastroesophageal reflux disease)   . Glaucoma   . Hiatal hernia   . History of cardiovascular stress test 04/2000   cardiolyte  . History of flexible sigmoidoscopy 09/21/1999    Past Surgical History:  Procedure Laterality Date  . ABDOMINAL HYSTERECTOMY    . APPENDECTOMY    . CARDIAC CATHETERIZATION  04/2000  . CARDIAC CATHETERIZATION    . COLONOSCOPY  12/31/09  . UPPER GASTROINTESTINAL ENDOSCOPY  09/15/1999  . WISDOM TOOTH EXTRACTION      There were no vitals filed for this visit.  Subjective Assessment - 10/14/19 0941    Subjective  Pt reports she may be doing the exercises too "hard" because she is sore.  No new changes.    Patient Stated Goals  To be able to walk with as little pain as possible.    Currently in Pain?  Yes    Pain Score  4     Pain Location  Back    Pain Orientation  Left;Right;Lower    Pain Descriptors / Indicators  Sore    Pain Onset  More than a month ago    Aggravating Factors   prolonged standing; upon rising in morning    Pain Relieving Factors  ice, some exercises, medicine.         [facility] PT Assessment - 10/14/19 0001      Assessment   Medical Diagnosis  lumbar spondylosis    Referring Provider (PT)  Aundria Mems, MD    Onset Date/Surgical Date  --   one year, worsening over past month   Hand Dominance  Right    Prior Therapy  none       [facility] Adult PT Treatment/Exercise - 10/14/19 0001      Lumbar Exercises: Stretches   Passive Hamstring Stretch  Right;Left;2 reps;20 seconds   supine with strap   Lower Trunk Rotation  2 reps;20 seconds   arms in T.    Piriformis Stretch  Right;Left;2 reps;10 seconds;30 seconds    Piriformis Stretch Limitations  supine not tolerated; switched to modified table pigeon pose with good success.     Other Lumbar Stretch Exercise  standing hip adductor stretch right/left x 2 reps x 20 seconds- not tolerated; switched to supine with strap with good success, 2 reps 20 sec each leg.       Lumbar Exercises: Aerobic   Nustep  L5: arms/legs x 5.5 min       Lumbar Exercises: Seated   Sit to Stand  5 reps   with core engaged.  Lumbar Exercises: Supine   Ab Set  5 reps;5 seconds    Bent Knee Raise  10 reps    Bent Knee Raise Limitations  cues for core contraction prior to lifting LEs.    Bridge  10 reps;3 seconds      Modalities   Modalities  Electrical Stimulation;Cryotherapy      Cryotherapy   Number Minutes Cryotherapy  10 Minutes    Cryotherapy Location  Lumbar Spine    Type of Cryotherapy  Ice pack      Electrical Stimulation   Electrical Stimulation Location  bilat lumbar paraspinals    Electrical Stimulation Action  IFC    Electrical Stimulation Parameters  intensity to tolerance x 10 min     Electrical Stimulation Goals  Pain             PT Education - 10/14/19 1015    Education Details  updated HEP; TENS info    Person(s) Educated  Patient    Methods  Explanation;Demonstration;Handout    Comprehension  Verbalized understanding;Returned demonstration          PT Long Term Goals - 10/08/19 1151      PT LONG TERM GOAL #1   Title  The patient will be indep with HEP.    Time  6     Period  Weeks    Target Date  11/19/19      PT LONG TERM GOAL #2   Title  The patient will reduce functional limitation per FOTO from 47% to < or equal to 36%.    Time  6    Period  Weeks    Target Date  11/19/19      PT LONG TERM GOAL #3   Title  The patient will report resting pain < or equal to 2/10.    Baseline  5/10    Time  6    Period  Weeks    Target Date  11/19/19      PT LONG TERM GOAL #4   Title  The patient will report no pain in LEs with ambulation x 300 ft.    Baseline  notes pain R LE today with antalgic pattern    Time  6    Period  Weeks    Target Date  11/19/19      PT LONG TERM GOAL #5   Title  The patient will returnt o home walking program x 20 minutes.    Time  6    Period  Weeks    Target Date  11/19/19            Plan - 10/14/19 0948    Clinical Impression Statement  Pt complained of Lt knee pain with standing adductor stretch; supine version tolerated better. Piriformis stretch also changed for improved tolerance. Some tactile/VC for core engagement during supine/standing exercise.   HEP updated.  Pt reported reduction of pain after use of estim/ice.  Goals are ongoing.    Examination-Activity Limitations  Locomotion Level;Squat;Stand;Lift    Stability/Clinical Decision Making  Stable/Uncomplicated    Rehab Potential  Good    PT Frequency  2x / week    PT Duration  6 weeks    PT Treatment/Interventions  ADLs/Self Care Home Management;Gait training;Stair training;Functional mobility training;Therapeutic activities;Therapeutic exercise;Manual techniques;Taping;Passive range of motion;Dry needling;Patient/family education;Cryotherapy;Electrical Stimulation;Moist Heat;Traction;Ultrasound    PT Next Visit Plan  progress strengthening, work on Economist in standing, goal is to return to walking program (initiate program to tolerance).  modalities as needed for pain.    PT Home Exercise Plan  Access Code: [email or web address]    Consulted and Agree with Plan of  Care  Patient       Patient will benefit from skilled therapeutic intervention in order to improve the following deficits and impairments:  Pain, Hypomobility, Postural dysfunction, Decreased strength, Decreased range of motion, Impaired flexibility, Improper body mechanics, Decreased activity tolerance, Decreased knowledge of precautions  Visit Diagnosis: Acute bilateral low back pain with sciatica, sciatica laterality unspecified  Muscle weakness (generalized)  Abnormal posture     Problem List Patient Active Problem List   Diagnosis Date Noted  . Lumbar spondylosis 09/25/2019  . Right knee injury 08/14/2019  . Primary osteoarthritis of both knees 08/14/2019  . Head injury 07/23/2018  . Hepatic steatosis 10/23/2016  . Ankle sprain 05/18/2011  . Chest wall soft tissue injury 05/18/2011  . Sprain of foot, left 05/18/2011  . VIRAL URI 12/01/2009  . ARTHRALGIA 11/10/2009  . WEIGHT GAIN 11/10/2009  . DYSPNEA 11/10/2009  . INGROWN NAIL 05/19/2009  . DEPRESSION 09/07/2008  . FOOT PAIN, BILATERAL 09/07/2008   Kerin Perna, PTA 10/14/19 10:35 AM  [place], [place], [address] Phone: [phone]   Fax:  [phone]  Name: Emily [name] MRN: [id number] Date of Birth: 1951-05-16

## 2019-10-14 NOTE — Patient Instructions (Addendum)
TENS UNIT: This is helpful for muscle pain and spasm.   Search and Purchase a TENS 7000 2nd edition at [employer]. It should be less than $30.     TENS unit instructions: Do not shower or bathe with the unit on Turn the unit off before removing electrodes or batteries If the electrodes lose stickiness add a drop of water to the electrodes after they are disconnected from the unit and place on plastic sheet. If you continued to have difficulty, call the TENS unit company to purchase more electrodes. Do not apply lotion on the skin area prior to use. Make sure the skin is clean and dry as this will help prolong the life of the electrodes. After use, always check skin for unusual red areas, rash or other skin difficulties. If there are any skin problems, does not apply electrodes to the same area. Never remove the electrodes from the unit by pulling the wires. Do not use the TENS unit or electrodes other than as directed. Do not change electrode placement without consultating your therapist or physician. Keep 2 fingers with between each electrode. Wear time ratio is 2:1, on to off times.    For example on for 30 minutes off for 15 minutes and then on for 30 minutes off for 15 minutes  Access Code: [email or web address]: [email or web address]: 04/27/2021Prepared by: [facility] KernersvilleExercises  Prone on Elbows Stretch - 2 x daily - 7 x weekly - 1 sets - 1 reps - 60 seconds hold  Supine Lower Trunk Rotation - 2 x daily - 7 x weekly - 1 sets - 3 reps - 20 seconds hold  Supine March - 2 x daily - 7 x weekly - 1 sets - 10 reps  Hip Adductors and Hamstring Stretch with Strap - 2 x daily - 7 x weekly - 1 sets - 2 reps - 20 seconds hold  Hooklying Hamstring Stretch with Strap - 2 x daily - 7 x weekly - 1 sets - 2 reps - 20 seconds hold  Seated Table Piriformis Stretch - 2 x daily - 7 x weekly - 1 sets - 2 reps - 20 seconds hold

## 2019-10-17 ENCOUNTER — Other Ambulatory Visit: Payer: Self-pay

## 2019-10-17 ENCOUNTER — Encounter: Payer: Self-pay | Admitting: Rehabilitative and Restorative Service Providers"

## 2019-10-17 ENCOUNTER — Ambulatory Visit (INDEPENDENT_AMBULATORY_CARE_PROVIDER_SITE_OTHER): Payer: PPO | Admitting: Rehabilitative and Restorative Service Providers"

## 2019-10-17 DIAGNOSIS — M5440 Lumbago with sciatica, unspecified side: Secondary | ICD-10-CM

## 2019-10-17 DIAGNOSIS — M6281 Muscle weakness (generalized): Secondary | ICD-10-CM

## 2019-10-17 DIAGNOSIS — R293 Abnormal posture: Secondary | ICD-10-CM

## 2019-10-17 NOTE — Therapy (Signed)
[place], [place], [address] Phone: [phone]   Fax:  [phone]  Physical Therapy Treatment  Patient Details  Name: Emily [name] MRN: [id number] Date of Birth: November 20, 1950 Referring Provider (PT): Aundria Mems, MD   Encounter Date: 10/17/2019  PT End of Session - 10/17/19 1015    Visit Number  3    Number of Visits  12    Date for PT Re-Evaluation  11/19/19    PT Start Time  1010    PT Stop Time  1100    PT Time Calculation (min)  50 min       Past Medical History:  Diagnosis Date  . Allergy    seasonal   . Anxiety   . Cancer ([place]) 2009   squamous cell skin  . Cataract    bilateral surgery, left unsuccessful  . Depression   . Eating disorder   . GERD (gastroesophageal reflux disease)   . Glaucoma   . Hiatal hernia   . History of cardiovascular stress test 04/2000   cardiolyte  . History of flexible sigmoidoscopy 09/21/1999    Past Surgical History:  Procedure Laterality Date  . ABDOMINAL HYSTERECTOMY    . APPENDECTOMY    . CARDIAC CATHETERIZATION  04/2000  . CARDIAC CATHETERIZATION    . COLONOSCOPY  12/31/09  . UPPER GASTROINTESTINAL ENDOSCOPY  09/15/1999  . WISDOM TOOTH EXTRACTION      There were no vitals filed for this visit.  Subjective Assessment - 10/17/19 1011    Subjective  The patient reports her pain in the right low back is worse for the last couple of days.  "Yesterday when I get home I could hardly walk."    Patient Stated Goals  To be able to walk with as little pain as possible.    Currently in Pain?  Yes    Pain Score  7     Pain Location  Back    Pain Orientation  Right;Lower    Pain Descriptors / Indicators  Sore;Tightness    Pain Type  Chronic pain    Pain Onset  More than a month ago    Pain Frequency  Constant    Aggravating Factors   worse in the morning and with prolonged standing    Pain Relieving Factors  ice, some exercises, medicine                        [facility] Adult PT Treatment/Exercise - 10/17/19 1015      Exercises   Exercises  Lumbar      Lumbar Exercises: Stretches   Passive Hamstring Stretch  Right;Left;1 rep;30 seconds    Single Knee to Chest Stretch  Right;Left;1 rep;60 seconds    Lower Trunk Rotation  1 rep;60 seconds    Quad Stretch  Right;Left;2 reps;30 seconds    Quad Stretch Limitations  prone PROM    ITB Stretch  Right;1 rep;30 seconds    Figure 4 Stretch  1 rep;30 seconds;Supine    Other Lumbar Stretch Exercise  Supine LEs supported on ball for relaxation with gentle lumbar rotation      Lumbar Exercises: Aerobic   UBE (Upper Arm Bike)  1.5 minutes forward, 1.5 minutes backward at L2      Lumbar Exercises: Supine   Ab Set  5 reps;5 seconds    AB Set Limitations  with legs supported on the ball    Clam  5  reps    Bent Knee Raise  5 reps    Bent Knee Raise Limitations  from physioball    Other Supine Lumbar Exercises  ball press supine with both legs on ball    Other Supine Lumbar Exercises  alternating leg lifts off physioball x 5 reps R and L      Lumbar Exercises: Sidelying   Clam  10 reps;Right      Lumbar Exercises: Prone   Straight Leg Raise  5 reps    Other Prone Lumbar Exercises  Prone hip abduction (figure 4 position) with R leg off edge of mat moving into hip flexion      Modalities   Modalities  Electrical Stimulation;Moist Heat      Moist Heat Therapy   Number Minutes Moist Heat  12 Minutes    Moist Heat Location  Lumbar Spine      Electrical Stimulation   Electrical Stimulation Location  R low back/glut region    Electrical Stimulation Action  IFC    Electrical Stimulation Parameters  intensity to tolerance x 12 minutes    Electrical Stimulation Goals  Pain      Manual Therapy   Manual Therapy  Joint mobilization;Soft tissue mobilization;Muscle Energy Technique    Manual therapy comments  Prone and sidelying    Joint Mobilization  Prone sacral PA  mobilization on R side grade II    Soft tissue mobilization  R IT Band, R sacral border musculature    Muscle Energy Technique  SI technique co-contraction R hamstrings and L hip flexors                  PT Long Term Goals - 10/08/19 1151      PT LONG TERM GOAL #1   Title  The patient will be indep with HEP.    Time  6    Period  Weeks    Target Date  11/19/19      PT LONG TERM GOAL #2   Title  The patient will reduce functional limitation per FOTO from 47% to < or equal to 36%.    Time  6    Period  Weeks    Target Date  11/19/19      PT LONG TERM GOAL #3   Title  The patient will report resting pain < or equal to 2/10.    Baseline  5/10    Time  6    Period  Weeks    Target Date  11/19/19      PT LONG TERM GOAL #4   Title  The patient will report no pain in LEs with ambulation x 300 ft.    Baseline  notes pain R LE today with antalgic pattern    Time  6    Period  Weeks    Target Date  11/19/19      PT LONG TERM GOAL #5   Title  The patient will returnt o home walking program x 20 minutes.    Time  6    Period  Weeks    Target Date  11/19/19            Plan - 10/17/19 1016    Clinical Impression Statement  The patient has reduced pain from 7/10 down to 0/10 during therapy, however she notes weakness sensation in R hip with ambulation.  Due to increased pain in the past 2 days, PT recommended we hold current HEP without progression at this time.  Plan to progress strengthening, flexibility and core stability to patient tolerance.    Examination-Activity Limitations  --    Stability/Clinical Decision Making  Stable/Uncomplicated    Rehab Potential  Good    PT Frequency  2x / week    PT Duration  6 weeks    PT Treatment/Interventions  ADLs/Self Care Home Management;Gait training;Stair training;Functional mobility training;Therapeutic activities;Therapeutic exercise;Manual techniques;Taping;Passive range of motion;Dry needling;Patient/family  education;Cryotherapy;Electrical Stimulation;Moist Heat;Traction;Ultrasound    PT Next Visit Plan  progress strengthening, work on Economist in standing, goal is to return to walking program (initiate program to tolerance). modalities as needed for pain.    PT Home Exercise Plan  Access Code: [email or web address]    Consulted and Agree with Plan of Care  Patient       Patient will benefit from skilled therapeutic intervention in order to improve the following deficits and impairments:  Pain, Hypomobility, Postural dysfunction, Decreased strength, Decreased range of motion, Impaired flexibility, Improper body mechanics, Decreased activity tolerance, Decreased knowledge of precautions  Visit Diagnosis: Acute bilateral low back pain with sciatica, sciatica laterality unspecified  Muscle weakness (generalized)  Abnormal posture     Problem List Patient Active Problem List   Diagnosis Date Noted  . Lumbar spondylosis 09/25/2019  . Right knee injury 08/14/2019  . Primary osteoarthritis of both knees 08/14/2019  . Head injury 07/23/2018  . Hepatic steatosis 10/23/2016  . Ankle sprain 05/18/2011  . Chest wall soft tissue injury 05/18/2011  . Sprain of foot, left 05/18/2011  . VIRAL URI 12/01/2009  . ARTHRALGIA 11/10/2009  . WEIGHT GAIN 11/10/2009  . DYSPNEA 11/10/2009  . INGROWN NAIL 05/19/2009  . DEPRESSION 09/07/2008  . FOOT PAIN, BILATERAL 09/07/2008    [place] , PT 10/17/2019, 10:56 AM  [facility] [place], [place], [address] Phone: [phone]   Fax:  [phone]  Name: Emily [name] MRN: [id number] Date of Birth: Jan 15, 1951

## 2019-10-20 ENCOUNTER — Other Ambulatory Visit: Payer: Self-pay

## 2019-10-20 ENCOUNTER — Ambulatory Visit: Payer: PPO | Admitting: Physical Therapy

## 2019-10-20 DIAGNOSIS — M6281 Muscle weakness (generalized): Secondary | ICD-10-CM

## 2019-10-20 DIAGNOSIS — M5440 Lumbago with sciatica, unspecified side: Secondary | ICD-10-CM

## 2019-10-20 DIAGNOSIS — R293 Abnormal posture: Secondary | ICD-10-CM | POA: Diagnosis not present

## 2019-10-20 NOTE — Therapy (Signed)
Emily [name] [place] [name], [place], [address] Phone: [phone]   Fax:  [phone]  Physical Therapy Treatment  Patient Details  Name: Emily [name] MRN: [id number] Date of Birth: [birth year] Referring Provider (PT): Aundria Mems, MD   Encounter Date: 10/20/2019  PT End of Session - 10/20/19 0942    Visit Number  4    Number of Visits  12    Date for PT Re-Evaluation  11/19/19    PT Start Time  0936    PT Stop Time  1024    PT Time Calculation (min)  48 min       Past Medical History:  Diagnosis Date  . Allergy    seasonal   . Anxiety   . Cancer ([place]) 2009   squamous cell skin  . Cataract    bilateral surgery, left unsuccessful  . Depression   . Eating disorder   . GERD (gastroesophageal reflux disease)   . Glaucoma   . Hiatal hernia   . History of cardiovascular stress test 04/2000   cardiolyte  . History of flexible sigmoidoscopy 09/21/1999    Past Surgical History:  Procedure Laterality Date  . ABDOMINAL HYSTERECTOMY    . APPENDECTOMY    . CARDIAC CATHETERIZATION  04/2000  . CARDIAC CATHETERIZATION    . COLONOSCOPY  12/31/09  . UPPER GASTROINTESTINAL ENDOSCOPY  09/15/1999  . WISDOM TOOTH EXTRACTION      There were no vitals filed for this visit.  Subjective Assessment - 10/20/19 0949    Subjective  Pt reports the relief after last session lasted until afternoon, then tightness returned. She is trying to get up from chair more often during day,.    Patient Stated Goals  To be able to walk with as little pain as possible.    Currently in Pain?  Yes    Pain Score  3     Pain Location  [place]    Pain Orientation  Right    Pain Descriptors / Indicators  Tightness    Pain Onset  More than a month ago    Aggravating Factors   prolonged sitting, worse in AM         [facility] PT Assessment - 10/20/19 0001      Assessment   Medical Diagnosis  lumbar spondylosis    Referring Provider  (PT)  Aundria Mems, MD    Onset Date/Surgical Date  --   one year, worsening over past month   Hand Dominance  Right    Prior Therapy  none      Palpation   Palpation comment  Rt ASIS lower than Lt        [facility] Adult PT Treatment/Exercise - 10/20/19 0001      Self-Care   Self-Care  Other Self-Care Comments    Other Self-Care Comments   pt educated on self massage to hip and lumbar musculature - pt returned demo with cues.       Lumbar Exercises: Stretches   Passive Hamstring Stretch  Right;Left;2 reps;20 seconds   supine with strap, opp knee bent.    Lower Trunk Rotation  3 reps;10 seconds    Piriformis Stretch  Right;2 reps;20 seconds    Piriformis Stretch Limitations  supine with hands assisting LE towards chest; improved tolerance     Figure 4 Stretch  2 reps;10 seconds      Lumbar Exercises: Aerobic   Tread Mill  1.0-1.8 mph x 4 min (pt dizzy  upon stopping, resolved within moments)      Lumbar Exercises: Supine   Bridge  10 reps;3 seconds      Lumbar Exercises: Prone   Straight Leg Raise  5 reps   2 sets, RLE   Straight Leg Raises Limitations  3 reps with LLE, increased pain in Rt SI jt.       Moist Heat Therapy   Number Minutes Moist Heat  10 Minutes    Moist Heat Location  Lumbar Spine      Electrical Stimulation   Electrical Stimulation Location  R low back/glut region    Electrical Stimulation Action  IFC    Electrical Stimulation Parameters  intensity to tolerance     Electrical Stimulation Goals  Pain      Manual Therapy   Muscle Energy Technique  SI technique co-contraction R hamstrings and L hip flexors                  PT Long Term Goals - 10/08/19 1151      PT LONG TERM GOAL #1   Title  The patient will be indep with HEP.    Time  6    Period  Weeks    Target Date  11/19/19      PT LONG TERM GOAL #2   Title  The patient will reduce functional limitation per FOTO from 47% to < or equal to 36%.    Time  6    Period  Weeks     Target Date  11/19/19      PT LONG TERM GOAL #3   Title  The patient will report resting pain < or equal to 2/10.    Baseline  5/10    Time  6    Period  Weeks    Target Date  11/19/19      PT LONG TERM GOAL #4   Title  The patient will report no pain in LEs with ambulation x 300 ft.    Baseline  notes pain R LE today with antalgic pattern    Time  6    Period  Weeks    Target Date  11/19/19      PT LONG TERM GOAL #5   Title  The patient will returnt o home walking program x 20 minutes.    Time  6    Period  Weeks    Target Date  11/19/19            Plan - 10/20/19 1020    Clinical Impression Statement  Pt presents with pelvis asymmetries, Rt inominate appears ant rotated.  She tolerated exercises without increase in pain.  She reports with relief with gentle LTR during session, as well as estim/MHP at end of session.  She had some Rt hamstring cramping a completion of session; eased with hamstring stretch.  Goals are ongoing.    Stability/Clinical Decision Making  Stable/Uncomplicated    Rehab Potential  Good    PT Frequency  2x / week    PT Duration  6 weeks    PT Treatment/Interventions  ADLs/Self Care Home Management;Gait training;Stair training;Functional mobility training;Therapeutic activities;Therapeutic exercise;Manual techniques;Taping;Passive range of motion;Dry needling;Patient/family education;Cryotherapy;Electrical Stimulation;Moist Heat;Traction;Ultrasound    PT Next Visit Plan  progress strengthening, work on Economist in standing, goal is to return to walking program (initiate program to tolerance). modalities as needed for pain.    PT Home Exercise Plan  Access Code: [email or web address] and Agree  with Plan of Care  Patient       Patient will benefit from skilled therapeutic intervention in order to improve the following deficits and impairments:  Pain, Hypomobility, Postural dysfunction, Decreased strength, Decreased range of motion, Impaired  flexibility, Improper body mechanics, Decreased activity tolerance, Decreased knowledge of precautions  Visit Diagnosis: Acute bilateral low back pain with sciatica, sciatica laterality unspecified  Muscle weakness (generalized)  Abnormal posture     Problem List Patient Active Problem List   Diagnosis Date Noted  . Lumbar spondylosis 09/25/2019  . Right knee injury 08/14/2019  . Primary osteoarthritis of both knees 08/14/2019  . Head injury 07/23/2018  . Hepatic steatosis 10/23/2016  . Ankle sprain 05/18/2011  . Chest wall soft tissue injury 05/18/2011  . Sprain of foot, left 05/18/2011  . VIRAL URI 12/01/2009  . ARTHRALGIA 11/10/2009  . WEIGHT GAIN 11/10/2009  . DYSPNEA 11/10/2009  . INGROWN NAIL 05/19/2009  . DEPRESSION 09/07/2008  . FOOT PAIN, BILATERAL 09/07/2008   Kerin Perna, PTA 10/20/19 10:54 AM  [place], [place], [address] Phone: [phone]   Fax:  [phone]  Name: KARLEEN WAXLER MRN: [id number] Date of Birth: [birth year]

## 2019-10-21 DIAGNOSIS — H40053 Ocular hypertension, bilateral: Secondary | ICD-10-CM | POA: Diagnosis not present

## 2019-10-23 ENCOUNTER — Other Ambulatory Visit: Payer: Self-pay

## 2019-10-23 ENCOUNTER — Encounter: Payer: Self-pay | Admitting: Physical Therapy

## 2019-10-23 ENCOUNTER — Ambulatory Visit: Payer: PPO | Admitting: Physical Therapy

## 2019-10-23 DIAGNOSIS — M5440 Lumbago with sciatica, unspecified side: Secondary | ICD-10-CM

## 2019-10-23 DIAGNOSIS — M6281 Muscle weakness (generalized): Secondary | ICD-10-CM | POA: Diagnosis not present

## 2019-10-23 DIAGNOSIS — R293 Abnormal posture: Secondary | ICD-10-CM

## 2019-10-23 NOTE — Therapy (Signed)
[place], [place], [address] Phone: [phone]   Fax:  [phone]  Physical Therapy Treatment  Patient Details  Name: Emily [name] MRN: [id number] Date of Birth: May 19, 1951 Referring Provider (PT): Aundria Mems, MD   Encounter Date: 10/23/2019  PT End of Session - 10/23/19 1109    Visit Number  5    Number of Visits  12    Date for PT Re-Evaluation  11/19/19    PT Start Time  1103    PT Stop Time  1151    PT Time Calculation (min)  48 min    Activity Tolerance  Patient tolerated treatment well       Past Medical History:  Diagnosis Date  . Allergy    seasonal   . Anxiety   . Cancer ([place]) 2009   squamous cell skin  . Cataract    bilateral surgery, left unsuccessful  . Depression   . Eating disorder   . GERD (gastroesophageal reflux disease)   . Glaucoma   . Hiatal hernia   . History of cardiovascular stress test 04/2000   cardiolyte  . History of flexible sigmoidoscopy 09/21/1999    Past Surgical History:  Procedure Laterality Date  . ABDOMINAL HYSTERECTOMY    . APPENDECTOMY    . CARDIAC CATHETERIZATION  04/2000  . CARDIAC CATHETERIZATION    . COLONOSCOPY  12/31/09  . UPPER GASTROINTESTINAL ENDOSCOPY  09/15/1999  . WISDOM TOOTH EXTRACTION      There were no vitals filed for this visit.  Subjective Assessment - 10/23/19 1106    Subjective  "I think it's getting better".  Pt hasn't gotten a ball for self massage, but has been walking more at work.  She states stairs continue to be difficult and take time to ascend; descending is easier than going up.    Patient Stated Goals  To be able to walk with as little pain as possible.    Currently in Pain?  Yes    Pain Score  4     Pain Location  Back    Pain Orientation  Right    Pain Descriptors / Indicators  Tightness    Pain Onset  More than a month ago    Aggravating Factors   prolonged sitting    Pain Relieving Factors   massaging areaPt         [facility] PT Assessment - 10/23/19 0001      Assessment   Medical Diagnosis  lumbar spondylosis    Referring Provider (PT)  Aundria Mems, MD    Onset Date/Surgical Date  --   one year, worsening over past month   Hand Dominance  Right    Prior Therapy  none      [facility] Adult PT Treatment/Exercise - 10/23/19 0001      Lumbar Exercises: Stretches   Passive Hamstring Stretch  Right;Left;2 reps;30 seconds    Lower Trunk Rotation  10 seconds;4 reps    Hip Flexor Stretch  Right;Left;2 reps;30 seconds   seated with leg off of chair.    Quad Stretch  Right;Left;2 reps;30 seconds    Quad Stretch Limitations  seated, foot under chair.     Piriformis Stretch  Right;Left;2 reps;20 seconds    Gastroc Stretch  2 reps;20 seconds   incline board      Lumbar Exercises: Aerobic   Nustep  L5: 6 min       Lumbar Exercises: Standing   Other Standing  Lumbar Exercises  mini squats x 10 reps with core engaged.  Sit to stand x 3 reps with cues to control descent (challenging)  4+6" steps (5 stairs) - reciprocal pattern with BUE on rail x 2 reps- pt reported some knee pain.      Lumbar Exercises: Supine   Dead Bug  10 reps;2 seconds   bent knee raise with opp arm, cues for core engagmt   Bridge  5 reps;3 seconds      Moist Heat Therapy   Number Minutes Moist Heat  10 Minutes    Moist Heat Location  Lumbar Spine      Electrical Stimulation   Electrical Stimulation Location  bilat SI joint and bilat lumbar musculature    Electrical Stimulation Action  IFC    Electrical Stimulation Parameters  intensity to tolerance x 10 min     Electrical Stimulation Goals  Pain      Manual Therapy   Manual therapy comments  I strips of sensitive skin Rock tape applied to medial Lt knee to decompress tissue, increase proprioception, and decrease pain.                   PT Long Term Goals - 10/08/19 1151      PT LONG TERM GOAL #1   Title  The patient will be indep  with HEP.    Time  6    Period  Weeks    Target Date  11/19/19      PT LONG TERM GOAL #2   Title  The patient will reduce functional limitation per FOTO from 47% to < or equal to 36%.    Time  6    Period  Weeks    Target Date  11/19/19      PT LONG TERM GOAL #3   Title  The patient will report resting pain < or equal to 2/10.    Baseline  5/10    Time  6    Period  Weeks    Target Date  11/19/19      PT LONG TERM GOAL #4   Title  The patient will report no pain in LEs with ambulation x 300 ft.    Baseline  notes pain R LE today with antalgic pattern    Time  6    Period  Weeks    Target Date  11/19/19      PT LONG TERM GOAL #5   Title  The patient will returnt o home walking program x 20 minutes.    Time  6    Period  Weeks    Target Date  11/19/19            Plan - 10/23/19 1212    Clinical Impression Statement  Pt reporting overall improvement in pain in low back.  Bilat quads/hip flexors remain tight; trialed seated quad stretch today.  Pt reported her LEs "felt like jello" at completion of session with exiting the clinic; advised her to dial back stretches when completing at home.  Pt progressing gradually towards goals.    Stability/Clinical Decision Making  Stable/Uncomplicated    Rehab Potential  Good    PT Frequency  2x / week    PT Duration  6 weeks    PT Treatment/Interventions  ADLs/Self Care Home Management;Gait training;Stair training;Functional mobility training;Therapeutic activities;Therapeutic exercise;Manual techniques;Taping;Passive range of motion;Dry needling;Patient/family education;Cryotherapy;Electrical Stimulation;Moist Heat;Traction;Ultrasound    PT Next Visit Plan  assess response to tape on knee;  trial over low back.  continue body mechanics, back care education, and postural strengthening. modalities as indicate.    PT Home Exercise Plan  Access Code: [email or web address]    Consulted and Agree with Plan of Care  Patient       Patient will benefit  from skilled therapeutic intervention in order to improve the following deficits and impairments:  Pain, Hypomobility, Postural dysfunction, Decreased strength, Decreased range of motion, Impaired flexibility, Improper body mechanics, Decreased activity tolerance, Decreased knowledge of precautions  Visit Diagnosis: Acute bilateral low back pain with sciatica, sciatica laterality unspecified  Muscle weakness (generalized)  Abnormal posture     Problem List Patient Active Problem List   Diagnosis Date Noted  . Lumbar spondylosis 09/25/2019  . Right knee injury 08/14/2019  . Primary osteoarthritis of both knees 08/14/2019  . Head injury 07/23/2018  . Hepatic steatosis 10/23/2016  . Ankle sprain 05/18/2011  . Chest wall soft tissue injury 05/18/2011  . Sprain of foot, left 05/18/2011  . VIRAL URI 12/01/2009  . ARTHRALGIA 11/10/2009  . WEIGHT GAIN 11/10/2009  . DYSPNEA 11/10/2009  . INGROWN NAIL 05/19/2009  . DEPRESSION 09/07/2008  . FOOT PAIN, BILATERAL 09/07/2008   Kerin Perna, PTA 10/23/19 12:19 PM  [facility] [place], [place], [address] Phone: [phone]   Fax:  [phone]  Name: Emily [name] MRN: [id number] Date of Birth: 04-09-1951

## 2019-10-29 ENCOUNTER — Ambulatory Visit (INDEPENDENT_AMBULATORY_CARE_PROVIDER_SITE_OTHER): Payer: PPO | Admitting: Rehabilitative and Restorative Service Providers"

## 2019-10-29 ENCOUNTER — Other Ambulatory Visit: Payer: Self-pay

## 2019-10-29 ENCOUNTER — Encounter: Payer: Self-pay | Admitting: Rehabilitative and Restorative Service Providers"

## 2019-10-29 ENCOUNTER — Ambulatory Visit: Payer: PPO | Attending: Internal Medicine

## 2019-10-29 DIAGNOSIS — M6281 Muscle weakness (generalized): Secondary | ICD-10-CM | POA: Diagnosis not present

## 2019-10-29 DIAGNOSIS — R293 Abnormal posture: Secondary | ICD-10-CM | POA: Diagnosis not present

## 2019-10-29 DIAGNOSIS — Z23 Encounter for immunization: Secondary | ICD-10-CM

## 2019-10-29 DIAGNOSIS — M5440 Lumbago with sciatica, unspecified side: Secondary | ICD-10-CM | POA: Diagnosis not present

## 2019-10-29 NOTE — Therapy (Signed)
[place], [place], [address] Phone: [phone]   Fax:  [phone]  Physical Therapy Treatment  Patient Details  Name: Emily [name] MRN: [id number] Date of Birth: [birth year] Referring Provider (PT): Aundria Mems, MD   Encounter Date: 10/29/2019  PT End of Session - 10/29/19 0939    Visit Number  6    Number of Visits  12    Date for PT Re-Evaluation  11/19/19    PT Start Time  0934    PT Stop Time  1025    PT Time Calculation (min)  51 min    Activity Tolerance  Patient tolerated treatment well       Past Medical History:  Diagnosis Date  . Allergy    seasonal   . Anxiety   . Cancer ([place]) 2009   squamous cell skin  . Cataract    bilateral surgery, left unsuccessful  . Depression   . Eating disorder   . GERD (gastroesophageal reflux disease)   . Glaucoma   . Hiatal hernia   . History of cardiovascular stress test 04/2000   cardiolyte  . History of flexible sigmoidoscopy 09/21/1999    Past Surgical History:  Procedure Laterality Date  . ABDOMINAL HYSTERECTOMY    . APPENDECTOMY    . CARDIAC CATHETERIZATION  04/2000  . CARDIAC CATHETERIZATION    . COLONOSCOPY  12/31/09  . UPPER GASTROINTESTINAL ENDOSCOPY  09/15/1999  . WISDOM TOOTH EXTRACTION      There were no vitals filed for this visit.  Subjective Assessment - 10/29/19 0937    Subjective  The patient reports she has 2/10 pain in SI region described as sore.  She is off work this week and has been laying around more and notes low energy.    Patient Stated Goals  To be able to walk with as little pain as possible.    Currently in Pain?  Yes    Pain Score  2     Pain Location  Back    Pain Orientation  Lower    Pain Descriptors / Indicators  Sore    Pain Type  Chronic pain    Pain Onset  More than a month ago    Pain Frequency  Constant    Aggravating Factors   prolonged sitting    Pain Relieving Factors  walking  helps                        [facility] Adult PT Treatment/Exercise - 10/29/19 0940      Exercises   Exercises  Lumbar;Knee/Hip      Lumbar Exercises: Stretches   Passive Hamstring Stretch  Right;Left;2 reps;30 seconds    Lower Trunk Rotation  10 seconds;4 reps    Gastroc Stretch  Right;Left;2 reps;20 seconds    Other Lumbar Stretch Exercise  *patient gets hip flexor stretch contralateral side during passive HS strength supine      Lumbar Exercises: Standing   Wall Slides Limitations  *attempted and painful to perform, switched to wall bumps x 10 reps to emphasize glut set      Lumbar Exercises: Supine   Other Supine Lumbar Exercises  physioball rocking  for lumbar mobility;  bent knee fallouts x 10 reps supine    Other Supine Lumbar Exercises  physioball hip flexion/extension for core engagement      Lumbar Exercises: Sidelying   Hip Abduction  10 reps    Hip  Abduction Limitations  with foot supported on boulster    Other Sidelying Lumbar Exercises  hip hike and depression with foot supported on the boulster      Moist Heat Therapy   Number Minutes Moist Heat  10 Minutes    Moist Heat Location  Lumbar Spine      Electrical Stimulation   Electrical Stimulation Location  bilat SI joint and bilat lumbar musculature    Electrical Stimulation Action  IFC    Electrical Stimulation Parameters  intensity to tolerance x 10 minutes    Electrical Stimulation Goals  Pain                  PT Long Term Goals - 10/08/19 1151      PT LONG TERM GOAL #1   Title  The patient will be indep with HEP.    Time  6    Period  Weeks    Target Date  11/19/19      PT LONG TERM GOAL #2   Title  The patient will reduce functional limitation per FOTO from 47% to < or equal to 36%.    Time  6    Period  Weeks    Target Date  11/19/19      PT LONG TERM GOAL #3   Title  The patient will report resting pain < or equal to 2/10.    Baseline  5/10    Time  6    Period  Weeks     Target Date  11/19/19      PT LONG TERM GOAL #4   Title  The patient will report no pain in LEs with ambulation x 300 ft.    Baseline  notes pain R LE today with antalgic pattern    Time  6    Period  Weeks    Target Date  11/19/19      PT LONG TERM GOAL #5   Title  The patient will returnt o home walking program x 20 minutes.    Time  6    Period  Weeks    Target Date  11/19/19            Plan - 10/29/19 1259    Clinical Impression Statement  The patient tolerated exercise well today noting reduced pain with movement.  She cites long work hours as a barrier to performing daily exercise.  PT encouraged more movement on shorter work days to increase mobility.    Stability/Clinical Decision Making  Stable/Uncomplicated    Rehab Potential  Good    PT Frequency  2x / week    PT Duration  6 weeks    PT Treatment/Interventions  ADLs/Self Care Home Management;Gait training;Stair training;Functional mobility training;Therapeutic activities;Therapeutic exercise;Manual techniques;Taping;Passive range of motion;Dry needling;Patient/family education;Cryotherapy;Electrical Stimulation;Moist Heat;Traction;Ultrasound    PT Next Visit Plan  assess response to tape on knee; trial over low back.  continue body mechanics, back care education, and postural strengthening. modalities as indicate.    PT Home Exercise Plan  Access Code: [email or web address]    Consulted and Agree with Plan of Care  Patient       Patient will benefit from skilled therapeutic intervention in order to improve the following deficits and impairments:  Pain, Hypomobility, Postural dysfunction, Decreased strength, Decreased range of motion, Impaired flexibility, Improper body mechanics, Decreased activity tolerance, Decreased knowledge of precautions  Visit Diagnosis: Muscle weakness (generalized)  Acute bilateral low back pain with sciatica, sciatica laterality unspecified  Abnormal  posture     Problem List Patient Active  Problem List   Diagnosis Date Noted  . Lumbar spondylosis 09/25/2019  . Right knee injury 08/14/2019  . Primary osteoarthritis of both knees 08/14/2019  . Head injury 07/23/2018  . Hepatic steatosis 10/23/2016  . Ankle sprain 05/18/2011  . Chest wall soft tissue injury 05/18/2011  . Sprain of foot, left 05/18/2011  . VIRAL URI 12/01/2009  . ARTHRALGIA 11/10/2009  . WEIGHT GAIN 11/10/2009  . DYSPNEA 11/10/2009  . INGROWN NAIL 05/19/2009  . DEPRESSION 09/07/2008  . FOOT PAIN, BILATERAL 09/07/2008    Alireza Pollack, PT 10/29/2019, 1:02 PM  [facility] [place], [place], [address] Phone: [phone]   Fax:  [phone]  Name: FRANCELY CHANA MRN: [id number] Date of Birth: [birth year]

## 2019-10-29 NOTE — Progress Notes (Signed)
   Covid-19 Vaccination Clinic  Name:  Emily [name]    MRN: [id number] DOB: Aug 26, 1950  10/29/2019  [name] was observed post Covid-19 immunization for 15 minutes without incident. She was provided with Vaccine Information Sheet and instruction to access the V-Safe system.   [name] was instructed to call 911 with any severe reactions post vaccine: [name] Difficulty breathing  . Swelling of face and throat  . A fast heartbeat  . A bad rash all over body  . Dizziness and weakness   Immunizations Administered    Name Date Dose VIS Date Route   Pfizer COVID-19 Vaccine 10/29/2019  8:13 AM 0.3 mL 08/13/2018 Intramuscular   Manufacturer: [place]   Lot: [id number]   [place]: [id number]

## 2019-11-04 ENCOUNTER — Other Ambulatory Visit: Payer: Self-pay | Admitting: Family Medicine

## 2019-11-07 ENCOUNTER — Encounter: Payer: PPO | Admitting: Rehabilitative and Restorative Service Providers"

## 2019-11-13 ENCOUNTER — Other Ambulatory Visit: Payer: Self-pay

## 2019-11-13 ENCOUNTER — Ambulatory Visit (INDEPENDENT_AMBULATORY_CARE_PROVIDER_SITE_OTHER): Payer: PPO | Admitting: Physical Therapy

## 2019-11-13 DIAGNOSIS — R293 Abnormal posture: Secondary | ICD-10-CM

## 2019-11-13 DIAGNOSIS — M6281 Muscle weakness (generalized): Secondary | ICD-10-CM | POA: Diagnosis not present

## 2019-11-13 DIAGNOSIS — M5440 Lumbago with sciatica, unspecified side: Secondary | ICD-10-CM

## 2019-11-13 NOTE — Patient Instructions (Signed)
TENS UNIT: This is helpful for muscle pain and spasm.   Search and Purchase a TENS 7000 2nd edition at [employer]. It should be less than $30.     TENS unit instructions: Do not shower or bathe with the unit on Turn the unit off before removing electrodes or batteries If the electrodes lose stickiness add a drop of water to the electrodes after they are disconnected from the unit and place on plastic sheet. If you continued to have difficulty, call the TENS unit company to purchase more electrodes. Do not apply lotion on the skin area prior to use. Make sure the skin is clean and dry as this will help prolong the life of the electrodes. After use, always check skin for unusual red areas, rash or other skin difficulties. If there are any skin problems, does not apply electrodes to the same area. Never remove the electrodes from the unit by pulling the wires. Do not use the TENS unit or electrodes other than as directed. Do not change electrode placement without consultating your therapist or physician. Keep 2 fingers with between each electrode. Wear time ratio is 2:1, on to off times.    For example on for 30 minutes off for 15 minutes and then on for 30 minutes off for 15 minutes   Bridge    Lie back, legs bent. Inhale, pressing hips up. Keeping ribs in, lengthen lower back. Exhale, rolling down along spine from top. Repeat _5-10__ times. Do _1___ sessions per day.

## 2019-11-13 NOTE — Therapy (Addendum)
[place] [address] [place], [place], [address] Phone: [phone]   Fax:  [phone]  Physical Therapy Treatment and Discharge Summary  Patient Details  Name: Emily [name] MRN: [id number] Date of Birth: [birth year] Referring Provider (PT): Aundria Mems, MD   Encounter Date: 11/13/2019  PT End of Session - 11/13/19 0851    Visit Number  7    Number of Visits  12    Date for PT Re-Evaluation  11/19/19    PT Start Time  5284    PT Stop Time  0926    PT Time Calculation (min)  39 min    Activity Tolerance  Patient tolerated treatment well;No increased pain    Behavior During Therapy  WFL for tasks assessed/performed       Past Medical History:  Diagnosis Date  . Allergy    seasonal   . Anxiety   . Cancer ([place]) 2009   squamous cell skin  . Cataract    bilateral surgery, left unsuccessful  . Depression   . Eating disorder   . GERD (gastroesophageal reflux disease)   . Glaucoma   . Hiatal hernia   . History of cardiovascular stress test 04/2000   cardiolyte  . History of flexible sigmoidoscopy 09/21/1999    Past Surgical History:  Procedure Laterality Date  . ABDOMINAL HYSTERECTOMY    . APPENDECTOMY    . CARDIAC CATHETERIZATION  04/2000  . CARDIAC CATHETERIZATION    . COLONOSCOPY  12/31/09  . UPPER GASTROINTESTINAL ENDOSCOPY  09/15/1999  . WISDOM TOOTH EXTRACTION      There were no vitals filed for this visit.  Subjective Assessment - 11/13/19 0851    Subjective  Pt reports her knees have been bothering her more lately.  Lower back has been better, "nothing I can't handle".  She was vacuuming yesterday and this irritated her back; resolved with gentle stretches at her desk.    Patient Stated Goals  To be able to walk with as little pain as possible.    Currently in Pain?  No/denies    Pain Score  0-No pain         [facility] PT Assessment - 11/13/19 0001      Assessment   Medical Diagnosis   lumbar spondylosis    Referring Provider (PT)  Aundria Mems, MD    Onset Date/Surgical Date  --   one year, worsening over past month   Hand Dominance  Right    Prior Therapy  none      Observation/Other Assessments   Focus on Therapeutic Outcomes (FOTO)   31% limitation         [facility] Adult PT Treatment/Exercise - 11/13/19 0001      Self-Care   Other Self-Care Comments   pt educated on TENS safety, parameters, and operating of unit ; pt verbalized understanding      Lumbar Exercises: Stretches   Passive Hamstring Stretch  Right;Left;2 reps;30 seconds    Lower Trunk Rotation  10 seconds;4 reps    Hip Flexor Stretch  Right;Left;1 rep;30 seconds   seated in chair   Quad Stretch  Right;Left;2 reps;20 seconds    Quad Stretch Limitations  seated, foot under chair.     Piriformis Stretch  Right;Left;1 rep;30 seconds   modified pigeon   Other Lumbar Stretch Exercise  hip adductor stretch in supine with strap x 20 sec hold      Lumbar Exercises: Aerobic   Nustep  L4-L3: 6  min for warm up.     Other Aerobic Exercise  4 laps around gym (80 ft laps)      Lumbar Exercises: Supine   Bent Knee Raise  10 reps    Dead Bug  10 reps;2 seconds   bent knee raise with opp arm, cues for core engagmt   Bridge  5 reps;3 seconds      Modalities   Modalities  --   declined; pain free      PT Long Term Goals - 11/13/19 0856      PT LONG TERM GOAL #1   Title  The patient will be indep with HEP.    Time  6    Period  Weeks    Status  On-going      PT LONG TERM GOAL #2   Title  The patient will reduce functional limitation per FOTO from 47% to < or equal to 36%.    Time  6    Period  Weeks    Status  Achieved      PT LONG TERM GOAL #3   Title  The patient will report resting pain < or equal to 2/10.    Baseline  --    Time  6    Period  Weeks    Status  Achieved      PT LONG TERM GOAL #4   Title  The patient will report no pain in LEs with ambulation x 300 ft.    Time  6     Period  Weeks    Status  Achieved      PT LONG TERM GOAL #5   Title  The patient will return to home walking program x 20 minutes.    Time  6    Period  Weeks    Status  On-going            Plan - 11/13/19 0932    Clinical Impression Statement  Reviewed HEP; encouraged initiating walking program. Tolerated all exercises without increase in pain.  FOTO score improved.  Pt verbalized readiness to hold therapy while she works on [employer].    Stability/Clinical Decision Making  Stable/Uncomplicated    Rehab Potential  Good    PT Frequency  2x / week    PT Duration  6 weeks    PT Treatment/Interventions  ADLs/Self Care Home Management;Gait training;Stair training;Functional mobility training;Therapeutic activities;Therapeutic exercise;Manual techniques;Taping;Passive range of motion;Dry needling;Patient/family education;Cryotherapy;Electrical Stimulation;Moist Heat;Traction;Ultrasound    PT Next Visit Plan  will hold therapy unitl 5/27; will d/c at that time if pt doens't return.    PT Home Exercise Plan  Access Code: [email or web address]    Consulted and Agree with Plan of Care  Patient       Patient will benefit from skilled therapeutic intervention in order to improve the following deficits and impairments:  Pain, Hypomobility, Postural dysfunction, Decreased strength, Decreased range of motion, Impaired flexibility, Improper body mechanics, Decreased activity tolerance, Decreased knowledge of precautions  Visit Diagnosis: Muscle weakness (generalized)  Acute bilateral low back pain with sciatica, sciatica laterality unspecified  Abnormal posture  PHYSICAL THERAPY DISCHARGE SUMMARY  Visits from Start of Care: 9  Current functional level related to goals / functional outcomes: Goals achieved- see above   Remaining deficits: Patient was on hold to continue to work on HEP.  No further contact with clinic.   Education / Equipment: Home program.  Plan: Patient agrees to discharge.   Patient goals were met. Patient is  being discharged due to meeting the stated rehab goals.  ?????         Thank you for the referral of this patient. Rudell Cobb, MPT  Kerin Perna, [place] 11/13/19 2:02 PM  [facility] [place], [place], [address] Phone: [phone]   Fax:  [phone]  Name: AARIEL EMS MRN: [id number] Date of Birth: 29-Jul-1950

## 2019-11-25 DIAGNOSIS — H5310 Unspecified subjective visual disturbances: Secondary | ICD-10-CM | POA: Diagnosis not present

## 2019-12-11 ENCOUNTER — Other Ambulatory Visit: Payer: Self-pay | Admitting: Family Medicine

## 2019-12-11 DIAGNOSIS — H40053 Ocular hypertension, bilateral: Secondary | ICD-10-CM | POA: Diagnosis not present

## 2019-12-19 ENCOUNTER — Ambulatory Visit (INDEPENDENT_AMBULATORY_CARE_PROVIDER_SITE_OTHER): Payer: PPO | Admitting: Sports Medicine

## 2019-12-19 ENCOUNTER — Other Ambulatory Visit: Payer: Self-pay

## 2019-12-19 DIAGNOSIS — M170 Bilateral primary osteoarthritis of knee: Secondary | ICD-10-CM

## 2019-12-19 NOTE — Assessment & Plan Note (Signed)
Jung returns, she is a very pleasant 69 year old female, approximately 5 months ago we injected both of her knees, she did extremely well for several months, now having recurrence of pain, we injected both knees again today, return as needed.

## 2019-12-19 NOTE — Progress Notes (Signed)
    Procedures performed today:    Procedure: Real-time Ultrasound Guided injection of the left knee Device: Samsung HS60  Verbal informed consent obtained.  Time-out conducted.  Noted no overlying erythema, induration, or other signs of local infection.  Skin prepped in a sterile fashion.  Local anesthesia: Topical Ethyl chloride.  With sterile technique and under real time ultrasound guidance: 1 cc Kenalog 40, 2 cc lidocaine, 2 cc bupivacaine injected easily Completed without difficulty  Pain immediately resolved suggesting accurate placement of the medication.  Advised to call if fevers/chills, erythema, induration, drainage, or persistent bleeding.  Images permanently stored and available for review in the ultrasound unit.  Impression: Technically successful ultrasound guided injection.  Procedure: Real-time Ultrasound Guided injection of the right knee Device: Samsung HS60  Verbal informed consent obtained.  Time-out conducted.  Noted no overlying erythema, induration, or other signs of local infection.  Skin prepped in a sterile fashion.  Local anesthesia: Topical Ethyl chloride.  With sterile technique and under real time ultrasound guidance: 1 cc Kenalog 40, 2 cc lidocaine, 2 cc bupivacaine injected easily Completed without difficulty  Pain immediately resolved suggesting accurate placement of the medication.  Advised to call if fevers/chills, erythema, induration, drainage, or persistent bleeding.  Images permanently stored and available for review in the ultrasound unit.  Impression: Technically successful ultrasound guided injection.  Independent interpretation of notes and tests performed by another provider:   None.  Brief History, Exam, Impression, and Recommendations:    Primary osteoarthritis of both knees Emily [name] returns, she is a very pleasant 69 year old female, approximately 5 months ago we injected both of her knees, she did extremely well for several months,  now having recurrence of pain, we injected both knees again today, return as needed.    ___________________________________________ Gwen Her. Dianah Field, M.D., ABFM., CAQSM. Primary Care and [place] Instructor of [place] [employer] [facility] of Medicine

## 2020-01-07 ENCOUNTER — Other Ambulatory Visit: Payer: Self-pay | Admitting: Family Medicine

## 2020-01-27 DIAGNOSIS — D1801 Hemangioma of skin and subcutaneous tissue: Secondary | ICD-10-CM | POA: Diagnosis not present

## 2020-01-27 DIAGNOSIS — D485 Neoplasm of uncertain behavior of skin: Secondary | ICD-10-CM | POA: Diagnosis not present

## 2020-01-27 DIAGNOSIS — C44619 Basal cell carcinoma of skin of left upper limb, including shoulder: Secondary | ICD-10-CM | POA: Diagnosis not present

## 2020-01-27 DIAGNOSIS — D229 Melanocytic nevi, unspecified: Secondary | ICD-10-CM | POA: Diagnosis not present

## 2020-01-27 DIAGNOSIS — L821 Other seborrheic keratosis: Secondary | ICD-10-CM | POA: Diagnosis not present

## 2020-01-27 DIAGNOSIS — L905 Scar conditions and fibrosis of skin: Secondary | ICD-10-CM | POA: Diagnosis not present

## 2020-01-27 DIAGNOSIS — Z85828 Personal history of other malignant neoplasm of skin: Secondary | ICD-10-CM | POA: Diagnosis not present

## 2020-01-29 ENCOUNTER — Other Ambulatory Visit: Payer: Self-pay | Admitting: Family Medicine

## 2020-01-29 DIAGNOSIS — Z1231 Encounter for screening mammogram for malignant neoplasm of breast: Secondary | ICD-10-CM

## 2020-01-30 ENCOUNTER — Other Ambulatory Visit: Payer: Self-pay

## 2020-01-30 ENCOUNTER — Ambulatory Visit
Admission: RE | Admit: 2020-01-30 | Discharge: 2020-01-30 | Disposition: A | Discharge status: Home / Self Care | Payer: PPO | Source: Ambulatory Visit | Attending: Family Medicine | Admitting: Family Medicine

## 2020-01-30 DIAGNOSIS — Z1231 Encounter for screening mammogram for malignant neoplasm of breast: Secondary | ICD-10-CM | POA: Diagnosis not present

## 2020-02-08 ENCOUNTER — Other Ambulatory Visit: Payer: Self-pay | Admitting: Family Medicine

## 2020-02-09 ENCOUNTER — Other Ambulatory Visit: Payer: Self-pay

## 2020-02-09 ENCOUNTER — Ambulatory Visit (HOSPITAL_COMMUNITY)
Admission: EM | Admit: 2020-02-09 | Discharge: 2020-02-09 | Disposition: A | Discharge status: Home / Self Care | Payer: PPO | Attending: Urgent Care | Admitting: Urgent Care

## 2020-02-09 ENCOUNTER — Encounter (HOSPITAL_COMMUNITY): Payer: Self-pay

## 2020-02-09 DIAGNOSIS — F419 Anxiety disorder, unspecified: Secondary | ICD-10-CM | POA: Insufficient documentation

## 2020-02-09 DIAGNOSIS — R61 Generalized hyperhidrosis: Secondary | ICD-10-CM | POA: Diagnosis not present

## 2020-02-09 DIAGNOSIS — Z87891 Personal history of nicotine dependence: Secondary | ICD-10-CM | POA: Diagnosis not present

## 2020-02-09 DIAGNOSIS — R0602 Shortness of breath: Secondary | ICD-10-CM | POA: Diagnosis not present

## 2020-02-09 DIAGNOSIS — R531 Weakness: Secondary | ICD-10-CM | POA: Diagnosis not present

## 2020-02-09 DIAGNOSIS — R5383 Other fatigue: Secondary | ICD-10-CM | POA: Diagnosis not present

## 2020-02-09 DIAGNOSIS — Z79899 Other long term (current) drug therapy: Secondary | ICD-10-CM | POA: Insufficient documentation

## 2020-02-09 DIAGNOSIS — F329 Major depressive disorder, single episode, unspecified: Secondary | ICD-10-CM | POA: Insufficient documentation

## 2020-02-09 DIAGNOSIS — Z20822 Contact with and (suspected) exposure to covid-19: Secondary | ICD-10-CM | POA: Diagnosis not present

## 2020-02-09 DIAGNOSIS — H409 Unspecified glaucoma: Secondary | ICD-10-CM | POA: Insufficient documentation

## 2020-02-09 NOTE — Telephone Encounter (Signed)
Patient need to schedule an ov for more refills. 

## 2020-02-09 NOTE — ED Triage Notes (Signed)
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 Now only c/o fatigue

## 2020-02-09 NOTE — ED Provider Notes (Signed)
[place]   MRN: [id number] DOB: 1950/12/30  Subjective:   Emily [name] is a 69 y.o. 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  Patient denies history of heart conditions, has had a stress test and was normal.  She has a history of anxiety and uses Xanax for this as needed, takes Pristiq and trazodone.  Uses furosemide as needed.  Has a history of ocular hypertension.  Denies history of hypertension.  Denies history of thyroid disease.  She does have a PCP.  She has been Covid vaccinated.  No current facility-administered medications for this encounter.  Current Outpatient Medications:  .  ALPRAZolam (XANAX) 1 MG tablet, Take 1 mg by mouth 3 (three) times daily as needed. For anxiety., Disp: , Rfl:  .  colchicine 0.6 MG tablet, Take 1 tablet (0.6 mg total) by mouth 2 (two) times daily as needed. For gout flareups., Disp: 60 tablet, Rfl: 3 .  D3-50 50000 units capsule, Take 50,000 Units by mouth once a week., Disp: , Rfl: 12 .  desvenlafaxine (PRISTIQ) 100 MG 24 hr tablet, Take 100 mg by mouth every morning., Disp: , Rfl: 12 .  dorzolamide (TRUSOPT) 2 % ophthalmic solution, Place 1 drop into both eyes 2 (two) times daily., Disp: , Rfl:  .  dorzolamide-timolol (COSOPT) 22.3-6.8 MG/ML ophthalmic solution, Place 1 drop into the right eye 2 (two) times daily., Disp: , Rfl:  .  furosemide (LASIX) 20 MG tablet, TAKE 1 TABLET BY MOUTH EVERY DAY AS NEEDED, Disp: 30 tablet, Rfl: 0 .  latanoprost (XALATAN) 0.005 % ophthalmic solution, Place 1 drop into both eyes at bedtime., Disp: , Rfl:  .  traZODone (DESYREL) 100 MG tablet, Take 300 mg by mouth at bedtime. , Disp: , Rfl:    Allergies  Allergen Reactions  . Doxycycline     Gi upset  . Penicillins     REACTION: rash, aggression Has patient had a PCN reaction causing immediate rash, facial/tongue/throat  swelling, SOB or lightheadedness with hypotension: YES Has patient had a PCN reaction causing severe rash involving mucus membranes or skin necrosis: NO Has patient had a PCN reaction that required hospitalizationNO Has patient had a PCN reaction occurring within the last 10 years:NO If all of the above answers are "NO", then may proceed with Cephalosporin use.   . Prednisone     REACTION: agressive behavior    Past Medical History:  Diagnosis Date  . Allergy    seasonal   . Anxiety   . Cancer ([place]) 2009   squamous cell skin  . Cataract    bilateral surgery, left unsuccessful  . Depression   . Eating disorder   . GERD (gastroesophageal reflux disease)   . Glaucoma   . Hiatal hernia   . History of cardiovascular stress test 04/2000   cardiolyte  . History of flexible sigmoidoscopy 09/21/1999     Past Surgical History:  Procedure Laterality Date  . ABDOMINAL HYSTERECTOMY    . APPENDECTOMY    . CARDIAC CATHETERIZATION  04/2000  . CARDIAC CATHETERIZATION    . COLONOSCOPY  12/31/09  . UPPER GASTROINTESTINAL ENDOSCOPY  09/15/1999  . WISDOM TOOTH EXTRACTION      Family History  Problem Relation Age of Onset  . Arthritis Other   . Cancer Other        lung  . Coronary artery disease Other   .  Stroke Other   . Colon cancer Neg Hx   . Colon polyps Neg Hx   . Esophageal cancer Neg Hx   . Rectal cancer Neg Hx   . Stomach cancer Neg Hx     Social History   Tobacco Use  . Smoking status: Former Smoker    Packs/day: 0.50    Years: 20.00    Pack years: 10.00    Types: Cigarettes    Quit date: 05/27/2008    Years since quitting: 11.7  . Smokeless tobacco: Never Used  Vaping Use  . Vaping Use: Never used  Substance Use Topics  . Alcohol use: Yes    Comment: occ  . Drug use: No    ROS   Objective:   Vitals: BP (!) 143/79   Pulse 70   Temp 98.6 F (37 C)   Resp 18   SpO2 95%   Physical Exam Constitutional:      General: She is not in acute distress.     Appearance: Normal appearance. She is well-developed. She is obese. She is not ill-appearing, toxic-appearing or diaphoretic.  HENT:     Head: Normocephalic and atraumatic.     Nose: Nose normal.     Mouth/Throat:     Mouth: Mucous membranes are moist.  Eyes:     General: No scleral icterus.       Right eye: No discharge.        Left eye: No discharge.     Extraocular Movements: Extraocular movements intact.     Conjunctiva/sclera: Conjunctivae normal.     Pupils: Pupils are equal, round, and reactive to light.  Neck:     Vascular: No carotid bruit.  Cardiovascular:     Rate and Rhythm: Normal rate and regular rhythm.     Pulses: Normal pulses.     Heart sounds: Normal heart sounds. No murmur heard.  No friction rub. No gallop.   Pulmonary:     Effort: Pulmonary effort is normal. No respiratory distress.     Breath sounds: Normal breath sounds. No stridor. No wheezing, rhonchi or rales.  Musculoskeletal:     Cervical back: Normal range of motion and neck supple.  Skin:    General: Skin is warm and dry.     Findings: No rash.  Neurological:     Mental Status: She is alert and oriented to person, place, and time.  Psychiatric:        Mood and Affect: Mood normal.        Behavior: Behavior normal.        Thought Content: Thought content normal.        Judgment: Judgment normal.     ED ECG REPORT   Date: 02/09/2020  Rate: 57 bpm  Rhythm: sinus bradycardia  QRS Axis: left  Intervals: normal  ST/T Wave abnormalities: normal  Conduction Disutrbances:none  Narrative Interpretation: Sinus bradycardia 57 bpm, left axis deviation.  Largely unchanged from previous EKG on 02/01/2016.  Old EKG Reviewed: unchanged  I have personally reviewed the EKG tracing and agree with the computerized printout as noted.   Assessment and Plan :   PDMP not reviewed this encounter.  1. Fatigue, unspecified type   2. Weakness   3. Diaphoresis   4. Shortness of breath     COVID-19  testing pending.  Counseled that patient may have had a vasovagal reaction with.  Emphasized need for better and more consistent hydration, no signs of ACS or acute changes.  Recommended  follow-up and further work-up with her PCP. Counseled patient on potential for adverse effects with medications prescribed/recommended today, ER and return-to-clinic precautions discussed, patient verbalized understanding.    Jaynee Eagles, [place] 02/09/20 1743

## 2020-02-10 LAB — SARS CORONAVIRUS 2 (TAT 6-24 HRS): SARS Coronavirus 2: NEGATIVE

## 2020-02-12 ENCOUNTER — Ambulatory Visit (INDEPENDENT_AMBULATORY_CARE_PROVIDER_SITE_OTHER): Payer: PPO | Admitting: Nurse Practitioner

## 2020-02-12 ENCOUNTER — Other Ambulatory Visit: Payer: Self-pay

## 2020-02-12 ENCOUNTER — Encounter: Payer: Self-pay | Admitting: Nurse Practitioner

## 2020-02-12 VITALS — BP 155/81 | HR 60 | Ht 66.0 in | Wt 252.0 lb

## 2020-02-12 DIAGNOSIS — H66003 Acute suppurative otitis media without spontaneous rupture of ear drum, bilateral: Secondary | ICD-10-CM

## 2020-02-12 DIAGNOSIS — N3090 Cystitis, unspecified without hematuria: Secondary | ICD-10-CM

## 2020-02-12 DIAGNOSIS — Z0189 Encounter for other specified special examinations: Secondary | ICD-10-CM

## 2020-02-12 HISTORY — DX: Cystitis, unspecified without hematuria: N30.90

## 2020-02-12 HISTORY — DX: Encounter for other specified special examinations: Z01.89

## 2020-02-12 HISTORY — DX: Acute suppurative otitis media without spontaneous rupture of ear drum, bilateral: H66.003

## 2020-02-12 LAB — POCT URINALYSIS DIP (CLINITEK)
Bilirubin, UA: NEGATIVE
Blood, UA: NEGATIVE
Glucose, UA: NEGATIVE mg/dL
Ketones, POC UA: NEGATIVE mg/dL
Nitrite, UA: NEGATIVE
POC PROTEIN,UA: NEGATIVE
Spec Grav, UA: 1.02 (ref 1.010–1.025)
Urobilinogen, UA: 0.2 E.U./dL
pH, UA: 7 (ref 5.0–8.0)

## 2020-02-12 MED ORDER — CEPHALEXIN 500 MG PO CAPS: 500.0000 mg | ORAL_CAPSULE | Freq: Three times a day (TID) | ORAL | 0 refills | Status: DC

## 2020-02-12 NOTE — Progress Notes (Signed)
New Patient Office Visit  Subjective:  Patient ID: Emily [name], female    DOB: September 17, 1950  Age: 69 y.o. MRN: [id number]  CC: No chief complaint on file.   HPI Emily [name] presents for a new patient appointment. Plan to defer typical new patient assessment today due to patient illness.   Patient reports on Monday 02/09/2020 while working she began to feel ill. She works for a [employer] and was outside at a funeral and had been in the sun for about an hour at the time of the episode. She states that she sat in the shade for a while and subsequently went to the Urgent Care for evaluation due to continuation of symptoms.   She endorses at the time of the event her skin felt hot, she had difficulty taking in a deep breath, and felt sick to her stomach.  Since that time she reports her breathing has improved, but she has progressively felt worse and continued to have feelings of being hot, and ear ache, sinus pain and pressure, sore throat, non-productive cough, and a severe headache this morning. She also reports increased urination, and burning while urinating that she first noticed this morning and dizziness upon standing that resolves shortly after standing.   She reports that she took Advil and benadryl this morning which helped with the headache significantly.   She denies loss of taste or smell, nausea, vomiting, diarrhea. She denies back pain, flank pain, or suprapubic pressure.   Past Medical History:  Diagnosis Date  . Allergy    seasonal   . Anxiety   . Cancer ([place]) 2009   squamous cell skin  . Cataract    bilateral surgery, left unsuccessful  . Depression   . Eating disorder   . GERD (gastroesophageal reflux disease)   . Glaucoma   . Hiatal hernia   . History of cardiovascular stress test 04/2000   cardiolyte  . History of flexible sigmoidoscopy 09/21/1999    Past Surgical History:  Procedure Laterality Date  . ABDOMINAL HYSTERECTOMY    .  APPENDECTOMY    . CARDIAC CATHETERIZATION  04/2000  . CARDIAC CATHETERIZATION    . COLONOSCOPY  12/31/09  . UPPER GASTROINTESTINAL ENDOSCOPY  09/15/1999  . WISDOM TOOTH EXTRACTION      Family History  Problem Relation Age of Onset  . Arthritis Other   . Cancer Other        lung  . Coronary artery disease Other   . Stroke Other   . Colon cancer Neg Hx   . Colon polyps Neg Hx   . Esophageal cancer Neg Hx   . Rectal cancer Neg Hx   . Stomach cancer Neg Hx     Social History   Socioeconomic History  . Marital status: Married    Spouse name: Not on file  . Number of children: Not on file  . Years of education: Not on file  . Highest education level: Not on file  Occupational History  . Not on file  Tobacco Use  . Smoking status: Former Smoker    Packs/day: 0.50    Years: 20.00    Pack years: 10.00    Types: Cigarettes    Quit date: 05/27/2008    Years since quitting: 11.7  . Smokeless tobacco: Never Used  Vaping Use  . Vaping Use: Never used  Substance and Sexual Activity  . Alcohol use: Yes    Comment: occ  . Drug use: No  . Sexual  activity: Not on file  Other Topics Concern  . Not on file  Social History Narrative  . Not on file   Social Determinants of Health   Financial Resource Strain:   . Difficulty of Paying Living Expenses: Not on file  Food Insecurity:   . Worried About Charity fundraiser in the Last Year: Not on file  . Ran Out of Food in the Last Year: Not on file  Transportation Needs:   . Lack of Transportation (Medical): Not on file  . Lack of Transportation (Non-Medical): Not on file  Physical Activity:   . Days of Exercise per Week: Not on file  . Minutes of Exercise per Session: Not on file  Stress:   . Feeling of Stress : Not on file  Social Connections:   . Frequency of Communication with Friends and Family: Not on file  . Frequency of Social Gatherings with Friends and Family: Not on file  . Attends Religious Services: Not on file   . Active Member of Clubs or Organizations: Not on file  . Attends Archivist Meetings: Not on file  . Marital Status: Not on file  Intimate Partner Violence:   . Fear of Current or Ex-Partner: Not on file  . Emotionally Abused: Not on file  . Physically Abused: Not on file  . Sexually Abused: Not on file     Objective:   Today's Vitals: BP (!) 155/81   Pulse 60   Ht 5\' 6"  (1.676 m)   Wt 252 lb (114.3 kg)   SpO2 95%   BMI 40.67 kg/m   Physical Exam Vitals and nursing note reviewed.  Constitutional:      Appearance: She is ill-appearing.  HENT:     Head: Normocephalic.     Right Ear: Ear canal and external ear normal. Swelling present. A middle ear effusion is present. Tympanic membrane is injected, erythematous and bulging.     Left Ear: Ear canal and external ear normal. Swelling present. A middle ear effusion is present. Tympanic membrane is injected, erythematous and bulging.     Ears:     Comments: Purulent mid ear effusion present bilaterally.     Nose: Congestion and rhinorrhea present.     Mouth/Throat:     Mouth: Mucous membranes are moist.     Pharynx: Oropharynx is clear. Posterior oropharyngeal erythema present.  Eyes:     General: Lids are normal.     Extraocular Movements: Extraocular movements intact.     Conjunctiva/sclera:     Right eye: Right conjunctiva is injected.     Left eye: Left conjunctiva is injected.     Pupils: Pupils are equal, round, and reactive to light.  Neck:     Vascular: No carotid bruit.  Cardiovascular:     Rate and Rhythm: Normal rate and regular rhythm.     Pulses: Normal pulses.     Heart sounds: Normal heart sounds.  Pulmonary:     Effort: Pulmonary effort is normal.     Breath sounds: Normal breath sounds.  Abdominal:     General: Abdomen is flat. Bowel sounds are normal. There is no distension.     Palpations: Abdomen is soft.     Tenderness: There is no abdominal tenderness. There is no right CVA tenderness,  left CVA tenderness or guarding.  Musculoskeletal:        General: Normal range of motion.     Cervical back: Normal range of motion.  Right lower leg: No edema.     Left lower leg: No edema.  Lymphadenopathy:     Cervical: Cervical adenopathy present.  Skin:    General: Skin is warm and dry.     Capillary Refill: Capillary refill takes less than 2 seconds.  Neurological:     General: No focal deficit present.     Mental Status: She is alert and oriented to person, place, and time.     Motor: Weakness present.  Psychiatric:        Attention and Perception: Attention normal.        Mood and Affect: Mood normal.        Speech: Speech is delayed.        Behavior: Behavior is slowed.        Thought Content: Thought content normal.        Judgment: Judgment normal.     Assessment & Plan:   Problem List Items Addressed This Visit      Nervous and Auditory   Non-recurrent acute suppurative otitis media of both ears without spontaneous rupture of tympanic membranes - Primary    Symptoms and presentation consistent with acute otitis media with the right > left. Purulent effusion noted bilaterally with injected and bulging TM. TM intact bilaterally.  Suspect infection to be contributing to general feeling of unwell and symptoms of dizziness upon standing.  Discussed the use of cephalosporins with patient. She has safely taken these in the past despite reaction to penicillin.  Will treat with 7 days of Keflex 500mg  TID.  Recommended OTC supportive care medications that may be helpful in recovery. Patient to follow-up if symptoms worsen or fail to improve.       Relevant Medications   cephALEXin (KEFLEX) 500 MG capsule     Genitourinary   Cystitis    Dysuria starting this morning with positive leukocytes on UA. Will send urine for culture.  Plan to treat otitis with Keflex, which will also cover cystitis.  Instructed patient to continue to stay well hydrated.  Follow-up if  symptoms worsen or fail to improve.       Relevant Medications   cephALEXin (KEFLEX) 500 MG capsule   Other Relevant Orders   POCT URINALYSIS DIP (CLINITEK) (Completed)   Urine Culture     Other   Encounter for laboratory test    Patient new to practice. Establish care visit deferred to next encounter due to acute illness at the time of visit.  Labs ordered for routine physical examination.  Patient instructed to follow-up for annual physical exam once symptoms of otitis media and cystitis have resolved. Or sooner if needed.       Relevant Orders   CBC with Differential   COMPLETE METABOLIC PANEL WITH GFR   Lipid panel   Hemoglobin A1c      Outpatient Encounter Medications as of 02/12/2020  Medication Sig  . ALPRAZolam (XANAX) 1 MG tablet Take 1 mg by mouth 3 (three) times daily as needed. For anxiety.  . cephALEXin (KEFLEX) 500 MG capsule Take 1 capsule (500 mg total) by mouth 3 (three) times daily.  . colchicine 0.6 MG tablet Take 1 tablet (0.6 mg total) by mouth 2 (two) times daily as needed. For gout flareups.  . D3-50 50000 units capsule Take 50,000 Units by mouth once a week.  . desvenlafaxine (PRISTIQ) 100 MG 24 hr tablet Take 100 mg by mouth every morning.  . dorzolamide (TRUSOPT) 2 % ophthalmic solution Place 1 drop  into both eyes 2 (two) times daily.  . dorzolamide-timolol (COSOPT) 22.3-6.8 MG/ML ophthalmic solution Place 1 drop into the right eye 2 (two) times daily.  . furosemide (LASIX) 20 MG tablet TAKE 1 TABLET BY MOUTH EVERY DAY AS NEEDED  . latanoprost (XALATAN) 0.005 % ophthalmic solution Place 1 drop into both eyes at bedtime.  . traZODone (DESYREL) 100 MG tablet Take 300 mg by mouth at bedtime.    No facility-administered encounter medications on file as of 02/12/2020.    Follow-up: Return if symptoms worsen or fail to improve. Schedule annual physical exam once symptoms of current illness have resolved.   Orma Render, NP

## 2020-02-12 NOTE — Assessment & Plan Note (Signed)
Dysuria starting this morning with positive leukocytes on UA. Will send urine for culture.  Plan to treat otitis with Keflex, which will also cover cystitis.  Instructed patient to continue to stay well hydrated.  Follow-up if symptoms worsen or fail to improve.

## 2020-02-12 NOTE — Assessment & Plan Note (Signed)
Symptoms and presentation consistent with acute otitis media with the right > left. Purulent effusion noted bilaterally with injected and bulging TM. TM intact bilaterally.  Suspect infection to be contributing to general feeling of unwell and symptoms of dizziness upon standing.  Discussed the use of cephalosporins with patient. She has safely taken these in the past despite reaction to penicillin.  Will treat with 7 days of Keflex 500mg  TID.  Recommended OTC supportive care medications that may be helpful in recovery. Patient to follow-up if symptoms worsen or fail to improve.

## 2020-02-12 NOTE — Patient Instructions (Addendum)
I have sent in the prescription Keflex for your infection. You will take this three times a day for 7 days. If you are still experiencing symptoms come Monday, please let me know.   I want you to rest this weekend as much as you can.   You can take a medication called Advil Cold and Sinus for your symptoms. Please ask for the generic version of this medication as it works just as well and is much less expensive.    Otitis Media, Adult  Otitis media occurs when there is inflammation and fluid in the middle ear. Your middle ear is a part of the ear that contains bones for hearing as well as air that helps send sounds to your brain. What are the causes? This condition is caused by a blockage in the eustachian tube. This tube drains fluid from the ear to the back of the nose (nasopharynx). A blockage in this tube can be caused by an object or by swelling (edema) in the tube. Problems that can cause a blockage include:  A cold or other upper respiratory infection.  Allergies.  An irritant, such as tobacco smoke.  Enlarged adenoids. The adenoids are areas of soft tissue located high in the back of the throat, behind the nose and the roof of the mouth.  A mass in the nasopharynx.  Damage to the ear caused by pressure changes (barotrauma). What are the signs or symptoms? Symptoms of this condition include:  Ear pain.  A fever.  Decreased hearing.  A headache.  Tiredness (lethargy).  Fluid leaking from the ear.  Ringing in the ear. How is this diagnosed? This condition is diagnosed with a physical exam. During the exam your health care provider will use an instrument called an otoscope to look into your ear and check for redness, swelling, and fluid. He or she will also ask about your symptoms. Your health care provider may also order tests, such as:  A test to check the movement of the eardrum (pneumatic otoscopy). This test is done by squeezing a small amount of air into the  ear.  A test that changes air pressure in the middle ear to check how well the eardrum moves and whether the eustachian tube is working (tympanogram). How is this treated? This condition usually goes away on its own within 3-5 days. But if the condition is caused by a bacteria infection and does not go away own its own, or keeps coming back, your health care provider may:  Prescribe antibiotic medicines to treat the infection.  Prescribe or recommend medicines to control pain. Follow these instructions at home:  Take over-the-counter and prescription medicines only as told by your health care provider.  If you were prescribed an antibiotic medicine, take it as told by your health care provider. Do not stop taking the antibiotic even if you start to feel better.  Keep all follow-up visits as told by your health care provider. This is important. Contact a health care provider if:  You have bleeding from your nose.  There is a lump on your neck.  You are not getting better in 5 days.  You feel worse instead of better. Get help right away if:  You have severe pain that is not controlled with medicine.  You have swelling, redness, or pain around your ear.  You have stiffness in your neck.  A part of your face is paralyzed.  The bone behind your ear (mastoid) is tender when you touch  it.  You develop a severe headache. Summary  Otitis media is redness, soreness, and swelling of the middle ear.  This condition usually goes away on its own within 3-5 days.  If the problem does not go away in 3-5 days, your health care provider may prescribe or recommend medicines to treat your symptoms.  If you were prescribed an antibiotic medicine, take it as told by your health care provider. This information is not intended to replace advice given to you by your health care provider. Make sure you discuss any questions you have with your health care provider. Document Revised: 05/18/2017  Document Reviewed: 05/26/2016 Elsevier Patient Education  [place]. Urinary Tract Infection, Adult  A urinary tract infection (UTI) is an infection of any part of the urinary tract. The urinary tract includes the kidneys, ureters, bladder, and urethra. These organs make, store, and get rid of urine in the body. Your health care provider may use other names to describe the infection. An upper UTI affects the ureters and kidneys (pyelonephritis). A lower UTI affects the bladder (cystitis) and urethra (urethritis). What are the causes? Most urinary tract infections are caused by bacteria in your genital area, around the entrance to your urinary tract (urethra). These bacteria grow and cause inflammation of your urinary tract. What increases the risk? You are more likely to develop this condition if:  You have a urinary catheter that stays in place (indwelling).  You are not able to control when you urinate or have a bowel movement (you have incontinence).  You are female and you: ? Use a spermicide or diaphragm for birth control. ? Have low estrogen levels. ? Are pregnant.  You have certain genes that increase your risk (genetics).  You are sexually active.  You take antibiotic medicines.  You have a condition that causes your flow of urine to slow down, such as: ? An enlarged prostate, if you are female. ? Blockage in your urethra (stricture). ? A kidney stone. ? A nerve condition that affects your bladder control (neurogenic bladder). ? Not getting enough to drink, or not urinating often.  You have certain medical conditions, such as: ? Diabetes. ? A weak disease-fighting system (immunesystem). ? Sickle cell disease. ? Gout. ? Spinal cord injury. What are the signs or symptoms? Symptoms of this condition include:  Needing to urinate right away (urgently).  Frequent urination or passing small amounts of urine frequently.  Pain or burning with urination.  Blood in  the urine.  Urine that smells bad or unusual.  Trouble urinating.  Cloudy urine.  Vaginal discharge, if you are female.  Pain in the abdomen or the lower back. You may also have:  Vomiting or a decreased appetite.  Confusion.  Irritability or tiredness.  A fever.  Diarrhea. The first symptom in older adults may be confusion. In some cases, they may not have any symptoms until the infection has worsened. How is this diagnosed? This condition is diagnosed based on your medical history and a physical exam. You may also have other tests, including:  Urine tests.  Blood tests.  Tests for sexually transmitted infections (STIs). If you have had more than one UTI, a cystoscopy or imaging studies may be done to determine the cause of the infections. How is this treated? Treatment for this condition includes:  Antibiotic medicine.  Over-the-counter medicines to treat discomfort.  Drinking enough water to stay hydrated. If you have frequent infections or have other conditions such as a kidney  stone, you may need to see a health care provider who specializes in the urinary tract (urologist). In rare cases, urinary tract infections can cause sepsis. Sepsis is a life-threatening condition that occurs when the body responds to an infection. Sepsis is treated in the hospital with IV antibiotics, fluids, and other medicines. Follow these instructions at home:  Medicines  Take over-the-counter and prescription medicines only as told by your health care provider.  If you were prescribed an antibiotic medicine, take it as told by your health care provider. Do not stop using the antibiotic even if you start to feel better. General instructions  Make sure you: ? Empty your bladder often and completely. Do not hold urine for long periods of time. ? Empty your bladder after sex. ? Wipe from front to back after a bowel movement if you are female. Use each tissue one time when you  wipe.  Drink enough fluid to keep your urine pale yellow.  Keep all follow-up visits as told by your health care provider. This is important. Contact a health care provider if:  Your symptoms do not get better after 1-2 days.  Your symptoms go away and then return. Get help right away if you have:  Severe pain in your back or your lower abdomen.  A fever.  Nausea or vomiting. Summary  A urinary tract infection (UTI) is an infection of any part of the urinary tract, which includes the kidneys, ureters, bladder, and urethra.  Most urinary tract infections are caused by bacteria in your genital area, around the entrance to your urinary tract (urethra).  Treatment for this condition often includes antibiotic medicines.  If you were prescribed an antibiotic medicine, take it as told by your health care provider. Do not stop using the antibiotic even if you start to feel better.  Keep all follow-up visits as told by your health care provider. This is important. This information is not intended to replace advice given to you by your health care provider. Make sure you discuss any questions you have with your health care provider. Document Revised: 05/23/2018 Document Reviewed: 12/13/2017 Elsevier Patient Education  2020 [employer].

## 2020-02-12 NOTE — Assessment & Plan Note (Signed)
Patient new to practice. Establish care visit deferred to next encounter due to acute illness at the time of visit.  Labs ordered for routine physical examination.  Patient instructed to follow-up for annual physical exam once symptoms of otitis media and cystitis have resolved. Or sooner if needed.

## 2020-02-13 LAB — CBC WITH DIFFERENTIAL/PLATELET
Absolute Monocytes: 570 cells/uL (ref 200–950)
Basophils Absolute: 29 cells/uL (ref 0–200)
Basophils Relative: 0.5 %
Eosinophils Absolute: 160 cells/uL (ref 15–500)
Eosinophils Relative: 2.8 %
HCT: 44.9 % (ref 35.0–45.0)
Hemoglobin: 14.9 g/dL (ref 11.7–15.5)
Lymphs Abs: 1476 cells/uL (ref 850–3900)
MCH: 31 pg (ref 27.0–33.0)
MCHC: 33.2 g/dL (ref 32.0–36.0)
MCV: 93.3 fL (ref 80.0–100.0)
MPV: 10.7 fL (ref 7.5–12.5)
Monocytes Relative: 10 %
Neutro Abs: 3466 cells/uL (ref 1500–7800)
Neutrophils Relative %: 60.8 %
Platelets: 232 10*3/uL (ref 140–400)
RBC: 4.81 10*6/uL (ref 3.80–5.10)
RDW: 12.9 % (ref 11.0–15.0)
Total Lymphocyte: 25.9 %
WBC: 5.7 10*3/uL (ref 3.8–10.8)

## 2020-02-13 LAB — LIPID PANEL
Cholesterol: 170 mg/dL (ref <200)
HDL: 56 mg/dL (ref >=50)
LDL Cholesterol (Calc): 93 mg/dL (calc)
Non-HDL Cholesterol (Calc): 114 mg/dL (calc) (ref <130)
Total CHOL/HDL Ratio: 3 (calc) (ref <5.0)
Triglycerides: 116 mg/dL (ref <150)

## 2020-02-13 LAB — URINE CULTURE
MICRO NUMBER:: 10877091
SPECIMEN QUALITY:: ADEQUATE

## 2020-02-13 LAB — COMPLETE METABOLIC PANEL WITH GFR
AG Ratio: 1.9 (calc) (ref 1.0–2.5)
ALT: 18 U/L (ref 6–29)
AST: 16 U/L (ref 10–35)
Albumin: 4.1 g/dL (ref 3.6–5.1)
Alkaline phosphatase (APISO): 55 U/L (ref 37–153)
BUN: 16 mg/dL (ref 7–25)
CO2: 32 mmol/L (ref 20–32)
Calcium: 9.2 mg/dL (ref 8.6–10.4)
Chloride: 101 mmol/L (ref 98–110)
Creat: 0.97 mg/dL (ref 0.50–0.99)
GFR, Est African American: 69 mL/min/{1.73_m2} (ref >=60)
GFR, Est Non African American: 60 mL/min/{1.73_m2} (ref >=60)
Globulin: 2.2 g/dL (calc) (ref 1.9–3.7)
Glucose, Bld: 86 mg/dL (ref 65–139)
Potassium: 4.3 mmol/L (ref 3.5–5.3)
Sodium: 139 mmol/L (ref 135–146)
Total Bilirubin: 0.5 mg/dL (ref 0.2–1.2)
Total Protein: 6.3 g/dL (ref 6.1–8.1)

## 2020-02-13 LAB — HEMOGLOBIN A1C
Hgb A1c MFr Bld: 5.3 % of total Hgb (ref <5.7)
Mean Plasma Glucose: 105 (calc)
eAG (mmol/L): 5.8 (calc)

## 2020-02-13 NOTE — Progress Notes (Signed)
Labs look fantastic.   Still waiting on urine culture. Will follow for results.

## 2020-02-18 ENCOUNTER — Telehealth: Payer: Self-pay

## 2020-02-18 DIAGNOSIS — J019 Acute sinusitis, unspecified: Secondary | ICD-10-CM

## 2020-02-18 DIAGNOSIS — H66003 Acute suppurative otitis media without spontaneous rupture of ear drum, bilateral: Secondary | ICD-10-CM

## 2020-02-18 MED ORDER — LEVOFLOXACIN 500 MG PO TABS: 500.0000 mg | ORAL_TABLET | Freq: Every day | ORAL | 0 refills | Status: DC

## 2020-02-18 NOTE — Telephone Encounter (Signed)
We can try a different antibiotic to see if this is helpful. Since she is allergic to penicillin and doxycycline, we can try levofloxacin. I have sent that in for 7 days.   I recommend she use over the counter treatments to help with symptoms, too.  -Warm compress to the face - Mucinex to help reduce mucous production - Flonase to help with runny nose  - Delsym if she has a cough - She can use Tylenol (1000mg ) and/or Ibuprofen (400-800mg ) for pain alternating each every 4 hours.  - Sudafed (behind the prescription counter) can help dry up some of the excess secretions.   Any of these I recommend the generic brand. Make sure that you read the label closely to make sure that there are not added ingredients (like Mucinex D has a decongestant that will cause a double dose if used with Sudafed or Advil Cold and Sinus has sudafed and ibuprofen)

## 2020-02-18 NOTE — Telephone Encounter (Signed)
Patient called, states she finishes up her cephalexin tomorrow but is still having a lot of symptoms.   Has complaints of ear pain/some swelling, red eyes, eye drainage, and some nausea from possibly post nasal drip. She has not been taking anything OTC for symptoms. Wants to know next steps

## 2020-02-19 ENCOUNTER — Encounter: Payer: Self-pay | Admitting: Nurse Practitioner

## 2020-02-20 ENCOUNTER — Encounter: Payer: Self-pay | Admitting: Nurse Practitioner

## 2020-02-24 DIAGNOSIS — C44619 Basal cell carcinoma of skin of left upper limb, including shoulder: Secondary | ICD-10-CM | POA: Diagnosis not present

## 2020-02-26 ENCOUNTER — Ambulatory Visit (INDEPENDENT_AMBULATORY_CARE_PROVIDER_SITE_OTHER): Payer: PPO | Admitting: Nurse Practitioner

## 2020-02-26 ENCOUNTER — Encounter: Payer: Self-pay | Admitting: Nurse Practitioner

## 2020-02-26 ENCOUNTER — Other Ambulatory Visit: Payer: Self-pay

## 2020-02-26 VITALS — BP 133/86 | HR 83 | Ht 66.0 in | Wt 255.0 lb

## 2020-02-26 DIAGNOSIS — Z23 Encounter for immunization: Secondary | ICD-10-CM

## 2020-02-26 DIAGNOSIS — H66003 Acute suppurative otitis media without spontaneous rupture of ear drum, bilateral: Secondary | ICD-10-CM | POA: Diagnosis not present

## 2020-02-26 DIAGNOSIS — H1033 Unspecified acute conjunctivitis, bilateral: Secondary | ICD-10-CM

## 2020-02-26 DIAGNOSIS — R5383 Other fatigue: Secondary | ICD-10-CM

## 2020-02-26 DIAGNOSIS — J069 Acute upper respiratory infection, unspecified: Secondary | ICD-10-CM | POA: Diagnosis not present

## 2020-02-26 DIAGNOSIS — Z20822 Contact with and (suspected) exposure to covid-19: Secondary | ICD-10-CM | POA: Diagnosis not present

## 2020-02-26 HISTORY — DX: Unspecified acute conjunctivitis, bilateral: H10.33

## 2020-02-26 HISTORY — DX: Other fatigue: R53.83

## 2020-02-26 LAB — POCT INFLUENZA A/B
Influenza A, POC: NEGATIVE
Influenza B, POC: NEGATIVE

## 2020-02-26 MED ORDER — AZELASTINE HCL 0.05 % OP SOLN: 2.0000 [drp] | Freq: Two times a day (BID) | OPHTHALMIC | 11 refills | Status: DC

## 2020-02-26 NOTE — Assessment & Plan Note (Signed)
Symptoms and presentation consistent with upper respiratory infection of unknown etiology.  It was originally thought that her symptoms were bacterial in origin given the negative Covid test and the length of time symptoms have been present.  She was not responsive to initial or secondary antibiotics.  At this time I strongly suspect viral origin, COVID-19 is my strongest concern. Covid and flu swab performed today. Flu swab was negative. Plan to continue supportive care while awaiting for COVID-19 results. Patient informed of the need to quarantine and to not return to work at least until negative test results have been received.  If positive test results have been received we will need to quarantine her until her symptoms have resolved. Recommend increase hydration and rest.  Continue utilization of Advil Cold and Sinus may alternate with Tylenol.  Recommend Mucinex and Flonase for other symptom management. Will consider chest x-ray if symptoms persist into next week.

## 2020-02-26 NOTE — Patient Instructions (Addendum)
We have tested you for the flu and for COVID today. We should have the results soon. They are usually taking about 2 days to come back. I will call you this weekend with the results. You may see them before I do on your MyChart.   For now I want you to stay home and rest. Make sure you are quarantining yourself.   Drink plenty of fluids and rest rest rest.   We may need to consider a chest xray if you are still coughing next week just to make sure this has not gone to your lungs.    Adult OTC Symptom Management for COVID-19  Be sure to drink plenty of fluids and allow your body to rest as much as possible.   Congestion: Guaifenesin (Mucinex)- follow directions on packaging with a maximum dose of 2400mg  in a 24 hour period.  Pain/Fever: Acetaminophen 500mg  -1000mg  every 6-8 hours as needed (MAX 3000mg  in a 24 hour period) Ibuprofen may be used if Acetaminophen is not helpful-  Ibuprofen 200mg  - 400mg  every 4-6 hours as needed (MAX 1200mg  in a 24 hour period)  Cough: Dextromethorphan (Delsym)- follow directions on packing with a maximum dose of 120mg  in a 24 hour period.  Nasal Stuffiness: Saline nasal spray and/or Nettie Pot with sterile saline solution. Oxymetazoline (Afrin) may be used for NO MORE than 3 days  Runny Nose: Fluticasone nasal spray (Flonase) OR Mometasone nasal spray (Nasonex) OR Triamcinolone Acetonide nasal spray (Nasacort)- follow directions on the packaging  Sinus Pain/Pressure: Warm washcloth to the face  Sore Throat: Warm salt water gargles, warm tea, honey, Cepacol or Chloraseptic lozenges   Vics Vapo Rub or other mentholated rubs may help relieve symptoms when used per instructions on the packaging.   **Many medications will have more than one ingredient, be sure you are reading the packaging carefully and not taking more than one dose of the same kind of medication at the same time or too close together. It is OK to use formulas that have all of the ingredients  you want, but do not take them in a combined medication and as separate dose too close together. If you have any questions, the pharmacist will be happy to help you decide what is safe.  IF YOU EXPERIENCE SHORTNESS OF BREATH AT REST, DIZZINESS, OR A CHANGE IN MENTAL STATUS YOU NEED TO SEEK EMERGENCY CARE IMMEDIATELY.   You can register for COVID testing at the [facility] by texting the word "COVID" to [phone]  Or  Visit the [facility] web site and search for [place] or use the following link: [email or web address]  Until 11:00 she if she still asked she does not write something is really wrong with her memory she also feels her home.  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 A viral respiratory infection test is done to diagnose certain viral infections of the respiratory system. The respiratory system includes the nose, throat, windpipe, and lungs. In this test, a sample of the fluid from the back of your nose and throat, or the nasopharynx, is collected and sent to a lab for testing. The results will show whether a virus is causing your infection. It will also help your health care provider plan for your treatment. You may be given this test if:  You have symptoms of a respiratory infection, including  fever, cough, or sore throat.  You are at risk for a respiratory infection because of your work or travel.  You have had contact with someone who is sick, or there are many people who are infected in your community.  It is important to find out if you are infected, even if you do not have symptoms. You do not have to prepare for this test. What is being tested? This test checks for the presence of a virus in your respiratory system. It checks a sample for the genetic material that makes up the virus  (viral genetic material). Sometimes the test may also be used to find bacteria. What kind of sample is taken?  A sample of fluid from the back of your nose and throat, also called the nasopharyngeal fluid, is collected using a swab that is attached to a metal wire or plastic tube (nasopharyngeal swab test). What happens during the test? Your health care provider will collect the sample by:  Tilting your head back.  Inserting the swab through one nostril, and along the bottom of your nose, until it reaches the back of your nose (about 2 inches).  Gently rolling the swab to collect nasopharyngeal fluid. If the swab cannot be easily passed through your nose, your health care provider may collect the nasopharyngeal fluid by:  Inserting the swab through your mouth to the back of your throat.  Inserting the swab halfway inside your nose, to the middle front of the nose. The collected sample will be placed in a culture tube, labeled with your name, and sent to the lab for processing. How are the results reported? Your test results will be reported as either positive or negative. Sometimes, the test results may report that a condition is present when it is not present (false-positive result). This can happen if genetic material remains from a dead virus. Sometimes, the test results may report that a condition is not present when it is present (false-negative result). This can happen if the sample was not collected properly or if there is not enough viral genetic material for the test to detect. What do the results mean?  A positive result means that viral genetic material was found. This also means that you likely have respiratory infection from a virus.  A negative result means that no viral genetic material was found. This also means that you likely do not have a respiratory infection from a virus. If you have a positive result, this test may identify the type of virus or bacteria that you have.  The test may indicate whether your infection is from:  Flu (influenza) viruses.  Coronaviruses.  Rhinoviruses.  Adenoviruses.  Respiratory syncytial virus (RSV).  Bacteria such as pertussis (also called whooping cough), chlamydophila, or mycoplasma. Talk with your health care provider about what your results mean. Questions to ask your health care provider Ask your health care provider, or the department that is doing the test:  When will my results be ready?  How will I get my results?  What are my treatment options?  What other tests do I need?  What are my next steps? Summary  A viral respiratory infection test is done to diagnose certain viral infections in the respiratory system.  This test involves collecting a sample of the fluid from the back of your nose and throat and testing it in a lab for the presence of the virus.  The sample is collected using a swab attached to a metal wire or  plastic tube (nasopharyngeal swab test).  A positive result means that it is likely that you have a viral respiratory infection. A negative result means that it is likely that you do not have a viral respiratory infection.  Talk with your health care provider about what your results mean. This information is not intended to replace advice given to you by your health care provider. Make sure you discuss any questions you have with your health care provider. Document Revised: 12/31/2018 Document Reviewed: 12/03/2018 Elsevier Patient Education  [place].

## 2020-02-26 NOTE — Progress Notes (Signed)
Established Patient Office Visit  Subjective:  Patient ID: Emily [name], female    DOB: 1951-01-04  Age: 69 y.o. MRN: [id number]  CC:  Chief Complaint  Patient presents with  . URI    Continued upper respiratory symptoms including facial pain and pressure, cough, ear pain, eye pain, sore throat.    HPI Emily [name] presents for ongoing upper respiratory symptoms that first began on 08/23.  She was initially tested for Covid in the emergency room however this was found to be negative.  I saw her on 08/26 for for respiratory-like symptoms.  She was found to have otitis media bilaterally and was put on antibiotics.  She later began to exhibit urinary symptoms and her upper respiratory symptoms were not improved therefore antibiotic was changed to levofloxacin.  She has completed this dose of antibiotic and is still experiencing significant symptoms of the upper respiratory infection.  Her urinary symptoms have cleared at this time.  She endorses headache, bilateral ear pain, sore throat, dry cough, extreme fatigue, sinus pain and pressure, reddened eyes, muscle aches, and diarrhea.  She denies known fever, chills, shortness of breath, chest pain, palpitations, dizziness.  She has been taking Advil cold and sinus and Advil for her symptoms with minimal relief.  She also has completed 2 rounds of antibiotics.  Past Medical History:  Diagnosis Date  . Allergy    seasonal   . Anxiety   . Cancer ([place]) 2009   squamous cell skin  . Cataract    bilateral surgery, left unsuccessful  . Depression   . Eating disorder   . GERD (gastroesophageal reflux disease)   . Glaucoma   . Hiatal hernia   . History of cardiovascular stress test 04/2000   cardiolyte  . History of flexible sigmoidoscopy 09/21/1999    Past Surgical History:  Procedure Laterality Date  . ABDOMINAL HYSTERECTOMY    . APPENDECTOMY    . CARDIAC CATHETERIZATION  04/2000  . CARDIAC CATHETERIZATION    .  COLONOSCOPY  12/31/09  . UPPER GASTROINTESTINAL ENDOSCOPY  09/15/1999  . WISDOM TOOTH EXTRACTION      Family History  Problem Relation Age of Onset  . Arthritis Other   . Cancer Other        lung  . Coronary artery disease Other   . Stroke Other   . Colon cancer Neg Hx   . Colon polyps Neg Hx   . Esophageal cancer Neg Hx   . Rectal cancer Neg Hx   . Stomach cancer Neg Hx     Social History   Socioeconomic History  . Marital status: Married    Spouse name: Not on file  . Number of children: Not on file  . Years of education: Not on file  . Highest education level: Not on file  Occupational History  . Not on file  Tobacco Use  . Smoking status: Former Smoker    Packs/day: 0.50    Years: 20.00    Pack years: 10.00    Types: Cigarettes    Quit date: 05/27/2008    Years since quitting: 11.7  . Smokeless tobacco: Never Used  Vaping Use  . Vaping Use: Never used  Substance and Sexual Activity  . Alcohol use: Yes    Comment: occ  . Drug use: No  . Sexual activity: Not on file  Other Topics Concern  . Not on file  Social History Narrative  . Not on file   Social Determinants of Health  Financial Resource Strain:   . Difficulty of Paying Living Expenses: Not on file  Food Insecurity:   . Worried About Charity fundraiser in the Last Year: Not on file  . Ran Out of Food in the Last Year: Not on file  Transportation Needs:   . Lack of Transportation (Medical): Not on file  . Lack of Transportation (Non-Medical): Not on file  Physical Activity:   . Days of Exercise per Week: Not on file  . Minutes of Exercise per Session: Not on file  Stress:   . Feeling of Stress : Not on file  Social Connections:   . Frequency of Communication with Friends and Family: Not on file  . Frequency of Social Gatherings with Friends and Family: Not on file  . Attends Religious Services: Not on file  . Active Member of Clubs or Organizations: Not on file  . Attends Theatre manager Meetings: Not on file  . Marital Status: Not on file  Intimate Partner Violence:   . Fear of Current or Ex-Partner: Not on file  . Emotionally Abused: Not on file  . Physically Abused: Not on file  . Sexually Abused: Not on file    Outpatient Medications Prior to Visit  Medication Sig Dispense Refill  . ALPRAZolam (XANAX) 1 MG tablet Take 1 mg by mouth 3 (three) times daily as needed. For anxiety.    . colchicine 0.6 MG tablet Take 1 tablet (0.6 mg total) by mouth 2 (two) times daily as needed. For gout flareups. 60 tablet 3  . D3-50 50000 units capsule Take 50,000 Units by mouth once a week.  12  . desvenlafaxine (PRISTIQ) 100 MG 24 hr tablet Take 100 mg by mouth every morning.  12  . dorzolamide (TRUSOPT) 2 % ophthalmic solution Place 1 drop into both eyes 2 (two) times daily.    . dorzolamide-timolol (COSOPT) 22.3-6.8 MG/ML ophthalmic solution Place 1 drop into the right eye 2 (two) times daily.    . furosemide (LASIX) 20 MG tablet TAKE 1 TABLET BY MOUTH EVERY DAY AS NEEDED 30 tablet 0  . latanoprost (XALATAN) 0.005 % ophthalmic solution Place 1 drop into both eyes at bedtime.    . traZODone (DESYREL) 100 MG tablet Take 300 mg by mouth at bedtime.     . cephALEXin (KEFLEX) 500 MG capsule Take 1 capsule (500 mg total) by mouth 3 (three) times daily. 21 capsule 0  . levofloxacin (LEVAQUIN) 500 MG tablet Take 1 tablet (500 mg total) by mouth daily. 7 tablet 0   No facility-administered medications prior to visit.    Allergies  Allergen Reactions  . Doxycycline     Gi upset  . Penicillins     REACTION: rash, aggression Has patient had a PCN reaction causing immediate rash, facial/tongue/throat swelling, SOB or lightheadedness with hypotension: YES Has patient had a PCN reaction causing severe rash involving mucus membranes or skin necrosis: NO Has patient had a PCN reaction that required hospitalizationNO Has patient had a PCN reaction occurring within the last 10  years:NO If all of the above answers are "NO", then may proceed with Cephalosporin use.   . Prednisone     REACTION: agressive behavior      Objective:    Physical Exam Vitals and nursing note reviewed.  Constitutional:      Appearance: She is ill-appearing.  HENT:     Head: Normocephalic.     Right Ear: Tenderness present. No middle ear effusion. Tympanic membrane  is injected and bulging.     Left Ear: Tenderness present.  No middle ear effusion. Tympanic membrane is injected.     Ears:     Comments: Previous effusion has resolved in the ears however injected tympanic membranes bilaterally remain.    Nose: Congestion and rhinorrhea present.     Mouth/Throat:     Mouth: Mucous membranes are moist.     Pharynx: Posterior oropharyngeal erythema present. No oropharyngeal exudate.  Eyes:     Extraocular Movements: Extraocular movements intact.     Conjunctiva/sclera:     Right eye: Right conjunctiva is injected. No exudate.    Left eye: Left conjunctiva is injected. No exudate.    Pupils: Pupils are equal, round, and reactive to light.  Neck:     Vascular: No carotid bruit.  Cardiovascular:     Rate and Rhythm: Normal rate and regular rhythm.     Pulses: Normal pulses.     Heart sounds: Normal heart sounds.  Pulmonary:     Effort: Pulmonary effort is normal.     Breath sounds: Normal breath sounds. No wheezing or rhonchi.  Abdominal:     General: Abdomen is flat. Bowel sounds are normal. There is no distension.     Palpations: Abdomen is soft.     Tenderness: There is no abdominal tenderness. There is no right CVA tenderness or left CVA tenderness.  Musculoskeletal:        General: Normal range of motion.     Cervical back: Normal range of motion. No rigidity.     Right lower leg: No edema.     Left lower leg: No edema.  Lymphadenopathy:     Cervical: No cervical adenopathy.  Skin:    General: Skin is warm and dry.     Capillary Refill: Capillary refill takes less than  2 seconds.  Neurological:     General: No focal deficit present.     Mental Status: She is alert and oriented to person, place, and time.  Psychiatric:        Mood and Affect: Mood normal.        Behavior: Behavior normal.        Thought Content: Thought content normal.        Judgment: Judgment normal.     BP 133/86   Pulse 83   Ht 5\' 6"  (1.676 m)   Wt 255 lb (115.7 kg)   BMI 41.16 kg/m  Wt Readings from Last 3 Encounters:  02/26/20 255 lb (115.7 kg)  02/12/20 252 lb (114.3 kg)  08/14/19 253 lb (114.8 kg)     Health Maintenance Due  Topic Date Due  . Hepatitis C Screening  Never done  . DEXA SCAN  Never done    There are no preventive care reminders to display for this patient.  Lab Results  Component Value Date   TSH 3.10 01/27/2016   Lab Results  Component Value Date   WBC 5.7 02/12/2020   HGB 14.9 02/12/2020   HCT 44.9 02/12/2020   MCV 93.3 02/12/2020   PLT 232 02/12/2020   Lab Results  Component Value Date   NA 139 02/12/2020   K 4.3 02/12/2020   CO2 32 02/12/2020   GLUCOSE 86 02/12/2020   BUN 16 02/12/2020   CREATININE 0.97 02/12/2020   BILITOT 0.5 02/12/2020   ALKPHOS 54 11/14/2018   AST 16 02/12/2020   ALT 18 02/12/2020   PROT 6.3 02/12/2020   ALBUMIN 4.1 11/14/2018  CALCIUM 9.2 02/12/2020   ANIONGAP 9 01/29/2016   GFR 64.64 11/14/2018   Lab Results  Component Value Date   CHOL 170 02/12/2020   Lab Results  Component Value Date   HDL 56 02/12/2020   Lab Results  Component Value Date   LDLCALC 93 02/12/2020   Lab Results  Component Value Date   TRIG 116 02/12/2020   Lab Results  Component Value Date   CHOLHDL 3.0 02/12/2020   Lab Results  Component Value Date   HGBA1C 5.3 02/12/2020      Assessment & Plan:   Problem List Items Addressed This Visit      Respiratory   Acute upper respiratory infection    Symptoms and presentation consistent with upper respiratory infection of unknown etiology.  It was originally  thought that her symptoms were bacterial in origin given the negative Covid test and the length of time symptoms have been present.  She was not responsive to initial or secondary antibiotics.  At this time I strongly suspect viral origin, COVID-19 is my strongest concern. Covid and flu swab performed today. Flu swab was negative. Plan to continue supportive care while awaiting for COVID-19 results. Patient informed of the need to quarantine and to not return to work at least until negative test results have been received.  If positive test results have been received we will need to quarantine her until her symptoms have resolved. Recommend increase hydration and rest.  Continue utilization of Advil Cold and Sinus may alternate with Tylenol.  Recommend Mucinex and Flonase for other symptom management. Will consider chest x-ray if symptoms persist into next week.        Nervous and Auditory   Non-recurrent acute suppurative otitis media of both ears without spontaneous rupture of tympanic membranes    Otitis media appears to be resolving. Tympanic membranes remain injected however light reflex is intact and no purulent or clear fluid noted behind the tympanic membranes. Recommend continue supportive care and increased rest. Patient tested for flu today-negative. Patient tested for COVID-19 today-awaiting results.         Other   Suspected COVID-19 virus infection - Primary    Strongly suspect presence of COVID-19 given the patient's current symptoms and lack of response with antibiotic therapy. She did have a negative Covid test Emily [name] in the course of her illness but has not been retested since that time. Plan to retest for Covid today-awaiting results. Flu test in the office today was negative. Recommend quarantine and conservative management with Advil, Tylenol, Mucinex, and Delsym. She is out of the window for monoclonal antibody treatment at this time. Will consider chest x-ray if her  cough and symptoms prolonged into next week. We will follow up with patient based on results of COVID-19 test.      Other fatigue    Severe to fatigue in the presence of upper respiratory symptoms. Strongly suspect COVID-19 viral illness. Patient tested today awaiting results. We will make changes to plan of care based on results received. Conservative management recommended.      Relevant Orders   Novel Coronavirus, NAA ([facility])   POCT Influenza A/B (Completed)   Acute conjunctivitis of both eyes    Conjunctivitis in the setting of upper respiratory infection symptoms. Prescription provided for antihistamine drops for the eyes.      Relevant Medications   azelastine (OPTIVAR) 0.05 % ophthalmic solution    Other Visit Diagnoses    Need for pneumococcal vaccination  Relevant Orders   Pneumococcal polysaccharide vaccine 23-valent greater than or equal to 2yo subcutaneous/IM (Completed)   Need for influenza vaccination       Relevant Orders   Flu Vaccine QUAD High Dose(Fluad) (Completed)   Need for Tdap vaccination       Relevant Orders   Tdap vaccine greater than or equal to 7yo IM (Completed)      Meds ordered this encounter  Medications  . azelastine (OPTIVAR) 0.05 % ophthalmic solution    Sig: Place 2 drops into both eyes 2 (two) times daily.    Dispense:  6 mL    Refill:  11    Follow-up: Return if symptoms worsen or fail to improve. Will follow up with patient once test results are received and determine next course of action based on results.   Orma Render, NP

## 2020-02-26 NOTE — Assessment & Plan Note (Signed)
Severe to fatigue in the presence of upper respiratory symptoms. Strongly suspect COVID-19 viral illness. Patient tested today awaiting results. We will make changes to plan of care based on results received. Conservative management recommended.

## 2020-02-26 NOTE — Assessment & Plan Note (Signed)
Strongly suspect presence of COVID-19 given the patient's current symptoms and lack of response with antibiotic therapy. She did have a negative Covid test Emily [name] in the course of her illness but has not been retested since that time. Plan to retest for Covid today-awaiting results. Flu test in the office today was negative. Recommend quarantine and conservative management with Advil, Tylenol, Mucinex, and Delsym. She is out of the window for monoclonal antibody treatment at this time. Will consider chest x-ray if her cough and symptoms prolonged into next week. We will follow up with patient based on results of COVID-19 test.

## 2020-02-26 NOTE — Assessment & Plan Note (Signed)
Conjunctivitis in the setting of upper respiratory infection symptoms. Prescription provided for antihistamine drops for the eyes.

## 2020-02-26 NOTE — Assessment & Plan Note (Signed)
Otitis media appears to be resolving. Tympanic membranes remain injected however light reflex is intact and no purulent or clear fluid noted behind the tympanic membranes. Recommend continue supportive care and increased rest. Patient tested for flu today-negative. Patient tested for COVID-19 today-awaiting results.

## 2020-03-01 ENCOUNTER — Encounter: Payer: Self-pay | Admitting: Nurse Practitioner

## 2020-03-01 LAB — NOVEL CORONAVIRUS, NAA

## 2020-03-01 NOTE — Progress Notes (Signed)
Your COVID test came back as inconclusive. The initial test was negative, but when they ran the additional testing on negative tests it looks like they could not get a conclusive result. This could be a false negative given the symptoms you are having. I think it would be best to have you quarantine the full days days from symptom onset (or until your symptoms are improving and you are fever free without medication for at least 24 hours) just to be sure.  If you would like, we can recollect and retest, but given that you are negative for the flu and you have not responded to antibiotic therapy, I feel there is a very high probability that this was a false negative obtained initially.

## 2020-03-07 ENCOUNTER — Encounter: Payer: Self-pay | Admitting: Nurse Practitioner

## 2020-03-08 ENCOUNTER — Encounter: Payer: Self-pay | Admitting: Nurse Practitioner

## 2020-03-08 ENCOUNTER — Ambulatory Visit (INDEPENDENT_AMBULATORY_CARE_PROVIDER_SITE_OTHER): Payer: PPO | Admitting: Nurse Practitioner

## 2020-03-08 VITALS — BP 145/84 | HR 82 | Temp 97.5°F | Wt 253.0 lb

## 2020-03-08 DIAGNOSIS — J0111 Acute recurrent frontal sinusitis: Secondary | ICD-10-CM

## 2020-03-08 DIAGNOSIS — H5789 Other specified disorders of eye and adnexa: Secondary | ICD-10-CM | POA: Insufficient documentation

## 2020-03-08 DIAGNOSIS — J0110 Acute frontal sinusitis, unspecified: Secondary | ICD-10-CM | POA: Diagnosis not present

## 2020-03-08 DIAGNOSIS — H60391 Other infective otitis externa, right ear: Secondary | ICD-10-CM | POA: Diagnosis not present

## 2020-03-08 DIAGNOSIS — H6693 Otitis media, unspecified, bilateral: Secondary | ICD-10-CM | POA: Insufficient documentation

## 2020-03-08 DIAGNOSIS — H6090 Unspecified otitis externa, unspecified ear: Secondary | ICD-10-CM | POA: Insufficient documentation

## 2020-03-08 DIAGNOSIS — H10413 Chronic giant papillary conjunctivitis, bilateral: Secondary | ICD-10-CM | POA: Diagnosis not present

## 2020-03-08 HISTORY — DX: Unspecified otitis externa, unspecified ear: H60.90

## 2020-03-08 HISTORY — DX: Otitis media, unspecified, bilateral: H66.93

## 2020-03-08 HISTORY — DX: Acute recurrent frontal sinusitis: J01.11

## 2020-03-08 HISTORY — DX: Other specified disorders of eye and adnexa: H57.89

## 2020-03-08 HISTORY — DX: Acute frontal sinusitis, unspecified: J01.10

## 2020-03-08 MED ORDER — TOBRAMYCIN-DEXAMETHASONE 0.3-0.1 % OP SUSP: OPHTHALMIC | 0 refills | Status: DC

## 2020-03-08 MED ORDER — CIPROFLOXACIN HCL 500 MG PO TABS: 500.0000 mg | ORAL_TABLET | Freq: Two times a day (BID) | ORAL | 0 refills | Status: DC

## 2020-03-08 NOTE — Assessment & Plan Note (Signed)
Significant tenderness to the auricle and tragus of the right ear with erythema noted within the auditory canal.  Tympanic membrane is intact however bulging is noted. Otitis media bilaterally appears to have resolved at this time.  However, given the recent bilateral ear infection and COVID-19 I measured her immune system is quite depleted. Treating frontal sinusitis with ciprofloxacin and will add tobramycin/dexamethasone solution for use in the ear 3 times a day for the next 7 days to help with suspected otitis externa. We will follow up in 1 week to determine effectiveness of treatment measures.

## 2020-03-08 NOTE — Progress Notes (Signed)
Established Patient Office Visit  Subjective:  Patient ID: Emily [name], female    DOB: [birth year]  Age: 69 y.o. MRN: [id number]  CC:  Chief Complaint  Patient presents with  . Eye Problem  . Ear Pain    HPI Emily [name] is a pleasant 69 year old female presenting today with ongoing concerns for right sided ear pain and pressure, sinus pain and pressure, and red eyes with photophobia.  She has been feeling unwell for approximately 6 weeks now and was recently diagnosed with COVID-19.  She has completed her quarantine for this and is overall improving however she is still experiencing some residual symptoms.  EYE REDNESS She has been experiencing eye redness waxing and waning for approximately 4 weeks.  She denies any pain, discharge, or itching.  She does feel that her vision is more blurry especially when looking at the computer screen and she is experiencing significant photophobia most notably when out in the sun.  She does have a longstanding history of glaucoma for which she received treatment in the past however recently her ophthalmologist felt that this was an incorrect diagnosis and therefore stopped treatment.    She did go to the eye doctor this morning on my recommendation for evaluation of the photophobia and red eye however she reports he informed her that "what ever was going on with the sinuses was causing the eye irritation".  RIGHT EAR PAIN She also reports ongoing right-sided ear pain that has been present for the past several weeks.  She was treated for otitis media and her symptoms did improve however they have returned.  She denies current vertigo however reports that this was present earlier in the infection, denies ringing and denies hearing loss.  SINUS PAIN AND PRESSURE She has also been experiencing ongoing sinus pain and pressure only to the frontal sinus region.  She reports this initially improved but has returned.   She is also experiencing  symptoms of fatigue and some arthralgia.   She denies fever, nausea, vomiting, lymphadenopathy, dry mouth or dry mucous membranes.  Past Medical History:  Diagnosis Date  . Allergy    seasonal   . Anxiety   . Cancer ([place]) 2009   squamous cell skin  . Cataract    bilateral surgery, left unsuccessful  . Depression   . Eating disorder   . GERD (gastroesophageal reflux disease)   . Glaucoma   . Hiatal hernia   . History of cardiovascular stress test 04/2000   cardiolyte  . History of flexible sigmoidoscopy 09/21/1999    Past Surgical History:  Procedure Laterality Date  . ABDOMINAL HYSTERECTOMY    . APPENDECTOMY    . CARDIAC CATHETERIZATION  04/2000  . CARDIAC CATHETERIZATION    . COLONOSCOPY  12/31/09  . UPPER GASTROINTESTINAL ENDOSCOPY  09/15/1999  . WISDOM TOOTH EXTRACTION      Family History  Problem Relation Age of Onset  . Arthritis Other   . Cancer Other        lung  . Coronary artery disease Other   . Stroke Other   . Colon cancer Neg Hx   . Colon polyps Neg Hx   . Esophageal cancer Neg Hx   . Rectal cancer Neg Hx   . Stomach cancer Neg Hx     Social History   Socioeconomic History  . Marital status: Married    Spouse name: Not on file  . Number of children: Not on file  . Years of education: Not on  file  . Highest education level: Not on file  Occupational History  . Not on file  Tobacco Use  . Smoking status: Former Smoker    Packs/day: 0.50    Years: 20.00    Pack years: 10.00    Types: Cigarettes    Quit date: 05/27/2008    Years since quitting: 11.7  . Smokeless tobacco: Never Used  Vaping Use  . Vaping Use: Never used  Substance and Sexual Activity  . Alcohol use: Yes    Comment: occ  . Drug use: No  . Sexual activity: Not on file  Other Topics Concern  . Not on file  Social History Narrative  . Not on file   Social Determinants of Health   Financial Resource Strain:   . Difficulty of Paying Living Expenses: Not on file  Food  Insecurity:   . Worried About Charity fundraiser in the Last Year: Not on file  . Ran Out of Food in the Last Year: Not on file  Transportation Needs:   . Lack of Transportation (Medical): Not on file  . Lack of Transportation (Non-Medical): Not on file  Physical Activity:   . Days of Exercise per Week: Not on file  . Minutes of Exercise per Session: Not on file  Stress:   . Feeling of Stress : Not on file  Social Connections:   . Frequency of Communication with Friends and Family: Not on file  . Frequency of Social Gatherings with Friends and Family: Not on file  . Attends Religious Services: Not on file  . Active Member of Clubs or Organizations: Not on file  . Attends Archivist Meetings: Not on file  . Marital Status: Not on file  Intimate Partner Violence:   . Fear of Current or Ex-Partner: Not on file  . Emotionally Abused: Not on file  . Physically Abused: Not on file  . Sexually Abused: Not on file    Outpatient Medications Prior to Visit  Medication Sig Dispense Refill  . ALPRAZolam (XANAX) 1 MG tablet Take 1 mg by mouth 3 (three) times daily as needed. For anxiety.    [name] azelastine (OPTIVAR) 0.05 % ophthalmic solution Place 2 drops into both eyes 2 (two) times daily. 6 mL 11  . colchicine 0.6 MG tablet Take 1 tablet (0.6 mg total) by mouth 2 (two) times daily as needed. For gout flareups. 60 tablet 3  . D3-50 50000 units capsule Take 50,000 Units by mouth once a week.  12  . desvenlafaxine (PRISTIQ) 100 MG 24 hr tablet Take 100 mg by mouth every morning.  12  . dorzolamide (TRUSOPT) 2 % ophthalmic solution Place 1 drop into both eyes 2 (two) times daily.    . dorzolamide-timolol (COSOPT) 22.3-6.8 MG/ML ophthalmic solution Place 1 drop into the right eye 2 (two) times daily.    . furosemide (LASIX) 20 MG tablet TAKE 1 TABLET BY MOUTH EVERY DAY AS NEEDED 30 tablet 0  . latanoprost (XALATAN) 0.005 % ophthalmic solution Place 1 drop into both eyes at bedtime.    .  traZODone (DESYREL) 100 MG tablet Take 300 mg by mouth at bedtime.      No facility-administered medications prior to visit.    Allergies  Allergen Reactions  . Doxycycline     Gi upset  . Penicillins     REACTION: rash, aggression Has patient had a PCN reaction causing immediate rash, facial/tongue/throat swelling, SOB or lightheadedness with hypotension: YES Has patient had  a PCN reaction causing severe rash involving mucus membranes or skin necrosis: NO Has patient had a PCN reaction that required hospitalizationNO Has patient had a PCN reaction occurring within the last 10 years:NO If all of the above answers are "NO", then may proceed with Cephalosporin use.   . Prednisone     REACTION: agressive behavior    ROS Review of Systems All review of systems negative except for what is listed in HPI   Objective:    Physical Exam Vitals and nursing note reviewed.  Constitutional:      Appearance: Normal appearance.  HENT:     Head: Normocephalic.     Right Ear: Hearing normal. Swelling and tenderness present. No drainage. There is mastoid tenderness. Tympanic membrane is bulging. Tympanic membrane is not injected, scarred, perforated, erythematous or retracted.     Left Ear: Hearing normal. No drainage, swelling or tenderness. No mastoid tenderness. Tympanic membrane is bulging. Tympanic membrane is not injected, scarred, perforated, erythematous or retracted.     Nose: Mucosal edema and congestion present.     Mouth/Throat:     Mouth: Mucous membranes are moist.     Pharynx: Oropharynx is clear.  Eyes:     General: Lids are normal. Lids are everted, no foreign bodies appreciated. Gaze aligned appropriately.     Extraocular Movements: Extraocular movements intact.     Right eye: Normal extraocular motion and no nystagmus.     Left eye: Normal extraocular motion and no nystagmus.     Conjunctiva/sclera:     Right eye: Right conjunctiva is injected. No exudate.    Left eye:  Left conjunctiva is injected. No exudate.    Pupils: Pupils are equal, round, and reactive to light.  Neck:     Vascular: No carotid bruit.  Cardiovascular:     Rate and Rhythm: Normal rate and regular rhythm.     Pulses: Normal pulses.     Heart sounds: Normal heart sounds.  Pulmonary:     Effort: Pulmonary effort is normal.     Breath sounds: Normal breath sounds.  Abdominal:     General: Abdomen is flat. Bowel sounds are normal.     Palpations: Abdomen is soft.  Musculoskeletal:        General: Normal range of motion.     Cervical back: Normal range of motion.     Right lower leg: No edema.     Left lower leg: No edema.  Lymphadenopathy:     Cervical: Cervical adenopathy present.     Right cervical: Superficial cervical adenopathy and posterior cervical adenopathy present.     Left cervical: Superficial cervical adenopathy and posterior cervical adenopathy present.  Skin:    General: Skin is warm and dry.     Capillary Refill: Capillary refill takes less than 2 seconds.  Neurological:     General: No focal deficit present.     Mental Status: She is alert and oriented to person, place, and time.  Psychiatric:        Mood and Affect: Mood normal.        Behavior: Behavior normal.        Thought Content: Thought content normal.        Judgment: Judgment normal.     BP (!) 145/84   Pulse 82   Temp (!) 97.5 F (36.4 C)   Wt 253 lb (114.8 kg)   SpO2 96%   BMI 40.84 kg/m  Wt Readings from Last 3 Encounters:  03/08/20  253 lb (114.8 kg)  02/26/20 255 lb (115.7 kg)  02/12/20 252 lb (114.3 kg)     Health Maintenance Due  Topic Date Due  . Hepatitis C Screening  Never done  . DEXA SCAN  Never done    There are no preventive care reminders to display for this patient.  Lab Results  Component Value Date   TSH 3.10 01/27/2016   Lab Results  Component Value Date   WBC 5.7 02/12/2020   HGB 14.9 02/12/2020   HCT 44.9 02/12/2020   MCV 93.3 02/12/2020   PLT 232  02/12/2020   Lab Results  Component Value Date   NA 139 02/12/2020   K 4.3 02/12/2020   CO2 32 02/12/2020   GLUCOSE 86 02/12/2020   BUN 16 02/12/2020   CREATININE 0.97 02/12/2020   BILITOT 0.5 02/12/2020   ALKPHOS 54 11/14/2018   AST 16 02/12/2020   ALT 18 02/12/2020   PROT 6.3 02/12/2020   ALBUMIN 4.1 11/14/2018   CALCIUM 9.2 02/12/2020   ANIONGAP 9 01/29/2016   GFR 64.64 11/14/2018   Lab Results  Component Value Date   CHOL 170 02/12/2020   Lab Results  Component Value Date   HDL 56 02/12/2020   Lab Results  Component Value Date   LDLCALC 93 02/12/2020   Lab Results  Component Value Date   TRIG 116 02/12/2020   Lab Results  Component Value Date   CHOLHDL 3.0 02/12/2020   Lab Results  Component Value Date   HGBA1C 5.3 02/12/2020      Assessment & Plan:   Problem List Items Addressed This Visit      Respiratory   Acute non-recurrent frontal sinusitis    Symptoms and presentation consistent with acute nonrecurrent frontal sinusitis in the setting of recent COVID-19 infection. Patient does have allergies to penicillins as well as doxycycline therefore treatment options will be considerably limited for antibiotic choices. We will plan to treat with ciprofloxacin 500 mg twice a day for 5 days and reevaluate for signs of infection.       Relevant Medications   ciprofloxacin (CIPRO) 500 MG tablet     Nervous and Auditory   Otitis externa - Primary    Significant tenderness to the auricle and tragus of the right ear with erythema noted within the auditory canal.  Tympanic membrane is intact however bulging is noted. Otitis media bilaterally appears to have resolved at this time.  However, given the recent bilateral ear infection and COVID-19 I measured her immune system is quite depleted. Treating frontal sinusitis with ciprofloxacin and will add tobramycin/dexamethasone solution for use in the ear 3 times a day for the next 7 days to help with suspected  otitis externa. We will follow up in 1 week to determine effectiveness of treatment measures.      Relevant Medications   tobramycin-dexamethasone (TOBRADEX) ophthalmic solution   ciprofloxacin (CIPRO) 500 MG tablet     Other   Redness of both eyes    Erythema bilaterally to both eyes of unknown etiology. Patient did see ophthalmologist this morning and a complete examination was performed.  No emergent processes were determined by ophthalmology and reportedly patient was told that the redness in the eye were part of the sinus process that she is experiencing.  Ophthalmologist did provide antibiotic eyedrops for her however it is unclear at this time what medication he did prescribed. Discussed with the patient that symptoms could be persistent from the COVID-19 infection which can cause  redness to the eyes and photophobia that persists for longer than 6 weeks. We will plan to follow for now. Recommend wearing sunglasses when outside and avoiding bright lights. Bluelight glasses would be helpful when looking at a computer screen.           Meds ordered this encounter  Medications  . tobramycin-dexamethasone (TOBRADEX) ophthalmic solution    Sig: Place 2 drops in the right ear three times a day for 7 days.    Dispense:  5 mL    Refill:  0  . ciprofloxacin (CIPRO) 500 MG tablet    Sig: Take 1 tablet (500 mg total) by mouth 2 (two) times daily.    Dispense:  10 tablet    Refill:  0    Follow-up: Return if symptoms worsen or fail to improve.    Orma Render, NP

## 2020-03-08 NOTE — Patient Instructions (Addendum)
Sinusitis, Adult Sinusitis is inflammation of your sinuses. Sinuses are hollow spaces in the bones around your face. Your sinuses are located:  Around your eyes.  In the middle of your forehead.  Behind your nose.  In your cheekbones. Mucus normally drains out of your sinuses. When your nasal tissues become inflamed or swollen, mucus can become trapped or blocked. This allows bacteria, viruses, and fungi to grow, which leads to infection. Most infections of the sinuses are caused by a virus. Sinusitis can develop quickly. It can last for up to 4 weeks (acute) or for more than 12 weeks (chronic). Sinusitis often develops after a cold. What are the causes? This condition is caused by anything that creates swelling in the sinuses or stops mucus from draining. This includes:  Allergies.  Asthma.  Infection from bacteria or viruses.  Deformities or blockages in your nose or sinuses.  Abnormal growths in the nose (nasal polyps).  Pollutants, such as chemicals or irritants in the air.  Infection from fungi (rare). What increases the risk? You are more likely to develop this condition if you:  Have a weak body defense system (immune system).  Do a lot of swimming or diving.  Overuse nasal sprays.  Smoke. What are the signs or symptoms? The main symptoms of this condition are pain and a feeling of pressure around the affected sinuses. Other symptoms include:  Stuffy nose or congestion.  Thick drainage from your nose.  Swelling and warmth over the affected sinuses.  Headache.  Upper toothache.  A cough that may get worse at night.  Extra mucus that collects in the throat or the back of the nose (postnasal drip).  Decreased sense of smell and taste.  Fatigue.  A fever.  Sore throat.  Bad breath. How is this diagnosed? This condition is diagnosed based on:  Your symptoms.  Your medical history.  A physical exam.  Tests to find out if your condition is  acute or chronic. This may include: ? Checking your nose for nasal polyps. ? Viewing your sinuses using a device that has a light (endoscope). ? Testing for allergies or bacteria. ? Imaging tests, such as an MRI or CT scan. In rare cases, a bone biopsy may be done to rule out more serious types of fungal sinus disease. How is this treated? Treatment for sinusitis depends on the cause and whether your condition is chronic or acute.  If caused by a virus, your symptoms should go away on their own within 10 days. You may be given medicines to relieve symptoms. They include: ? Medicines that shrink swollen nasal passages (topical intranasal decongestants). ? Medicines that treat allergies (antihistamines). ? A spray that eases inflammation of the nostrils (topical intranasal corticosteroids). ? Rinses that help get rid of thick mucus in your nose (nasal saline washes).  If caused by bacteria, your health care provider may recommend waiting to see if your symptoms improve. Most bacterial infections will get better without antibiotic medicine. You may be given antibiotics if you have: ? A severe infection. ? A weak immune system.  If caused by narrow nasal passages or nasal polyps, you may need to have surgery. Follow these instructions at home: Medicines  Take, use, or apply over-the-counter and prescription medicines only as told by your health care provider. These may include nasal sprays.  If you were prescribed an antibiotic medicine, take it as told by your health care provider. Do not stop taking the antibiotic even if you start   to feel better. Hydrate and humidify   Drink enough fluid to keep your urine pale yellow. Staying hydrated will help to thin your mucus.  Use a cool mist humidifier to keep the humidity level in your home above 50%.  Inhale steam for 10-15 minutes, 3-4 times a day, or as told by your health care provider. You can do this in the bathroom while a hot shower is  running.  Limit your exposure to cool or dry air. Rest  Rest as much as possible.  Sleep with your head raised (elevated).  Make sure you get enough sleep each night. General instructions   Apply a warm, moist washcloth to your face 3-4 times a day or as told by your health care provider. This will help with discomfort.  Wash your hands often with soap and water to reduce your exposure to germs. If soap and water are not available, use hand sanitizer.  Do not smoke. Avoid being around people who are smoking (secondhand smoke).  Keep all follow-up visits as told by your health care provider. This is important. Contact a health care provider if:  You have a fever.  Your symptoms get worse.  Your symptoms do not improve within 10 days. Get help right away if:  You have a severe headache.  You have persistent vomiting.  You have severe pain or swelling around your face or eyes.  You have vision problems.  You develop confusion.  Your neck is stiff.  You have trouble breathing. Summary  Sinusitis is soreness and inflammation of your sinuses. Sinuses are hollow spaces in the bones around your face.  This condition is caused by nasal tissues that become inflamed or swollen. The swelling traps or blocks the flow of mucus. This allows bacteria, viruses, and fungi to grow, which leads to infection.  If you were prescribed an antibiotic medicine, take it as told by your health care provider. Do not stop taking the antibiotic even if you start to feel better.  Keep all follow-up visits as told by your health care provider. This is important. This information is not intended to replace advice given to you by your health care provider. Make sure you discuss any questions you have with your health care provider. Document Revised: 11/05/2017 Document Reviewed: 11/05/2017 Elsevier Patient Education  [place].   Otitis Externa  Otitis externa is an infection of the  outer ear canal. The outer ear canal is the area between the outside of the ear and the eardrum. Otitis externa is sometimes called swimmer's ear. What are the causes? Common causes of this condition include:  Swimming in dirty water.  Moisture in the ear.  An injury to the inside of the ear.  An object stuck in the ear.  A cut or scrape on the outside of the ear. What increases the risk? You are more likely to get this condition if you go swimming often. What are the signs or symptoms?  Itching in the ear. This is often the first symptom.  Swelling of the ear.  Redness in the ear.  Ear pain. The pain may get worse when you pull on your ear.  Pus coming from the ear. How is this treated? This condition may be treated with:  Antibiotic ear drops. These are often given for 10-14 days.  Medicines to reduce itching and swelling. Follow these instructions at home:  If you were given antibiotic ear drops, use them as told by your doctor. Do not  stop using them even if your condition gets better.  Take over-the-counter and prescription medicines only as told by your doctor.  Avoid getting water in your ears as told by your doctor. You may be told to avoid swimming or water sports for a few days.  Keep all follow-up visits as told by your doctor. This is important. How is this prevented?  Keep your ears dry. Use the corner of a towel to dry your ears after you swim or bathe.  Try not to scratch or put things in your ear. Doing these things makes it easier for germs to grow in your ear.  Avoid swimming in lakes, dirty water, or pools that may not have the right amount of a chemical called chlorine. Contact a doctor if:  You have a fever.  Your ear is still red, swollen, or painful after 3 days.  You still have pus coming from your ear after 3 days.  Your redness, swelling, or pain gets worse.  You have a really bad headache.  You have redness, swelling, pain, or  tenderness behind your ear. Summary  Otitis externa is an infection of the outer ear canal.  Symptoms include pain, redness, and swelling of the ear.  If you were given antibiotic ear drops, use them as told by your doctor. Do not stop using them even if your condition gets better.  Try not to scratch or put things in your ear. This information is not intended to replace advice given to you by your health care provider. Make sure you discuss any questions you have with your health care provider. Document Revised: 11/09/2017 Document Reviewed: 11/09/2017 Elsevier Patient Education  [place].

## 2020-03-08 NOTE — Assessment & Plan Note (Signed)
Symptoms and presentation consistent with acute nonrecurrent frontal sinusitis in the setting of recent COVID-19 infection. Patient does have allergies to penicillins as well as doxycycline therefore treatment options will be considerably limited for antibiotic choices. We will plan to treat with ciprofloxacin 500 mg twice a day for 5 days and reevaluate for signs of infection.

## 2020-03-08 NOTE — Assessment & Plan Note (Signed)
Erythema bilaterally to both eyes of unknown etiology. Patient did see ophthalmologist this morning and a complete examination was performed.  No emergent processes were determined by ophthalmology and reportedly patient was told that the redness in the eye were part of the sinus process that she is experiencing.  Ophthalmologist did provide antibiotic eyedrops for her however it is unclear at this time what medication he did prescribed. Discussed with the patient that symptoms could be persistent from the COVID-19 infection which can cause redness to the eyes and photophobia that persists for longer than 6 weeks. We will plan to follow for now. Recommend wearing sunglasses when outside and avoiding bright lights. Bluelight glasses would be helpful when looking at a computer screen.

## 2020-03-30 DIAGNOSIS — H40053 Ocular hypertension, bilateral: Secondary | ICD-10-CM | POA: Diagnosis not present

## 2020-03-30 DIAGNOSIS — H10413 Chronic giant papillary conjunctivitis, bilateral: Secondary | ICD-10-CM | POA: Diagnosis not present

## 2020-04-02 DIAGNOSIS — H10413 Chronic giant papillary conjunctivitis, bilateral: Secondary | ICD-10-CM | POA: Diagnosis not present

## 2020-04-16 DIAGNOSIS — H10413 Chronic giant papillary conjunctivitis, bilateral: Secondary | ICD-10-CM | POA: Diagnosis not present

## 2020-05-19 ENCOUNTER — Ambulatory Visit: Payer: PPO | Admitting: Sports Medicine

## 2020-05-25 DIAGNOSIS — D1801 Hemangioma of skin and subcutaneous tissue: Secondary | ICD-10-CM | POA: Diagnosis not present

## 2020-05-25 DIAGNOSIS — Z85828 Personal history of other malignant neoplasm of skin: Secondary | ICD-10-CM | POA: Diagnosis not present

## 2020-05-25 DIAGNOSIS — L905 Scar conditions and fibrosis of skin: Secondary | ICD-10-CM | POA: Diagnosis not present

## 2020-05-25 DIAGNOSIS — L853 Xerosis cutis: Secondary | ICD-10-CM | POA: Diagnosis not present

## 2020-05-25 DIAGNOSIS — D225 Melanocytic nevi of trunk: Secondary | ICD-10-CM | POA: Diagnosis not present

## 2020-05-27 DIAGNOSIS — H10413 Chronic giant papillary conjunctivitis, bilateral: Secondary | ICD-10-CM | POA: Diagnosis not present

## 2020-07-01 DIAGNOSIS — H10413 Chronic giant papillary conjunctivitis, bilateral: Secondary | ICD-10-CM | POA: Diagnosis not present

## 2020-07-29 DIAGNOSIS — Z85828 Personal history of other malignant neoplasm of skin: Secondary | ICD-10-CM | POA: Diagnosis not present

## 2020-07-29 DIAGNOSIS — L821 Other seborrheic keratosis: Secondary | ICD-10-CM | POA: Diagnosis not present

## 2020-07-29 DIAGNOSIS — I8310 Varicose veins of unspecified lower extremity with inflammation: Secondary | ICD-10-CM | POA: Diagnosis not present

## 2020-07-29 DIAGNOSIS — L905 Scar conditions and fibrosis of skin: Secondary | ICD-10-CM | POA: Diagnosis not present

## 2020-07-29 DIAGNOSIS — D229 Melanocytic nevi, unspecified: Secondary | ICD-10-CM | POA: Diagnosis not present

## 2020-08-18 ENCOUNTER — Telehealth: Payer: Self-pay

## 2020-08-18 MED ORDER — TRAMADOL HCL 50 MG PO TABS
50.0000 mg | ORAL_TABLET | Freq: Three times a day (TID) | ORAL | 0 refills | Status: DC | PRN
Start: 2020-08-18 — End: 2020-09-21

## 2020-08-18 NOTE — Telephone Encounter (Signed)
No problem, adding tramadol for breakthrough pain in the meantime.

## 2020-08-18 NOTE — Telephone Encounter (Signed)
Patient has been having knee pain for over a month now.  She is scheduled to see you on Tues, 08/24/2020 at 945a due to a work conflict. She would like something sent in to help get her through until then if possible. Please and thank you.

## 2020-08-18 NOTE — Telephone Encounter (Signed)
This encounter was created in error - please disregard.

## 2020-08-19 DIAGNOSIS — H04122 Dry eye syndrome of left lacrimal gland: Secondary | ICD-10-CM | POA: Diagnosis not present

## 2020-08-19 DIAGNOSIS — H04121 Dry eye syndrome of right lacrimal gland: Secondary | ICD-10-CM | POA: Diagnosis not present

## 2020-08-19 NOTE — Telephone Encounter (Signed)
Left VM letting patient know a prescription was sent to the pharmacy.

## 2020-08-24 ENCOUNTER — Ambulatory Visit (INDEPENDENT_AMBULATORY_CARE_PROVIDER_SITE_OTHER): Payer: PPO | Admitting: Sports Medicine

## 2020-08-24 ENCOUNTER — Other Ambulatory Visit: Payer: Self-pay

## 2020-08-24 ENCOUNTER — Ambulatory Visit (INDEPENDENT_AMBULATORY_CARE_PROVIDER_SITE_OTHER): Payer: PPO

## 2020-08-24 DIAGNOSIS — M170 Bilateral primary osteoarthritis of knee: Secondary | ICD-10-CM

## 2020-08-24 NOTE — Assessment & Plan Note (Signed)
Emily [name] returns, she is a pleasant 70 year old female, I injected both of her knees back in July 2021, recurrence of pain, left knee only, repeat injection today, return as needed.

## 2020-08-24 NOTE — Progress Notes (Signed)
    Procedures performed today:    Procedure: Real-time Ultrasound Guided injection of the left knee  device: Samsung HS60  Verbal informed consent obtained.  Time-out conducted.  Noted no overlying erythema, induration, or other signs of local infection.  Skin prepped in a sterile fashion.  Local anesthesia: Topical Ethyl chloride.  With sterile technique and under real time ultrasound guidance:  Noted mild arthritic changes, 1 cc Kenalog 40, 2 cc lidocaine, 2 cc bupivacaine injected easily Completed without difficulty  Advised to call if fevers/chills, erythema, induration, drainage, or persistent bleeding.  Images permanently stored and available for review in PACS.  Impression: Technically successful ultrasound guided injection.  Independent interpretation of notes and tests performed by another provider:   None.  Brief History, Exam, Impression, and Recommendations:    Primary osteoarthritis of both knees Khandi returns, she is a pleasant 70 year old female, I injected both of her knees back in July 2021, recurrence of pain, left knee only, repeat injection today, return as needed.    ___________________________________________ Gwen Her. Dianah Field, M.D., ABFM., CAQSM. Primary Care and [place] Instructor of [place] [facility]

## 2020-08-26 ENCOUNTER — Ambulatory Visit (HOSPITAL_BASED_OUTPATIENT_CLINIC_OR_DEPARTMENT_OTHER): Payer: PPO | Admitting: Nurse Practitioner

## 2020-08-26 ENCOUNTER — Encounter (HOSPITAL_BASED_OUTPATIENT_CLINIC_OR_DEPARTMENT_OTHER): Payer: Self-pay | Admitting: Nurse Practitioner

## 2020-08-27 ENCOUNTER — Ambulatory Visit (INDEPENDENT_AMBULATORY_CARE_PROVIDER_SITE_OTHER): Payer: PPO | Admitting: Nurse Practitioner

## 2020-08-27 ENCOUNTER — Encounter (HOSPITAL_BASED_OUTPATIENT_CLINIC_OR_DEPARTMENT_OTHER): Payer: Self-pay | Admitting: Nurse Practitioner

## 2020-08-27 ENCOUNTER — Other Ambulatory Visit: Payer: Self-pay

## 2020-08-27 VITALS — BP 157/71 | HR 72 | Ht 66.0 in | Wt 248.2 lb

## 2020-08-27 DIAGNOSIS — J0110 Acute frontal sinusitis, unspecified: Secondary | ICD-10-CM | POA: Diagnosis not present

## 2020-08-27 MED ORDER — CEFDINIR 300 MG PO CAPS: 300.0000 mg | ORAL_CAPSULE | Freq: Two times a day (BID) | ORAL | 0 refills | Status: DC

## 2020-08-27 NOTE — Progress Notes (Signed)
New Patient Office Visit  Subjective:  Patient ID: Emily [name], female    DOB: March 05, 1951  Age: 69 y.o. MRN: [id number]  CC:  Chief Complaint  Patient presents with  . Sinusitis    HPI Emily [name] is a 70 year old female patient known to me presenting as a new patient to this practice with a concern for sinusitis.   She endorses sinus pain and pressure, headache, low grade fever, scratchy throat, sinus drainage, ear fullness right> left, and cough that have been off and on for a few weeks, but worsening this week starting Tuesday. She has been taking mucinex and her regular medications, but her symptoms are getting worse.   She denies shortness of breath, chest pain, abdominal pain, nausea, vomiting, or diarrhea. She has been vaccinated against COVID.   Past Medical History:  Diagnosis Date  . Acute conjunctivitis of both eyes 02/26/2020  . Acute non-recurrent frontal sinusitis 03/08/2020  . Acute upper respiratory infection 12/01/2009   Qualifier: Diagnosis of  By: Elease Hashimoto MD, Bruce    . Allergy    seasonal   . Ankle sprain 05/18/2011   right  grade 2?  needs help with  fu  has bilateral extremity injuries soft tissue presumed   . Anxiety   . ARTHRALGIA 11/10/2009   Qualifier: Diagnosis of  By: Elease Hashimoto MD, Bruce    . Cancer ([place]) 2009   squamous cell skin  . Cataract    bilateral surgery, left unsuccessful  . Chest wall soft tissue injury 05/18/2011   right  org neg for rib fracture    . Cystitis 02/12/2020  . Depression   . DYSPNEA 11/10/2009   Qualifier: Diagnosis of  By: Elease Hashimoto MD, Bruce    . Eating disorder   . Encounter for laboratory test 02/12/2020  . FOOT PAIN, BILATERAL 09/07/2008   Qualifier: Diagnosis of  By: Elease Hashimoto MD, Bruce    . GERD (gastroesophageal reflux disease)   . Glaucoma   . Head injury 07/23/2018  . Hiatal hernia   . History of cardiovascular stress test 04/2000   cardiolyte  . History of flexible sigmoidoscopy 09/21/1999   . INGROWN NAIL 05/19/2009   Qualifier: Diagnosis of  By: Sarajane Jews MD, Ishmael Holter   . Non-recurrent acute suppurative otitis media of both ears without spontaneous rupture of tympanic membranes 02/12/2020  . Other fatigue 02/26/2020  . Otitis externa 03/08/2020  . Redness of both eyes 03/08/2020  . Sprain of foot, left 05/18/2011  . WEIGHT GAIN 11/10/2009   Qualifier: Diagnosis of  By: Elease Hashimoto MD, Bruce      Past Surgical History:  Procedure Laterality Date  . ABDOMINAL HYSTERECTOMY    . APPENDECTOMY    . CARDIAC CATHETERIZATION  04/2000  . CARDIAC CATHETERIZATION    . COLONOSCOPY  12/31/09  . UPPER GASTROINTESTINAL ENDOSCOPY  09/15/1999  . WISDOM TOOTH EXTRACTION      Family History  Problem Relation Age of Onset  . Arthritis Other   . Cancer Other        lung  . Coronary artery disease Other   . Stroke Other   . Colon cancer Neg Hx   . Colon polyps Neg Hx   . Esophageal cancer Neg Hx   . Rectal cancer Neg Hx   . Stomach cancer Neg Hx     Social History   Socioeconomic History  . Marital status: Married    Spouse name: Not on file  . Number of children: Not  on file  . Years of education: Not on file  . Highest education level: Not on file  Occupational History  . Not on file  Tobacco Use  . Smoking status: Former Smoker    Packs/day: 0.50    Years: 20.00    Pack years: 10.00    Types: Cigarettes    Quit date: 05/27/2008    Years since quitting: 12.2  . Smokeless tobacco: Never Used  Vaping Use  . Vaping Use: Never used  Substance and Sexual Activity  . Alcohol use: Yes    Comment: occ  . Drug use: No  . Sexual activity: Not on file  Other Topics Concern  . Not on file  Social History Narrative  . Not on file   Social Determinants of Health   Financial Resource Strain: Not on file  Food Insecurity: Not on file  Transportation Needs: Not on file  Physical Activity: Not on file  Stress: Not on file  Social Connections: Not on file  Intimate Partner  Violence: Not on file    ROS Review of Systems  All review of systems negative except what is listed in the HPI   Objective:   Today's Vitals: BP (!) 157/71   Pulse 72   Ht 5\' 6"  (1.676 m)   Wt 248 lb 3.2 oz (112.6 kg)   SpO2 96%   BMI 40.06 kg/m   Physical Exam Vitals and nursing note reviewed.  Constitutional:      Appearance: Normal appearance.  HENT:     Head: Normocephalic.     Right Ear: Ear canal and external ear normal. Tenderness present. A middle ear effusion is present. Tympanic membrane is bulging.     Left Ear: Ear canal and external ear normal. A middle ear effusion is present. Tympanic membrane is bulging.     Nose: Congestion and rhinorrhea present.     Right Turbinates: Enlarged.     Left Turbinates: Enlarged.     Right Sinus: Frontal sinus tenderness present.     Left Sinus: Frontal sinus tenderness present.     Mouth/Throat:     Mouth: Mucous membranes are moist.     Pharynx: Oropharynx is clear. Posterior oropharyngeal erythema present.     Tonsils: No tonsillar exudate.  Eyes:     Extraocular Movements: Extraocular movements intact.     Conjunctiva/sclera: Conjunctivae normal.     Right eye: Exudate present.     Left eye: Exudate present.     Pupils: Pupils are equal, round, and reactive to light.  Cardiovascular:     Rate and Rhythm: Normal rate and regular rhythm.     Pulses: Normal pulses.     Heart sounds: Normal heart sounds.  Pulmonary:     Effort: Pulmonary effort is normal.     Breath sounds: Normal breath sounds. No wheezing.  Abdominal:     General: Abdomen is flat. Bowel sounds are normal. There is no distension.     Palpations: Abdomen is soft.     Tenderness: There is no abdominal tenderness.  Musculoskeletal:        General: Normal range of motion.     Cervical back: Normal range of motion.     Right lower leg: No edema.     Left lower leg: No edema.  Lymphadenopathy:     Cervical: Cervical adenopathy present.  Skin:     General: Skin is warm and dry.     Capillary Refill: Capillary refill takes less than 2  seconds.  Neurological:     General: No focal deficit present.     Mental Status: She is alert and oriented to person, place, and time.  Psychiatric:        Mood and Affect: Mood normal.        Behavior: Behavior normal.        Thought Content: Thought content normal.        Judgment: Judgment normal.     Assessment & Plan:  1. Acute non-recurrent frontal sinusitis Symptoms and presentation consistent with frontal sinusitis. Bacterial component most likely due to prolonged symptoms and waxing and waning etiology. Will begin treatment today with Cefdinir for 5 days and continue Mucinex. Addition of fluticasone may also be helpful for symptom management.  Plan to follow-up if symptoms worsen or fail to improve.    Outpatient Encounter Medications as of 08/27/2020  Medication Sig  . ALPRAZolam (XANAX) 1 MG tablet Take 1 mg by mouth 3 (three) times daily as needed. For anxiety.  [name] azelastine (OPTIVAR) 0.05 % ophthalmic solution Place 2 drops into both eyes 2 (two) times daily.  . cefdinir (OMNICEF) 300 MG capsule Take 1 capsule (300 mg total) by mouth 2 (two) times daily.  . colchicine 0.6 MG tablet Take 1 tablet (0.6 mg total) by mouth 2 (two) times daily as needed. For gout flareups.  . D3-50 50000 units capsule Take 50,000 Units by mouth once a week.  . desvenlafaxine (PRISTIQ) 100 MG 24 hr tablet Take 100 mg by mouth every morning.  . dorzolamide (TRUSOPT) 2 % ophthalmic solution Place 1 drop into both eyes 2 (two) times daily.  . furosemide (LASIX) 20 MG tablet TAKE 1 TABLET BY MOUTH EVERY DAY AS NEEDED  . Netarsudil-Latanoprost (ROCKLATAN) 0.02-0.005 % SOLN Apply 1 drop to eye. 1 drop daily both eyes  . traMADol (ULTRAM) 50 MG tablet Take 1-2 tablets (50-100 mg total) by mouth every 8 (eight) hours as needed for moderate pain. Maximum 6 tabs per day.  . traZODone (DESYREL) 100 MG tablet Take 300 mg  by mouth at bedtime.  . [DISCONTINUED] ciprofloxacin (CIPRO) 500 MG tablet Take 1 tablet (500 mg total) by mouth 2 (two) times daily. (Patient not taking: Reported on 08/27/2020)  . [DISCONTINUED] dorzolamide-timolol (COSOPT) 22.3-6.8 MG/ML ophthalmic solution Place 1 drop into the right eye 2 (two) times daily. (Patient not taking: Reported on 08/27/2020)  . [DISCONTINUED] latanoprost (XALATAN) 0.005 % ophthalmic solution Place 1 drop into both eyes at bedtime. (Patient not taking: Reported on 08/27/2020)  . [DISCONTINUED] tobramycin-dexamethasone (TOBRADEX) ophthalmic solution Place 2 drops in the right ear three times a day for 7 days. (Patient not taking: Reported on 08/27/2020)   No facility-administered encounter medications on file as of 08/27/2020.    Follow-up: Return if symptoms worsen or fail to improve.   Orma Render, NP

## 2020-08-27 NOTE — Patient Instructions (Addendum)
It was SO GOOD to see you today!   I have sent in a medication called Cefdinir for you. You take this medication twice a day with food for 5 days. If your symptoms do not improve or get any worse, please let me know.     The following information is provided as a Human resources officer for ADULT patients only and does NOT take into account PREGNANCY, ALLERGIES, LIVER CONDITIONS, KIDNEY CONDITIONS, GASTROINTESTINAL CONDITIONS, OR PRESCRIPTION MEDICATION INTERACTIONS. Please be sure to ask your provider if the following are safe to take with your specific medical history, conditions, or current medication regimen if you are unsure.   Adult Basic Symptom Management for Sinusitis  Congestion: Guaifenesin (Mucinex)- follow directions on packaging with a maximum dose of 2400mg  in a 24 hour period.  Pain/Fever: Ibuprofen 200mg  - 400mg  every 4-6 hours as needed (MAX 1200mg  in a 24 hour period) Pain/Fever: Tylenol 500mg  -1000mg  every 6-8 hours as needed (MAX 3000mg  in a 24 hour period)  Cough: Dextromethorphan (Delsym)- follow directions on packing with a maximum dose of 120mg  in a 24 hour period.  Nasal Stuffiness: Saline nasal spray and/or Nettie Pot with sterile saline solution  Runny Nose: Fluticasone nasal spray (Flonase) OR Mometasone nasal spray (Nasonex) OR Triamcinolone Acetonide nasal spray (Nasacort)- follow directions on the packaging  Pain/Pressure: Warm washcloth to the face  Sore Throat: Warm salt water gargles  If you have allergies, you may also consider taking an oral antihistamine (like Zyrtec or Claritin) as these may also help with your symptoms.  **Many medications will have more than one ingredient, be sure you are reading the packaging carefully and not taking more than one dose of the same kind of medication at the same time or too close together. It is OK to use formulas that have all of the ingredients you want, but do not take them in a combined medication and as separate  dose too close together. If you have any questions, the pharmacist will be happy to help you decide what is safe.

## 2020-09-09 ENCOUNTER — Telehealth (HOSPITAL_BASED_OUTPATIENT_CLINIC_OR_DEPARTMENT_OTHER): Payer: Self-pay | Admitting: Nurse Practitioner

## 2020-09-09 ENCOUNTER — Other Ambulatory Visit (HOSPITAL_BASED_OUTPATIENT_CLINIC_OR_DEPARTMENT_OTHER): Payer: Self-pay | Admitting: Nurse Practitioner

## 2020-09-09 DIAGNOSIS — J0110 Acute frontal sinusitis, unspecified: Secondary | ICD-10-CM

## 2020-09-09 MED ORDER — AZITHROMYCIN 250 MG PO TABS: ORAL_TABLET | ORAL | 0 refills | Status: DC

## 2020-09-09 NOTE — Telephone Encounter (Signed)
Pt called in and stated that after her visit with DNP Emeterio Reeve on 3/11 if she was still not feeling better to give the office a call. Please advise.

## 2020-09-21 ENCOUNTER — Other Ambulatory Visit: Payer: Self-pay

## 2020-09-21 MED ORDER — TRAMADOL HCL 50 MG PO TABS: 50.0000 mg | ORAL_TABLET | Freq: Three times a day (TID) | ORAL | 0 refills | Status: DC | PRN

## 2020-09-21 NOTE — Telephone Encounter (Signed)
Left message that prescription was sent to pharmacy.

## 2020-10-05 DIAGNOSIS — H401132 Primary open-angle glaucoma, bilateral, moderate stage: Secondary | ICD-10-CM | POA: Diagnosis not present

## 2020-10-05 DIAGNOSIS — H04122 Dry eye syndrome of left lacrimal gland: Secondary | ICD-10-CM | POA: Diagnosis not present

## 2020-10-07 ENCOUNTER — Telehealth (HOSPITAL_BASED_OUTPATIENT_CLINIC_OR_DEPARTMENT_OTHER): Payer: Self-pay | Admitting: Nurse Practitioner

## 2020-10-07 NOTE — Telephone Encounter (Signed)
LVMTCB regarding on scheduling patients AWV. This can be done either virtual or in person. Please advise.

## 2020-10-11 NOTE — Telephone Encounter (Signed)
LVMTCB again to cb to schedule AWV

## 2020-10-18 DIAGNOSIS — H401112 Primary open-angle glaucoma, right eye, moderate stage: Secondary | ICD-10-CM | POA: Diagnosis not present

## 2020-11-23 ENCOUNTER — Other Ambulatory Visit (HOSPITAL_BASED_OUTPATIENT_CLINIC_OR_DEPARTMENT_OTHER): Payer: Self-pay

## 2020-11-23 ENCOUNTER — Other Ambulatory Visit: Payer: Self-pay

## 2020-11-23 ENCOUNTER — Encounter (HOSPITAL_BASED_OUTPATIENT_CLINIC_OR_DEPARTMENT_OTHER): Payer: Self-pay | Admitting: Nurse Practitioner

## 2020-11-23 ENCOUNTER — Ambulatory Visit (INDEPENDENT_AMBULATORY_CARE_PROVIDER_SITE_OTHER): Payer: PPO | Admitting: Nurse Practitioner

## 2020-11-23 VITALS — BP 163/88 | HR 59 | Ht 66.0 in | Wt 250.4 lb

## 2020-11-23 DIAGNOSIS — R5383 Other fatigue: Secondary | ICD-10-CM | POA: Diagnosis not present

## 2020-11-23 DIAGNOSIS — R059 Cough, unspecified: Secondary | ICD-10-CM | POA: Diagnosis not present

## 2020-11-23 DIAGNOSIS — R052 Subacute cough: Secondary | ICD-10-CM

## 2020-11-23 DIAGNOSIS — R600 Localized edema: Secondary | ICD-10-CM | POA: Diagnosis not present

## 2020-11-23 DIAGNOSIS — I10 Essential (primary) hypertension: Secondary | ICD-10-CM | POA: Insufficient documentation

## 2020-11-23 HISTORY — DX: Subacute cough: R05.2

## 2020-11-23 HISTORY — DX: Cough, unspecified: R05.9

## 2020-11-23 MED ORDER — BLOOD PRESSURE MONITOR MISC
0 refills | Status: AC
  Filled 2020-11-23: qty 1, 1d supply, fill #0

## 2020-11-23 MED ORDER — CHLORTHALIDONE 25 MG PO TABS
12.5000 mg | ORAL_TABLET | Freq: Every day | ORAL | 0 refills | Status: DC
  Filled 2020-11-23: qty 30, 60d supply, fill #0

## 2020-11-23 NOTE — Assessment & Plan Note (Signed)
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

## 2020-11-23 NOTE — Progress Notes (Signed)
Acute Office Visit  Subjective:    Patient ID: Emily [name], female    DOB: 17-Aug-1950, 70 y.o.   MRN: [id number]  Chief Complaint  Patient presents with  . Leg Pain  . Cough    Patient states she's generally not feeling well.  Her legs bother her when she walks.  Fatigued all the time and doesn't feel like she's getting enough air into her lungs.  Has taken 2 covid tests and both were negative.    HPI Patient is in today for general feeling of unwell.  Endorses cough and feeling as though she cannot get air into her lungs, which has been ongoing since illness in fall of 2021 from suspected COVID-19 She also endorses pain in her lower extremities when walking She is fatigued She denies fever, chills, CP, N/V/D  Past Medical History:  Diagnosis Date  . Acute conjunctivitis of both eyes 02/26/2020  . Acute non-recurrent frontal sinusitis 03/08/2020  . Acute upper respiratory infection 12/01/2009   Qualifier: Diagnosis of  By: Elease Hashimoto MD, Bruce    . Allergy    seasonal   . Ankle sprain 05/18/2011   right  grade 2?  needs help with  fu  has bilateral extremity injuries soft tissue presumed   . Anxiety   . ARTHRALGIA 11/10/2009   Qualifier: Diagnosis of  By: Elease Hashimoto MD, Bruce    . Cancer ([place]) 2009   squamous cell skin  . Cataract    bilateral surgery, left unsuccessful  . Chest wall soft tissue injury 05/18/2011   right  org neg for rib fracture    . Cystitis 02/12/2020  . Depression   . DYSPNEA 11/10/2009   Qualifier: Diagnosis of  By: Elease Hashimoto MD, Bruce    . Eating disorder   . Encounter for laboratory test 02/12/2020  . FOOT PAIN, BILATERAL 09/07/2008   Qualifier: Diagnosis of  By: Elease Hashimoto MD, Bruce    . GERD (gastroesophageal reflux disease)   . Glaucoma   . Head injury 07/23/2018  . Hiatal hernia   . History of cardiovascular stress test 04/2000   cardiolyte  . History of flexible sigmoidoscopy 09/21/1999  . INGROWN NAIL 05/19/2009   Qualifier: Diagnosis of   By: Sarajane Jews MD, Ishmael Holter   . Non-recurrent acute suppurative otitis media of both ears without spontaneous rupture of tympanic membranes 02/12/2020  . Other fatigue 02/26/2020  . Otitis externa 03/08/2020  . Redness of both eyes 03/08/2020  . Sprain of foot, left 05/18/2011  . WEIGHT GAIN 11/10/2009   Qualifier: Diagnosis of  By: Elease Hashimoto MD, Bruce      Past Surgical History:  Procedure Laterality Date  . ABDOMINAL HYSTERECTOMY    . APPENDECTOMY    . CARDIAC CATHETERIZATION  04/2000  . CARDIAC CATHETERIZATION    . COLONOSCOPY  12/31/09  . UPPER GASTROINTESTINAL ENDOSCOPY  09/15/1999  . WISDOM TOOTH EXTRACTION      Family History  Problem Relation Age of Onset  . Arthritis Other   . Cancer Other        lung  . Coronary artery disease Other   . Stroke Other   . Colon cancer Neg Hx   . Colon polyps Neg Hx   . Esophageal cancer Neg Hx   . Rectal cancer Neg Hx   . Stomach cancer Neg Hx     Social History   Socioeconomic History  . Marital status: Married    Spouse name: Not on file  . Number of children:  Not on file  . Years of education: Not on file  . Highest education level: Not on file  Occupational History  . Not on file  Tobacco Use  . Smoking status: Former Smoker    Packs/day: 0.50    Years: 20.00    Pack years: 10.00    Types: Cigarettes    Quit date: 05/27/2008    Years since quitting: 12.5  . Smokeless tobacco: Never Used  Vaping Use  . Vaping Use: Never used  Substance and Sexual Activity  . Alcohol use: Yes    Comment: occ  . Drug use: No  . Sexual activity: Not on file  Other Topics Concern  . Not on file  Social History Narrative  . Not on file   Social Determinants of Health   Financial Resource Strain: Not on file  Food Insecurity: Not on file  Transportation Needs: Not on file  Physical Activity: Not on file  Stress: Not on file  Social Connections: Not on file  Intimate Partner Violence: Not on file    Outpatient Medications Prior to  Visit  Medication Sig Dispense Refill  . ALPRAZolam (XANAX) 1 MG tablet Take 1 mg by mouth 3 (three) times daily as needed. For anxiety.    [name] azelastine (OPTIVAR) 0.05 % ophthalmic solution Place 2 drops into both eyes 2 (two) times daily. 6 mL 11  . D3-50 50000 units capsule Take 50,000 Units by mouth once a week.  12  . desvenlafaxine (PRISTIQ) 100 MG 24 hr tablet Take 100 mg by mouth every morning.  12  . furosemide (LASIX) 20 MG tablet TAKE 1 TABLET BY MOUTH EVERY DAY AS NEEDED 30 tablet 0  . Netarsudil-Latanoprost (ROCKLATAN) 0.02-0.005 % SOLN Apply 1 drop to eye. 1 drop daily both eyes    . traZODone (DESYREL) 100 MG tablet Take 300 mg by mouth at bedtime.    . colchicine 0.6 MG tablet Take 1 tablet (0.6 mg total) by mouth 2 (two) times daily as needed. For gout flareups. 60 tablet 3  . traMADol (ULTRAM) 50 MG tablet Take 1-2 tablets (50-100 mg total) by mouth every 8 (eight) hours as needed for moderate pain. Maximum 6 tabs per day. 30 tablet 0  . buPROPion (WELLBUTRIN XL) 150 MG 24 hr tablet Take 1 tablet by mouth every morning.    . triamcinolone cream (KENALOG) 0.1 % APPLY AS DIRECTED TO AFFECTED AREA TWICE A DAY FOR TWO WEEKS THEN AS NEEDED FOR FLARES    . azithromycin (ZITHROMAX) 250 MG tablet Take 2 tabs (500 mg) together on the first day, then 1 tab (250 mg) daily until prescription complete. 10 tablet 0  . cefdinir (OMNICEF) 300 MG capsule Take 1 capsule (300 mg total) by mouth 2 (two) times daily. 10 capsule 0  . dorzolamide (TRUSOPT) 2 % ophthalmic solution Place 1 drop into both eyes 2 (two) times daily.     No facility-administered medications prior to visit.    Allergies  Allergen Reactions  . Doxycycline     Gi upset  . Penicillins     REACTION: rash, aggression Has patient had a PCN reaction causing immediate rash, facial/tongue/throat swelling, SOB or lightheadedness with hypotension: YES Has patient had a PCN reaction causing severe rash involving mucus membranes or  skin necrosis: NO Has patient had a PCN reaction that required hospitalizationNO Has patient had a PCN reaction occurring within the last 10 years:NO If all of the above answers are "NO", then may proceed with  Cephalosporin use.   . Prednisone     REACTION: agressive behavior    Review of Systems All review of systems negative except what is listed in the HPI     Objective:    Physical Exam Vitals and nursing note reviewed.  Constitutional:      Appearance: Normal appearance. She is ill-appearing.  HENT:     Head: Normocephalic.     Mouth/Throat:     Mouth: Mucous membranes are moist.     Pharynx: Oropharynx is clear.  Eyes:     Extraocular Movements: Extraocular movements intact.     Conjunctiva/sclera: Conjunctivae normal.     Pupils: Pupils are equal, round, and reactive to light.  Cardiovascular:     Rate and Rhythm: Normal rate and regular rhythm.     Pulses: Normal pulses.     Heart sounds: Normal heart sounds.  Pulmonary:     Effort: Pulmonary effort is normal. No respiratory distress.     Breath sounds: No stridor. Wheezing present. No rales.  Chest:     Chest wall: No tenderness.  Abdominal:     Palpations: Abdomen is soft.  Musculoskeletal:        General: Normal range of motion.     Cervical back: Normal range of motion.     Right lower leg: Edema present.     Left lower leg: Edema present.  Lymphadenopathy:     Cervical: No cervical adenopathy.  Skin:    General: Skin is warm and dry.     Capillary Refill: Capillary refill takes less than 2 seconds.  Neurological:     General: No focal deficit present.     Mental Status: She is alert and oriented to person, place, and time.     Cranial Nerves: No cranial nerve deficit.     Sensory: No sensory deficit.     Motor: Weakness present.     Coordination: Coordination normal.     Gait: Gait normal.  Psychiatric:        Mood and Affect: Mood normal.        Behavior: Behavior normal.        Thought  Content: Thought content normal.        Judgment: Judgment normal.     BP (!) 163/88   Pulse (!) 59   Ht 5\' 6"  (1.676 m)   Wt 250 lb 6.4 oz (113.6 kg)   SpO2 99%   BMI 40.42 kg/m  Wt Readings from Last 3 Encounters:  11/23/20 250 lb 6.4 oz (113.6 kg)  08/27/20 248 lb 3.2 oz (112.6 kg)  03/08/20 253 lb (114.8 kg)    Health Maintenance Due  Topic Date Due  . Pneumococcal Vaccine 21-28 Years old (1 of 4 - PCV13) Never done  . Hepatitis C Screening  Never done  . Zoster Vaccines- Shingrix (1 of 2) Never done  . DEXA SCAN  Never done    There are no preventive care reminders to display for this patient.   Lab Results  Component Value Date   TSH 3.10 01/27/2016   Lab Results  Component Value Date   WBC 5.7 02/12/2020   HGB 14.9 02/12/2020   HCT 44.9 02/12/2020   MCV 93.3 02/12/2020   PLT 232 02/12/2020   Lab Results  Component Value Date   NA 139 02/12/2020   K 4.3 02/12/2020   CO2 32 02/12/2020   GLUCOSE 86 02/12/2020   BUN 16 02/12/2020   CREATININE 0.97 02/12/2020  BILITOT 0.5 02/12/2020   ALKPHOS 54 11/14/2018   AST 16 02/12/2020   ALT 18 02/12/2020   PROT 6.3 02/12/2020   ALBUMIN 4.1 11/14/2018   CALCIUM 9.2 02/12/2020   ANIONGAP 9 01/29/2016   GFR 64.64 11/14/2018   Lab Results  Component Value Date   CHOL 170 02/12/2020   Lab Results  Component Value Date   HDL 56 02/12/2020   Lab Results  Component Value Date   LDLCALC 93 02/12/2020   Lab Results  Component Value Date   TRIG 116 02/12/2020   Lab Results  Component Value Date   CHOLHDL 3.0 02/12/2020   Lab Results  Component Value Date   HGBA1C 5.3 02/12/2020       Assessment & Plan:   Problem List Items Addressed This Visit    Fatigue    Fatigue- ongoing since 02/2020- feels worse Will obtain labs today Given cough and LE edema with HTN, want to rule out cardiac involvement No alarm signs present today Labs ordered and BP medication started F/U in 1 week      Relevant  Orders   B Nat Peptide   CBC With Differential   CRP High sensitivity   Hypertension - Primary    BP increasing slowly over last few months.  With given symptoms, recommend starting chlorthalidone to help with diuresis and BP control.  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 chlorthalidone (HYGROTON) 25 MG tablet    Sig: Take 1/2 tablet (12.5 mg total) by mouth daily.    Dispense:  30 tablet    Refill:  0  . Blood Pressure Monitor MISC    Sig: Use as directed    Dispense:  1 each    Refill:  0   F/U in 1-2 weeks-  Due for AWV- will call to schedule  Orma Render, NP

## 2020-11-23 NOTE — Patient Instructions (Addendum)
I am going to start you on a medication for your blood pressure called chlorthalidone 12.5mg  (1/2 tab) daily  I would like you to measure your BP at home daily and let me know what it is in 1-2 weeks.  I would also like you to let me know how the inhaler worked for you in 1-2 weeks.

## 2020-11-23 NOTE — Assessment & Plan Note (Signed)
BP increasing slowly over last few months.  With given symptoms, recommend starting chlorthalidone to help with diuresis and BP control.  Monitor BP at home daily and report readings in 1 week for f/u Information provided

## 2020-11-23 NOTE — Assessment & Plan Note (Signed)
Fatigue- ongoing since 02/2020- feels worse Will obtain labs today Given cough and LE edema with HTN, want to rule out cardiac involvement No alarm signs present today Labs ordered and BP medication started F/U in 1 week

## 2020-11-23 NOTE — Assessment & Plan Note (Signed)
LE edema concerning with sx of fatigue, cough, and HTN Labs today Start chlorthalidone for BP and diuresis F/U in 1 week

## 2020-11-24 LAB — CBC WITH DIFFERENTIAL
Basophils Absolute: 0 10*3/uL (ref 0.0–0.2)
Basos: 1 %
EOS (ABSOLUTE): 0.1 10*3/uL (ref 0.0–0.4)
Eos: 3 %
Hematocrit: 40.4 % (ref 34.0–46.6)
Hemoglobin: 14 g/dL (ref 11.1–15.9)
Immature Grans (Abs): 0 10*3/uL (ref 0.0–0.1)
Immature Granulocytes: 0 %
Lymphocytes Absolute: 1.5 10*3/uL (ref 0.7–3.1)
Lymphs: 31 %
MCH: 31.3 pg (ref 26.6–33.0)
MCHC: 34.7 g/dL (ref 31.5–35.7)
MCV: 90 fL (ref 79–97)
Monocytes Absolute: 0.4 10*3/uL (ref 0.1–0.9)
Monocytes: 8 %
Neutrophils Absolute: 2.9 10*3/uL (ref 1.4–7.0)
Neutrophils: 57 %
RBC: 4.47 x10E6/uL (ref 3.77–5.28)
RDW: 12.6 % (ref 11.7–15.4)
WBC: 5 10*3/uL (ref 3.4–10.8)

## 2020-11-24 LAB — COMPREHENSIVE METABOLIC PANEL
ALT: 28 IU/L (ref 0–32)
AST: 17 IU/L (ref 0–40)
Albumin/Globulin Ratio: 1.9 (ref 1.2–2.2)
Albumin: 4.2 g/dL (ref 3.8–4.8)
Alkaline Phosphatase: 69 IU/L (ref 44–121)
BUN/Creatinine Ratio: 13 (ref 12–28)
BUN: 11 mg/dL (ref 8–27)
Bilirubin Total: 0.3 mg/dL (ref 0.0–1.2)
CO2: 25 mmol/L (ref 20–29)
Calcium: 9.3 mg/dL (ref 8.7–10.3)
Chloride: 102 mmol/L (ref 96–106)
Creatinine, Ser: 0.86 mg/dL (ref 0.57–1.00)
Globulin, Total: 2.2 g/dL (ref 1.5–4.5)
Glucose: 91 mg/dL (ref 65–99)
Potassium: 4 mmol/L (ref 3.5–5.2)
Sodium: 141 mmol/L (ref 134–144)
Total Protein: 6.4 g/dL (ref 6.0–8.5)
eGFR: 73 mL/min/{1.73_m2} (ref >59)

## 2020-11-24 LAB — BRAIN NATRIURETIC PEPTIDE

## 2020-11-24 LAB — HIGH SENSITIVITY CRP: CRP, High Sensitivity: 3.79 mg/L — ABNORMAL HIGH (ref 0.00–3.00)

## 2020-11-29 LAB — PRO B NATRIURETIC PEPTIDE: NT-Pro BNP: 71 pg/mL (ref 0–301)

## 2020-11-29 LAB — SPECIMEN STATUS REPORT

## 2020-11-29 NOTE — Progress Notes (Signed)
Labs show inflammation present, but no indication of infection or anemia at this time.   Original BNP changed- not showing on current results, but reviewed with no indication of heart failure.   Consider fatigue possible post COVID/Long COVID given her infection last fall. Trial of inhalers started. Consider pulmonology referral if continues.

## 2020-11-30 ENCOUNTER — Telehealth (HOSPITAL_BASED_OUTPATIENT_CLINIC_OR_DEPARTMENT_OTHER): Payer: Self-pay

## 2020-11-30 NOTE — Telephone Encounter (Signed)
Results released by Sarabeth Early, AGNP and reviewed by patient via MyChart. Instructed patient to contact the office with any questions or concerns.  

## 2020-11-30 NOTE — Progress Notes (Signed)
BNP is normal- no indication of heart failure causing symptoms.

## 2020-11-30 NOTE — Telephone Encounter (Signed)
-----   Message from Orma Render, NP sent at 11/29/2020  8:01 AM EDT ----- Labs show inflammation present, but no indication of infection or anemia at this time.   Original BNP changed- not showing on current results, but reviewed with no indication of heart failure.   Consider fatigue possible post COVID/Long COVID given her infection last fall. Trial of inhalers started. Consider pulmonology referral if continues.

## 2020-12-07 ENCOUNTER — Telehealth (HOSPITAL_BASED_OUTPATIENT_CLINIC_OR_DEPARTMENT_OTHER): Payer: Self-pay

## 2020-12-07 NOTE — Telephone Encounter (Signed)
Patient called and states the medication is working well, but she wants a prescription for Trelegy Elipta.  She also wants a call back to discuss a referral to the pulmonologist .

## 2020-12-10 ENCOUNTER — Other Ambulatory Visit (HOSPITAL_BASED_OUTPATIENT_CLINIC_OR_DEPARTMENT_OTHER): Payer: Self-pay

## 2020-12-13 ENCOUNTER — Other Ambulatory Visit (HOSPITAL_BASED_OUTPATIENT_CLINIC_OR_DEPARTMENT_OTHER): Payer: Self-pay | Admitting: Nurse Practitioner

## 2020-12-13 ENCOUNTER — Other Ambulatory Visit (HOSPITAL_BASED_OUTPATIENT_CLINIC_OR_DEPARTMENT_OTHER): Payer: Self-pay

## 2020-12-13 ENCOUNTER — Telehealth (HOSPITAL_BASED_OUTPATIENT_CLINIC_OR_DEPARTMENT_OTHER): Payer: Self-pay

## 2020-12-13 DIAGNOSIS — I10 Essential (primary) hypertension: Secondary | ICD-10-CM

## 2020-12-13 DIAGNOSIS — R059 Cough, unspecified: Secondary | ICD-10-CM

## 2020-12-13 MED ORDER — TRELEGY ELLIPTA 200-62.5-25 MCG/INH IN AEPB: 1.0000 | INHALATION_SPRAY | Freq: Every day | RESPIRATORY_TRACT | 11 refills | Status: DC

## 2020-12-13 NOTE — Telephone Encounter (Signed)
Please call the patient and let her know I have sent the prescription to the pharmacy. I have also placed the referral for the Pulmonologist.   Please schedule a phone visit this week if she would like to discuss the pulmonologist.   Thank you

## 2020-12-13 NOTE — Progress Notes (Signed)
Referral to pulmonology placed for ongoing respiratory issues after suspected COVID-19 viral infection last fall. Responded well to Trelegy Ellipta 200mg  dosage- refill sent to pharmacy.

## 2020-12-13 NOTE — Telephone Encounter (Signed)
Called patient to make her aware that her prescription is ready to be picked up.  She will call in the morning to schedule a phone visit with Worthy Keeler, AGNP regarding the pulmonologist referral.

## 2020-12-14 ENCOUNTER — Other Ambulatory Visit (HOSPITAL_BASED_OUTPATIENT_CLINIC_OR_DEPARTMENT_OTHER): Payer: Self-pay

## 2020-12-14 ENCOUNTER — Telehealth (HOSPITAL_BASED_OUTPATIENT_CLINIC_OR_DEPARTMENT_OTHER): Payer: Self-pay | Admitting: Nurse Practitioner

## 2020-12-14 NOTE — Telephone Encounter (Signed)
LVMTCB pt to sch a virtual appt for a referral for pulmonary. Please advise.

## 2020-12-14 NOTE — Telephone Encounter (Signed)
Pt called back and sch virtual appt for 6/30

## 2020-12-15 ENCOUNTER — Other Ambulatory Visit (HOSPITAL_BASED_OUTPATIENT_CLINIC_OR_DEPARTMENT_OTHER): Payer: Self-pay | Admitting: Nurse Practitioner

## 2020-12-15 ENCOUNTER — Other Ambulatory Visit (HOSPITAL_BASED_OUTPATIENT_CLINIC_OR_DEPARTMENT_OTHER): Payer: Self-pay

## 2020-12-15 DIAGNOSIS — I10 Essential (primary) hypertension: Secondary | ICD-10-CM

## 2020-12-16 ENCOUNTER — Telehealth (INDEPENDENT_AMBULATORY_CARE_PROVIDER_SITE_OTHER): Payer: PPO | Admitting: Nurse Practitioner

## 2020-12-16 ENCOUNTER — Encounter (HOSPITAL_BASED_OUTPATIENT_CLINIC_OR_DEPARTMENT_OTHER): Payer: Self-pay | Admitting: Nurse Practitioner

## 2020-12-16 ENCOUNTER — Other Ambulatory Visit (HOSPITAL_BASED_OUTPATIENT_CLINIC_OR_DEPARTMENT_OTHER): Payer: Self-pay

## 2020-12-16 ENCOUNTER — Other Ambulatory Visit: Payer: Self-pay

## 2020-12-16 VITALS — Ht 67.0 in | Wt 241.0 lb

## 2020-12-16 DIAGNOSIS — R0602 Shortness of breath: Secondary | ICD-10-CM | POA: Diagnosis not present

## 2020-12-16 DIAGNOSIS — R5383 Other fatigue: Secondary | ICD-10-CM | POA: Diagnosis not present

## 2020-12-16 DIAGNOSIS — F339 Major depressive disorder, recurrent, unspecified: Secondary | ICD-10-CM

## 2020-12-16 DIAGNOSIS — R059 Cough, unspecified: Secondary | ICD-10-CM | POA: Diagnosis not present

## 2020-12-16 DIAGNOSIS — I10 Essential (primary) hypertension: Secondary | ICD-10-CM

## 2020-12-16 DIAGNOSIS — R4189 Other symptoms and signs involving cognitive functions and awareness: Secondary | ICD-10-CM

## 2020-12-16 DIAGNOSIS — F411 Generalized anxiety disorder: Secondary | ICD-10-CM | POA: Diagnosis not present

## 2020-12-16 DIAGNOSIS — R6889 Other general symptoms and signs: Secondary | ICD-10-CM

## 2020-12-16 MED ORDER — DESVENLAFAXINE SUCCINATE ER 50 MG PO TB24
50.0000 mg | ORAL_TABLET | Freq: Every day | ORAL | 0 refills | Status: DC
  Filled 2020-12-16: qty 30, 30d supply, fill #0

## 2020-12-16 MED ORDER — DESVENLAFAXINE SUCCINATE ER 100 MG PO TB24
100.0000 mg | ORAL_TABLET | Freq: Every morning | ORAL | 12 refills | Status: DC
  Filled 2020-12-16: qty 30, 30d supply, fill #0

## 2020-12-16 MED ORDER — CHLORTHALIDONE 25 MG PO TABS
25.0000 mg | ORAL_TABLET | Freq: Every day | ORAL | 12 refills | Status: DC
  Filled 2020-12-16 (×2): qty 30, 30d supply, fill #0
  Filled 2021-01-10: qty 90, 90d supply, fill #0
  Filled 2021-06-07: qty 90, 90d supply, fill #1

## 2020-12-16 NOTE — Assessment & Plan Note (Signed)
See Depression, recurrent for full A&P

## 2020-12-16 NOTE — Assessment & Plan Note (Signed)
Out of Pristiq for one month PLAN: Restart today at 50mg  dose and increase back to 100mg  dose in 30 days.  F/U with psychiatry

## 2020-12-16 NOTE — Patient Instructions (Signed)
I have sent the referral to pulmonology. We may need to consider a neurology referral if they are unable to determine your a respiratory issue is causing your symptoms.   I have changed your chlorthalidone to 1 tab (25mg ) every day. I sent a new prescription to the pharmacy.  I have restarted your Pristiq- we will start at 50mg  per day for the first month then plan to increase back to the 100mg  tablets. I sent this prescription to the pharmacy.   I would like you to keep checking your blood pressure and send me a message in 1-2 weeks to let me know if it has gotten any better. Our goal is less than 130/80 for you.   Let me know if you have any questions. I suspect that you may have a component of sleep apnea that could be contributing to your symptoms and have sent a note to pulmonology about this. They should be in touch with you in the next week. If you don't hear from them, please let me know.

## 2020-12-16 NOTE — Progress Notes (Signed)
Patient states she wants a referral to the pulmonologist.  She has no other concerns at the moment.

## 2020-12-16 NOTE — Progress Notes (Signed)
Virtual Visit via Telephone Note  I connected with  Emily [name] on 12/16/20 at  9:50 AM EDT by telephone and verified that I am speaking with the correct person using two identifiers.   I discussed the limitations, risks, security and privacy concerns of performing an evaluation and management service by telephone and the availability of in person appointments. I also discussed with the patient that there may be a patient responsible charge related to this service. The patient expressed understanding and agreed to proceed.  Participating parties included in this telephone visit include: The patient and the nurse practitioner listed.  The patient is: At home I am: In the office  Subjective:    CC: Pulmonology Referral  HPI: Emily [name] is a 70 y.o. year old female presenting today via telephone visit to discuss a request for pulmonology referral.  [name] tells me that she "just doesn't feel like myself" lately and endorses increased fatigue, forgetfulness, mental fog, shortness of breath, and cough. She reports that overall she just does not feel well.   She tells me she is sleeping well at night. Endorses taking trazodone at bedtime to sleep. She reports waking feeling resting in the mornings, but is fatigued all throughout the day.  She reports that she tires out easily and is unable to walk more than 75 to 100 feet without being exhausted.   She has had ongoing shortness of breath with exertion and at rest since having a respiratory infection last fall- this was thought to be COVID based on symptoms, but test was negative.  She was recently started on inhalers for trial and she reports these have been helpful for some of the respiratory symptoms, but the fatigue and "feeling off" is still present.  She tells me that it is difficult for her to differentiate between anxiety symptoms and her breathing difficulties at times. She does endorse that her breathing feels better  when she takes PRN xanax.   Her blood pressure has also been elevated and she was recently started on chlorthalidone 12.5mg . She states that since starting that she has been checking her blood pressures at home and they are still raging in the [id number]'[id number] systolic. She has noticed that some of the swelling in her ankles has gone down at the end of the day.   She tells me that her husband tells her she snores and "gurgles" at night while sleeping.  She is also experiencing increased confusion. She states that she forgets what she is saying in the middle of speaking quite often. Occasionally (about once a week) she endorses lapses in memory and forgetting where she and confusion. She states that this is very frustrating.   She reports that about 3 years ago she was experiencing similar symptoms and Dr. Toy Care with psychiatry went through a series of questions with her and told her that her symptoms were consistent with a focus problem and not memory.   She has noticed some weakness and numbness in her hands intermittently. She tells me it seems to go from one side to the other and lasts for only a few minutes and subsides with usually stretching her fingers or washing her hands.   She denies any swallowing difficulties, whole body weakness, vision changes, speech difficulties, swallowing difficulties, confusion during periods of episodes on her hands, facial droop, falling, or losing time.  She denies wet cough or feeling like there is fluid in her lungs, CP, Palpitations, dizziness, sweating, nausea, or vomiting. Emily [name]  She does tell me that she ran out of Pristiq about a month ago. She is only using her xanax about twice a week for severe anxiety.  She reports her memory issues are less when she is using the xanax.   Past medical history, Surgical history, Family history not pertinant except as noted below, Social history, Allergies, and medications have been entered into the medical record, reviewed, and  corrections made.   Review of Systems:  All review of systems negative except what is listed in the HPI  Objective:    General:  Patient speaking clearly in complete sentences. Mild shortness of breath noted with long speech/sentences. No crackles or wheezing audible. Alert and oriented: Time: yes, Place: yes, Month: yes, Year: yes, Recall: yes, Months backward: yes, Count backwards: yes   Normal judgment.  No apparent acute distress.  Impression and Recommendations:    Problem List Items Addressed This Visit     Depression, recurrent ([place])    Out of Pristiq for one month PLAN: Restart today at 50mg  dose and increase back to 100mg  dose in 30 days.  F/U with psychiatry       Relevant Medications   desvenlafaxine (PRISTIQ) 50 MG 24 hr tablet   desvenlafaxine (PRISTIQ) 100 MG 24 hr tablet   Fatigue - Primary    Ongoing fatigue and generalized feeling of unwell since last fall.  Clearly there is something going on at this time for the continued symptoms, but etiology is unclear.  Appreciate pulmonology evaluation- considering possible OSA given the snoring at night.  Given her mental changes discussed neurology referral. Also consider cardiology referral given her HTN and LE edema.  F/U in 3 months or sooner if needed.       Relevant Medications   desvenlafaxine (PRISTIQ) 50 MG 24 hr tablet   desvenlafaxine (PRISTIQ) 100 MG 24 hr tablet   Other Relevant Orders   Ambulatory referral to Pulmonology   Hypertension    No significant change in BP with chlorthalidone 12.5mg - will increase to 25mg  PLAN: Monitor BP at home on daily basis F/U in 2 weeks with MyChart note with BP readings F/U in 3 months with in person visit for labs       Relevant Medications   chlorthalidone (HYGROTON) 25 MG tablet   Other Relevant Orders   Ambulatory referral to Pulmonology   Cough    Ongoing dry cough with intermittent ShOB with rest and frequently with exertion.  She has had LE edema  at the end of the day, but this has somewhat resolved with 12.5mg  chlorthalidone. Increasing chlorthalidone to 25mg  for BP control.  Not waking with edema. No crackles noted by patient or audible by provider today.  Trelegy 200mg  has been helpful in symptoms, therefore recommend further evaluation for underlying pulmonic condition. This has been an ongoing issues with respiratory illness last fall. No signs of infection present.  F/U in 3 months or sooner if needed.        Relevant Orders   Ambulatory referral to Pulmonology   Forgetfulness    Increased forgetfullness with other generalized symptoms present.  Multiple factors considered include exacerbation of depression, OSA, CHF, and neurological condition. At this time no clear indicators.  Will start with pulmonology referral as this was the initial concern last fall with the start of the URI.  Excellent recall and orientation today.  F/U in 3 months or sooner if needed.        Relevant Orders   Ambulatory referral  to Pulmonology   Brain fog    See forgetfulness for full A&P       Relevant Orders   Ambulatory referral to Pulmonology   Generalized anxiety disorder    See Depression, recurrent for full A&P       Relevant Medications   desvenlafaxine (PRISTIQ) 50 MG 24 hr tablet   desvenlafaxine (PRISTIQ) 100 MG 24 hr tablet   Other Visit Diagnoses     Shortness of breath       Relevant Orders   Ambulatory referral to Pulmonology            I discussed the assessment and treatment plan with the patient. The patient was provided an opportunity to ask questions and all were answered. The patient agreed with the plan and demonstrated an understanding of the instructions.   The patient was advised to call back or seek an in-person evaluation if the symptoms worsen or if the condition fails to improve as anticipated.  I provided 40 minutes of non-face-to-face time during this TELEPHONE encounter.    Orma Render,  NP

## 2020-12-16 NOTE — Assessment & Plan Note (Signed)
See forgetfulness for full A&P

## 2020-12-16 NOTE — Assessment & Plan Note (Signed)
Ongoing fatigue and generalized feeling of unwell since last fall.  Clearly there is something going on at this time for the continued symptoms, but etiology is unclear.  Appreciate pulmonology evaluation- considering possible OSA given the snoring at night.  Given her mental changes discussed neurology referral. Also consider cardiology referral given her HTN and LE edema.  F/U in 3 months or sooner if needed.

## 2020-12-16 NOTE — Assessment & Plan Note (Addendum)
Increased forgetfullness with other generalized symptoms present.  Multiple factors considered include exacerbation of depression, OSA, and neurological condition. At this time no clear indicators. BNP was normal at last visit.  Will start with pulmonology referral as this was the initial concern last fall with the start of the URI.  Excellent recall and orientation today.  F/U in 3 months or sooner if needed.

## 2020-12-16 NOTE — Assessment & Plan Note (Signed)
No significant change in BP with chlorthalidone 12.5mg - will increase to 25mg  PLAN: Monitor BP at home on daily basis F/U in 2 weeks with MyChart note with BP readings F/U in 3 months with in person visit for labs

## 2020-12-16 NOTE — Assessment & Plan Note (Addendum)
Ongoing dry cough with intermittent ShOB with rest and frequently with exertion.  She has had LE edema at the end of the day, but this has somewhat resolved with 12.5mg  chlorthalidone. Increasing chlorthalidone to 25mg  for BP control.  Not waking with edema. BNP normal at last visit.  No crackles noted by patient or audible by provider today.  Trelegy 200mg  has been helpful in symptoms, therefore recommend further evaluation for underlying pulmonic condition. This has been an ongoing issues with respiratory illness last fall. No signs of infection present.  F/U in 3 months or sooner if needed.

## 2020-12-31 ENCOUNTER — Other Ambulatory Visit (HOSPITAL_BASED_OUTPATIENT_CLINIC_OR_DEPARTMENT_OTHER): Payer: Self-pay

## 2021-01-06 ENCOUNTER — Encounter (HOSPITAL_BASED_OUTPATIENT_CLINIC_OR_DEPARTMENT_OTHER): Payer: Self-pay | Admitting: Nurse Practitioner

## 2021-01-06 ENCOUNTER — Other Ambulatory Visit: Payer: Self-pay

## 2021-01-06 ENCOUNTER — Ambulatory Visit (INDEPENDENT_AMBULATORY_CARE_PROVIDER_SITE_OTHER): Payer: PPO | Admitting: Nurse Practitioner

## 2021-01-06 ENCOUNTER — Other Ambulatory Visit (HOSPITAL_BASED_OUTPATIENT_CLINIC_OR_DEPARTMENT_OTHER): Payer: Self-pay

## 2021-01-06 VITALS — BP 132/99 | Wt 241.2 lb

## 2021-01-06 DIAGNOSIS — R5383 Other fatigue: Secondary | ICD-10-CM

## 2021-01-06 DIAGNOSIS — F411 Generalized anxiety disorder: Secondary | ICD-10-CM | POA: Diagnosis not present

## 2021-01-06 DIAGNOSIS — H6693 Otitis media, unspecified, bilateral: Secondary | ICD-10-CM

## 2021-01-06 DIAGNOSIS — F339 Major depressive disorder, recurrent, unspecified: Secondary | ICD-10-CM | POA: Diagnosis not present

## 2021-01-06 MED ORDER — AZITHROMYCIN 250 MG PO TABS
ORAL_TABLET | ORAL | 0 refills | Status: AC
  Filled 2021-01-06: qty 6, 5d supply, fill #0

## 2021-01-06 MED ORDER — BUPROPION HCL ER (XL) 150 MG PO TB24
300.0000 mg | ORAL_TABLET | Freq: Every morning | ORAL | 3 refills | Status: DC
Start: 2021-01-06 — End: 2021-12-28
  Filled 2021-01-06: qty 60, 30d supply, fill #0

## 2021-01-06 MED ORDER — DESVENLAFAXINE SUCCINATE ER 100 MG PO TB24
100.0000 mg | ORAL_TABLET | Freq: Every morning | ORAL | 12 refills | Status: DC
  Filled 2021-01-06: qty 30, 30d supply, fill #0
  Filled 2021-03-16: qty 30, 30d supply, fill #1
  Filled 2021-06-07: qty 30, 30d supply, fill #2

## 2021-01-06 NOTE — Assessment & Plan Note (Signed)
Underlying anxiety has been well controlled until recent exhaustion and exacerbation of symptoms. Symptoms primarily depressive in nature, but concern for worsening anxiety if depression is not managed. Discussed at length the importance of self-care and providing limits on work time.  Recommend that she take some time off of work and focus on self care and her mental and physical health. I am concerned that if she continues at this rate she will become quite ill.  Recommend that she take 2 weeks off of work, increase bupropion to 300mg  per day. Letter provided for work and discussed FMLA paperwork that can be retrieved if needed.  F/U in 2 weeks with phone call and 4 weeks with visit.

## 2021-01-06 NOTE — Assessment & Plan Note (Signed)
Ongoing fatigue worsening significantly.  I am concerned underlying factors are from her mental health standpoint due to increased stress and depression symptoms.  Discussed the long hours she has been working and limited self-care. Recommendations for increased bupropion and 2 weeks off from work for recovery.  Will complete FMLA paperwork if needed.  F/U in 2 weeks with phone call and 4 weeks with visit.

## 2021-01-06 NOTE — Assessment & Plan Note (Signed)
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  I am concerned that her work load is contributing to her health issues and keeping her from fully recovering as she should.  Discussed risks associated with long work hours and lack of self-care. Recommend increase bupropion to 300mg  daily and taking 2 weeks off of work to allow for rest and recovery.  Discussed that FMLA paperwork can be completed if needed by employer. F/U in 2 weeks with phone call and 4 weeks with visit for further evaluation.  Additional time may be required based on patient response to rest and increased treatment.

## 2021-01-06 NOTE — Patient Instructions (Signed)
Increase the wellbutrin to 300mg  per day (2 tabs)  I want you to rest and relax and call me if you need me!!

## 2021-01-06 NOTE — Assessment & Plan Note (Signed)
Erythematous and bulging TM bilaterally with milky appearance to fluid behind the TM L>R Suspect otitis and sinusitis. Patient is likely very worn down and therefore will start antibiotic treatment immediately to prevent worsening of condition.  Recommend rest and hydration.  F/U if symptoms worsen or fail to improve.

## 2021-01-06 NOTE — Progress Notes (Signed)
Established Patient Office Visit  Subjective:  Patient ID: Emily [name], female    DOB: [birth year]  Age: 70 y.o. MRN: [id number]  CC:  Chief Complaint  Patient presents with   Fatigue    HPI Emily [name] presents for severe fatigue and mood changes in recent weeks.  Emily [name] tells me that she is experiencing worsening signs of depression and physical and mental exhaustion. She endorses brain fog and focus problems, in addition to crying on a regular basis. She and her husband tell me that she has been working a significant number of hours recently and about about 2 weeks ago she had an "event" after many long hours.  She reports she had worked 4, 10 hour days and one 13 hour day in a row. These days she spent much of her time in the heat and she isn't sure if it was heat exhaustion, but she reports after coming home on the last day she was flushed and had no energy. She reports for the following three days her fatigue was severe and she  "Felt like I was in a tube and couldn't walk straight". She states that she laid on the couch and slept most of the time. She reports that she had no hunger and no thirst, but extreme fatigue to the point that she could not get up.   She tells me she has also been experiencing a sore throat, bilateral ear pain, and a fullness in her sinuses.   Her husband, Emily [name], is concerned over the amount of hours that she has been working, citing long days and a significant change in Emily [name]'s mood and abilities. Emily [name] reports that she needs to continue working, but she is not sure she can continue as she has been. Emily [name] tells me that when she was younger Emily [name] had a similar episode where she had worn herself down to the point of sheer physical and mental exhaustion and was unable to get out of bed.   Emily [name] tells me her depression symptoms have significantly worsened and she is unsure if this is contributing to her feeling so poorly, or if this is due to  exhaustion.   Past Medical History:  Diagnosis Date   Acute conjunctivitis of both eyes 02/26/2020   Acute non-recurrent frontal sinusitis 03/08/2020   Acute upper respiratory infection 12/01/2009   Qualifier: Diagnosis of  By: Elease Hashimoto MD, Bruce     Allergy    seasonal    Ankle sprain 05/18/2011   right  grade 2?  needs help with  fu  has bilateral extremity injuries soft tissue presumed    Anxiety    ARTHRALGIA 11/10/2009   Qualifier: Diagnosis of  By: Elease Hashimoto MD, Bruce     Cancer [facility]) 2009   squamous cell skin   Cataract    bilateral surgery, left unsuccessful   Chest wall soft tissue injury 05/18/2011   right  org neg for rib fracture     Cystitis 02/12/2020   Depression    DYSPNEA 11/10/2009   Qualifier: Diagnosis of  By: Elease Hashimoto MD, Bruce     Eating disorder    Encounter for laboratory test 02/12/2020   FOOT PAIN, BILATERAL 09/07/2008   Qualifier: Diagnosis of  By: Elease Hashimoto MD, Bruce     GERD (gastroesophageal reflux disease)    Glaucoma    Head injury 07/23/2018   Hiatal hernia    History of cardiovascular stress test 04/2000   cardiolyte   History of flexible sigmoidoscopy 09/21/1999  INGROWN NAIL 05/19/2009   Qualifier: Diagnosis of  By: Sarajane Jews MD, Annie Main A    Non-recurrent acute suppurative otitis media of both ears without spontaneous rupture of tympanic membranes 02/12/2020   Other fatigue 02/26/2020   Otitis externa 03/08/2020   Redness of both eyes 03/08/2020   Right knee injury 08/14/2019   Sprain of foot, left 05/18/2011   WEIGHT GAIN 11/10/2009   Qualifier: Diagnosis of  By: Elease Hashimoto MD, Bruce      Past Surgical History:  Procedure Laterality Date   ABDOMINAL HYSTERECTOMY     APPENDECTOMY     CARDIAC CATHETERIZATION  04/2000   CARDIAC CATHETERIZATION     COLONOSCOPY  12/31/09   UPPER GASTROINTESTINAL ENDOSCOPY  09/15/1999   WISDOM TOOTH EXTRACTION      Family History  Problem Relation Age of Onset   Arthritis Other    Cancer Other        lung    Coronary artery disease Other    Stroke Other    Colon cancer Neg Hx    Colon polyps Neg Hx    Esophageal cancer Neg Hx    Rectal cancer Neg Hx    Stomach cancer Neg Hx     Social History   Socioeconomic History   Marital status: Married    Spouse name: Not on file   Number of children: Not on file   Years of education: Not on file   Highest education level: Not on file  Occupational History   Not on file  Tobacco Use   Smoking status: Former    Packs/day: 0.50    Years: 20.00    Pack years: 10.00    Types: Cigarettes    Quit date: 05/27/2008    Years since quitting: 12.6   Smokeless tobacco: Never  Vaping Use   Vaping Use: Never used  Substance and Sexual Activity   Alcohol use: Yes    Comment: occ   Drug use: No   Sexual activity: Not on file  Other Topics Concern   Not on file  Social History Narrative   Not on file   Social Determinants of Health   Financial Resource Strain: Not on file  Food Insecurity: Not on file  Transportation Needs: Not on file  Physical Activity: Not on file  Stress: Not on file  Social Connections: Not on file  Intimate Partner Violence: Not on file    Outpatient Medications Prior to Visit  Medication Sig Dispense Refill   ALPRAZolam (XANAX) 1 MG tablet Take 1 mg by mouth 3 (three) times daily as needed. For anxiety.     azelastine (OPTIVAR) 0.05 % ophthalmic solution Place 2 drops into both eyes 2 (two) times daily. 6 mL 11   Blood Pressure Monitor MISC Use as directed 1 each 0   chlorthalidone (HYGROTON) 25 MG tablet Take 1 tablet (25 mg total) by mouth daily. 30 tablet 12   D3-50 50000 units capsule Take 50,000 Units by mouth once a week.  12   Fluticasone-Umeclidin-Vilant (TRELEGY ELLIPTA) 200-62.5-25 MCG/INH AEPB Inhale 1 puff into the lungs daily. 28 each 11   furosemide (LASIX) 20 MG tablet TAKE 1 TABLET BY MOUTH EVERY DAY AS NEEDED 30 tablet 0   Netarsudil-Latanoprost (ROCKLATAN) 0.02-0.005 % SOLN Apply 1 drop to eye. 1  drop daily both eyes     prednisoLONE acetate (PRED FORTE) 1 % ophthalmic suspension Place 1 drop into the right eye 4 (four) times daily.     traZODone (DESYREL) 100  MG tablet Take 300 mg by mouth at bedtime.     triamcinolone cream (KENALOG) 0.1 % APPLY AS DIRECTED TO AFFECTED AREA TWICE A DAY FOR TWO WEEKS THEN AS NEEDED FOR FLARES     buPROPion (WELLBUTRIN XL) 150 MG 24 hr tablet Take 1 tablet by mouth every morning.     desvenlafaxine (PRISTIQ) 100 MG 24 hr tablet Take 1 tablet (100 mg total) by mouth every morning. 30 tablet 12   desvenlafaxine (PRISTIQ) 50 MG 24 hr tablet Take 1 tablet (50 mg total) by mouth daily. 30 tablet 0   No facility-administered medications prior to visit.    Allergies  Allergen Reactions   Doxycycline     Gi upset   Penicillins     REACTION: rash, aggression Has patient had a PCN reaction causing immediate rash, facial/tongue/throat swelling, SOB or lightheadedness with hypotension: YES Has patient had a PCN reaction causing severe rash involving mucus membranes or skin necrosis: NO Has patient had a PCN reaction that required hospitalizationNO Has patient had a PCN reaction occurring within the last 10 years:NO If all of the above answers are "NO", then may proceed with Cephalosporin use.    Prednisone     REACTION: agressive behavior    ROS Review of Systems All review of systems negative except what is listed in the HPI    Objective:    Physical Exam Vitals and nursing note reviewed.  Constitutional:      Appearance: Normal appearance.  HENT:     Head: Normocephalic and atraumatic.     Right Ear: Tenderness present. A middle ear effusion is present. Tympanic membrane is erythematous and bulging.     Left Ear: Tenderness present. A middle ear effusion is present. Tympanic membrane is erythematous and bulging.     Nose: Congestion present.     Mouth/Throat:     Mouth: Mucous membranes are moist.     Pharynx: Oropharynx is clear. Posterior  oropharyngeal erythema present.  Eyes:     Extraocular Movements: Extraocular movements intact.     Conjunctiva/sclera: Conjunctivae normal.     Pupils: Pupils are equal, round, and reactive to light.  Neck:     Vascular: No carotid bruit.  Cardiovascular:     Rate and Rhythm: Normal rate and regular rhythm.     Pulses: Normal pulses.     Heart sounds: Normal heart sounds.  Pulmonary:     Effort: Pulmonary effort is normal.     Breath sounds: Normal breath sounds.  Abdominal:     General: Abdomen is flat.     Palpations: Abdomen is soft.  Musculoskeletal:        General: Normal range of motion.     Cervical back: Normal range of motion. No tenderness.     Right lower leg: No edema.     Left lower leg: No edema.  Lymphadenopathy:     Cervical: No cervical adenopathy.  Skin:    General: Skin is warm and dry.     Capillary Refill: Capillary refill takes less than 2 seconds.  Neurological:     General: No focal deficit present.     Mental Status: She is alert and oriented to person, place, and time.     Cranial Nerves: No cranial nerve deficit.     Sensory: No sensory deficit.     Motor: No weakness.     Coordination: Coordination normal.     Gait: Gait normal.     Deep Tendon Reflexes: Reflexes  normal.  Psychiatric:        Attention and Perception: Attention normal.        Mood and Affect: Mood is depressed. Affect is tearful.        Speech: Speech normal.        Behavior: Behavior normal.        Thought Content: Thought content normal.        Cognition and Memory: Cognition and memory normal.        Judgment: Judgment normal.    There were no vitals taken for this visit. Wt Readings from Last 3 Encounters:  12/16/20 241 lb (109.3 kg)  11/23/20 250 lb 6.4 oz (113.6 kg)  08/27/20 248 lb 3.2 oz (112.6 kg)     Health Maintenance Due  Topic Date Due   Hepatitis C Screening  Never done   Zoster Vaccines- Shingrix (1 of 2) Never done   DEXA SCAN  Never done    COVID-19 Vaccine (4 - Booster for Pfizer series) 09/02/2020    There are no preventive care reminders to display for this patient.  Lab Results  Component Value Date   TSH 3.10 01/27/2016   Lab Results  Component Value Date   WBC 5.0 11/23/2020   HGB 14.0 11/23/2020   HCT 40.4 11/23/2020   MCV 90 11/23/2020   PLT 232 02/12/2020   Lab Results  Component Value Date   NA 141 11/23/2020   K 4.0 11/23/2020   CO2 25 11/23/2020   GLUCOSE 91 11/23/2020   BUN 11 11/23/2020   CREATININE 0.86 11/23/2020   BILITOT 0.3 11/23/2020   ALKPHOS 69 11/23/2020   AST 17 11/23/2020   ALT 28 11/23/2020   PROT 6.4 11/23/2020   ALBUMIN 4.2 11/23/2020   CALCIUM 9.3 11/23/2020   ANIONGAP 9 01/29/2016   EGFR 73 11/23/2020   GFR 64.64 11/14/2018   Lab Results  Component Value Date   CHOL 170 02/12/2020   Lab Results  Component Value Date   HDL 56 02/12/2020   Lab Results  Component Value Date   LDLCALC 93 02/12/2020   Lab Results  Component Value Date   TRIG 116 02/12/2020   Lab Results  Component Value Date   CHOLHDL 3.0 02/12/2020   Lab Results  Component Value Date   HGBA1C 5.3 02/12/2020      Assessment & Plan:   Problem List Items Addressed This Visit     Depression, recurrent ([place]) - Primary    Underlying depressive disorder exacerbated recently with increased work load, long work hours, 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  I am concerned that her work load is contributing to her health issues and keeping her from fully recovering as she should.  Discussed risks associated with long work hours and lack of self-care. Recommend increase bupropion to 375m daily and taking 2 weeks off of work to allow for rest and recovery.  Discussed that FMLA paperwork can be completed if needed by employer. F/U in 2 weeks with phone call and 4 weeks with visit for further evaluation.  Additional time may be required based on patient response to  rest and increased treatment.        Relevant Medications   desvenlafaxine (PRISTIQ) 100 MG 24 hr tablet   buPROPion (WELLBUTRIN XL) 150 MG 24 hr tablet   Fatigue    Ongoing fatigue worsening significantly.  I am concerned underlying factors are from her mental health standpoint due to increased stress  and depression symptoms.  Discussed the long hours she has been working and limited self-care. Recommendations for increased bupropion and 2 weeks off from work for recovery.  Will complete FMLA paperwork if needed.  F/U in 2 weeks with phone call and 4 weeks with visit.        Relevant Medications   desvenlafaxine (PRISTIQ) 100 MG 24 hr tablet   buPROPion (WELLBUTRIN XL) 150 MG 24 hr tablet   Otitis of both ears    Erythematous and bulging TM bilaterally with milky appearance to fluid behind the TM L>R Suspect otitis and sinusitis. Patient is likely very worn down and therefore will start antibiotic treatment immediately to prevent worsening of condition.  Recommend rest and hydration.  F/U if symptoms worsen or fail to improve.        Relevant Medications   azithromycin (ZITHROMAX) 250 MG tablet   Generalized anxiety disorder    Underlying anxiety has been well controlled until recent exhaustion and exacerbation of symptoms. Symptoms primarily depressive in nature, but concern for worsening anxiety if depression is not managed. Discussed at length the importance of self-care and providing limits on work time.  Recommend that she take some time off of work and focus on self care and her mental and physical health. I am concerned that if she continues at this rate she will become quite ill.  Recommend that she take 2 weeks off of work, increase bupropion to 344m per day. Letter provided for work and discussed FMLA paperwork that can be retrieved if needed.  F/U in 2 weeks with phone call and 4 weeks with visit.        Relevant Medications   desvenlafaxine (PRISTIQ) 100 MG 24  hr tablet   buPROPion (WELLBUTRIN XL) 150 MG 24 hr tablet    Meds ordered this encounter  Medications   desvenlafaxine (PRISTIQ) 100 MG 24 hr tablet    Sig: Take 1 tablet (100 mg total) by mouth every morning.    Dispense:  30 tablet    Refill:  12   buPROPion (WELLBUTRIN XL) 150 MG 24 hr tablet    Sig: Take 2 tablets (300 mg total) by mouth every morning.    Dispense:  60 tablet    Refill:  3   azithromycin (ZITHROMAX) 250 MG tablet    Sig: Take 2 tablets on day 1, then 1 tablet daily for the remaining 4 days.    Dispense:  6 tablet    Refill:  0   Time: 65 minutes, >50% spent counseling, care coordination, chart review, and documentation.   Follow-up: Return in about 6 weeks (around 02/17/2021) for Mood.    Emily Render NP

## 2021-01-10 ENCOUNTER — Other Ambulatory Visit (HOSPITAL_BASED_OUTPATIENT_CLINIC_OR_DEPARTMENT_OTHER): Payer: Self-pay

## 2021-01-14 ENCOUNTER — Encounter (HOSPITAL_BASED_OUTPATIENT_CLINIC_OR_DEPARTMENT_OTHER): Payer: Self-pay | Admitting: Nurse Practitioner

## 2021-01-17 ENCOUNTER — Other Ambulatory Visit (HOSPITAL_BASED_OUTPATIENT_CLINIC_OR_DEPARTMENT_OTHER): Payer: Self-pay | Admitting: Nurse Practitioner

## 2021-01-17 DIAGNOSIS — R059 Cough, unspecified: Secondary | ICD-10-CM

## 2021-01-17 DIAGNOSIS — R6889 Other general symptoms and signs: Secondary | ICD-10-CM

## 2021-01-17 DIAGNOSIS — R5382 Chronic fatigue, unspecified: Secondary | ICD-10-CM

## 2021-01-17 DIAGNOSIS — H6693 Otitis media, unspecified, bilateral: Secondary | ICD-10-CM

## 2021-01-17 DIAGNOSIS — R0600 Dyspnea, unspecified: Secondary | ICD-10-CM

## 2021-01-17 DIAGNOSIS — R4189 Other symptoms and signs involving cognitive functions and awareness: Secondary | ICD-10-CM

## 2021-01-17 DIAGNOSIS — R0609 Other forms of dyspnea: Secondary | ICD-10-CM

## 2021-01-17 MED ORDER — CEFDINIR 300 MG PO CAPS: 300.0000 mg | ORAL_CAPSULE | Freq: Two times a day (BID) | ORAL | 0 refills | Status: DC

## 2021-01-28 DIAGNOSIS — H401112 Primary open-angle glaucoma, right eye, moderate stage: Secondary | ICD-10-CM | POA: Diagnosis not present

## 2021-02-02 NOTE — Progress Notes (Signed)
Synopsis: Referred for chronic fatigue, cough, DOE by Orma Render, NP  Subjective:   PATIENT ID: Emily [name]: female DOB: 04/08/1951, MRN: [id number]  Chief Complaint  Patient presents with   Consult    Dry cough, SOB at anytime, wheezing     69yF with history of anxiety, seasonal allergies, GERD, HTN who is referred by PCP for chronic fatigue, cough, DOE.  Seen by PCP 7/21 for otitis, sinusitis, prescribed azithromycin and later cefdinir. Pt had endorsed sore throat, ear pain, sinonasal fullness and exhaustion at time.  Periodically she gets exhausted with chest tightness, fatigue and shortness of breath 'emotions are at the forefront, I hate going to work,' her husband says she takes things personally quite a bit, irritable. These build up over a period of time until she says she gets some rest. She is a Nurse, learning disability. It has been quite busy over the last couple of years. By rest she means being able to leave her work behind completely. Most recently it took being off for 2 weeks. She has started trelegy 6 weeks ago perhaps. Maybe is helping. She is on treatment for glaucoma and asks if any component of trelegy could elevated IOP. No orthopnea. Had some BLE swelling which has responded to chlorthalidone.   Has had a stress test but many years ago.   She takes medicine to sleep - trazodone every night for many years. Frequent awakenings during these episodes of exhaustion, ruminates on work. No PND. She snores but not loud. She doesn't necessarily feel sleepy during these episodes of exhaustion. She says she is prone to panic attacks when she's super tired. Takes a xanax to abort these episodes.   She did smoke in past for 10 years, quit in 2009. Smoked half a pack to 1 ppd. She never had asthma as a kid. She says she is only rarely exposed to embalming fluid.   There is a history of lung cancer not in first degree relatives on her mother's side.     Otherwise  pertinent review of systems is negative.  Past Medical History:  Diagnosis Date   Acute conjunctivitis of both eyes 02/26/2020   Acute non-recurrent frontal sinusitis 03/08/2020   Acute upper respiratory infection 12/01/2009   Qualifier: Diagnosis of  By: Elease Hashimoto MD, Bruce     Allergy    seasonal    Ankle sprain 05/18/2011   right  grade 2?  needs help with  fu  has bilateral extremity injuries soft tissue presumed    Anxiety    ARTHRALGIA 11/10/2009   Qualifier: Diagnosis of  By: Elease Hashimoto MD, Bruce     Cancer [facility]) 2009   squamous cell skin   Cataract    bilateral surgery, left unsuccessful   Chest wall soft tissue injury 05/18/2011   right  org neg for rib fracture     Cystitis 02/12/2020   Depression    DYSPNEA 11/10/2009   Qualifier: Diagnosis of  By: Elease Hashimoto MD, Bruce     Eating disorder    Encounter for laboratory test 02/12/2020   FOOT PAIN, BILATERAL 09/07/2008   Qualifier: Diagnosis of  By: Elease Hashimoto MD, Bruce     GERD (gastroesophageal reflux disease)    Glaucoma    Head injury 07/23/2018   Hiatal hernia    History of cardiovascular stress test 04/2000   cardiolyte   History of flexible sigmoidoscopy 09/21/1999   INGROWN NAIL 05/19/2009   Qualifier: Diagnosis of  By: Sarajane Jews MD, Annie Main  A    Non-recurrent acute suppurative otitis media of both ears without spontaneous rupture of tympanic membranes 02/12/2020   Other fatigue 02/26/2020   Otitis externa 03/08/2020   Redness of both eyes 03/08/2020   Right knee injury 08/14/2019   Sprain of foot, left 05/18/2011   WEIGHT GAIN 11/10/2009   Qualifier: Diagnosis of  By: Elease Hashimoto MD, Bruce       Family History  Problem Relation Age of Onset   Arthritis Other    Cancer Other        lung   Coronary artery disease Other    Stroke Other    Colon cancer Neg Hx    Colon polyps Neg Hx    Esophageal cancer Neg Hx    Rectal cancer Neg Hx    Stomach cancer Neg Hx      Past Surgical History:  Procedure Laterality Date   ABDOMINAL  HYSTERECTOMY     APPENDECTOMY     CARDIAC CATHETERIZATION  04/2000   CARDIAC CATHETERIZATION     COLONOSCOPY  12/31/09   UPPER GASTROINTESTINAL ENDOSCOPY  09/15/1999   WISDOM TOOTH EXTRACTION      Social History   Socioeconomic History   Marital status: Married    Spouse name: Not on file   Number of children: Not on file   Years of education: Not on file   Highest education level: Not on file  Occupational History   Not on file  Tobacco Use   Smoking status: Former    Packs/day: 1.00    Years: 10.00    Pack years: 10.00    Types: Cigarettes    Quit date: 05/27/2008    Years since quitting: 12.6   Smokeless tobacco: Never  Vaping Use   Vaping Use: Never used  Substance and Sexual Activity   Alcohol use: Yes    Comment: occ   Drug use: No   Sexual activity: Not on file  Other Topics Concern   Not on file  Social History Narrative   Not on file   Social Determinants of Health   Financial Resource Strain: Not on file  Food Insecurity: Not on file  Transportation Needs: Not on file  Physical Activity: Not on file  Stress: Not on file  Social Connections: Not on file  Intimate Partner Violence: Not on file     Allergies  Allergen Reactions   Doxycycline     Gi upset   Penicillins     REACTION: rash, aggression Has patient had a PCN reaction causing immediate rash, facial/tongue/throat swelling, SOB or lightheadedness with hypotension: YES Has patient had a PCN reaction causing severe rash involving mucus membranes or skin necrosis: NO Has patient had a PCN reaction that required hospitalizationNO Has patient had a PCN reaction occurring within the last 10 years:NO If all of the above answers are "NO", then may proceed with Cephalosporin use.    Prednisone     REACTION: agressive behavior     Outpatient Medications Prior to Visit  Medication Sig Dispense Refill   ALPRAZolam (XANAX) 1 MG tablet Take 1 mg by mouth 3 (three) times daily as needed. For anxiety.      azelastine (OPTIVAR) 0.05 % ophthalmic solution Place 2 drops into both eyes 2 (two) times daily. 6 mL 11   Blood Pressure Monitor MISC Use as directed 1 each 0   buPROPion (WELLBUTRIN XL) 150 MG 24 hr tablet Take 2 tablets (300 mg total) by mouth every morning. 60 tablet 3  chlorthalidone (HYGROTON) 25 MG tablet Take 1 tablet (25 mg total) by mouth daily. 30 tablet 12   D3-50 50000 units capsule Take 50,000 Units by mouth once a week.  12   desvenlafaxine (PRISTIQ) 100 MG 24 hr tablet Take 1 tablet (100 mg total) by mouth every morning. 30 tablet 12   Fluticasone-Umeclidin-Vilant (TRELEGY ELLIPTA) 200-62.5-25 MCG/INH AEPB Inhale 1 puff into the lungs daily. 28 each 11   traZODone (DESYREL) 100 MG tablet Take 300 mg by mouth at bedtime.     triamcinolone cream (KENALOG) 0.1 % APPLY AS DIRECTED TO AFFECTED AREA TWICE A DAY FOR TWO WEEKS THEN AS NEEDED FOR FLARES     cefdinir (OMNICEF) 300 MG capsule Take 1 capsule (300 mg total) by mouth 2 (two) times daily. 14 capsule 0   furosemide (LASIX) 20 MG tablet TAKE 1 TABLET BY MOUTH EVERY DAY AS NEEDED 30 tablet 0   Netarsudil-Latanoprost (ROCKLATAN) 0.02-0.005 % SOLN Apply 1 drop to eye. 1 drop daily both eyes     prednisoLONE acetate (PRED FORTE) 1 % ophthalmic suspension Place 1 drop into the right eye 4 (four) times daily.     No facility-administered medications prior to visit.       Objective:   Physical Exam:  General appearance: 70 y.o., female, NAD, conversant  Eyes: anicteric sclerae, moist conjunctivae; no lid-lag; PERRL, tracking appropriately HENT: NCAT; oropharynx, MMM, no mucosal ulcerations; normal hard and soft palate. TM clear. Nasal mucosa is boggy, edematous but no purulent mucus.  Neck: Trachea midline; no lymphadenopathy, no JVD Lungs: CTAB, no crackles, no wheeze, with normal respiratory effort CV: RRR, no MRGs  Abdomen: Soft, non-tender; non-distended, BS present  Extremities: No peripheral edema, radial and DP  pulses present bilaterally. +BLE non-pitting edema. Skin: Normal temperature, turgor and texture; no rash Psych: Appropriate affect Neuro: Alert and oriented to person and place, no focal deficit    Vitals:   02/03/21 0855  BP: 118/74  Pulse: 74  Temp: 98.3 F (36.8 C)  TempSrc: Oral  SpO2: 95%  Weight: 242 lb (109.8 kg)  Height: 5' 6.5" (1.689 m)   95% on RA BMI Readings from Last 3 Encounters:  02/03/21 38.47 kg/m  01/06/21 37.78 kg/m  12/16/20 37.75 kg/m   Wt Readings from Last 3 Encounters:  02/03/21 242 lb (109.8 kg)  01/06/21 241 lb 3.2 oz (109.4 kg)  12/16/20 241 lb (109.3 kg)     CBC    Component Value Date/Time   WBC 5.0 11/23/2020 1709   WBC 5.7 02/12/2020 1519   RBC 4.47 11/23/2020 1709   RBC 4.81 02/12/2020 1519   HGB 14.0 11/23/2020 1709   HCT 40.4 11/23/2020 1709   PLT 232 02/12/2020 1519   MCV 90 11/23/2020 1709   MCH 31.3 11/23/2020 1709   MCH 31.0 02/12/2020 1519   MCHC 34.7 11/23/2020 1709   MCHC 33.2 02/12/2020 1519   RDW 12.6 11/23/2020 1709   LYMPHSABS 1.5 11/23/2020 1709   MONOABS 0.5 11/14/2018 0910   EOSABS 0.1 11/23/2020 1709   BASOSABS 0.0 11/23/2020 1709      Chest Imaging: CT A/P 03/2019 reviewed by me and remarkable for clear lungs  CXR 03/2018 reviewed by me and remarkable for clear lungs   Pulmonary Functions Testing Results: No flowsheet data found.    Assessment & Plan:   # Cough: # DOE: # Fatigue: Hard to distinguish these per pt. It is improved overall with some combination of rest, trelegy, and chlorthalidone. She does  still appear mildly volume overloaded on exam - has never had TTE or stress test. Asthma/COPD possible but no prior supportive testing and LABA/LAMA/ICS may pose some risk with her refractory glaucoma. Would also like to eval for sleep-disordered breathing. If workup unrevealing then mood disturbance with anxiety/panic may be another consideration.  # Otitis/sinusitis: appears to have resolved  on exam and her symptoms are improved although she does have some residual boggy/edematous appearing nasal mucosa  Plan: - TSH - home sleep study - PFTs - CXR PA/lateral    Maryjane Hurter, MD [facility] 02/03/2021 9:45 AM

## 2021-02-03 ENCOUNTER — Other Ambulatory Visit: Payer: Self-pay

## 2021-02-03 ENCOUNTER — Ambulatory Visit: Payer: PPO | Admitting: Student

## 2021-02-03 ENCOUNTER — Encounter: Payer: Self-pay | Admitting: Student

## 2021-02-03 VITALS — BP 118/74 | HR 74 | Temp 98.3°F | Ht 66.5 in | Wt 242.0 lb

## 2021-02-03 DIAGNOSIS — R0609 Other forms of dyspnea: Secondary | ICD-10-CM

## 2021-02-03 DIAGNOSIS — R059 Cough, unspecified: Secondary | ICD-10-CM | POA: Diagnosis not present

## 2021-02-03 DIAGNOSIS — R5382 Chronic fatigue, unspecified: Secondary | ICD-10-CM

## 2021-02-03 DIAGNOSIS — R0600 Dyspnea, unspecified: Secondary | ICD-10-CM

## 2021-02-03 LAB — TSH: TSH: 2.19 u[IU]/mL (ref 0.35–5.50)

## 2021-02-03 NOTE — Patient Instructions (Signed)
-   STOP trelegy and let me know if you're having more trouble breathing. Need to be off of it for at least 2 days before your PFTs (breathing tests). - We will schedule PFTs, home sleep study - Head to get your Chest x ray here today - Head to lab here today - I'll call with results - See you in 3 months

## 2021-02-08 ENCOUNTER — Other Ambulatory Visit: Payer: Self-pay

## 2021-02-08 ENCOUNTER — Ambulatory Visit (INDEPENDENT_AMBULATORY_CARE_PROVIDER_SITE_OTHER)
Admission: RE | Admit: 2021-02-08 | Discharge: 2021-02-08 | Disposition: A | Discharge status: Home / Self Care | Payer: PPO | Source: Ambulatory Visit | Attending: Student | Admitting: Student

## 2021-02-08 DIAGNOSIS — R0600 Dyspnea, unspecified: Secondary | ICD-10-CM

## 2021-02-23 DIAGNOSIS — L814 Other melanin hyperpigmentation: Secondary | ICD-10-CM | POA: Diagnosis not present

## 2021-02-23 DIAGNOSIS — L821 Other seborrheic keratosis: Secondary | ICD-10-CM | POA: Diagnosis not present

## 2021-02-23 DIAGNOSIS — Z85828 Personal history of other malignant neoplasm of skin: Secondary | ICD-10-CM | POA: Diagnosis not present

## 2021-02-23 DIAGNOSIS — L570 Actinic keratosis: Secondary | ICD-10-CM | POA: Diagnosis not present

## 2021-02-23 DIAGNOSIS — C44319 Basal cell carcinoma of skin of other parts of face: Secondary | ICD-10-CM | POA: Diagnosis not present

## 2021-02-23 DIAGNOSIS — Z08 Encounter for follow-up examination after completed treatment for malignant neoplasm: Secondary | ICD-10-CM | POA: Diagnosis not present

## 2021-02-23 DIAGNOSIS — D225 Melanocytic nevi of trunk: Secondary | ICD-10-CM | POA: Diagnosis not present

## 2021-02-23 DIAGNOSIS — I872 Venous insufficiency (chronic) (peripheral): Secondary | ICD-10-CM | POA: Diagnosis not present

## 2021-02-23 DIAGNOSIS — D485 Neoplasm of uncertain behavior of skin: Secondary | ICD-10-CM | POA: Diagnosis not present

## 2021-03-07 DIAGNOSIS — H401112 Primary open-angle glaucoma, right eye, moderate stage: Secondary | ICD-10-CM | POA: Diagnosis not present

## 2021-03-15 ENCOUNTER — Emergency Department (HOSPITAL_BASED_OUTPATIENT_CLINIC_OR_DEPARTMENT_OTHER)
Admission: EM | Admit: 2021-03-15 | Discharge: 2021-03-15 | Disposition: A | Discharge status: Home / Self Care | Payer: PPO | Attending: Emergency Medicine | Admitting: Emergency Medicine

## 2021-03-15 ENCOUNTER — Encounter (HOSPITAL_BASED_OUTPATIENT_CLINIC_OR_DEPARTMENT_OTHER): Payer: Self-pay | Admitting: *Deleted

## 2021-03-15 ENCOUNTER — Emergency Department (HOSPITAL_BASED_OUTPATIENT_CLINIC_OR_DEPARTMENT_OTHER): Payer: PPO | Admitting: Radiology

## 2021-03-15 ENCOUNTER — Other Ambulatory Visit: Payer: Self-pay

## 2021-03-15 DIAGNOSIS — Z566 Other physical and mental strain related to work: Secondary | ICD-10-CM

## 2021-03-15 DIAGNOSIS — I10 Essential (primary) hypertension: Secondary | ICD-10-CM | POA: Diagnosis not present

## 2021-03-15 DIAGNOSIS — Z563 Stressful work schedule: Secondary | ICD-10-CM | POA: Diagnosis not present

## 2021-03-15 DIAGNOSIS — R0789 Other chest pain: Secondary | ICD-10-CM | POA: Insufficient documentation

## 2021-03-15 DIAGNOSIS — Z85828 Personal history of other malignant neoplasm of skin: Secondary | ICD-10-CM | POA: Diagnosis not present

## 2021-03-15 DIAGNOSIS — F419 Anxiety disorder, unspecified: Secondary | ICD-10-CM | POA: Diagnosis not present

## 2021-03-15 DIAGNOSIS — R079 Chest pain, unspecified: Secondary | ICD-10-CM | POA: Diagnosis not present

## 2021-03-15 DIAGNOSIS — Z87891 Personal history of nicotine dependence: Secondary | ICD-10-CM | POA: Insufficient documentation

## 2021-03-15 DIAGNOSIS — R61 Generalized hyperhidrosis: Secondary | ICD-10-CM | POA: Diagnosis not present

## 2021-03-15 LAB — BASIC METABOLIC PANEL
Anion gap: 10 (ref 5–15)
BUN: 18 mg/dL (ref 8–23)
CO2: 28 mmol/L (ref 22–32)
Calcium: 9.5 mg/dL (ref 8.9–10.3)
Chloride: 101 mmol/L (ref 98–111)
Creatinine, Ser: 0.81 mg/dL (ref 0.44–1.00)
GFR, Estimated: >60 mL/min (ref >60)
Glucose, Bld: 96 mg/dL (ref 70–99)
Potassium: 3.4 mmol/L — ABNORMAL LOW (ref 3.5–5.1)
Sodium: 139 mmol/L (ref 135–145)

## 2021-03-15 LAB — CBC
HCT: 39.5 % (ref 36.0–46.0)
Hemoglobin: 13.7 g/dL (ref 12.0–15.0)
MCH: 32.1 pg (ref 26.0–34.0)
MCHC: 34.7 g/dL (ref 30.0–36.0)
MCV: 92.5 fL (ref 80.0–100.0)
Platelets: 264 10*3/uL (ref 150–400)
RBC: 4.27 MIL/uL (ref 3.87–5.11)
RDW: 13.2 % (ref 11.5–15.5)
WBC: 6 10*3/uL (ref 4.0–10.5)
nRBC: 0 % (ref 0.0–0.2)

## 2021-03-15 LAB — TROPONIN I (HIGH SENSITIVITY)
Troponin I (High Sensitivity): 4 ng/L (ref <18)
Troponin I (High Sensitivity): 4 ng/L (ref <18)

## 2021-03-15 NOTE — ED Provider Notes (Signed)
[facility] EMERGENCY DEPT Provider Note   CSN: [id number] Arrival date & time: 03/15/21  1825     History Chief Complaint  Patient presents with   Chest Pain    Emily [name] is a 70 y.o. female.  This is a 70 y.o. female with significant medical history as below, including anxiety, depression who presents to the ED with complaint of chest pain.  Location: Left-sided chest wall Duration: 3 to 5 seconds Onset: Sudden Timing: Intermittent Description: Stabbing pain to left upper chest wall with radiation to her left arm. Severity: Mild Exacerbating/Alleviating Factors: Worsened secondary to increased stress at work.  Associated Symptoms: Anxiety, diaphoresis Pertinent Negatives: No fevers, chills, dyspnea.  No nausea or vomiting.  Not worsened by exertion.  Not improved with rest.  Symptoms occurred during stressful situation at work.  Currently asymptomatic.  She has experienced symptoms in the past associated with stress   The history is provided by the patient and the spouse. No language interpreter was used.  Chest Pain Pain location:  L chest Pain quality: sharp and stabbing   Pain radiates to:  L shoulder Onset quality:  Sudden Timing:  Sporadic Progression:  Resolved Chronicity:  Recurrent Associated symptoms: no abdominal pain, no cough, no dysphagia, no fever, no headache, no nausea, no palpitations, no shortness of breath and no vomiting       Past Medical History:  Diagnosis Date   Acute conjunctivitis of both eyes 02/26/2020   Acute non-recurrent frontal sinusitis 03/08/2020   Acute upper respiratory infection 12/01/2009   Qualifier: Diagnosis of  By: Elease Hashimoto MD, Bruce     Allergy    seasonal    Ankle sprain 05/18/2011   right  grade 2?  needs help with  fu  has bilateral extremity injuries soft tissue presumed    Anxiety    ARTHRALGIA 11/10/2009   Qualifier: Diagnosis of  By: Elease Hashimoto MD, Bruce     Cancer [facility]) 2009   squamous  cell skin   Cataract    bilateral surgery, left unsuccessful   Chest wall soft tissue injury 05/18/2011   right  org neg for rib fracture     Cystitis 02/12/2020   Depression    DYSPNEA 11/10/2009   Qualifier: Diagnosis of  By: Elease Hashimoto MD, Bruce     Eating disorder    Encounter for laboratory test 02/12/2020   FOOT PAIN, BILATERAL 09/07/2008   Qualifier: Diagnosis of  By: Elease Hashimoto MD, Bruce     GERD (gastroesophageal reflux disease)    Glaucoma    Head injury 07/23/2018   Hiatal hernia    History of cardiovascular stress test 04/2000   cardiolyte   History of flexible sigmoidoscopy 09/21/1999   INGROWN NAIL 05/19/2009   Qualifier: Diagnosis of  By: Sarajane Jews MD, Annie Main A    Non-recurrent acute suppurative otitis media of both ears without spontaneous rupture of tympanic membranes 02/12/2020   Other fatigue 02/26/2020   Otitis externa 03/08/2020   Redness of both eyes 03/08/2020   Right knee injury 08/14/2019   Sprain of foot, left 05/18/2011   WEIGHT GAIN 11/10/2009   Qualifier: Diagnosis of  By: Elease Hashimoto MD, Bruce      Patient Active Problem List   Diagnosis Date Noted   Forgetfulness 12/16/2020   Brain fog 12/16/2020   Generalized anxiety disorder 12/16/2020   Hypertension 11/23/2020   Cough 11/23/2020   Leg edema 11/23/2020   Otitis of both ears 03/08/2020   Fatigue 02/26/2020   Lumbar spondylosis  09/25/2019   Primary osteoarthritis of both knees 08/14/2019   Hepatic steatosis 10/23/2016   Depression, recurrent ([place]) 09/07/2008    Past Surgical History:  Procedure Laterality Date   ABDOMINAL HYSTERECTOMY     APPENDECTOMY     CARDIAC CATHETERIZATION  04/2000   CARDIAC CATHETERIZATION     COLONOSCOPY  12/31/09   UPPER GASTROINTESTINAL ENDOSCOPY  09/15/1999   WISDOM TOOTH EXTRACTION       OB History     Gravida  2   Para  2   Term      Preterm      AB      Living         SAB      IAB      Ectopic      Multiple      Live Births              Family  History  Problem Relation Age of Onset   Arthritis Other    Cancer Other        lung   Coronary artery disease Other    Stroke Other    Colon cancer Neg Hx    Colon polyps Neg Hx    Esophageal cancer Neg Hx    Rectal cancer Neg Hx    Stomach cancer Neg Hx     Social History   Tobacco Use   Smoking status: Former    Packs/day: 1.00    Years: 10.00    Pack years: 10.00    Types: Cigarettes    Quit date: 05/27/2008    Years since quitting: 12.8   Smokeless tobacco: Never  Vaping Use   Vaping Use: Never used  Substance Use Topics   Alcohol use: Yes    Comment: occ   Drug use: No    Home Medications Prior to Admission medications   Medication Sig Start Date End Date Taking? Authorizing Provider  ALPRAZolam Duanne Moron) 1 MG tablet Take 1 mg by mouth 3 (three) times daily as needed. For anxiety.   Yes [provider]  azelastine (OPTIVAR) 0.05 % ophthalmic solution Place 2 drops into both eyes 2 (two) times daily. 02/26/20  Yes Early, Coralee Pesa, NP  buPROPion (WELLBUTRIN XL) 150 MG 24 hr tablet Take 2 tablets (300 mg total) by mouth every morning. 01/06/21  Yes Early, Coralee Pesa, NP  chlorthalidone (HYGROTON) 25 MG tablet Take 1 tablet (25 mg total) by mouth daily. 12/16/20  Yes Early, Coralee Pesa, NP  desvenlafaxine (PRISTIQ) 100 MG 24 hr tablet Take 1 tablet (100 mg total) by mouth every morning. 01/06/21  Yes Early, Coralee Pesa, NP  traZODone (DESYREL) 100 MG tablet Take 300 mg by mouth at bedtime.   Yes [provider]  Blood Pressure Monitor MISC Use as directed 11/23/20   Early, Coralee Pesa, NP  D3-50 50000 units capsule Take 50,000 Units by mouth once a week. 02/02/18   [provider]  Fluticasone-Umeclidin-Vilant (TRELEGY ELLIPTA) 200-62.5-25 MCG/INH AEPB Inhale 1 puff into the lungs daily. 12/13/20   Orma Render, NP  triamcinolone cream (KENALOG) 0.1 % APPLY AS DIRECTED TO AFFECTED AREA TWICE A DAY FOR TWO WEEKS THEN AS NEEDED FOR FLARES 07/29/20   [provider]     Allergies    Doxycycline, Penicillins, and Prednisone  Review of Systems   Review of Systems  Constitutional:  Negative for chills and fever.  HENT:  Negative for facial swelling and trouble swallowing.   Eyes:  Negative for photophobia and visual disturbance.  Respiratory:  Negative for cough and shortness of breath.   Cardiovascular:  Positive for chest pain. Negative for palpitations.  Gastrointestinal:  Negative for abdominal pain, nausea and vomiting.  Endocrine: Negative for polydipsia and polyuria.  Genitourinary:  Negative for difficulty urinating and hematuria.  Musculoskeletal:  Negative for gait problem and joint swelling.  Skin:  Negative for pallor and rash.  Neurological:  Negative for syncope and headaches.  Psychiatric/Behavioral:  Negative for agitation and confusion. The patient is nervous/anxious.    Physical Exam Updated Vital Signs BP 123/80   Pulse 62   Temp 98 F (36.7 C)   Resp 17   Ht 5' 6.5" (1.689 m)   Wt 63.5 kg   SpO2 96%   BMI 22.26 kg/m   Physical Exam Vitals and nursing note reviewed.  Constitutional:      General: She is not in acute distress.    Appearance: Normal appearance. She is well-developed.  HENT:     Head: Normocephalic and atraumatic.     Right Ear: External ear normal.     Left Ear: External ear normal.     Nose: Nose normal.     Mouth/Throat:     Mouth: Mucous membranes are moist.  Eyes:     General: No scleral icterus.       Right eye: No discharge.        Left eye: No discharge.  Cardiovascular:     Rate and Rhythm: Normal rate and regular rhythm.     Pulses: Normal pulses.          Radial pulses are 2+ on the right side and 2+ on the left side.       Posterior tibial pulses are 2+ on the right side and 2+ on the left side.     Heart sounds: Normal heart sounds. No murmur heard. Pulmonary:     Effort: Pulmonary effort is normal. No respiratory distress.     Breath sounds: Normal breath sounds.  Abdominal:      General: Abdomen is flat.     Tenderness: There is no abdominal tenderness.  Musculoskeletal:        General: Normal range of motion.     Cervical back: Normal range of motion. No rigidity.     Right lower leg: No edema.     Left lower leg: No edema.  Skin:    General: Skin is warm and dry.     Capillary Refill: Capillary refill takes less than 2 seconds.  Neurological:     Mental Status: She is alert.  Psychiatric:        Mood and Affect: Mood is anxious.        Behavior: Behavior normal.    ED Results / Procedures / Treatments   Labs (all labs ordered are listed, but only abnormal results are displayed) Labs Reviewed  BASIC METABOLIC PANEL - Abnormal; Notable for the following components:      Result Value   Potassium 3.4 (*)    All other components within normal limits  CBC  TROPONIN I (HIGH SENSITIVITY)  TROPONIN I (HIGH SENSITIVITY)    EKG EKG Interpretation  Date/Time:  Tuesday March 15 2021 22:02:30 EDT Ventricular Rate:  61 PR Interval:  160 QRS Duration: 95 QT Interval:  428 QTC Calculation: 432 R Axis:   -38 Text Interpretation: Sinus rhythm Left axis deviation Confirmed by Wynona Dove ([id number]) on 03/16/2021 12:50:11 AM  Radiology DG Chest  2 View  Result Date: 03/15/2021 CLINICAL DATA:  70 year old female with history of chest pain. EXAM: CHEST - 2 VIEW COMPARISON:  Chest x-ray 02/08/2021. FINDINGS: Lung volumes are normal. No consolidative airspace disease. No pleural effusions. No pneumothorax. No pulmonary nodule or mass noted. Pulmonary vasculature and the cardiomediastinal silhouette are within normal limits. IMPRESSION: No radiographic evidence of acute cardiopulmonary disease. Electronically Signed   By: Vinnie Langton M.D.   On: 03/15/2021 19:39    Procedures Procedures   Medications Ordered in ED Medications - No data to display  ED Course  I have reviewed the triage vital signs and the nursing notes.  Pertinent labs & imaging results  that were available during my care of the patient were reviewed by me and considered in my medical decision making (see chart for details).    MDM Rules/Calculators/A&P                          This patient complains of chest pain; this involves an extensive number of treatment options and is a complaint that carries with it a high risk of complications and Morbidity.  Vital signs reviewed and are stable serious etiologies considered.   I ordered, reviewed and interpreted labs  I ordered imaging studies which included chest x-ray and I independently visualized and interpreted imaging which showed no acute process  Additional history obtained from spouse  Previous records obtained and reviewed   Work note provided, advised patient to rest the next couple days to reduce her stress a/w work. She does not want any anxiolytic medication. She will f/u with PCP if she wants this in the future.   D/w patient and spouse at length, all questions answered, stable for discharge.   The patient's chest pain is not suggestive of pulmonary embolus, cardiac ischemia, aortic dissection, pericarditis, myocarditis, pulmonary embolism, pneumothorax, pneumonia, Zoster, or esophageal perforation, or other serious etiology.  Historically not abrupt in onset, tearing or ripping, pulses symmetric. EKG nonspecific for ischemia/infarction. No dysrhythmias, brugada, WPW, prolonged QT noted. Troponin negative. CXR reviewed. Labs without demonstration of acute pathology unless otherwise noted above. Low HEART Score: 0-3 points (0.9-1.7% risk of MACE). Given the extremely low risk of these diagnoses further testing and evaluation for these possibilities does not appear to be indicated at this time. Patient in no distress and overall condition improved here in the ED. Detailed discussions were had with the patient regarding current findings, and need for close f/u with PCP or on call doctor. The patient has been instructed to  return immediately if the symptoms worsen in any way for re-evaluation. Patient verbalized understanding and is in agreement with current care plan. All questions answered prior to discharge.  Final Clinical Impression(s) / ED Diagnoses Final diagnoses:  Atypical chest pain  Stress at work    Rx / DC Orders ED Discharge Orders     None        Jeanell Sparrow, DO 03/16/21 [phone]

## 2021-03-15 NOTE — Discharge Instructions (Addendum)

## 2021-03-15 NOTE — ED Triage Notes (Signed)
Intermittent chest pain radiating to her left arm before lunch.  Denies n/v or dizziness.

## 2021-03-16 ENCOUNTER — Other Ambulatory Visit (HOSPITAL_BASED_OUTPATIENT_CLINIC_OR_DEPARTMENT_OTHER): Payer: Self-pay

## 2021-03-18 ENCOUNTER — Ambulatory Visit (HOSPITAL_BASED_OUTPATIENT_CLINIC_OR_DEPARTMENT_OTHER): Payer: PPO | Admitting: Nurse Practitioner

## 2021-03-22 ENCOUNTER — Ambulatory Visit (HOSPITAL_BASED_OUTPATIENT_CLINIC_OR_DEPARTMENT_OTHER): Payer: PPO | Admitting: Nurse Practitioner

## 2021-03-22 ENCOUNTER — Encounter (HOSPITAL_BASED_OUTPATIENT_CLINIC_OR_DEPARTMENT_OTHER): Payer: Self-pay

## 2021-03-28 ENCOUNTER — Ambulatory Visit (INDEPENDENT_AMBULATORY_CARE_PROVIDER_SITE_OTHER): Payer: PPO | Admitting: Nurse Practitioner

## 2021-03-28 ENCOUNTER — Other Ambulatory Visit: Payer: Self-pay

## 2021-03-28 ENCOUNTER — Encounter (HOSPITAL_BASED_OUTPATIENT_CLINIC_OR_DEPARTMENT_OTHER): Payer: Self-pay | Admitting: Nurse Practitioner

## 2021-03-28 VITALS — BP 122/82 | HR 72 | Resp 12 | Wt 240.0 lb

## 2021-03-28 DIAGNOSIS — M79605 Pain in left leg: Secondary | ICD-10-CM | POA: Insufficient documentation

## 2021-03-28 DIAGNOSIS — I10 Essential (primary) hypertension: Secondary | ICD-10-CM | POA: Diagnosis not present

## 2021-03-28 DIAGNOSIS — R5383 Other fatigue: Secondary | ICD-10-CM

## 2021-03-28 DIAGNOSIS — F411 Generalized anxiety disorder: Secondary | ICD-10-CM | POA: Diagnosis not present

## 2021-03-28 HISTORY — DX: Pain in left leg: M79.605

## 2021-03-28 NOTE — Assessment & Plan Note (Signed)
Significant improvement of anxiety symptoms with improved work-life balance.  She is sleeping better, which I feel is helping tremendously.  I recommend to continue to monitor symptoms and continue current treatments.  Avoid working excessive hours and rest well at night.

## 2021-03-28 NOTE — Assessment & Plan Note (Signed)
Increased symptom control since starting to take Aleve at bedtime.  Her mood and memory have also improved.  Possible pain creating disturbance in sleep which exacerbated her symptoms.  I am pleased that she has found better control.  Continue Aleve at bedtime for pain.  Notify if symptoms exacerbate again.

## 2021-03-28 NOTE — Assessment & Plan Note (Signed)
Erythema and warmth present over the left anterior calf with concerns for venous thrombosis of superficial vein.  Patient has been sitting more than typical, which is also concerning.  Pain in the popliteal fossa also present with no changes to the skin and no palpable mass. Concerns for possible bakers cyst vs. Thrombosis. Will send for vascular US to [facility] care [place] for further evaluation Recommend compression wrap to knee to help with pain and ice and heat 20 minutes each to both popliteal fossa and anterior calf.  Avoid excess walking and keep legs elevated. Stay well hydrated. May take Aleve at bedtime for pain.  Would recommend 81mg  ASA in the morning for now.  Will make changes to plan of care when results available.

## 2021-03-28 NOTE — Progress Notes (Signed)
Established Patient Office Visit  Subjective:  Patient ID: Emily [name], female    DOB: 07-25-1950  Age: 70 y.o. MRN: [id number]  CC:  Chief Complaint  Patient presents with   Follow-up    Mood, fatigue, HTN   Leg Pain    Popliteal fossa pain of left leg that started in the last 2 days. Red, warm, tender nodule on anterior calf that started around the same time. Seems to be getting bigger. Swelling also present.      HPI Emily [name] presents for follow-up for follow-up and to discuss left leg pain.   Emily [name] reports she has been doing very well emotionally and with her fatigue lately. She did have an episode a few weeks ago with chest pain that prompted and ED visit. That was found to be non-cardiac related. She reports since that time she has been feeling much better.  She reports she is not working as much as she has been and she is sleeping better.  She has been taking Aleve at bedtime for hip pain and she reports that she wakes feeling good and rested. Her husband, Mikki Santee, tells me that she is sleeping more soundly and not as "noisy" when she sleeps, as well. He reports that he has seen a marked difference in her mood and mentation since sleeping more. He has also been fixing her lunch for her to help her ensure that she is eating mid-day- she had been skipping lunch on many days.   Today she tells me that she noted pain behind her left knee in the last 2 days. She has had some swelling in the area, but no redness or streaking. She also noticed redness and warmth to an area on her anterior calf midway down the leg. She reports the area of darkness has grown and she can feel a knot under the skin.  She has no known injury and has not hit her leg in either place. She thinks she may have turned wrong to hurt her knee, but is not sure what has happened with her calf.  She is not having shortness of breath, CP, or dizziness.    Past Medical History:  Diagnosis Date    Allergy    seasonal    Anxiety    ARTHRALGIA 11/10/2009   Qualifier: Diagnosis of  By: Elease Hashimoto MD, Bruce     Cancer [facility]) 2009   squamous cell skin   Cataract    bilateral surgery, left unsuccessful   Cystitis 02/12/2020   Depression    DYSPNEA 11/10/2009   Qualifier: Diagnosis of  By: Elease Hashimoto MD, Bruce     Eating disorder    GERD (gastroesophageal reflux disease)    Glaucoma    Head injury 07/23/2018   Hiatal hernia    History of cardiovascular stress test 04/2000   cardiolyte   History of flexible sigmoidoscopy 09/21/1999   Other fatigue 02/26/2020   WEIGHT GAIN 11/10/2009   Qualifier: Diagnosis of  By: Elease Hashimoto MD, Bruce      Past Surgical History:  Procedure Laterality Date   ABDOMINAL HYSTERECTOMY     APPENDECTOMY     CARDIAC CATHETERIZATION  04/2000   CARDIAC CATHETERIZATION     COLONOSCOPY  12/31/09   UPPER GASTROINTESTINAL ENDOSCOPY  09/15/1999   WISDOM TOOTH EXTRACTION      Family History  Problem Relation Age of Onset   Arthritis Other    Cancer Other        lung  Coronary artery disease Other    Stroke Other    Colon cancer Neg Hx    Colon polyps Neg Hx    Esophageal cancer Neg Hx    Rectal cancer Neg Hx    Stomach cancer Neg Hx     Social History   Socioeconomic History   Marital status: Married    Spouse name: Not on file   Number of children: Not on file   Years of education: Not on file   Highest education level: Not on file  Occupational History   Not on file  Tobacco Use   Smoking status: Former    Packs/day: 1.00    Years: 10.00    Pack years: 10.00    Types: Cigarettes    Quit date: 05/27/2008    Years since quitting: 12.8   Smokeless tobacco: Never  Vaping Use   Vaping Use: Never used  Substance and Sexual Activity   Alcohol use: Yes    Comment: occ   Drug use: No   Sexual activity: Not on file  Other Topics Concern   Not on file  Social History Narrative   Not on file   Social Determinants of Health    Financial Resource Strain: Not on file  Food Insecurity: Not on file  Transportation Needs: Not on file  Physical Activity: Not on file  Stress: Not on file  Social Connections: Not on file  Intimate Partner Violence: Not on file    Outpatient Medications Prior to Visit  Medication Sig Dispense Refill   ergocalciferol (VITAMIN D2) 1.25 MG (50000 UT) capsule Take 1 capsule by mouth once a week.     ALPRAZolam (XANAX) 1 MG tablet Take 1 mg by mouth 3 (three) times daily as needed. For anxiety.     azelastine (OPTIVAR) 0.05 % ophthalmic solution Place 2 drops into both eyes 2 (two) times daily. 6 mL 11   Blood Pressure Monitor MISC Use as directed 1 each 0   buPROPion (WELLBUTRIN XL) 150 MG 24 hr tablet Take 2 tablets (300 mg total) by mouth every morning. 60 tablet 3   chlorthalidone (HYGROTON) 25 MG tablet Take 1 tablet (25 mg total) by mouth daily. 30 tablet 12   desvenlafaxine (PRISTIQ) 100 MG 24 hr tablet Take 1 tablet (100 mg total) by mouth every morning. 30 tablet 12   Fluticasone-Umeclidin-Vilant (TRELEGY ELLIPTA) 200-62.5-25 MCG/INH AEPB Inhale 1 puff into the lungs daily. 28 each 11   ROCKLATAN 0.02-0.005 % SOLN Apply to eye.     traZODone (DESYREL) 100 MG tablet Take 300 mg by mouth at bedtime.     triamcinolone ointment (KENALOG) 0.1 % PLEASE SEE ATTACHED FOR DETAILED DIRECTIONS     D3-50 50000 units capsule Take 50,000 Units by mouth once a week.  12   triamcinolone cream (KENALOG) 0.1 % APPLY AS DIRECTED TO AFFECTED AREA TWICE A DAY FOR TWO WEEKS THEN AS NEEDED FOR FLARES     No facility-administered medications prior to visit.    Allergies  Allergen Reactions   Doxycycline     Gi upset   Penicillins     REACTION: rash, aggression Has patient had a PCN reaction causing immediate rash, facial/tongue/throat swelling, SOB or lightheadedness with hypotension: YES Has patient had a PCN reaction causing severe rash involving mucus membranes or skin necrosis: NO Has  patient had a PCN reaction that required hospitalizationNO Has patient had a PCN reaction occurring within the last 10 years:NO If all of the above answers  are "NO", then may proceed with Cephalosporin use.    Prednisone     REACTION: agressive behavior    ROS Review of Systems All review of systems negative except what is listed in the HPI    Objective:    Physical Exam Vitals and nursing note reviewed.  Constitutional:      General: She is not in acute distress.    Appearance: Normal appearance.  Eyes:     Extraocular Movements: Extraocular movements intact.     Conjunctiva/sclera: Conjunctivae normal.     Pupils: Pupils are equal, round, and reactive to light.  Neck:     Vascular: No carotid bruit.  Cardiovascular:     Rate and Rhythm: Normal rate and regular rhythm.     Pulses:          Dorsalis pedis pulses are detected w/ Doppler on the right side and detected w/ Doppler on the left side.       Posterior tibial pulses are detected w/ Doppler on the right side and detected w/ Doppler on the left side.     Heart sounds: Normal heart sounds. No murmur heard. Pulmonary:     Effort: Pulmonary effort is normal. No respiratory distress.     Breath sounds: Normal breath sounds. No wheezing.  Abdominal:     General: Bowel sounds are normal.     Palpations: Abdomen is soft.  Musculoskeletal:        General: Swelling and tenderness present. Normal range of motion.     Left lower leg: Edema present.  Feet:     Right foot:     Skin integrity: Skin integrity normal.  Skin:    General: Skin is warm and dry.     Capillary Refill: Capillary refill takes less than 2 seconds.     Findings: Erythema present.       Neurological:     General: No focal deficit present.     Mental Status: She is alert and oriented to person, place, and time.     Sensory: No sensory deficit.     Motor: No weakness.     Coordination: Coordination normal.     Gait: Gait abnormal.  Psychiatric:         Mood and Affect: Mood normal.        Behavior: Behavior normal.        Thought Content: Thought content normal.        Judgment: Judgment normal.    BP 122/82 (BP Location: Left Arm, Cuff Size: Large)   Pulse 72   Resp 12   Wt 240 lb (108.9 kg)   SpO2 100%   BMI 38.16 kg/m  Wt Readings from Last 3 Encounters:  03/28/21 240 lb (108.9 kg)  03/15/21 140 lb (63.5 kg)  02/03/21 242 lb (109.8 kg)     Health Maintenance Due  Topic Date Due   Hepatitis C Screening  Never done   Zoster Vaccines- Shingrix (1 of 2) Never done   DEXA SCAN  Never done   COVID-19 Vaccine (4 - Booster for Pfizer series) 08/27/2020   INFLUENZA VACCINE  01/17/2021    There are no preventive care reminders to display for this patient.  Lab Results  Component Value Date   TSH 2.19 02/03/2021   Lab Results  Component Value Date   WBC 6.0 03/15/2021   HGB 13.7 03/15/2021   HCT 39.5 03/15/2021   MCV 92.5 03/15/2021   PLT 264 03/15/2021   Lab Results  Component Value Date   NA 139 03/15/2021   K 3.4 (L) 03/15/2021   CO2 28 03/15/2021   GLUCOSE 96 03/15/2021   BUN 18 03/15/2021   CREATININE 0.81 03/15/2021   BILITOT 0.3 11/23/2020   ALKPHOS 69 11/23/2020   AST 17 11/23/2020   ALT 28 11/23/2020   PROT 6.4 11/23/2020   ALBUMIN 4.2 11/23/2020   CALCIUM 9.5 03/15/2021   ANIONGAP 10 03/15/2021   EGFR 73 11/23/2020   GFR 64.64 11/14/2018   Lab Results  Component Value Date   CHOL 170 02/12/2020   Lab Results  Component Value Date   HDL 56 02/12/2020   Lab Results  Component Value Date   LDLCALC 93 02/12/2020   Lab Results  Component Value Date   TRIG 116 02/12/2020   Lab Results  Component Value Date   CHOLHDL 3.0 02/12/2020   Lab Results  Component Value Date   HGBA1C 5.3 02/12/2020      Assessment & Plan:   Problem List Items Addressed This Visit     Fatigue    Increased symptom control since starting to take Aleve at bedtime.  Her mood and memory have also  improved.  Possible pain creating disturbance in sleep which exacerbated her symptoms.  I am pleased that she has found better control.  Continue Aleve at bedtime for pain.  Notify if symptoms exacerbate again.       Hypertension    BP very well controlled today.  She also reports improved control of pressures in her eyes.  No changes in medications at this time.  We will continue to monitor.       Generalized anxiety disorder    Significant improvement of anxiety symptoms with improved work-life balance.  She is sleeping better, which I feel is helping tremendously.  I recommend to continue to monitor symptoms and continue current treatments.  Avoid working excessive hours and rest well at night.        Pain of left lower extremity - Primary    Erythema and warmth present over the left anterior calf with concerns for venous thrombosis of superficial vein.  Patient has been sitting more than typical, which is also concerning.  Pain in the popliteal fossa also present with no changes to the skin and no palpable mass. Concerns for possible bakers cyst vs. Thrombosis. Will send for vascular US to heart care on [place] for further evaluation Recommend compression wrap to knee to help with pain and ice and heat 20 minutes each to both popliteal fossa and anterior calf.  Avoid excess walking and keep legs elevated. Stay well hydrated. May take Aleve at bedtime for pain.  Would recommend $RemoveBeforeD'81mg'[id number]$  ASA in the morning for now.  Will make changes to plan of care when results available.       Relevant Orders   VAS [place] LOWER EXTREMITY VENOUS (DVT)    No orders of the defined types were placed in this encounter.   Follow-up: Return if symptoms worsen or fail to improve.   Time: 35 minutes, >50% spent counseling, care coordination, chart review, and documentation.   Orma Render, NP

## 2021-03-28 NOTE — Patient Instructions (Addendum)
Recommendations from today's visit: I am concerned you have a clot in your leg. I will let you know what further recommendations I have when we get the results back of the ultrasound.  We have ordered this to be done at [facility] on [facility].    It appears you have a bakers cyst behind your knee. I recommend continue to use the Aleve. I recommend wrapping your knee while awake.  If this is not better with rest and the wrap, along with ice- let me know. We will have them look when we do the ultrasound to make sure there is nothing vascular affecting the lower leg.   I am so glad you are feeling better!!

## 2021-03-28 NOTE — Assessment & Plan Note (Signed)
BP very well controlled today.  She also reports improved control of pressures in her eyes.  No changes in medications at this time.  We will continue to monitor.

## 2021-03-29 ENCOUNTER — Ambulatory Visit (HOSPITAL_COMMUNITY)
Admission: RE | Admit: 2021-03-29 | Discharge: 2021-03-29 | Disposition: A | Discharge status: Home / Self Care | Payer: PPO | Source: Ambulatory Visit | Attending: Cardiology | Admitting: Cardiology

## 2021-03-29 DIAGNOSIS — M79605 Pain in left leg: Secondary | ICD-10-CM | POA: Insufficient documentation

## 2021-03-30 NOTE — Progress Notes (Signed)
Please call Emily [name] and let her know that her ultrasound does not show any blood clot. My suspicion is that she hit her leg and this formed a knot at the site. No concerns for clots behind the knee, either. She can keep using the wrap on her knee to help with pain and stability until it starts feeling better. Walking daily will also help.

## 2021-03-31 ENCOUNTER — Telehealth (HOSPITAL_BASED_OUTPATIENT_CLINIC_OR_DEPARTMENT_OTHER): Payer: Self-pay

## 2021-03-31 NOTE — Telephone Encounter (Signed)
-----   Message from Orma Render, NP sent at 03/30/2021  2:56 PM EDT ----- Please call Emily [name] and let her know that her ultrasound does not show any blood clot. My suspicion is that she hit her leg and this formed a knot at the site. No concerns for clots behind the knee, either. She can keep using the wrap on her knee to help with pain and stability until it starts feeling better. Walking daily will also help.

## 2021-03-31 NOTE — Telephone Encounter (Signed)
Called patient to results. Patient is aware and agreeable to results and recommendations. Instructed patient to contact the office with questions or concerns.

## 2021-04-05 ENCOUNTER — Encounter (HOSPITAL_BASED_OUTPATIENT_CLINIC_OR_DEPARTMENT_OTHER): Payer: Self-pay | Admitting: Nurse Practitioner

## 2021-04-05 ENCOUNTER — Other Ambulatory Visit: Payer: Self-pay

## 2021-04-05 ENCOUNTER — Ambulatory Visit (INDEPENDENT_AMBULATORY_CARE_PROVIDER_SITE_OTHER): Payer: PPO | Admitting: Nurse Practitioner

## 2021-04-05 VITALS — Ht 66.0 in | Wt 232.0 lb

## 2021-04-05 DIAGNOSIS — J329 Chronic sinusitis, unspecified: Secondary | ICD-10-CM | POA: Insufficient documentation

## 2021-04-05 DIAGNOSIS — J40 Bronchitis, not specified as acute or chronic: Secondary | ICD-10-CM

## 2021-04-05 HISTORY — DX: Chronic sinusitis, unspecified: J32.9

## 2021-04-05 MED ORDER — AZITHROMYCIN 250 MG PO TABS: ORAL_TABLET | ORAL | 0 refills | Status: DC

## 2021-04-05 MED ORDER — ALBUTEROL SULFATE HFA 108 (90 BASE) MCG/ACT IN AERS: 2.0000 | INHALATION_SPRAY | RESPIRATORY_TRACT | 1 refills | Status: DC | PRN

## 2021-04-05 MED ORDER — PREDNISONE 20 MG PO TABS: 40.0000 mg | ORAL_TABLET | Freq: Every day | ORAL | 0 refills | Status: DC

## 2021-04-05 NOTE — Patient Instructions (Signed)
I have sent in azithromycin, prednisone burst, and albuterol inhaler for you to the pharmacy. You may continue to use the Mucinex and over-the-counter antihistamine that you have been using. I want you to rest is much as possible and make sure you are drinking plenty of fluids. If the 2 tablets of prednisone are too much you may drop down to 1 tablet in the morning if you tolerate this any better. The albuterol inhaler will not increase her intraocular pressures that this would be safe to use for shortness of breath that is intermediate. Please let me know if you or are not feeling any better by Thursday afternoon.

## 2021-04-05 NOTE — Progress Notes (Signed)
Virtual Visit Encounter telephone visit.   I connected with  Emily [name] on 04/05/21 at  2:10 PM EDT by secure audio and/or video enabled telemedicine application. I verified that I am speaking with the correct person using two identifiers.   I introduced myself as a Designer, jewellery with the practice. The limitations of evaluation and management by telemedicine discussed with the patient and the availability of in person appointments. The patient expressed verbal understanding and consent to proceed.  Participating parties in this visit include: Myself and patient and spouse  The patient is: Patient Location: Home I am: Provider Location: Office/Clinic Subjective:    CC and HPI: Emily [name] is a 70 y.o. year old female presenting for new evaluation and treatment of cough, fever chills body aches congestion ear fullness decreased appetite green mucus, and loss of voice. Symptoms all began last Wednesday.  Patient has been unable to work and in bed since that time. Symptoms have not improved since onset. She has been taking Mucinex, Claritin, and increased fluids. She has been resting.  She does have some shortness of breath with exertion.  She and her husband have the same symptoms.  They have both been vaccinated against COVID. They have both had flu vaccines.   Past medical history, Surgical history, Family history not pertinant except as noted below, Social history, Allergies, and medications have been entered into the medical record, reviewed, and corrections made.   Review of Systems:  All review of systems negative except what is listed in the HPI  Objective:    Alert and oriented x 4 Speaking in clear sentences with no shortness of breath. No distress.  Impression and Recommendations:    Problem List Items Addressed This Visit     Sinobronchitis - Primary    Symptoms and presentation consistent with sinobronchitis. Given the length of time symptoms  have been present and the lack of improvement we will begin treatment today with azithromycin, prednisone, and albuterol inhaler. Discussed with patient's husband that albuterol Hailer will not increase her intraocular pressure. Patient does have symptoms of aggression with high long-term doses of prednisone however she does tolerate short bursts fine.  Given the history of her breathing issues and symptoms now I do feel that this would be of benefit to her. She may continue to take the Mucinex and over-the-counter antihistamine as well as increase fluids and rest. Recommend if symptoms worsen, she begins to have shortness of breath at rest, or if at any point she becomes concerned about her symptoms to seek emergency care or contact me immediately.       Relevant Medications   albuterol (VENTOLIN HFA) 108 (90 Base) MCG/ACT inhaler   azithromycin (ZITHROMAX) 250 MG tablet   predniSONE (DELTASONE) 20 MG tablet    orders and follow up as documented in EMR I discussed the assessment and treatment plan with the patient. The patient was provided an opportunity to ask questions and all were answered. The patient agreed with the plan and demonstrated an understanding of the instructions.   The patient was advised to call back or seek an in-person evaluation if the symptoms worsen or if the condition fails to improve as anticipated.  Follow-Up: prn  I provided 10 minutes of non-face-to-face interaction with this non face-to-face encounter including intake, same-day documentation, and chart review.   Orma Render, NP , DNP, AGNP-c [place] [facility] [phone] (fax)

## 2021-04-05 NOTE — Assessment & Plan Note (Addendum)
Symptoms and presentation consistent with sinobronchitis. Given the length of time symptoms have been present and the lack of improvement we will begin treatment today with azithromycin, prednisone, and albuterol inhaler. She does have an allergy to penicillins.  Historically she has had to take 2 rounds of azithromycin when she has had issues such as this therefore we will go ahead and begin longer treatment at this time to hopefully avoid needing 2 rounds. Discussed with patient's husband that albuterol Hailer will not increase her intraocular pressure. Patient does have symptoms of aggression with high long-term doses of prednisone however she does tolerate short bursts fine.  Given the history of her breathing issues and symptoms now I do feel that this would be of benefit to her. She may continue to take the Mucinex and over-the-counter antihistamine as well as increase fluids and rest. Recommend if symptoms worsen, she begins to have shortness of breath at rest, or if at any point she becomes concerned about her symptoms to seek emergency care or contact me immediately.

## 2021-04-07 ENCOUNTER — Encounter (HOSPITAL_BASED_OUTPATIENT_CLINIC_OR_DEPARTMENT_OTHER): Payer: Self-pay | Admitting: Nurse Practitioner

## 2021-04-07 DIAGNOSIS — J329 Chronic sinusitis, unspecified: Secondary | ICD-10-CM

## 2021-04-12 ENCOUNTER — Other Ambulatory Visit: Payer: Self-pay

## 2021-04-12 ENCOUNTER — Ambulatory Visit: Payer: PPO

## 2021-04-12 DIAGNOSIS — R5382 Chronic fatigue, unspecified: Secondary | ICD-10-CM

## 2021-04-12 DIAGNOSIS — G4733 Obstructive sleep apnea (adult) (pediatric): Secondary | ICD-10-CM | POA: Diagnosis not present

## 2021-04-12 MED ORDER — AZITHROMYCIN 250 MG PO TABS: ORAL_TABLET | ORAL | 0 refills | Status: AC

## 2021-04-14 ENCOUNTER — Other Ambulatory Visit: Payer: Self-pay | Admitting: Family Medicine

## 2021-04-14 ENCOUNTER — Other Ambulatory Visit: Payer: Self-pay | Admitting: Nurse Practitioner

## 2021-04-14 DIAGNOSIS — Z1231 Encounter for screening mammogram for malignant neoplasm of breast: Secondary | ICD-10-CM

## 2021-04-15 DIAGNOSIS — G4733 Obstructive sleep apnea (adult) (pediatric): Secondary | ICD-10-CM | POA: Diagnosis not present

## 2021-04-26 ENCOUNTER — Encounter (HOSPITAL_BASED_OUTPATIENT_CLINIC_OR_DEPARTMENT_OTHER): Payer: Self-pay

## 2021-04-26 ENCOUNTER — Other Ambulatory Visit: Payer: Self-pay

## 2021-04-26 ENCOUNTER — Encounter: Payer: Self-pay | Admitting: Student

## 2021-04-26 ENCOUNTER — Ambulatory Visit: Payer: PPO | Admitting: Student

## 2021-04-26 ENCOUNTER — Ambulatory Visit (INDEPENDENT_AMBULATORY_CARE_PROVIDER_SITE_OTHER): Payer: PPO | Admitting: Student

## 2021-04-26 VITALS — BP 128/80 | HR 112 | Temp 98.3°F | Ht 67.0 in | Wt 240.0 lb

## 2021-04-26 DIAGNOSIS — J310 Chronic rhinitis: Secondary | ICD-10-CM

## 2021-04-26 DIAGNOSIS — R0609 Other forms of dyspnea: Secondary | ICD-10-CM | POA: Diagnosis not present

## 2021-04-26 DIAGNOSIS — R052 Subacute cough: Secondary | ICD-10-CM

## 2021-04-26 DIAGNOSIS — J329 Chronic sinusitis, unspecified: Secondary | ICD-10-CM

## 2021-04-26 DIAGNOSIS — G4733 Obstructive sleep apnea (adult) (pediatric): Secondary | ICD-10-CM

## 2021-04-26 LAB — PULMONARY FUNCTION TEST
DL/VA % pred: 114 %
DL/VA: 4.75 ml/min/mmHg/L
DLCO cor % pred: 107 %
DLCO cor: 20.08 ml/min/mmHg
DLCO unc % pred: 107 %
DLCO unc: 20.08 ml/min/mmHg
FEF 25-75 Post: 2.69 L/sec
FEF 25-75 Pre: 2.69 L/sec
FEF2575-%Change-Post: 0 %
FEF2575-%Pred-Post: 149 %
FEF2575-%Pred-Pre: 149 %
FEV1-%Change-Post: 0 %
FEV1-%Pred-Post: 102 %
FEV1-%Pred-Pre: 103 %
FEV1-Post: 2.19 L
FEV1-Pre: 2.21 L
FEV1FVC-%Change-Post: 5 %
FEV1FVC-%Pred-Pre: 112 %
FEV6-%Change-Post: -5 %
FEV6-%Pred-Post: 90 %
FEV6-%Pred-Pre: 96 %
FEV6-Post: 2.45 L
FEV6-Pre: 2.6 L
FEV6FVC-%Pred-Post: 104 %
FEV6FVC-%Pred-Pre: 104 %
FVC-%Change-Post: -5 %
FVC-%Pred-Post: 86 %
FVC-%Pred-Pre: 91 %
FVC-Post: 2.45 L
FVC-Pre: 2.6 L
Post FEV1/FVC ratio: 89 %
Post FEV6/FVC ratio: 100 %
Pre FEV1/FVC ratio: 85 %
Pre FEV6/FVC Ratio: 100 %
RV % pred: 76 %
RV: 1.66 L
TLC % pred: 85 %
TLC: 4.2 L

## 2021-04-26 NOTE — Patient Instructions (Addendum)
-   Consider CPAP, autoPAP for your mild-moderate obstructive sleep apnea. Could also consider mandibular advancement device -ENT referral placed today -pneumonia shot next visit

## 2021-04-26 NOTE — Progress Notes (Signed)
Full PFT performed today. °

## 2021-04-26 NOTE — Patient Instructions (Signed)
Full PFT performed today. °

## 2021-04-26 NOTE — Progress Notes (Signed)
Synopsis: Referred for chronic fatigue, cough, DOE by Orma Render, NP  Subjective:   PATIENT ID: Emily [name]: female DOB: 28-Jan-1951, MRN: [id number]  Chief Complaint  Patient presents with   Follow-up    Flu vaccine today PFT review    69yF with history of anxiety, seasonal allergies, GERD, HTN who is referred by PCP for chronic fatigue, cough, DOE.  Seen by PCP 7/21 for otitis, sinusitis, prescribed azithromycin and later cefdinir. Pt had endorsed sore throat, ear pain, sinonasal fullness and exhaustion at time.  Periodically she gets exhausted with chest tightness, fatigue and shortness of breath 'emotions are at the forefront, I hate going to work,' her husband says she takes things personally quite a bit, irritable. These build up over a period of time until she says she gets some rest. She is a Nurse, learning disability. It has been quite busy over the last couple of years. By rest she means being able to leave her work behind completely. Most recently it took being off for 2 weeks. She has started trelegy 6 weeks ago perhaps. Maybe is helping. She is on treatment for glaucoma and asks if any component of trelegy could elevated IOP. No orthopnea. Had some BLE swelling which has responded to chlorthalidone.   Has had a stress test but many years ago.   She takes medicine to sleep - trazodone every night for many years. Frequent awakenings during these episodes of exhaustion, ruminates on work. No PND. She snores but not loud. She doesn't necessarily feel sleepy during these episodes of exhaustion. She says she is prone to panic attacks when she's super tired. Takes a xanax to abort these episodes.   She did smoke in past for 10 years, quit in 2009. Smoked half a pack to 1 ppd. She never had asthma as a kid. She says she is only rarely exposed to embalming fluid.   There is a history of lung cancer not in first degree relatives on her mother's side.   Interval HPI: Seen  for sinusitis/otitis/bronchitis by PCP  and prescribed z pack and prednisone burst on 04/05/21. This is the only course she's had since last visit. She is doing a bit better after this. Mood is improved as well. Fatigue has improved some since last visit. Cough is no longer productive.   Not feeling excessively sleepy during the day.   HSAT AHI of 14.6  Otherwise pertinent review of systems is negative.  Past Medical History:  Diagnosis Date   Allergy    seasonal    Anxiety    ARTHRALGIA 11/10/2009   Qualifier: Diagnosis of  By: Elease Hashimoto MD, Bruce     Cancer [facility]) 2009   squamous cell skin   Cataract    bilateral surgery, left unsuccessful   Cystitis 02/12/2020   Depression    DYSPNEA 11/10/2009   Qualifier: Diagnosis of  By: Elease Hashimoto MD, Bruce     Eating disorder    GERD (gastroesophageal reflux disease)    Glaucoma    Head injury 07/23/2018   Hiatal hernia    History of cardiovascular stress test 04/2000   cardiolyte   History of flexible sigmoidoscopy 09/21/1999   Other fatigue 02/26/2020   WEIGHT GAIN 11/10/2009   Qualifier: Diagnosis of  By: Elease Hashimoto MD, Bruce       Family History  Problem Relation Age of Onset   Arthritis Other    Cancer Other        lung   Coronary artery  disease Other    Stroke Other    Colon cancer Neg Hx    Colon polyps Neg Hx    Esophageal cancer Neg Hx    Rectal cancer Neg Hx    Stomach cancer Neg Hx      Past Surgical History:  Procedure Laterality Date   ABDOMINAL HYSTERECTOMY     APPENDECTOMY     CARDIAC CATHETERIZATION  04/2000   CARDIAC CATHETERIZATION     COLONOSCOPY  12/31/09   UPPER GASTROINTESTINAL ENDOSCOPY  09/15/1999   WISDOM TOOTH EXTRACTION      Social History   Socioeconomic History   Marital status: Married    Spouse name: Not on file   Number of children: Not on file   Years of education: Not on file   Highest education level: Not on file  Occupational History   Not on file  Tobacco Use   Smoking  status: Former    Packs/day: 1.00    Years: 10.00    Pack years: 10.00    Types: Cigarettes    Quit date: 05/27/2008    Years since quitting: 12.9   Smokeless tobacco: Never  Vaping Use   Vaping Use: Never used  Substance and Sexual Activity   Alcohol use: Yes    Comment: occ   Drug use: No   Sexual activity: Not on file  Other Topics Concern   Not on file  Social History Narrative   Not on file   Social Determinants of Health   Financial Resource Strain: Not on file  Food Insecurity: Not on file  Transportation Needs: Not on file  Physical Activity: Not on file  Stress: Not on file  Social Connections: Not on file  Intimate Partner Violence: Not on file     Allergies  Allergen Reactions   Doxycycline     Gi upset   Penicillins     REACTION: rash, aggression Has patient had a PCN reaction causing immediate rash, facial/tongue/throat swelling, SOB or lightheadedness with hypotension: YES Has patient had a PCN reaction causing severe rash involving mucus membranes or skin necrosis: NO Has patient had a PCN reaction that required hospitalizationNO Has patient had a PCN reaction occurring within the last 10 years:NO If all of the above answers are "NO", then may proceed with Cephalosporin use.    Prednisone     REACTION: agressive behavior     Outpatient Medications Prior to Visit  Medication Sig Dispense Refill   albuterol (VENTOLIN HFA) 108 (90 Base) MCG/ACT inhaler Inhale 2 puffs into the lungs every 4 (four) hours as needed for wheezing. 8 g 1   ALPRAZolam (XANAX) 1 MG tablet Take 1 mg by mouth 3 (three) times daily as needed. For anxiety.     azelastine (OPTIVAR) 0.05 % ophthalmic solution Place 2 drops into both eyes 2 (two) times daily. 6 mL 11   Blood Pressure Monitor MISC Use as directed 1 each 0   buPROPion (WELLBUTRIN XL) 150 MG 24 hr tablet Take 2 tablets (300 mg total) by mouth every morning. 60 tablet 3   chlorthalidone (HYGROTON) 25 MG tablet Take 1  tablet (25 mg total) by mouth daily. 30 tablet 12   desvenlafaxine (PRISTIQ) 100 MG 24 hr tablet Take 1 tablet (100 mg total) by mouth every morning. 30 tablet 12   ergocalciferol (VITAMIN D2) 1.25 MG (50000 UT) capsule Take 1 capsule by mouth once a week.     predniSONE (DELTASONE) 20 MG tablet Take 2 tablets (40  mg total) by mouth daily with breakfast. For 5 days 10 tablet 0   ROCKLATAN 0.02-0.005 % SOLN Apply to eye.     traZODone (DESYREL) 100 MG tablet Take 300 mg by mouth at bedtime.     triamcinolone ointment (KENALOG) 0.1 % PLEASE SEE ATTACHED FOR DETAILED DIRECTIONS     Fluticasone-Umeclidin-Vilant (TRELEGY ELLIPTA) 200-62.5-25 MCG/INH AEPB Inhale 1 puff into the lungs daily. (Patient not taking: Reported on 04/26/2021) 28 each 11   No facility-administered medications prior to visit.       Objective:   Physical Exam:  General appearance: 70 y.o., female, NAD, conversant  Eyes: anicteric sclerae, moist conjunctivae; no lid-lag; PERRL, tracking appropriately HENT: NCAT; oropharynx, MMM, no mucosal ulcerations; normal hard and soft palate. TM clear. Nasal mucosa is boggy, edematous but no purulent mucus.  Neck: Trachea midline; no lymphadenopathy, no JVD Lungs: CTAB, no crackles, no wheeze, with normal respiratory effort CV: RRR, no MRGs  Abdomen: Soft, non-tender; non-distended, BS present  Extremities: No peripheral edema, radial and DP pulses present bilaterally. +BLE non-pitting edema. Skin: Normal temperature, turgor and texture; no rash Psych: Appropriate affect Neuro: Alert and oriented to person and place, no focal deficit    Vitals:   04/26/21 1511  BP: 128/80  Pulse: (!) 112  Temp: 98.3 F (36.8 C)  TempSrc: Oral  SpO2: 97%  Weight: 240 lb (108.9 kg)  Height: 5\' 7"  (1.702 m)    97% on RA BMI Readings from Last 3 Encounters:  04/26/21 37.59 kg/m  04/05/21 37.45 kg/m  03/28/21 38.16 kg/m   Wt Readings from Last 3 Encounters:  04/26/21 240 lb (108.9  kg)  04/05/21 232 lb (105.2 kg)  03/28/21 240 lb (108.9 kg)     CBC    Component Value Date/Time   WBC 6.0 03/15/2021 1900   RBC 4.27 03/15/2021 1900   HGB 13.7 03/15/2021 1900   HGB 14.0 11/23/2020 1709   HCT 39.5 03/15/2021 1900   HCT 40.4 11/23/2020 1709   PLT 264 03/15/2021 1900   MCV 92.5 03/15/2021 1900   MCV 90 11/23/2020 1709   MCH 32.1 03/15/2021 1900   MCHC 34.7 03/15/2021 1900   RDW 13.2 03/15/2021 1900   RDW 12.6 11/23/2020 1709   LYMPHSABS 1.5 11/23/2020 1709   MONOABS 0.5 11/14/2018 0910   EOSABS 0.1 11/23/2020 1709   BASOSABS 0.0 11/23/2020 1709      Chest Imaging: CT A/P 03/2019 reviewed by me and remarkable for clear lungs  CXR 03/2018 reviewed by me and remarkable for clear lungs  CXR 03/15/21 reviewed by me and unremarkable   Pulmonary Functions Testing Results: PFT Results Latest Ref Rng & Units 04/26/2021  FVC-Pre L 2.60  FVC-Predicted Pre % 91  FVC-Post L 2.45  FVC-Predicted Post % 86  Pre FEV1/FVC % % 85  Post FEV1/FCV % % 89  FEV1-Pre L 2.21  FEV1-Predicted Pre % 103  FEV1-Post L 2.19  DLCO uncorrected ml/min/mmHg 20.08  DLCO UNC% % 107  DLCO corrected ml/min/mmHg 20.08  DLCO COR %Predicted % 107  DLVA Predicted % 114  TLC L 4.20  TLC % Predicted % 85  RV % Predicted % 76    Reviewed by me and unremarkable  Assessment & Plan:   # Cough: # DOE: # Fatigue:  # Mild-moderate OSA: I think she could stand to benefit symptomatically from trial of AutoPAP but she declines today. She does still appear mildly volume overloaded on exam - has never had TTE or stress  test. Asthma possible but PFTs not suggestive of it and LABA/LAMA/ICS may pose some risk with her refractory glaucoma. If workup unrevealing then mood disturbance with anxiety/panic may be another consideration. Regardless overall she is doing better today than at last visit.  # Otitis/sinusitis: appears to have resolved on exam and her symptoms are improved although she does  have some residual boggy/edematous appearing nasal mucosa  Plan: - ENT referral placed today for recurrent issues with CRS, otitis/otalgia/aural fullness - she will read more about autopap/cpap at home and will hold off on starting now. 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   I spent 39 minutes dedicated to the care of this patient on the date of this encounter to include pre-visit review of records, face-to-face time with the patient discussing conditions above, post visit ordering of testing, clinical documentation with the electronic health record, making appropriate referrals as documented, and communicating necessary findings to members of the patients care team.     Maryjane Hurter, MD [facility] 04/26/2021 4:43 PM

## 2021-05-02 DIAGNOSIS — C44319 Basal cell carcinoma of skin of other parts of face: Secondary | ICD-10-CM | POA: Diagnosis not present

## 2021-05-18 ENCOUNTER — Ambulatory Visit
Admission: RE | Admit: 2021-05-18 | Discharge: 2021-05-18 | Disposition: A | Discharge status: Home / Self Care | Payer: PPO | Source: Ambulatory Visit | Attending: Nurse Practitioner | Admitting: Nurse Practitioner

## 2021-05-18 ENCOUNTER — Ambulatory Visit (INDEPENDENT_AMBULATORY_CARE_PROVIDER_SITE_OTHER): Payer: PPO | Admitting: Sports Medicine

## 2021-05-18 ENCOUNTER — Other Ambulatory Visit: Payer: Self-pay

## 2021-05-18 DIAGNOSIS — M170 Bilateral primary osteoarthritis of knee: Secondary | ICD-10-CM | POA: Diagnosis not present

## 2021-05-18 DIAGNOSIS — Z1231 Encounter for screening mammogram for malignant neoplasm of breast: Secondary | ICD-10-CM

## 2021-05-18 DIAGNOSIS — M79605 Pain in left leg: Secondary | ICD-10-CM | POA: Diagnosis not present

## 2021-05-18 MED ORDER — CELECOXIB 200 MG PO CAPS: ORAL_CAPSULE | ORAL | 2 refills | Status: DC

## 2021-05-18 NOTE — Progress Notes (Signed)
    Procedures performed today:    None.  Independent interpretation of notes and tests performed by another provider:   None.  Brief History, Exam, Impression, and Recommendations:    Primary osteoarthritis of both knees This is a very pleasant 70 year old female, known bilateral knee osteoarthritis, left knee was the last one injected back in March of this year, now with recurrence of pain, ibuprofen seems to work pretty well but she has to take a lot of it. I think it be a lot more convenient for [place] to switch her to Celebrex, we will do 200 mg 1-2 tabs daily. Home conditioning given, she will continue her compression wrap and look into purchasing an over-the-counter knee sleeve. Return to see me in 4 to 6 weeks, steroid injection if no better, if that fails we will consider viscosupplementation/PRP.  Chronic process with exacerbation and pharmacologic intervention  ___________________________________________ Gwen Her. Dianah Field, M.D., ABFM., CAQSM. Primary Care and [place] Instructor of [place] of [facility] of [facility]

## 2021-05-18 NOTE — Assessment & Plan Note (Signed)
This is a very pleasant 70 year old female, known bilateral knee osteoarthritis, left knee was the last one injected back in March of this year, now with recurrence of pain, ibuprofen seems to work pretty well but she has to take a lot of it. I think it be a lot more convenient for [place] to switch her to Celebrex, we will do 200 mg 1-2 tabs daily. Home conditioning given, she will continue her compression wrap and look into purchasing an over-the-counter knee sleeve. Return to see me in 4 to 6 weeks, steroid injection if no better, if that fails we will consider viscosupplementation/PRP.

## 2021-05-30 ENCOUNTER — Telehealth: Payer: Self-pay

## 2021-05-30 ENCOUNTER — Telehealth (HOSPITAL_BASED_OUTPATIENT_CLINIC_OR_DEPARTMENT_OTHER): Payer: Self-pay

## 2021-05-30 DIAGNOSIS — M47816 Spondylosis without myelopathy or radiculopathy, lumbar region: Secondary | ICD-10-CM

## 2021-05-30 MED ORDER — DEXAMETHASONE 4 MG PO TABS: 4.0000 mg | ORAL_TABLET | Freq: Three times a day (TID) | ORAL | 0 refills | Status: DC

## 2021-05-30 NOTE — Telephone Encounter (Signed)
Sounds radicular, adding a burst of dexamethasone, she will need to see me for further follow-up.

## 2021-05-30 NOTE — Telephone Encounter (Signed)
Patient reports not doing well with prednisone. Advised this is a little different and to give it a try. If she has the same reaction, she is going to stop the medication immediately and notify us.

## 2021-05-30 NOTE — Telephone Encounter (Signed)
Patient called to report that the pain in her knee is moving up into her hip and low back. She is unable to sleep and the Celebrex is not helping. She would like something stronger. Please advise.

## 2021-05-30 NOTE — Telephone Encounter (Signed)
Patient called in to ask Sarabeth to send in a prescription for her URI symptoms Patient states her husband is being treated for URI with antibiotics and steroids and he has now passed it to her She denies feeling the need for a steroid She states "I think if she could just send me in an antibiotic I should be fine" I advised patient that I would send to Worthy Keeler, DNP She is aware and agreeable

## 2021-06-01 IMAGING — US ULTRASOUND ABDOMEN COMPLETE
1 series · 14 of 25 positions shown · non-contrast
Comparison: 10/04/2014

CLINICAL DATA: Right upper quadrant pain for several years

EXAM:
ABDOMEN ULTRASOUND COMPLETE

[Series 1: ultrasound abdomen complete · 0.23mm/px · 14 of 85 slices shown]
[im 1/85]
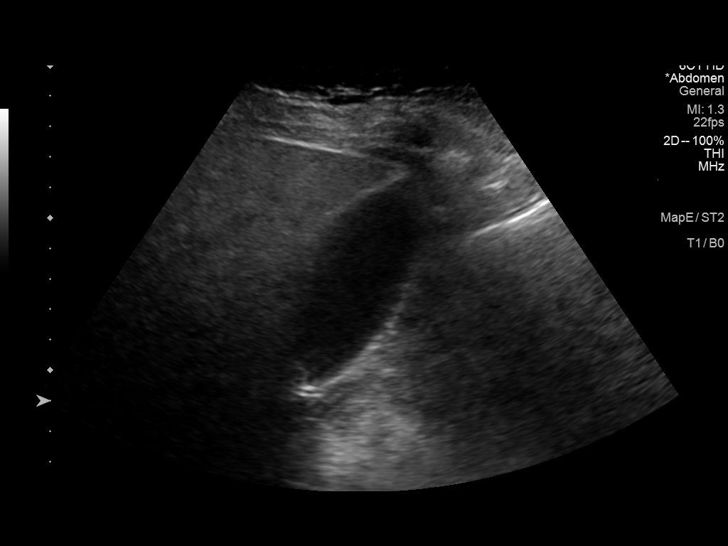
[im 8/85]
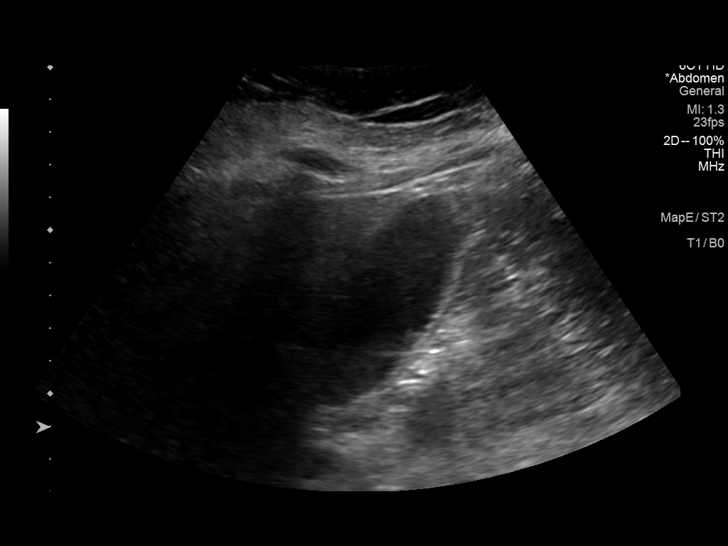
[im 15/85]
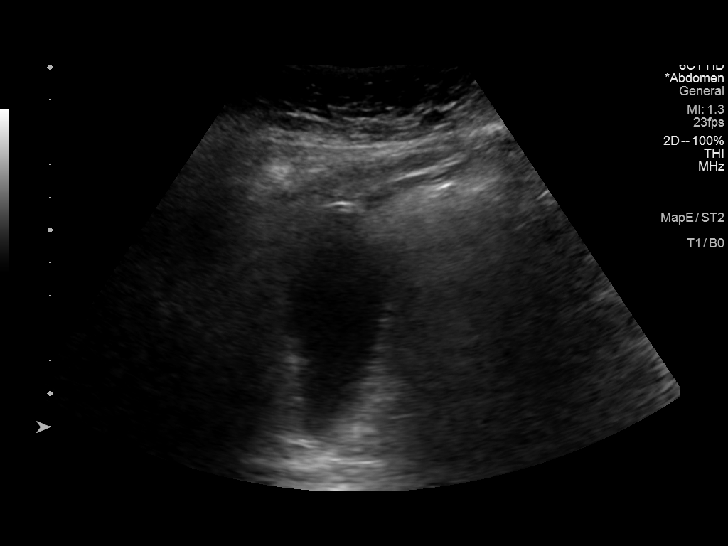
[im 22/85]
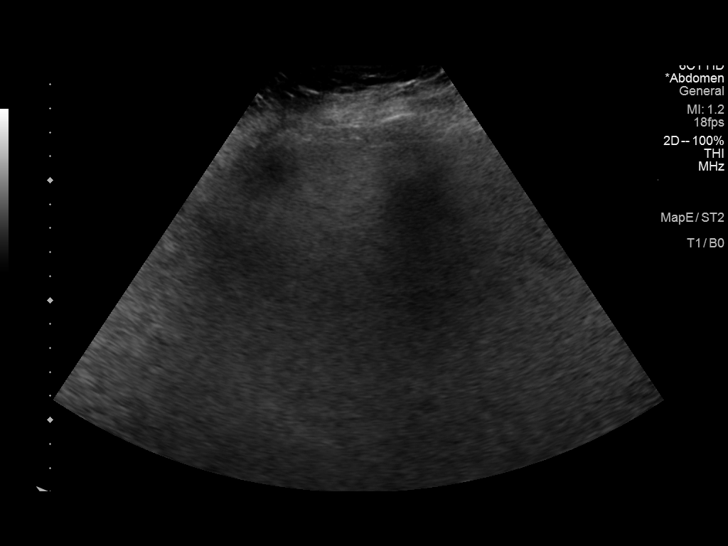
[im 29/85]
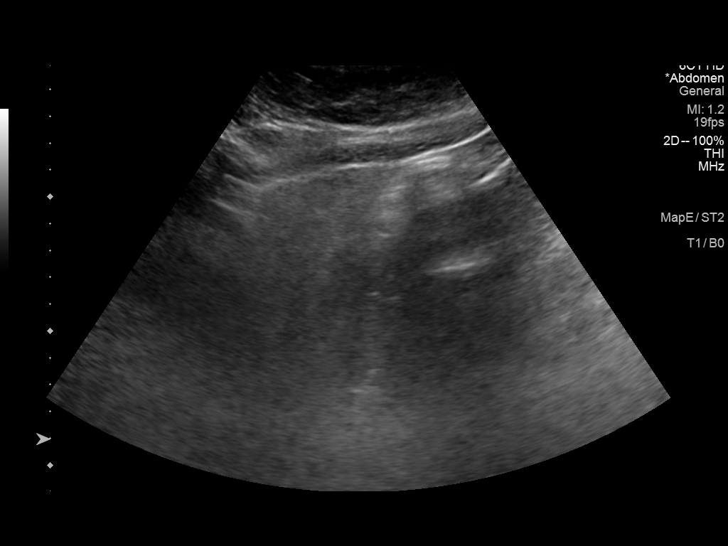
[im 32/85]
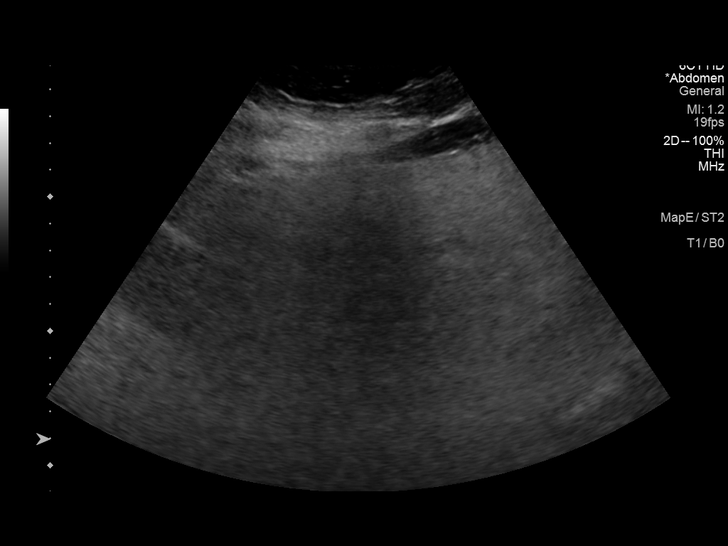
[im 39/85]
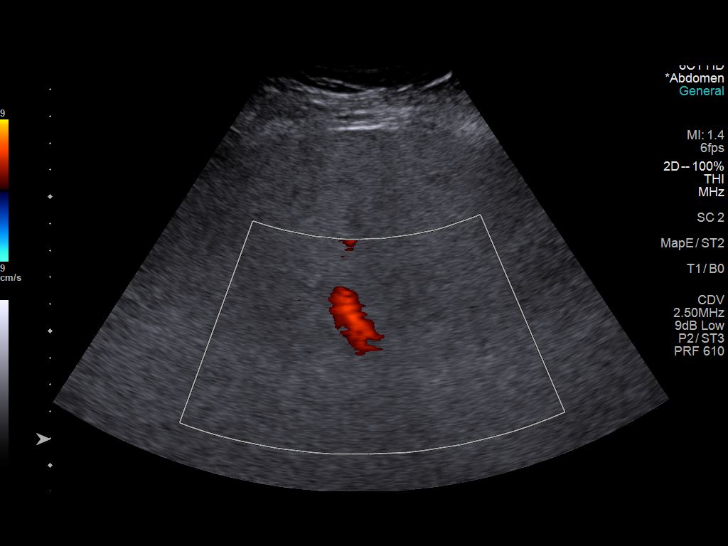
[im 46/85]
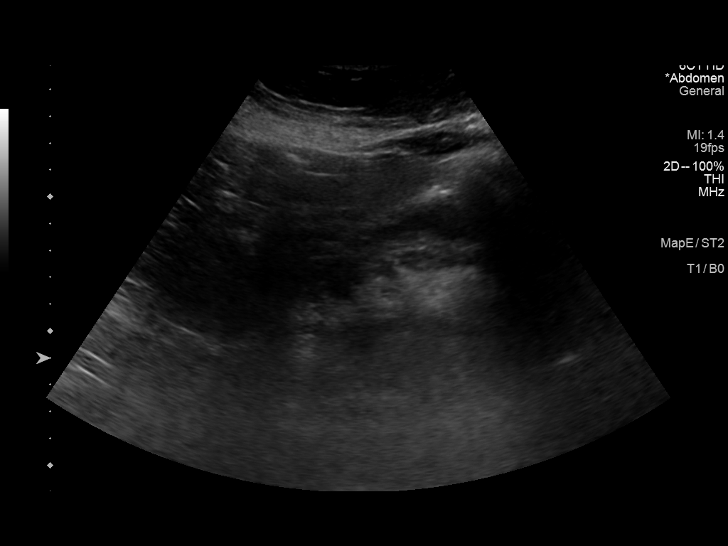
[im 53/85]
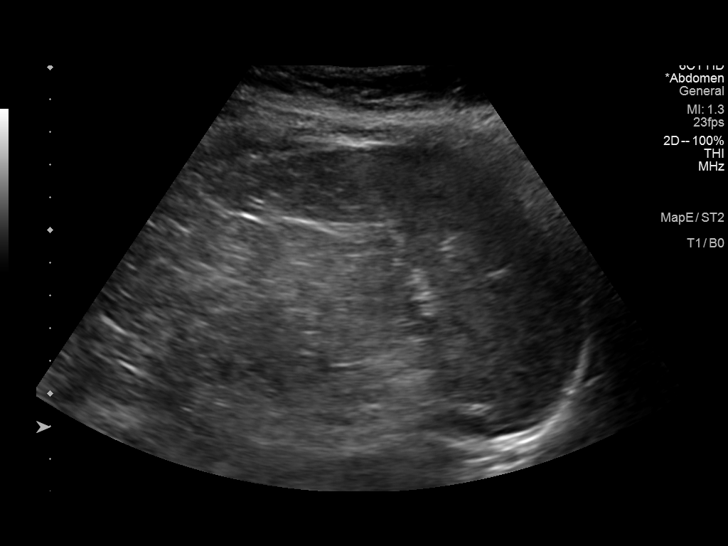
[im 57/85]
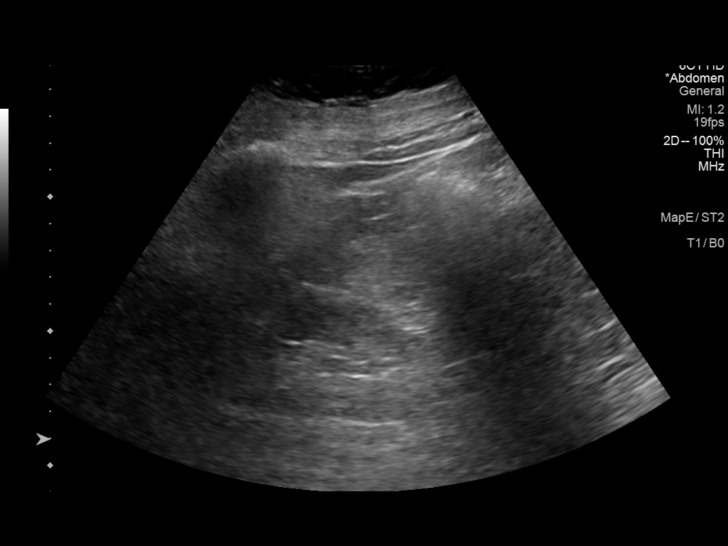
[im 64/85]
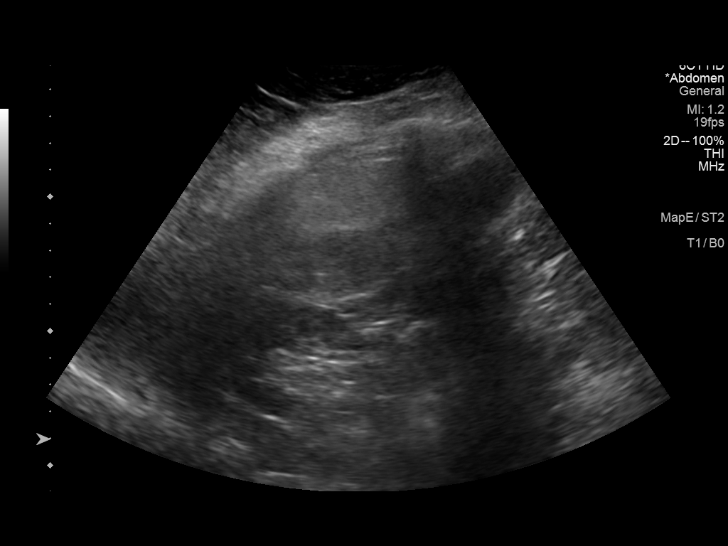
[im 71/85]
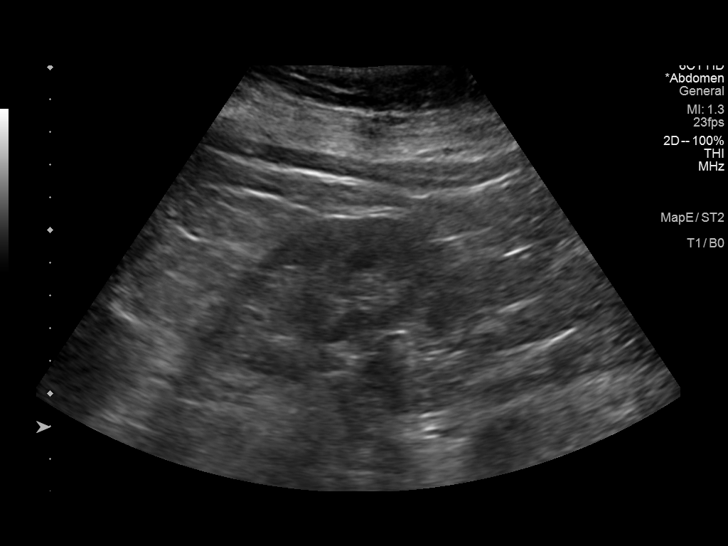
[im 78/85]
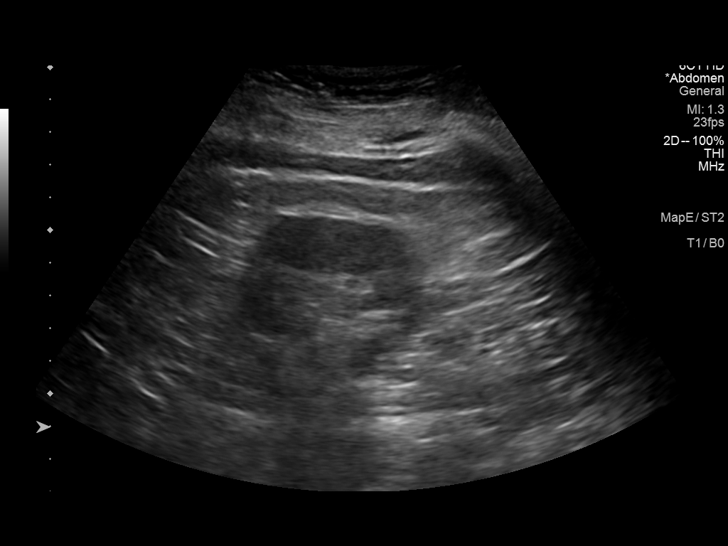
[im 85/85]
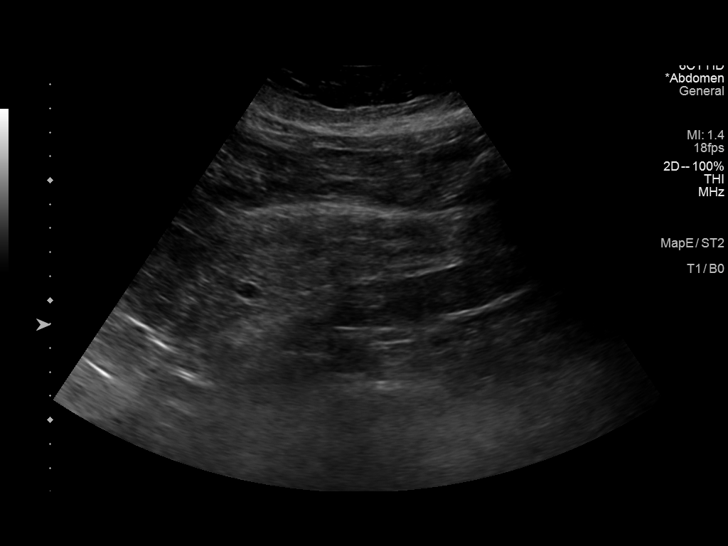

[14 of 25 positions shown; findings below may reference images not displayed]

FINDINGS: Gallbladder: No gallstones or wall thickening visualized. No
sonographic Murphy sign noted by sonographer.

Common bile duct: Diameter: 2.1 mm.

Liver: Increased echogenicity with difficult penetration consistent
with fatty infiltration. No focal mass is seen. Portal vein is
patent on color Doppler imaging with normal direction of blood flow
towards the liver.

IVC: Not well visualized.

Pancreas: Not well visualized.

Spleen: Size and appearance within normal limits.

Right Kidney: Length: 11 cm. Echogenicity within normal limits. No
mass or hydronephrosis visualized.

Left Kidney: Length: 10.8 cm. Echogenicity within normal limits. No
mass or hydronephrosis visualized.

Abdominal aorta: No aneurysm visualized.

Other findings: None.
IMPRESSION: Somewhat limited exam due to overlying bowel gas. No acute
abnormality is noted.

Fatty liver stable from the prior exam.

## 2021-06-07 ENCOUNTER — Other Ambulatory Visit (HOSPITAL_BASED_OUTPATIENT_CLINIC_OR_DEPARTMENT_OTHER): Payer: Self-pay

## 2021-06-07 DIAGNOSIS — H401112 Primary open-angle glaucoma, right eye, moderate stage: Secondary | ICD-10-CM | POA: Diagnosis not present

## 2021-06-15 ENCOUNTER — Ambulatory Visit: Payer: PPO | Admitting: Sports Medicine

## 2021-06-28 ENCOUNTER — Ambulatory Visit (INDEPENDENT_AMBULATORY_CARE_PROVIDER_SITE_OTHER): Payer: PPO

## 2021-06-28 ENCOUNTER — Other Ambulatory Visit: Payer: Self-pay

## 2021-06-28 ENCOUNTER — Ambulatory Visit (INDEPENDENT_AMBULATORY_CARE_PROVIDER_SITE_OTHER): Payer: PPO | Admitting: Sports Medicine

## 2021-06-28 DIAGNOSIS — M170 Bilateral primary osteoarthritis of knee: Secondary | ICD-10-CM

## 2021-06-28 NOTE — Progress Notes (Signed)
° ° °  Procedures performed today:    Procedure: Real-time Ultrasound Guided injection of the left knee Device: Samsung HS60  Verbal informed consent obtained.  Time-out conducted.  Noted no overlying erythema, induration, or other signs of local infection.  Skin prepped in a sterile fashion.  Local anesthesia: Topical Ethyl chloride.  With sterile technique and under real time ultrasound guidance: Noted trace effusion, 1 cc Kenalog 40, 2 cc lidocaine, 2 cc bupivacaine injected easily Completed without difficulty  Advised to call if fevers/chills, erythema, induration, drainage, or persistent bleeding.  Images permanently stored and available for review in PACS.  Impression: Technically successful ultrasound guided injection.  Independent interpretation of notes and tests performed by another provider:   None.  Brief History, Exam, Impression, and Recommendations:    Primary osteoarthritis of both knees Sugar returns, she is a pleasant 71 year old female, known bilateral knee osteoarthritis, we switched to Celebrex at the last visit, had a recent home conditioning, compression wrap, continues to have discomfort today so we did a steroid injection. If this fails we will proceed with viscosupplementation. Return to see me in 6 weeks.    ___________________________________________ Gwen Her. Dianah Field, M.D., ABFM., CAQSM. Primary Care and [place] Instructor of [place] of [facility] of Medicine

## 2021-06-28 NOTE — Assessment & Plan Note (Signed)
Emily [name] returns, she is a pleasant 71 year old female, known bilateral knee osteoarthritis, we switched to Celebrex at the last visit, had a recent home conditioning, compression wrap, continues to have discomfort today so we did a steroid injection. If this fails we will proceed with viscosupplementation. Return to see me in 6 weeks.

## 2021-06-29 ENCOUNTER — Other Ambulatory Visit (HOSPITAL_BASED_OUTPATIENT_CLINIC_OR_DEPARTMENT_OTHER): Payer: Self-pay

## 2021-06-29 ENCOUNTER — Ambulatory Visit (INDEPENDENT_AMBULATORY_CARE_PROVIDER_SITE_OTHER): Payer: PPO | Admitting: Family Medicine

## 2021-06-29 ENCOUNTER — Encounter (HOSPITAL_BASED_OUTPATIENT_CLINIC_OR_DEPARTMENT_OTHER): Payer: Self-pay | Admitting: Family Medicine

## 2021-06-29 DIAGNOSIS — B9689 Other specified bacterial agents as the cause of diseases classified elsewhere: Secondary | ICD-10-CM | POA: Diagnosis not present

## 2021-06-29 DIAGNOSIS — J019 Acute sinusitis, unspecified: Secondary | ICD-10-CM

## 2021-06-29 HISTORY — DX: Other specified bacterial agents as the cause of diseases classified elsewhere: B96.89

## 2021-06-29 MED ORDER — LEVOFLOXACIN 500 MG PO TABS
500.0000 mg | ORAL_TABLET | Freq: Every day | ORAL | 0 refills | Status: AC
  Filled 2021-06-29: qty 5, 5d supply, fill #0

## 2021-06-29 NOTE — Assessment & Plan Note (Signed)
Reports that for about a week she has been having sinus pressure, headache, sinus congestion.  Symptoms have been primarily over right maxillary and frontal sinuses Denies any associated fevers, chills, night sweats Has been tolerating p.o. intake.  Has been using Mucinex, Claritin, Advil to help with symptoms.  Has also tried warm compress During visit, she is able to speak in full sentences without obvious difficulty breathing Discussed options with patient Recommend utilizing intranasal saline spray or irrigation as well as intranasal steroid spray She has reported allergies to penicillin and doxycycline which does limit antibiotic options for treatment.  Discussed this with patient, will proceed with levofloxacin once daily for 5 days to try to help treat symptoms.  Did discuss potential adverse effects related to levofloxacin use. Recommend in office follow-up if symptoms are not improving as expected in the coming days

## 2021-06-29 NOTE — Progress Notes (Signed)
° °  Virtual Visit via Telephone   I connected with  Emily [name]  on 06/29/21 by telephone/telehealth and verified that I am speaking with the correct person using two identifiers.   I discussed the limitations, risks, security and privacy concerns of performing an evaluation and management service by telephone, including the higher likelihood of inaccurate diagnosis and treatment, and the availability of in person appointments.  We also discussed the likely need of an additional face to face encounter for complete and high quality delivery of care.  I also discussed with the patient that there may be a patient responsible charge related to this service. The patient expressed understanding and wishes to proceed.  Provider location is in medical facility. Patient location is at their home, different from provider location. People involved in care of the patient during this telehealth encounter were myself, my nurse/medical assistant, and my front office/scheduling team member.  Review of Systems: No fevers, chills, night sweats, weight loss, chest pain, or shortness of breath.   Objective Findings:    General: Speaking full sentences, no audible heavy breathing.  Sounds alert and appropriately interactive.    Independent interpretation of tests performed by another provider:   None.  Brief History, Exam, Impression, and Recommendations:    Acute bacterial sinusitis Reports that for about a week she has been having sinus pressure, headache, sinus congestion.  Symptoms have been primarily over right maxillary and frontal sinuses Denies any associated fevers, chills, night sweats Has been tolerating p.o. intake.  Has been using Mucinex, Claritin, Advil to help with symptoms.  Has also tried warm compress During visit, she is able to speak in full sentences without obvious difficulty breathing Discussed options with patient Recommend utilizing intranasal saline spray or irrigation as  well as intranasal steroid spray She has reported allergies to penicillin and doxycycline which does limit antibiotic options for treatment.  Discussed this with patient, will proceed with levofloxacin once daily for 5 days to try to help treat symptoms.  Did discuss potential adverse effects related to levofloxacin use. Recommend in office follow-up if symptoms are not improving as expected in the coming days  I discussed the above assessment and treatment plan with the patient. The patient was provided an opportunity to ask questions and all were answered. The patient agreed with the plan and demonstrated an understanding of the instructions.   The patient was advised to call back or seek an in-person evaluation if the symptoms worsen or if the condition fails to improve as anticipated.   I provided 15 minutes of face to face and non-face-to-face time during this encounter date, time was needed to gather information, review chart, records, communicate/coordinate with staff remotely, as well as complete documentation.   ___________________________________________ Deloss Amico de [place], MD, ABFM, CAQSM Primary Care and [place]

## 2021-07-05 ENCOUNTER — Other Ambulatory Visit (HOSPITAL_BASED_OUTPATIENT_CLINIC_OR_DEPARTMENT_OTHER): Payer: Self-pay

## 2021-08-02 ENCOUNTER — Other Ambulatory Visit (HOSPITAL_BASED_OUTPATIENT_CLINIC_OR_DEPARTMENT_OTHER): Payer: Self-pay

## 2021-08-02 ENCOUNTER — Other Ambulatory Visit: Payer: Self-pay

## 2021-08-02 ENCOUNTER — Ambulatory Visit (INDEPENDENT_AMBULATORY_CARE_PROVIDER_SITE_OTHER): Payer: PPO | Admitting: Nurse Practitioner

## 2021-08-02 DIAGNOSIS — J0120 Acute ethmoidal sinusitis, unspecified: Secondary | ICD-10-CM | POA: Diagnosis not present

## 2021-08-02 MED ORDER — AZITHROMYCIN 250 MG PO TABS
ORAL_TABLET | ORAL | 0 refills | Status: AC
Start: 2021-08-02 — End: 2021-08-07
  Filled 2021-08-02: qty 6, 5d supply, fill #0

## 2021-08-02 NOTE — Progress Notes (Addendum)
Virtual Visit Encounter telephone visit.   I connected with  Emily [name] on 08/05/21 at  1:10 PM EST by secure audio and/or video enabled telemedicine application. I verified that I am speaking with the correct person using two identifiers.   I introduced myself as a Designer, jewellery with the practice. The limitations of evaluation and management by telemedicine discussed with the patient and the availability of in person appointments. The patient expressed verbal understanding and consent to proceed.  Participating parties in this visit include: Myself and patient  The patient is: Patient Location: Home I am: Provider Location: Office/Clinic Subjective:    CC and HPI: Emily [name] is a 71 y.o. year old female presenting for new evaluation and treatment of sore throat, body aches, congestion, ear pain, sinus pain/pressure, headache, and dry cough. She has a history of recurrent sinusitis and respiratory complications. She did have a sinus infection in [place] January for which she took levaquin. She was able to take 2 doses of the medication but it caused significant nausea and GI distress and she stopped the medication. She reports the two doses did help with symptoms but did not completely go away. She is now experiencing worsening of symptoms again.    Past medical history, Surgical history, Family history not pertinant except as noted below, Social history, Allergies, and medications have been entered into the medical record, reviewed, and corrections made.   Review of Systems:  All review of systems negative except what is listed in the HPI  Objective:    Alert and oriented x 4 Speaking in clear sentences with no shortness of breath. Audibly congested No distress.  Impression and Recommendations:    Problem List Items Addressed This Visit     Acute non-recurrent ethmoidal sinusitis - Primary    Symptoms and presentation consistent with continuation of sinusitis  after incomplete dosing with previous antibiotic therapy. She has responded favorably and tolerated azithromycin well in the past to help with symptoms.  Will trial this again and monitor for symptom improvement.  Patient instructed to contact the office or seek care if symptoms worsen or fail to improve.       Relevant Medications   azithromycin (ZITHROMAX) 250 MG tablet    orders and follow up as documented in EMR I discussed the assessment and treatment plan with the patient. The patient was provided an opportunity to ask questions and all were answered. The patient agreed with the plan and demonstrated an understanding of the instructions.   The patient was advised to call back or seek an in-person evaluation if the symptoms worsen or if the condition fails to improve as anticipated.  Follow-Up: prn  I provided 18 minutes of non-face-to-face interaction with this non face-to-face encounter including intake, same-day documentation, and chart review.   Orma Render, NP , DNP, AGNP-c [place] [facility] [phone] (fax)

## 2021-08-05 ENCOUNTER — Encounter (HOSPITAL_BASED_OUTPATIENT_CLINIC_OR_DEPARTMENT_OTHER): Payer: Self-pay | Admitting: Nurse Practitioner

## 2021-08-05 DIAGNOSIS — J0120 Acute ethmoidal sinusitis, unspecified: Secondary | ICD-10-CM

## 2021-08-05 HISTORY — DX: Acute ethmoidal sinusitis, unspecified: J01.20

## 2021-08-05 NOTE — Assessment & Plan Note (Signed)
Symptoms and presentation consistent with continuation of sinusitis after incomplete dosing with previous antibiotic therapy. She has responded favorably and tolerated azithromycin well in the past to help with symptoms.  Will trial this again and monitor for symptom improvement.  Patient instructed to contact the office or seek care if symptoms worsen or fail to improve.

## 2021-08-09 ENCOUNTER — Ambulatory Visit (INDEPENDENT_AMBULATORY_CARE_PROVIDER_SITE_OTHER): Payer: PPO | Admitting: Sports Medicine

## 2021-08-09 ENCOUNTER — Other Ambulatory Visit: Payer: Self-pay

## 2021-08-09 ENCOUNTER — Other Ambulatory Visit (HOSPITAL_BASED_OUTPATIENT_CLINIC_OR_DEPARTMENT_OTHER): Payer: Self-pay

## 2021-08-09 ENCOUNTER — Telehealth: Payer: Self-pay | Admitting: Sports Medicine

## 2021-08-09 DIAGNOSIS — M170 Bilateral primary osteoarthritis of knee: Secondary | ICD-10-CM

## 2021-08-09 MED ORDER — TRAMADOL HCL 50 MG PO TABS
50.0000 mg | ORAL_TABLET | Freq: Three times a day (TID) | ORAL | 0 refills | Status: DC | PRN
Start: 2021-08-09 — End: 2021-08-29
  Filled 2021-08-09: qty 21, 7d supply, fill #0

## 2021-08-09 NOTE — Assessment & Plan Note (Signed)
This is a pleasant 71 year old female, she has known knee osteoarthritis, bilateral, historically has done well with injections, we did a left knee injection at the last visit and unfortunately she has not noted any improvement. She does have some weakness, pain anteriorly, the plan is viscosupplementation but due to her weakness I would like to proceed with an MRI.  Looking for internal derangements other than simple arthritis. We will also get her approved for Visco. Adding tramadol for pain in the meantime.

## 2021-08-09 NOTE — Telephone Encounter (Signed)
Please work on [employer] or any other viscosupplementation approval for the left knee, x-ray confirmed osteoarthritis, failed greater than 6 weeks of physician directed conservative treatment, NSAIDs, analgesics and multiple injections.

## 2021-08-09 NOTE — Progress Notes (Signed)
° ° °  Procedures performed today:    None.  Independent interpretation of notes and tests performed by another provider:   None.  Brief History, Exam, Impression, and Recommendations:    Primary osteoarthritis of both knees This is a pleasant 71 year old female, she has known knee osteoarthritis, bilateral, historically has done well with injections, we did a left knee injection at the last visit and unfortunately she has not noted any improvement. She does have some weakness, pain anteriorly, the plan is viscosupplementation but due to [name] weakness I would like to proceed with an MRI.  Looking for internal derangements other than simple arthritis. We will also get [name] approved for Visco. Adding tramadol for pain in the meantime.  Chronic process with exacerbation and pharmacologic intervention  ___________________________________________ Emily [name]. Dianah Field, M.D., ABFM., CAQSM. Primary Care and [place] Instructor of [employer]

## 2021-08-10 NOTE — Telephone Encounter (Signed)
[employer] paperwork faxed to [employer] at [phone] Request is for Orthovisc or Monovisc Pt's insurance prefers Orthovisc Fax confirmation receipt received

## 2021-08-12 NOTE — Telephone Encounter (Addendum)
Benefits Investigation Details received from [facility] °Injection: Orthovisc ° °Medical: Deductible does not apply. Once the OOP has been met, patient is covered at 100%. Only one copay applies per date of service. PA is not required.  °PA required: No ° °Pharmacy: The products are not covered under the pharmacy plan.  ° °Specialty Pharmacy: [facility] ° °May fill through: [facility] OR Specialty Pharmacy °OV Copay/Coinsurance:  °Product Copay: $15 °Administration Coinsurance:  °Administration Copay:  °Deductible: $3000 (met: $41.82) °Out of Pocket Max:  ° °Spoke with patient regarding her cost and she wishes to proceed. Call transferred to front desk for scheduling. Benefit detail taken to front desk for copy information.  °

## 2021-08-12 NOTE — Telephone Encounter (Signed)
[facility] requested a copy of patient's insurance cards, front and back. Cards faxed and confirmation rec'd.

## 2021-08-16 ENCOUNTER — Other Ambulatory Visit: Payer: Self-pay

## 2021-08-16 ENCOUNTER — Ambulatory Visit (INDEPENDENT_AMBULATORY_CARE_PROVIDER_SITE_OTHER): Payer: PPO

## 2021-08-16 DIAGNOSIS — S83242A Other tear of medial meniscus, current injury, left knee, initial encounter: Secondary | ICD-10-CM | POA: Diagnosis not present

## 2021-08-16 DIAGNOSIS — R531 Weakness: Secondary | ICD-10-CM | POA: Diagnosis not present

## 2021-08-16 DIAGNOSIS — M7122 Synovial cyst of popliteal space [Baker], left knee: Secondary | ICD-10-CM | POA: Diagnosis not present

## 2021-08-16 DIAGNOSIS — M25462 Effusion, left knee: Secondary | ICD-10-CM | POA: Diagnosis not present

## 2021-08-16 DIAGNOSIS — M170 Bilateral primary osteoarthritis of knee: Secondary | ICD-10-CM | POA: Diagnosis not present

## 2021-08-18 ENCOUNTER — Ambulatory Visit (INDEPENDENT_AMBULATORY_CARE_PROVIDER_SITE_OTHER): Payer: PPO | Admitting: Sports Medicine

## 2021-08-18 ENCOUNTER — Other Ambulatory Visit: Payer: Self-pay

## 2021-08-18 ENCOUNTER — Ambulatory Visit (INDEPENDENT_AMBULATORY_CARE_PROVIDER_SITE_OTHER): Payer: PPO

## 2021-08-18 VITALS — BP 132/85 | HR 93

## 2021-08-18 DIAGNOSIS — M170 Bilateral primary osteoarthritis of knee: Secondary | ICD-10-CM

## 2021-08-18 NOTE — Assessment & Plan Note (Signed)
Orthovisc No. 1 of 4 left knee, return in 1 week for #2 of 4. 

## 2021-08-18 NOTE — Progress Notes (Signed)
? ? ?  Procedures performed today:   ? ?Procedure: Real-time Ultrasound Guided injection of the left knee ?Device: Samsung HS60  ?Verbal informed consent obtained.  ?Time-out conducted.  ?Noted no overlying erythema, induration, or other signs of local infection.  ?Skin prepped in a sterile fashion.  ?Local anesthesia: Topical Ethyl chloride.  ?With sterile technique and under real time ultrasound guidance: Noted mild effusion, 30 mg/2 mL of OrthoVisc (sodium hyaluronate) in a prefilled syringe was injected easily into the knee through a 22-gauge needle. ?Completed without difficulty  ?Advised to call if fevers/chills, erythema, induration, drainage, or persistent bleeding.  ?Images permanently stored and available for review in PACS.  ?Impression: Technically successful ultrasound guided injection. ? ?Independent interpretation of notes and tests performed by another provider:  ? ?None. ? ?Brief History, Exam, Impression, and Recommendations:   ? ?Primary osteoarthritis of both knees ?Orthovisc No. 1 of 4 left knee, return in 1 week for #2 of 4. ? ? ? ?___________________________________________ ?Gwen Her. Dianah Field, M.D., ABFM., CAQSM. ?Primary Care and Sports Medicine ?[place] ? ?Adjunct Instructor of Family Medicine  ?[employer] ?

## 2021-08-25 ENCOUNTER — Ambulatory Visit (INDEPENDENT_AMBULATORY_CARE_PROVIDER_SITE_OTHER): Payer: PPO

## 2021-08-25 ENCOUNTER — Other Ambulatory Visit: Payer: Self-pay

## 2021-08-25 ENCOUNTER — Ambulatory Visit: Payer: PPO | Admitting: Sports Medicine

## 2021-08-25 DIAGNOSIS — M170 Bilateral primary osteoarthritis of knee: Secondary | ICD-10-CM

## 2021-08-25 NOTE — Progress Notes (Signed)
? ? ?  Procedures performed today:   ? ?Procedure: Real-time Ultrasound Guided injection of the left knee ?Device: Samsung HS60  ?Verbal informed consent obtained.  ?Time-out conducted.  ?Noted no overlying erythema, induration, or other signs of local infection.  ?Skin prepped in a sterile fashion.  ?Local anesthesia: Topical Ethyl chloride.  ?With sterile technique and under real time ultrasound guidance: Noted mild effusion, 30 mg/2 mL of OrthoVisc (sodium hyaluronate) in a prefilled syringe was injected easily into the knee through a 22-gauge needle. ?Completed without difficulty  ?Advised to call if fevers/chills, erythema, induration, drainage, or persistent bleeding.  ?Images permanently stored and available for review in PACS.  ?Impression: Technically successful ultrasound guided injection. ? ?Independent interpretation of notes and tests performed by another provider:  ? ?None. ? ?Brief History, Exam, Impression, and Recommendations:   ? ?Primary osteoarthritis of both knees ?Orthovisc 2 of 4 left knee, return in 1 week for #3 of 4. ? ? ? ?___________________________________________ ?Gwen Her. Dianah Field, M.D., ABFM., CAQSM. ?Primary Care and Sports Medicine ?[place] ? ?Adjunct Instructor of Family Medicine  ?[employer] ?

## 2021-08-25 NOTE — Assessment & Plan Note (Signed)
Orthovisc 2 of 4 left knee, return in 1 week for #3 of 4 

## 2021-08-29 ENCOUNTER — Other Ambulatory Visit (HOSPITAL_BASED_OUTPATIENT_CLINIC_OR_DEPARTMENT_OTHER): Payer: Self-pay

## 2021-08-29 ENCOUNTER — Other Ambulatory Visit: Payer: Self-pay | Admitting: Sports Medicine

## 2021-08-29 DIAGNOSIS — M170 Bilateral primary osteoarthritis of knee: Secondary | ICD-10-CM

## 2021-08-29 MED ORDER — TRAMADOL HCL 50 MG PO TABS
50.0000 mg | ORAL_TABLET | Freq: Three times a day (TID) | ORAL | 0 refills | Status: DC | PRN
  Filled 2021-08-29: qty 30, 10d supply, fill #0

## 2021-08-30 DIAGNOSIS — H04122 Dry eye syndrome of left lacrimal gland: Secondary | ICD-10-CM | POA: Diagnosis not present

## 2021-08-30 DIAGNOSIS — H401112 Primary open-angle glaucoma, right eye, moderate stage: Secondary | ICD-10-CM | POA: Diagnosis not present

## 2021-08-31 ENCOUNTER — Ambulatory Visit: Payer: PPO | Admitting: Sports Medicine

## 2021-08-31 ENCOUNTER — Other Ambulatory Visit: Payer: Self-pay

## 2021-08-31 ENCOUNTER — Ambulatory Visit (INDEPENDENT_AMBULATORY_CARE_PROVIDER_SITE_OTHER): Payer: PPO

## 2021-08-31 DIAGNOSIS — M170 Bilateral primary osteoarthritis of knee: Secondary | ICD-10-CM | POA: Diagnosis not present

## 2021-08-31 NOTE — Assessment & Plan Note (Signed)
Orthovisc 3 of 4 left knee, return in 1 week for #4 of 4. 

## 2021-08-31 NOTE — Progress Notes (Signed)
? ? ?  Procedures performed today:   ? ?Procedure: Real-time Ultrasound Guided injection of the left knee ?Device: Samsung HS60  ?Verbal informed consent obtained.  ?Time-out conducted.  ?Noted no overlying erythema, induration, or other signs of local infection.  ?Skin prepped in a sterile fashion.  ?Local anesthesia: Topical Ethyl chloride.  ?With sterile technique and under real time ultrasound guidance: Noted mild effusion, 30 mg/2 mL of OrthoVisc (sodium hyaluronate) in a prefilled syringe was injected easily into the knee through a 22-gauge needle. ?Completed without difficulty  ?Advised to call if fevers/chills, erythema, induration, drainage, or persistent bleeding.  ?Images permanently stored and available for review in PACS.  ?Impression: Technically successful ultrasound guided injection. ? ?Independent interpretation of notes and tests performed by another provider:  ? ?None. ? ?Brief History, Exam, Impression, and Recommendations:   ? ?Primary osteoarthritis of both knees ?Orthovisc 3 of 4 left knee, return in 1 week for #4 of 4. ? ? ? ?___________________________________________ ?Gwen Her. Dianah Field, M.D., ABFM., CAQSM. ?Primary Care and Sports Medicine ?[place] ? ?Adjunct Instructor of Family Medicine  ?[employer] ?

## 2021-09-01 ENCOUNTER — Ambulatory Visit: Payer: PPO | Admitting: Sports Medicine

## 2021-09-06 ENCOUNTER — Ambulatory Visit (INDEPENDENT_AMBULATORY_CARE_PROVIDER_SITE_OTHER): Payer: PPO

## 2021-09-06 ENCOUNTER — Ambulatory Visit (INDEPENDENT_AMBULATORY_CARE_PROVIDER_SITE_OTHER): Payer: PPO | Admitting: Sports Medicine

## 2021-09-06 ENCOUNTER — Other Ambulatory Visit: Payer: Self-pay

## 2021-09-06 DIAGNOSIS — F339 Major depressive disorder, recurrent, unspecified: Secondary | ICD-10-CM | POA: Diagnosis not present

## 2021-09-06 DIAGNOSIS — M170 Bilateral primary osteoarthritis of knee: Secondary | ICD-10-CM

## 2021-09-06 NOTE — Progress Notes (Signed)
? ? ?  Procedures performed today:   ? ?Procedure: Real-time Ultrasound Guided injection of the left knee ?Device: Samsung HS60  ?Verbal informed consent obtained.  ?Time-out conducted.  ?Noted no overlying erythema, induration, or other signs of local infection.  ?Skin prepped in a sterile fashion.  ?Local anesthesia: Topical Ethyl chloride.  ?With sterile technique and under real time ultrasound guidance: Noted mild effusion, 30 mg/2 mL of OrthoVisc (sodium hyaluronate) in a prefilled syringe was injected easily into the knee through a 22-gauge needle. ?Completed without difficulty  ?Advised to call if fevers/chills, erythema, induration, drainage, or persistent bleeding.  ?Images permanently stored and available for review in PACS.  ?Impression: Technically successful ultrasound guided injection. ? ?Independent interpretation of notes and tests performed by another provider:  ? ?None. ? ?Brief History, Exam, Impression, and Recommendations:   ? ?Primary osteoarthritis of both knees ?Orthovisc 4 of 4 left knee, only has about 30% relief so far after the third shot, hopefully we get significant additional relief from the fourth. ?If not she will be a candidate for arthroplasty.   ?We can do a telephone visit in 4 weeks. ? ?Depression, recurrent ([place]) ?History of depression, historically well controlled with trazodone, Pristiq 100 mg, Wellbutrin 150 mg. 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 ?I have suggested going up on Pristiq (maximum is 200 mg), she can discuss this with her PCP or psychiatrist, I also suggested contacting her behavioral therapist for discussion of additional grief counseling as she is going through her own grieving from this terrible event. ? ? ? ?___________________________________________ ?Gwen Her. Dianah Field, M.D., ABFM., CAQSM. ?Primary Care and Sports  Medicine ?[place] ? ?Adjunct Instructor of Family Medicine  ?[employer] ?

## 2021-09-06 NOTE — Assessment & Plan Note (Addendum)
Orthovisc 4 of 4 left knee, only has about 30% relief so far after the third shot, hopefully we get significant additional relief from the fourth. ?If not she will be a candidate for arthroplasty.   ?We can do a telephone visit in 4 weeks. ?

## 2021-09-06 NOTE — Assessment & Plan Note (Addendum)
History of depression, historically well controlled with trazodone, Pristiq 100 mg, Wellbutrin 150 mg. 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 ?I have suggested going up on Pristiq (maximum is 200 mg), she can discuss this with her PCP or psychiatrist, I also suggested contacting her behavioral therapist for discussion of additional grief counseling as she is going through her own grieving from this terrible event. ?

## 2021-09-14 ENCOUNTER — Other Ambulatory Visit (HOSPITAL_COMMUNITY): Payer: Self-pay

## 2021-09-21 ENCOUNTER — Other Ambulatory Visit (HOSPITAL_BASED_OUTPATIENT_CLINIC_OR_DEPARTMENT_OTHER): Payer: Self-pay

## 2021-09-21 ENCOUNTER — Other Ambulatory Visit: Payer: Self-pay | Admitting: Sports Medicine

## 2021-09-21 ENCOUNTER — Other Ambulatory Visit: Payer: Self-pay | Admitting: Nurse Practitioner

## 2021-09-21 DIAGNOSIS — M170 Bilateral primary osteoarthritis of knee: Secondary | ICD-10-CM

## 2021-09-21 DIAGNOSIS — H1033 Unspecified acute conjunctivitis, bilateral: Secondary | ICD-10-CM

## 2021-09-21 MED ORDER — AZELASTINE HCL 0.05 % OP SOLN
2.0000 [drp] | Freq: Two times a day (BID) | OPHTHALMIC | 11 refills | Status: DC
  Filled 2021-09-21: qty 6, 15d supply, fill #0

## 2021-09-22 ENCOUNTER — Other Ambulatory Visit (HOSPITAL_BASED_OUTPATIENT_CLINIC_OR_DEPARTMENT_OTHER): Payer: Self-pay

## 2021-09-23 ENCOUNTER — Other Ambulatory Visit (HOSPITAL_BASED_OUTPATIENT_CLINIC_OR_DEPARTMENT_OTHER): Payer: Self-pay

## 2021-09-23 MED ORDER — TRAMADOL HCL 50 MG PO TABS
50.0000 mg | ORAL_TABLET | Freq: Three times a day (TID) | ORAL | 2 refills | Status: DC | PRN
  Filled 2021-09-23: qty 30, 10d supply, fill #0
  Filled 2021-10-18: qty 30, 10d supply, fill #1
  Filled 2021-11-24: qty 30, 10d supply, fill #2

## 2021-09-28 ENCOUNTER — Other Ambulatory Visit (HOSPITAL_BASED_OUTPATIENT_CLINIC_OR_DEPARTMENT_OTHER): Payer: Self-pay

## 2021-09-29 ENCOUNTER — Other Ambulatory Visit (HOSPITAL_BASED_OUTPATIENT_CLINIC_OR_DEPARTMENT_OTHER): Payer: Self-pay

## 2021-09-29 MED ORDER — CYCLOSPORINE 0.05 % OP EMUL
OPHTHALMIC | 99 refills | Status: DC
  Filled 2021-09-29: qty 180, 90d supply, fill #0
  Filled 2022-02-21: qty 180, 90d supply, fill #1

## 2021-10-04 ENCOUNTER — Telehealth (INDEPENDENT_AMBULATORY_CARE_PROVIDER_SITE_OTHER): Payer: PPO | Admitting: Sports Medicine

## 2021-10-04 DIAGNOSIS — M170 Bilateral primary osteoarthritis of knee: Secondary | ICD-10-CM

## 2021-10-04 NOTE — Progress Notes (Signed)
? ?  Virtual Visit via Telephone ?  ?I connected with  Emily [name]  on 10/04/21 by telephone/telehealth and verified that I am speaking with the correct person using two identifiers. ?  ?I discussed the limitations, risks, security and privacy concerns of performing an evaluation and management service by telephone, including the higher likelihood of inaccurate diagnosis and treatment, and the availability of in person appointments.  We also discussed the likely need of an additional face to face encounter for complete and high quality delivery of care.  I also discussed with the patient that there may be a patient responsible charge related to this service. The patient expressed understanding and wishes to proceed. ? ?Provider location is in medical facility. ?Patient location is at their home, different from provider location. ?People involved in care of the patient during this telehealth encounter were myself, my nurse/medical assistant, and my front office/scheduling team member. ? ?Review of Systems: No fevers, chills, night sweats, weight loss, chest pain, or shortness of breath.  ? ?Objective Findings:   ? ?General: Speaking full sentences, no audible heavy breathing.  Sounds alert and appropriately interactive.   ? ?Independent interpretation of tests performed by another provider:  ? ?None. ? ?Brief History, Exam, Impression, and Recommendations:   ? ?Primary osteoarthritis of both knees ?Emily [name] is a pleasant 71 year old female, she is a Brewing technologist, she has history of knee osteoarthritis, we have tried multiple injections, activity modification, analgesics, we recently finished a series of Orthovisc back in March, only has about 30% relief, she is a candidate for knee arthroplasty now that she has limiting pain in spite of aggressive conservative treatment, referral to Dr. Berenice Primas for surgical opinion. ? ? ?I discussed the above assessment and treatment plan with the patient. The patient  was provided an opportunity to ask questions and all were answered. The patient agreed with the plan and demonstrated an understanding of the instructions. ?  ?The patient was advised to call back or seek an in-person evaluation if the symptoms worsen or if the condition fails to improve as anticipated. ?  ?I provided 30 minutes of verbal and non-verbal time during this encounter date, time was needed to gather information, review chart, records, communicate/coordinate with staff remotely, as well as complete documentation. ? ? ?___________________________________________ ?Gwen Her. Dianah Field, M.D., ABFM., CAQSM. ?Primary Care and Sports Medicine ?[place] ? ?Adjunct Professor of Family Medicine  ?[employer] ?

## 2021-10-04 NOTE — Assessment & Plan Note (Signed)
Emily [name] is a pleasant 71 year old female, she is a Brewing technologist, she has history of knee osteoarthritis, we have tried multiple injections, activity modification, analgesics, we recently finished a series of Orthovisc back in March, only has about 30% relief, she is a candidate for knee arthroplasty now that she has limiting pain in spite of aggressive conservative treatment, referral to Dr. Berenice Primas for surgical opinion. ?

## 2021-10-11 DIAGNOSIS — M1712 Unilateral primary osteoarthritis, left knee: Secondary | ICD-10-CM | POA: Diagnosis not present

## 2021-10-11 DIAGNOSIS — M1711 Unilateral primary osteoarthritis, right knee: Secondary | ICD-10-CM | POA: Diagnosis not present

## 2021-10-14 ENCOUNTER — Encounter (HOSPITAL_BASED_OUTPATIENT_CLINIC_OR_DEPARTMENT_OTHER): Payer: Self-pay | Admitting: Nurse Practitioner

## 2021-10-14 ENCOUNTER — Ambulatory Visit (INDEPENDENT_AMBULATORY_CARE_PROVIDER_SITE_OTHER): Payer: PPO | Admitting: Nurse Practitioner

## 2021-10-14 ENCOUNTER — Other Ambulatory Visit (HOSPITAL_BASED_OUTPATIENT_CLINIC_OR_DEPARTMENT_OTHER): Payer: Self-pay

## 2021-10-14 VITALS — BP 128/88 | HR 86 | Ht 66.0 in | Wt 244.8 lb

## 2021-10-14 DIAGNOSIS — I10 Essential (primary) hypertension: Secondary | ICD-10-CM | POA: Diagnosis not present

## 2021-10-14 DIAGNOSIS — Z13820 Encounter for screening for osteoporosis: Secondary | ICD-10-CM

## 2021-10-14 DIAGNOSIS — H66003 Acute suppurative otitis media without spontaneous rupture of ear drum, bilateral: Secondary | ICD-10-CM | POA: Diagnosis not present

## 2021-10-14 DIAGNOSIS — Z0000 Encounter for general adult medical examination without abnormal findings: Secondary | ICD-10-CM

## 2021-10-14 DIAGNOSIS — R944 Abnormal results of kidney function studies: Secondary | ICD-10-CM | POA: Diagnosis not present

## 2021-10-14 DIAGNOSIS — K760 Fatty (change of) liver, not elsewhere classified: Secondary | ICD-10-CM

## 2021-10-14 DIAGNOSIS — Z23 Encounter for immunization: Secondary | ICD-10-CM

## 2021-10-14 DIAGNOSIS — R600 Localized edema: Secondary | ICD-10-CM | POA: Diagnosis not present

## 2021-10-14 MED ORDER — AZITHROMYCIN 250 MG PO TABS
ORAL_TABLET | ORAL | 0 refills | Status: AC
  Filled 2021-10-14: qty 6, 5d supply, fill #0

## 2021-10-14 MED ORDER — ZOSTER VAC RECOMB ADJUVANTED 50 MCG/0.5ML IM SUSR: 0.5000 mL | Freq: Once | INTRAMUSCULAR | 1 refills | Status: AC

## 2021-10-14 NOTE — Patient Instructions (Signed)
?MEDICARE ANNUAL WELLNESS VISIT ?Health Maintenance Summary and Written Plan of Care ? ?Ms. Emily [name] , ? ?Thank you for allowing me to perform your Medicare Annual Wellness Visit and for your ongoing commitment to your health.  ? ?Health Maintenance & Immunization History ?Health Maintenance  ?Topic Date Due  ? Zoster Vaccines- Shingrix (1 of 2) Never done  ? DEXA SCAN  Never done  ? COVID-19 Vaccine (4 - Booster for Pfizer series) 10/30/2021 (Originally 07/30/2020)  ? INFLUENZA VACCINE  01/17/2022  ? MAMMOGRAM  05/19/2023  ? COLONOSCOPY (Pts 45-69yr Insurance coverage will need to be confirmed)  04/13/2025  ? TETANUS/TDAP  02/25/2030  ? Pneumonia Vaccine 72 Years old  Completed  ? HPV VACCINES  Aged Out  ? Hepatitis C Screening  Discontinued  ? ?Immunization History  ?Administered Date(s) Administered  ? Fluad Quad(high Dose 65+) 05/08/2019, 02/26/2020  ? Influenza Split 02/17/2017  ? Influenza Whole 06/19/2004  ? Influenza, High Dose Seasonal PF 02/23/2016  ? Influenza,inj,Quad PF,6+ Mos 03/17/2014  ? PFIZER(Purple Top)SARS-COV-2 Vaccination 10/02/2019, 10/29/2019, 06/04/2020  ? Pneumococcal Conjugate-13 02/02/2016  ? Pneumococcal Polysaccharide-23 02/26/2020  ? Td 11/04/1997, 11/19/2009  ? Tdap 02/26/2020  ? ? ?These are the patient goals that we discussed: ? Goals Addressed   ? ?  ?  ?  ?  ? This Visit's Progress  ?  Overall Health     ?  Timeframe:  Long-Range Goal ?Priority:  High ?Start Date: 10/14/2021                            ?Expected End Date: 10/15/2022                 ? ?Follow Up Date 04/15/2022  ?  ?Eat healthier, be more active, focus more on mental health ?  ?  ?  ?  Pharmacy Care Plan   On track  ?  Current Barriers:  ?Chronic Disease Management support, education, and care coordination needs related to  Anxiety, Insomnia, Acid reflux, Gout, Fluid retention, Elevated eye pressure , Osteoarthritis  ? ?Pharmacist Clinical Goal(s):  ?Continue with diet modifications to decrease risk for gout  flare-ups. ?Minimize reflux symptoms  ?Continue to see improvement in anxiety symptoms. ?Continue to see improvement in insomnia.  ?Minimize gout flare-ups ? ?Interventions: ?Collaboration with provider re: medication management ?Comprehensive medication review performed. ?Discussed non-pharmacological interventions for acid reflux. Take measures to prevent acid reflux, such as avoiding spicy foods, avoiding caffeine, avoid laying down a few hours after eating, and raising the head of the bed.  ?Discussed eating a low-purine diet to decrease risk of gout flare-ups.. Avoid foods and drinks such as: liver, kidney, anchovies, asparagus, herring, mushrooms, mussels, beer,etc. ?Discussed non-pharmacological interventions for insomnia. (Avoid napping, limit exposure to technology near bedtime, etc). ?Discussed importance of taking medications as directed.  ?Refilled furosemide '20mg'$  and colchicine 0.'6mg'$ .  ? ?Patient Self Care Activities:  ?Calls provider office for new concerns or questions.  ?Continue current medications as directed by providers.  ?Continue following up with specialists. ? ?Initial goal documentation ? ?  ? ?  ?  ? ?This is a list of Health Maintenance Items that are overdue or due now: ?Health Maintenance Due  ?Topic Date Due  ? Zoster Vaccines- Shingrix (1 of 2) Never done  ? DEXA SCAN  Never done  ?  ? ?Orders/Referrals Placed Today: ?Orders Placed This Encounter  ?Procedures  ? DG Bone Density  ?  Standing  Status:   Future  ?  Standing Expiration Date:   10/15/2022  ?  Scheduling Instructions:  ?   Please call patient to schedule  ?  Order Specific Question:   Reason for Exam (SYMPTOM  OR DIAGNOSIS REQUIRED)  ?  Answer:   screening bone density  ?  Order Specific Question:   Preferred imaging location?  ?  Answer:   [facility]  ?  Order Specific Question:   Release to patient  ?  Answer:   Immediate  ? ?(Contact our referral department at [phone] if you have not spoken with someone about  your referral appointment within the next 5 days)  ? ? ?Follow-up Plan ?6 months ? ? ?Worthy Keeler, DNP, AGNP-c ? ? ?

## 2021-10-15 LAB — CBC WITH DIFFERENTIAL/PLATELET
Basophils Absolute: 0 10*3/uL (ref 0.0–0.2)
Basos: 0 %
EOS (ABSOLUTE): 0 10*3/uL (ref 0.0–0.4)
Eos: 0 %
Hematocrit: 42.8 % (ref 34.0–46.6)
Hemoglobin: 14.6 g/dL (ref 11.1–15.9)
Immature Grans (Abs): 0 10*3/uL (ref 0.0–0.1)
Immature Granulocytes: 1 %
Lymphocytes Absolute: 1.8 10*3/uL (ref 0.7–3.1)
Lymphs: 27 %
MCH: 31.1 pg (ref 26.6–33.0)
MCHC: 34.1 g/dL (ref 31.5–35.7)
MCV: 91 fL (ref 79–97)
Monocytes Absolute: 0.7 10*3/uL (ref 0.1–0.9)
Monocytes: 11 %
Neutrophils Absolute: 4.2 10*3/uL (ref 1.4–7.0)
Neutrophils: 61 %
Platelets: 251 10*3/uL (ref 150–450)
RBC: 4.69 x10E6/uL (ref 3.77–5.28)
RDW: 12.7 % (ref 11.7–15.4)
WBC: 6.8 10*3/uL (ref 3.4–10.8)

## 2021-10-15 LAB — COMPREHENSIVE METABOLIC PANEL
ALT: 34 IU/L — ABNORMAL HIGH (ref 0–32)
AST: 21 IU/L (ref 0–40)
Albumin/Globulin Ratio: 2.2 (ref 1.2–2.2)
Albumin: 4.4 g/dL (ref 3.7–4.7)
Alkaline Phosphatase: 56 IU/L (ref 44–121)
BUN/Creatinine Ratio: 19 (ref 12–28)
BUN: 21 mg/dL (ref 8–27)
Bilirubin Total: 0.4 mg/dL (ref 0.0–1.2)
CO2: 27 mmol/L (ref 20–29)
Calcium: 9.6 mg/dL (ref 8.7–10.3)
Chloride: 97 mmol/L (ref 96–106)
Creatinine, Ser: 1.11 mg/dL — ABNORMAL HIGH (ref 0.57–1.00)
Globulin, Total: 2 g/dL (ref 1.5–4.5)
Glucose: 85 mg/dL (ref 70–99)
Potassium: 3.8 mmol/L (ref 3.5–5.2)
Sodium: 140 mmol/L (ref 134–144)
Total Protein: 6.4 g/dL (ref 6.0–8.5)
eGFR: 53 mL/min/{1.73_m2} — ABNORMAL LOW (ref >59)

## 2021-10-15 LAB — LIPID PANEL
Chol/HDL Ratio: 3.1 ratio (ref 0.0–4.4)
Cholesterol, Total: 181 mg/dL (ref 100–199)
HDL: 59 mg/dL (ref >39)
LDL Chol Calc (NIH): 100 mg/dL — ABNORMAL HIGH (ref 0–99)
Triglycerides: 127 mg/dL (ref 0–149)
VLDL Cholesterol Cal: 22 mg/dL (ref 5–40)

## 2021-10-18 ENCOUNTER — Encounter (HOSPITAL_BASED_OUTPATIENT_CLINIC_OR_DEPARTMENT_OTHER): Payer: Self-pay | Admitting: Nurse Practitioner

## 2021-10-18 ENCOUNTER — Other Ambulatory Visit (HOSPITAL_BASED_OUTPATIENT_CLINIC_OR_DEPARTMENT_OTHER): Payer: Self-pay

## 2021-10-18 ENCOUNTER — Ambulatory Visit (INDEPENDENT_AMBULATORY_CARE_PROVIDER_SITE_OTHER): Payer: PPO | Admitting: Nurse Practitioner

## 2021-10-18 VITALS — BP 112/72 | HR 96 | Ht 66.0 in | Wt 240.0 lb

## 2021-10-18 DIAGNOSIS — Z01818 Encounter for other preprocedural examination: Secondary | ICD-10-CM | POA: Insufficient documentation

## 2021-10-18 DIAGNOSIS — I10 Essential (primary) hypertension: Secondary | ICD-10-CM | POA: Diagnosis not present

## 2021-10-18 DIAGNOSIS — M25562 Pain in left knee: Secondary | ICD-10-CM

## 2021-10-18 DIAGNOSIS — Z0000 Encounter for general adult medical examination without abnormal findings: Secondary | ICD-10-CM | POA: Insufficient documentation

## 2021-10-18 DIAGNOSIS — K760 Fatty (change of) liver, not elsewhere classified: Secondary | ICD-10-CM | POA: Diagnosis not present

## 2021-10-18 DIAGNOSIS — R944 Abnormal results of kidney function studies: Secondary | ICD-10-CM | POA: Diagnosis not present

## 2021-10-18 DIAGNOSIS — G8929 Other chronic pain: Secondary | ICD-10-CM | POA: Diagnosis not present

## 2021-10-18 HISTORY — DX: Encounter for other preprocedural examination: Z01.818

## 2021-10-18 NOTE — Assessment & Plan Note (Signed)
Chronic. Surgical intervention pending with [facility]. Review today for surgical clearance.  ?

## 2021-10-18 NOTE — Assessment & Plan Note (Addendum)
Chronic. Slight elevation seen in ALT on recent labs, but not concerning at this time.  ?Will plan to repeat labs today to monitor.  ?

## 2021-10-18 NOTE — Assessment & Plan Note (Addendum)
Mildly decreased eGFR on recent labs. This is a new finding and etiology is unclear at this time. Consider possible increased NSAID use or dehydration contributing to this finding. No alarm sx present at this time.  ?We will plan a urine microalbumin and UA/Culture today for monitoring and repeat kidney function today. ?Recommend avoid NSAIDs and nephrotoxic medications and increase hydration. ?If repeat findings show no improvement, consider evaluation with nephrology prior to surgery to ensure that kidney clearance is appropriate for anesthesia and surgery.  ?

## 2021-10-18 NOTE — Progress Notes (Signed)
? ?Established Patient Office Visit ? ?Subjective   ?Patient ID: Emily [name], female    DOB: 25-Nov-1950  Age: 71 y.o. MRN: [id number] ? ?No chief complaint on file. ? ? ?HPI ? ?Emily [name] presents today for surgical clearance evaluation for upcoming left knee arthoplasty. ?Labs completed at her most recent visit.  ?We will obtain EKG today.  ?Patient presents with no concerns today.   ? ?Review of Systems  ?All other systems reviewed and are negative. ? ?  ?Objective:  ?  ? ?BP 112/72   Pulse 96   Ht $R'5\' 6"'IR$  (1.676 m)   Wt 240 lb (108.9 kg)   SpO2 99%   BMI 38.74 kg/m?  ?BP Readings from Last 3 Encounters:  ?10/18/21 112/72  ?10/14/21 128/88  ?08/18/21 132/85  ? ?Wt Readings from Last 3 Encounters:  ?10/18/21 240 lb (108.9 kg)  ?10/14/21 244 lb 12.8 oz (111 kg)  ?04/26/21 240 lb (108.9 kg)  ? ? ?Physical Exam ?Vitals and nursing note reviewed.  ?Constitutional:   ?   Appearance: Normal appearance.  ?HENT:  ?   Head: Normocephalic.  ?Eyes:  ?   Extraocular Movements: Extraocular movements intact.  ?   Conjunctiva/sclera: Conjunctivae normal.  ?   Pupils: Pupils are equal, round, and reactive to light.  ?Cardiovascular:  ?   Rate and Rhythm: Normal rate and regular rhythm.  ?   Pulses: Normal pulses.  ?   Heart sounds: Normal heart sounds.  ?Pulmonary:  ?   Effort: Pulmonary effort is normal.  ?   Breath sounds: Normal breath sounds.  ?Abdominal:  ?   General: Bowel sounds are normal.  ?   Palpations: Abdomen is soft.  ?Musculoskeletal:     ?   General: Swelling, tenderness and signs of injury present.  ?   Comments: Left knee pain with decreased ROM and tenderness to the joint. Edema is present. Brace intact.   ?Skin: ?   General: Skin is warm and dry.  ?   Capillary Refill: Capillary refill takes less than 2 seconds.  ?Neurological:  ?   General: No focal deficit present.  ?   Mental Status: She is alert and oriented to person, place, and time.  ?Psychiatric:     ?   Mood and Affect: Mood normal.     ?    Behavior: Behavior normal.     ?   Thought Content: Thought content normal.     ?   Judgment: Judgment normal.  ? ? ? ?Results for orders placed or performed in visit on 10/18/21  ?Urine Culture  ? Specimen: Urine  ? UR  ?Result Value Ref Range  ? Urine Culture, Routine Final report   ? Organism ID, Bacteria No growth   ?Comprehensive metabolic panel  ?Result Value Ref Range  ? Glucose 98 70 - 99 mg/dL  ? BUN 20 8 - 27 mg/dL  ? Creatinine, Ser 1.09 (H) 0.57 - 1.00 mg/dL  ? eGFR 54 (L) >59 mL/min/1.73  ? BUN/Creatinine Ratio 18 12 - 28  ? Sodium 139 134 - 144 mmol/L  ? Potassium 4.4 3.5 - 5.2 mmol/L  ? Chloride 97 96 - 106 mmol/L  ? CO2 28 20 - 29 mmol/L  ? Calcium 10.1 8.7 - 10.3 mg/dL  ? Total Protein 6.7 6.0 - 8.5 g/dL  ? Albumin 4.3 3.7 - 4.7 g/dL  ? Globulin, Total 2.4 1.5 - 4.5 g/dL  ? Albumin/Globulin Ratio 1.8 1.2 - 2.2  ? Bilirubin  Total 0.4 0.0 - 1.2 mg/dL  ? Alkaline Phosphatase 57 44 - 121 IU/L  ? AST 16 0 - 40 IU/L  ? ALT 27 0 - 32 IU/L  ?Urine Microalbumin w/creat. ratio  ?Result Value Ref Range  ? Creatinine, Urine 164.3 Not Estab. mg/dL  ? Microalbumin, Urine 24.8 Not Estab. ug/mL  ? Microalb/Creat Ratio 15 0 - 29 mg/g creat  ? ? ?Last CBC ?Lab Results  ?Component Value Date  ? WBC 6.8 10/14/2021  ? HGB 14.6 10/14/2021  ? HCT 42.8 10/14/2021  ? MCV 91 10/14/2021  ? MCH 31.1 10/14/2021  ? RDW 12.7 10/14/2021  ? PLT 251 10/14/2021  ? ?Last metabolic panel ?Lab Results  ?Component Value Date  ? GLUCOSE 98 10/18/2021  ? NA 139 10/18/2021  ? K 4.4 10/18/2021  ? CL 97 10/18/2021  ? CO2 28 10/18/2021  ? BUN 20 10/18/2021  ? CREATININE 1.09 (H) 10/18/2021  ? EGFR 54 (L) 10/18/2021  ? CALCIUM 10.1 10/18/2021  ? PROT 6.7 10/18/2021  ? ALBUMIN 4.3 10/18/2021  ? LABGLOB 2.4 10/18/2021  ? AGRATIO 1.8 10/18/2021  ? BILITOT 0.4 10/18/2021  ? ALKPHOS 57 10/18/2021  ? AST 16 10/18/2021  ? ALT 27 10/18/2021  ? ANIONGAP 10 03/15/2021  ? ?Last lipids ?Lab Results  ?Component Value Date  ? CHOL 181 10/14/2021  ? HDL 59  10/14/2021  ? LDLCALC 100 (H) 10/14/2021  ? TRIG 127 10/14/2021  ? CHOLHDL 3.1 10/14/2021  ? ? ?The 10-year ASCVD risk score (Arnett DK, et al., 2019) is: 10.5% ? ?  ?Assessment & Plan:  ? ?Problem List Items Addressed This Visit   ? ? Hepatic steatosis  ?  Chronic. Slight elevation seen in ALT on recent labs, but not concerning at this time.  ?Will plan to repeat labs today to monitor.  ? ?  ?  ? Relevant Orders  ? Comprehensive metabolic panel (Completed)  ? EKG 12-Lead  ? Hypertension  ?  Chronic. Well controlled at this time. No arrhythmia or alarm symptoms present today. EKG normal with no concerning findings.  ?Repeat CMP today to monitor kidney function. Mildly decreased GFR on recent labs. Patient is well hydrated at this time.  ? ?  ?  ? Relevant Orders  ? Comprehensive metabolic panel (Completed)  ? POCT UA - Microalbumin  ? POCT URINALYSIS DIP (CLINITEK)  ? EKG 12-Lead  ? Preoperative evaluation of a medical condition to rule out surgical contraindications (TAR required) - Primary  ?  Labs and EKG reviewed today. ?EKG WNL ?Labs do show the presence of mildly decreased kidney function- this is new. We will plan to repeat these labs at the end of the week to ensure that the levels have returned to normal.  ?ALT slightly elevated, but consistent with hx of of fatty liver which is known to patient.  ?No indication for A1c today. ?Will obtain urine micro and UA today given the recent decreased eGFR.  ?No pulmonary or cardiac concerns to limit ability to proceed with planned surgery. Recommend aggressive pulmonary toileting post operatively as she is susceptible to URI and bronchitis.  ?At this time, we will plan to approve surgery pending further monitoring of kidney function.  ? ?  ?  ? Relevant Orders  ? Comprehensive metabolic panel (Completed)  ? POCT UA - Microalbumin  ? POCT URINALYSIS DIP (CLINITEK)  ? EKG 12-Lead  ? Chronic pain of left knee  ?  Chronic. Surgical intervention pending with [facility]. Review today for surgical clearance.  ? ?  ?  ? Relevant Orders  ? Comprehensive metabolic panel (Completed)  ? Decreased calculated glomerular filtration rate (GFR)  ?  Mildly decreased eGFR on recent labs. This is a new finding and etiology is unclear at this time. Consider possible increased NSAID use or dehydration contributing to this finding. No alarm sx present at this time.  ?We will plan a urine microalbumin and UA/Culture today for monitoring and repeat kidney function today. ?Recommend avoid NSAIDs and nephrotoxic medications and increase hydration. ?If repeat findings show no improvement, consider evaluation with nephrology prior to surgery to ensure that kidney clearance is appropriate for anesthesia and surgery.  ? ?  ?  ? Relevant Orders  ? Comprehensive metabolic panel (Completed)  ? POCT UA - Microalbumin  ? POCT URINALYSIS DIP (CLINITEK)  ? Urine Culture (Completed)  ? Urine Microalbumin w/creat. ratio (Completed)  ? ? ?Return if symptoms worsen or fail to improve.  ? ? ?Orma Render, NP ? ?

## 2021-10-18 NOTE — Assessment & Plan Note (Addendum)
Chronic. Well controlled at this time. No arrhythmia or alarm symptoms present today. EKG normal with no concerning findings.  ?Repeat CMP today to monitor kidney function. Mildly decreased GFR on recent labs. Patient is well hydrated at this time.  ?

## 2021-10-18 NOTE — Assessment & Plan Note (Addendum)
Labs and EKG reviewed today. ?EKG WNL ?Labs do show the presence of mildly decreased kidney function- this is new. We will plan to repeat these labs at the end of the week to ensure that the levels have returned to normal.  ?ALT slightly elevated, but consistent with hx of of fatty liver which is known to patient.  ?No indication for A1c today. ?Will obtain urine micro and UA today given the recent decreased eGFR.  ?No pulmonary or cardiac concerns to limit ability to proceed with planned surgery. Recommend aggressive pulmonary toileting post operatively as she is susceptible to URI and bronchitis.  ?At this time, we will plan to approve surgery pending further monitoring of kidney function.  ?

## 2021-10-18 NOTE — Patient Instructions (Addendum)
I would like to recheck your kidney function just to make sure everything looks ok.  ?I will let you know if the results are concerning. Your urine looked OK as far as kidney function goes.  ?Your urine did show a bit of bacteria, but most of the time this is from the skin. We will culture this to make sure that this is not a sign of infection, but the rest of the urine looked ok, so this is unlikely.  ? ?I will fax the form over once I have the labs back.  ?

## 2021-10-19 ENCOUNTER — Telehealth (HOSPITAL_BASED_OUTPATIENT_CLINIC_OR_DEPARTMENT_OTHER): Payer: Self-pay

## 2021-10-19 DIAGNOSIS — R944 Abnormal results of kidney function studies: Secondary | ICD-10-CM | POA: Diagnosis not present

## 2021-10-19 LAB — COMPREHENSIVE METABOLIC PANEL
ALT: 27 IU/L (ref 0–32)
AST: 16 IU/L (ref 0–40)
Albumin/Globulin Ratio: 1.8 (ref 1.2–2.2)
Albumin: 4.3 g/dL (ref 3.7–4.7)
Alkaline Phosphatase: 57 IU/L (ref 44–121)
BUN/Creatinine Ratio: 18 (ref 12–28)
BUN: 20 mg/dL (ref 8–27)
Bilirubin Total: 0.4 mg/dL (ref 0.0–1.2)
CO2: 28 mmol/L (ref 20–29)
Calcium: 10.1 mg/dL (ref 8.7–10.3)
Chloride: 97 mmol/L (ref 96–106)
Creatinine, Ser: 1.09 mg/dL — ABNORMAL HIGH (ref 0.57–1.00)
Globulin, Total: 2.4 g/dL (ref 1.5–4.5)
Glucose: 98 mg/dL (ref 70–99)
Potassium: 4.4 mmol/L (ref 3.5–5.2)
Sodium: 139 mmol/L (ref 134–144)
Total Protein: 6.7 g/dL (ref 6.0–8.5)
eGFR: 54 mL/min/{1.73_m2} — ABNORMAL LOW (ref >59)

## 2021-10-19 NOTE — Telephone Encounter (Signed)
Called and spoke with patient to let her know we needed a urine sample for her labs.  ?

## 2021-10-20 ENCOUNTER — Encounter (HOSPITAL_BASED_OUTPATIENT_CLINIC_OR_DEPARTMENT_OTHER): Payer: Self-pay

## 2021-10-20 ENCOUNTER — Ambulatory Visit (HOSPITAL_BASED_OUTPATIENT_CLINIC_OR_DEPARTMENT_OTHER): Payer: PPO

## 2021-10-21 ENCOUNTER — Encounter (HOSPITAL_BASED_OUTPATIENT_CLINIC_OR_DEPARTMENT_OTHER): Payer: Self-pay | Admitting: Nurse Practitioner

## 2021-10-21 ENCOUNTER — Ambulatory Visit (HOSPITAL_BASED_OUTPATIENT_CLINIC_OR_DEPARTMENT_OTHER): Payer: PPO

## 2021-10-21 DIAGNOSIS — R944 Abnormal results of kidney function studies: Secondary | ICD-10-CM | POA: Diagnosis not present

## 2021-10-21 LAB — URINE CULTURE: Organism ID, Bacteria: NO GROWTH

## 2021-10-22 LAB — MICROALBUMIN / CREATININE URINE RATIO
Creatinine, Urine: 164.3 mg/dL
Microalb/Creat Ratio: 15 mg/g creat (ref 0–29)
Microalbumin, Urine: 24.8 ug/mL

## 2021-10-23 ENCOUNTER — Encounter (HOSPITAL_BASED_OUTPATIENT_CLINIC_OR_DEPARTMENT_OTHER): Payer: Self-pay | Admitting: Nurse Practitioner

## 2021-10-23 NOTE — Progress Notes (Signed)
? ?Subjective:  ? Emily [name] is a 71 y.o. female who presents for Medicare Annual (Subsequent) preventive examination. ? ?Review of Systems    ?Left knee pain ?All other ROS negative ?  ? ?   ?Objective:  ?  ?Today's Vitals  ? 10/14/21 0901 10/14/21 0920  ?BP: 128/88   ?Pulse: 86   ?SpO2: 96%   ?Weight: 244 lb 12.8 oz (111 kg)   ?Height: '5\' 6"'$  (1.676 m)   ?PainSc:  6   ? ?Body mass index is 39.51 kg/m?. ? ? ?  10/14/2021  ?  9:41 AM 03/15/2021  ?  6:42 PM 10/08/2019  ? 11:08 AM 07/26/2018  ?  7:33 PM 07/17/2018  ?  7:03 PM 06/12/2017  ? 11:39 PM 01/29/2016  ?  8:31 PM  ?Advanced Directives  ?Does Patient Have a Medical Advance Directive? Yes Yes Yes Yes No No No  ?Type of Paramedic of [place];Living will [place];Living will [place];Living will [place];Living will     ?Does patient want to make changes to medical advance directive? No - Patient declined        ?Copy of [place] in Chart? No - copy requested        ?Would patient like information on creating a medical advance directive?      No - Patient declined No - patient declined information  ? ? ?Current Medications (verified) ?Outpatient Encounter Medications as of 10/14/2021  ?Medication Sig  ? ALPRAZolam (XANAX) 1 MG tablet Take 1 mg by mouth 3 (three) times daily as needed. For anxiety.  ? azelastine (OPTIVAR) 0.05 % ophthalmic solution Place 2 drops into both eyes 2 (two) times daily.  ? [EXPIRED] azithromycin (ZITHROMAX) 250 MG tablet Take 2 tablets by mouth on day 1, then 1 tablet daily on days 2 through 5  ? Blood Pressure Monitor MISC Use as directed  ? buPROPion (WELLBUTRIN XL) 150 MG 24 hr tablet Take 2 tablets (300 mg total) by mouth every morning.  ? chlorthalidone (HYGROTON) 25 MG tablet Take 1 tablet (25 mg total) by mouth daily.  ? cycloSPORINE (RESTASIS) 0.05 % ophthalmic emulsion Instill 1 drop into both eyes twice daily.  ?  desvenlafaxine (PRISTIQ) 100 MG 24 hr tablet Take 1 tablet (100 mg total) by mouth every morning.  ? ergocalciferol (VITAMIN D2) 1.25 MG (50000 UT) capsule Take 1 capsule by mouth once a week.  ? ROCKLATAN 0.02-0.005 % SOLN Apply to eye.  ? traMADol (ULTRAM) 50 MG tablet Take 1 tablet (50 mg total) by mouth every 8 (eight) hours as needed for moderate pain.  ? traZODone (DESYREL) 100 MG tablet Take 300 mg by mouth at bedtime.  ? triamcinolone ointment (KENALOG) 0.1 % PLEASE SEE ATTACHED FOR DETAILED DIRECTIONS  ? [EXPIRED] Zoster Vaccine Adjuvanted [facility]) injection Inject 0.5 mLs into the muscle once for 1 dose. Repeat in 2-6 months. Please fax confirmation of vaccination to Worthy Keeler, DNP at [phone]  ? ?No facility-administered encounter medications on file as of 10/14/2021.  ? ? ?Allergies (verified) ?Doxycycline, Penicillins, and Prednisone  ? ?History: ?Past Medical History:  ?Diagnosis Date  ? Acute bacterial sinusitis 06/29/2021  ? Allergy   ? seasonal   ? Anxiety   ? ARTHRALGIA 11/10/2009  ? Qualifier: Diagnosis of  By: Elease Hashimoto MD, Bruce    ? Cancer [facility]) 2009  ? squamous cell skin  ? Cataract   ? bilateral surgery, left  unsuccessful  ? Cystitis 02/12/2020  ? Depression   ? DYSPNEA 11/10/2009  ? Qualifier: Diagnosis of  By: Elease Hashimoto MD, Bruce    ? Eating disorder   ? GERD (gastroesophageal reflux disease)   ? Glaucoma   ? Head injury 07/23/2018  ? Hiatal hernia   ? History of cardiovascular stress test 04/2000  ? cardiolyte  ? History of flexible sigmoidoscopy 09/21/1999  ? Other fatigue 02/26/2020  ? Sinobronchitis 04/05/2021  ? WEIGHT GAIN 11/10/2009  ? Qualifier: Diagnosis of  By: Elease Hashimoto MD, Bruce    ? ?Past Surgical History:  ?Procedure Laterality Date  ? ABDOMINAL HYSTERECTOMY    ? APPENDECTOMY    ? CARDIAC CATHETERIZATION  04/2000  ? CARDIAC CATHETERIZATION    ? COLONOSCOPY  12/31/09  ? UPPER GASTROINTESTINAL ENDOSCOPY  09/15/1999  ? WISDOM TOOTH EXTRACTION    ? ?Family History  ?Problem  Relation Age of Onset  ? Arthritis Other   ? Cancer Other   ?     lung  ? Coronary artery disease Other   ? Stroke Other   ? Colon cancer Neg Hx   ? Colon polyps Neg Hx   ? Esophageal cancer Neg Hx   ? Rectal cancer Neg Hx   ? Stomach cancer Neg Hx   ? ?Social History  ? ?Socioeconomic History  ? Marital status: Married  ?  Spouse name: Mikki Santee  ? Number of children: Not on file  ? Years of education: 68  ? Highest education level: Bachelor's degree (e.g., BA, AB, BS)  ?Occupational History  ? Not on file  ?Tobacco Use  ? Smoking status: Former  ?  Packs/day: 1.00  ?  Years: 10.00  ?  Pack years: 10.00  ?  Types: Cigarettes  ?  Quit date: 05/27/2008  ?  Years since quitting: 13.4  ?  Passive exposure: Never  ? Smokeless tobacco: Never  ?Vaping Use  ? Vaping Use: Never used  ?Substance and Sexual Activity  ? Alcohol use: Yes  ?  Comment: occ  ? Drug use: No  ? Sexual activity: Yes  ?  Partners: Male  ?Other Topics Concern  ? Not on file  ?Social History Narrative  ? Not on file  ? ?Social Determinants of Health  ? ?Financial Resource Strain: Low Risk   ? Difficulty of Paying Living Expenses: Not hard at all  ?Food Insecurity: No Food Insecurity  ? Worried About Charity fundraiser in the Last Year: Never true  ? Ran Out of Food in the Last Year: Never true  ?Transportation Needs: No Transportation Needs  ? Lack of Transportation (Medical): No  ? Lack of Transportation (Non-Medical): No  ?Physical Activity: Sufficiently Active  ? Days of Exercise per Week: 5 days  ? Minutes of Exercise per Session: 30 min  ?Stress: Stress Concern Present  ? Feeling of Stress : Very much  ?Social Connections: Moderately Isolated  ? Frequency of Communication with Friends and Family: Once a week  ? Frequency of Social Gatherings with Friends and Family: Once a week  ? Attends Religious Services: Never  ? Active Member of Clubs or Organizations: Yes  ? Attends Archivist Meetings: Never  ? Marital Status: Married  ? ? ?Tobacco  Counseling ?Counseling given: No ? ? ?Clinical Intake: ? ?Pre-visit preparation completed: No ? ?Pain : 0-10 ?Pain Score: 6  ?Pain Type: Other (Comment) (knee pain- left) ?Pain Location: Knee ?Pain Orientation: Left ?Pain Descriptors / Indicators: Dull, Penetrating ?Pain  Onset: More than a month ago ?Pain Frequency: Constant ?Pain Relieving Factors: pain medication, rest ?Effect of Pain on Daily Activities: limits sleep due to pain and mobility ? ?Pain Relieving Factors: pain medication, rest ? ?BMI - recorded: 39.51 ?Nutritional Status: BMI > 30  Obese ?Nutritional Risks: None ?Diabetes: No ? ?How often do you need to have someone help you when you read instructions, pamphlets, or other written materials from your doctor or pharmacy?: 1 - Never ?What is the last grade level you completed in school?: masters degree ? ?Diabetic?no ? ?Interpreter Needed?: No ? ?Information entered by :: S. Gwin Eagon ? ? ?Activities of Daily Living ? ?  10/14/2021  ?  8:59 AM  ?In your present state of health, do you have any difficulty performing the following activities:  ?Hearing? 0  ?Vision? 0  ?Difficulty concentrating or making decisions? 0  ?Walking or climbing stairs? 0  ?Dressing or bathing? 0  ?Doing errands, shopping? 0  ? ? ?Patient Care Team: ?Memorie Yokoyama, Coralee Pesa, NP as PCP - General (Nurse Practitioner) ?Earnie Larsson, [facility] as Pharmacist (Pharmacist) ?Maryjane Hurter, MD as Consulting Physician (Pulmonary Disease) ?Jola Schmidt, MD as Consulting Physician (Ophthalmology) ?Ashley Royalty, [place] (Physician Assistant) ? ?Indicate any recent Medical Services you may have received from other than [facility] providers in the past year (date may be approximate). ? ?   ?Assessment:  ? This is a routine wellness examination for [place]. ? ?Hearing/Vision screen ?No results found. ? ?Dietary issues and exercise activities discussed: Recommend at least 10 minutes walking daily with goal to increase to 150 minutes per week.  ? ?  ? ? Goals  Addressed   ? ?  ?  ?  ?  ? This Visit's Progress  ?  Overall Health     ?  Timeframe:  Long-Range Goal ?Priority:  High ?Start Date: 10/14/2021                            ?Expected End Date: 10/15/2022

## 2021-10-27 ENCOUNTER — Other Ambulatory Visit (HOSPITAL_BASED_OUTPATIENT_CLINIC_OR_DEPARTMENT_OTHER): Payer: Self-pay

## 2021-10-27 MED ORDER — VITAMIN D3 1.25 MG (50000 UT) PO CAPS
50000.0000 [IU] | ORAL_CAPSULE | ORAL | 12 refills | Status: AC
  Filled 2021-10-27: qty 52, 365d supply, fill #0
  Filled 2022-09-12: qty 2, 14d supply, fill #0
  Filled 2022-09-12: qty 4, 28d supply, fill #0

## 2021-10-27 MED ORDER — DESVENLAFAXINE SUCCINATE ER 100 MG PO TB24
ORAL_TABLET | ORAL | 4 refills | Status: DC
  Filled 2021-10-27: qty 90, 90d supply, fill #0

## 2021-10-27 MED ORDER — ALPRAZOLAM 1 MG PO TABS
ORAL_TABLET | ORAL | 0 refills | Status: DC
Start: 2021-10-27 — End: 2022-07-04
  Filled 2021-10-27 – 2022-03-13 (×2): qty 45, 90d supply, fill #0

## 2021-10-27 MED ORDER — BUPROPION HCL ER (XL) 150 MG PO TB24
ORAL_TABLET | ORAL | 4 refills | Status: DC
  Filled 2021-10-27 – 2022-08-11 (×3): qty 90, 90d supply, fill #0

## 2021-10-31 DIAGNOSIS — M1712 Unilateral primary osteoarthritis, left knee: Secondary | ICD-10-CM | POA: Diagnosis not present

## 2021-11-02 ENCOUNTER — Other Ambulatory Visit (HOSPITAL_BASED_OUTPATIENT_CLINIC_OR_DEPARTMENT_OTHER): Payer: Self-pay

## 2021-11-02 DIAGNOSIS — M1712 Unilateral primary osteoarthritis, left knee: Secondary | ICD-10-CM | POA: Diagnosis not present

## 2021-11-02 MED ORDER — ASPIRIN 81 MG PO TBEC
DELAYED_RELEASE_TABLET | ORAL | 0 refills | Status: DC
  Filled 2021-11-02: qty 60, 28d supply, fill #0

## 2021-11-02 MED ORDER — TIZANIDINE HCL 2 MG PO TABS
ORAL_TABLET | ORAL | 0 refills | Status: DC
  Filled 2021-11-02: qty 60, 15d supply, fill #0

## 2021-11-02 MED ORDER — OXYCODONE HCL 5 MG PO TABS
ORAL_TABLET | ORAL | 0 refills | Status: DC
  Filled 2021-11-02: qty 30, 5d supply, fill #0

## 2021-11-02 MED ORDER — ONDANSETRON HCL 4 MG PO TABS
ORAL_TABLET | ORAL | 0 refills | Status: DC
  Filled 2021-11-02: qty 30, 8d supply, fill #0

## 2021-11-03 ENCOUNTER — Other Ambulatory Visit (HOSPITAL_BASED_OUTPATIENT_CLINIC_OR_DEPARTMENT_OTHER): Payer: Self-pay

## 2021-11-10 DIAGNOSIS — G8918 Other acute postprocedural pain: Secondary | ICD-10-CM | POA: Diagnosis not present

## 2021-11-10 DIAGNOSIS — Z96652 Presence of left artificial knee joint: Secondary | ICD-10-CM | POA: Diagnosis not present

## 2021-11-10 DIAGNOSIS — M21162 Varus deformity, not elsewhere classified, left knee: Secondary | ICD-10-CM | POA: Diagnosis not present

## 2021-11-10 DIAGNOSIS — M1712 Unilateral primary osteoarthritis, left knee: Secondary | ICD-10-CM | POA: Diagnosis not present

## 2021-11-15 ENCOUNTER — Other Ambulatory Visit (HOSPITAL_BASED_OUTPATIENT_CLINIC_OR_DEPARTMENT_OTHER): Payer: Self-pay

## 2021-11-15 MED ORDER — OXYCODONE HCL 5 MG PO TABS
ORAL_TABLET | ORAL | 0 refills | Status: DC
  Filled 2021-11-15: qty 30, 5d supply, fill #0

## 2021-11-16 DIAGNOSIS — R262 Difficulty in walking, not elsewhere classified: Secondary | ICD-10-CM | POA: Diagnosis not present

## 2021-11-16 DIAGNOSIS — Z96652 Presence of left artificial knee joint: Secondary | ICD-10-CM | POA: Diagnosis not present

## 2021-11-17 ENCOUNTER — Other Ambulatory Visit: Payer: PPO

## 2021-11-18 ENCOUNTER — Telehealth (HOSPITAL_BASED_OUTPATIENT_CLINIC_OR_DEPARTMENT_OTHER): Payer: Self-pay | Admitting: Nurse Practitioner

## 2021-11-18 ENCOUNTER — Other Ambulatory Visit (HOSPITAL_BASED_OUTPATIENT_CLINIC_OR_DEPARTMENT_OTHER): Payer: Self-pay

## 2021-11-18 MED ORDER — OXYCODONE HCL 5 MG PO TABS
ORAL_TABLET | ORAL | 0 refills | Status: DC
  Filled 2021-11-18: qty 30, 6d supply, fill #0
  Filled 2021-11-18: qty 30, 5d supply, fill #0

## 2021-11-18 NOTE — Telephone Encounter (Signed)
Pt called regarding order put in for bone density. She stated she tried canceling many times threw mychart and she doesn't want to resch until she is recovered from her knee surgery. She asked if the office could be contact to let them know she will be in contact with them to resch this. She was very displeased with how they handled everything. I let pt know if we can we will. Please advise.

## 2021-11-22 DIAGNOSIS — Z96652 Presence of left artificial knee joint: Secondary | ICD-10-CM | POA: Diagnosis not present

## 2021-11-22 DIAGNOSIS — M5451 Vertebrogenic low back pain: Secondary | ICD-10-CM | POA: Diagnosis not present

## 2021-11-22 DIAGNOSIS — R262 Difficulty in walking, not elsewhere classified: Secondary | ICD-10-CM | POA: Diagnosis not present

## 2021-11-23 ENCOUNTER — Other Ambulatory Visit (HOSPITAL_BASED_OUTPATIENT_CLINIC_OR_DEPARTMENT_OTHER): Payer: Self-pay

## 2021-11-24 ENCOUNTER — Other Ambulatory Visit (HOSPITAL_BASED_OUTPATIENT_CLINIC_OR_DEPARTMENT_OTHER): Payer: Self-pay

## 2021-11-29 DIAGNOSIS — R262 Difficulty in walking, not elsewhere classified: Secondary | ICD-10-CM | POA: Diagnosis not present

## 2021-11-29 DIAGNOSIS — Z96652 Presence of left artificial knee joint: Secondary | ICD-10-CM | POA: Diagnosis not present

## 2021-12-01 ENCOUNTER — Other Ambulatory Visit (HOSPITAL_BASED_OUTPATIENT_CLINIC_OR_DEPARTMENT_OTHER): Payer: Self-pay

## 2021-12-01 DIAGNOSIS — R262 Difficulty in walking, not elsewhere classified: Secondary | ICD-10-CM | POA: Diagnosis not present

## 2021-12-01 DIAGNOSIS — Z96652 Presence of left artificial knee joint: Secondary | ICD-10-CM | POA: Diagnosis not present

## 2021-12-06 DIAGNOSIS — Z96652 Presence of left artificial knee joint: Secondary | ICD-10-CM | POA: Diagnosis not present

## 2021-12-06 DIAGNOSIS — R262 Difficulty in walking, not elsewhere classified: Secondary | ICD-10-CM | POA: Diagnosis not present

## 2021-12-13 ENCOUNTER — Ambulatory Visit (INDEPENDENT_AMBULATORY_CARE_PROVIDER_SITE_OTHER): Payer: PPO | Admitting: Nurse Practitioner

## 2021-12-13 ENCOUNTER — Other Ambulatory Visit (HOSPITAL_BASED_OUTPATIENT_CLINIC_OR_DEPARTMENT_OTHER): Payer: Self-pay

## 2021-12-13 ENCOUNTER — Encounter (HOSPITAL_BASED_OUTPATIENT_CLINIC_OR_DEPARTMENT_OTHER): Payer: Self-pay | Admitting: Nurse Practitioner

## 2021-12-13 DIAGNOSIS — R42 Dizziness and giddiness: Secondary | ICD-10-CM | POA: Insufficient documentation

## 2021-12-13 MED ORDER — MECLIZINE HCL 25 MG PO TABS
25.0000 mg | ORAL_TABLET | Freq: Three times a day (TID) | ORAL | 3 refills | Status: DC | PRN
  Filled 2021-12-13: qty 90, 30d supply, fill #0
  Filled 2022-05-25: qty 90, 30d supply, fill #1

## 2021-12-13 MED ORDER — TIZANIDINE HCL 2 MG PO TABS
ORAL_TABLET | ORAL | 0 refills | Status: DC
  Filled 2021-12-13: qty 60, 15d supply, fill #0

## 2021-12-13 MED ORDER — TRAMADOL HCL 50 MG PO TABS
ORAL_TABLET | ORAL | 0 refills | Status: DC
  Filled 2021-12-13: qty 30, 7d supply, fill #0

## 2021-12-13 NOTE — Progress Notes (Signed)
Virtual Visit Encounter telephone visit.   I connected with  Emily [name] on 12/28/21 at 10:50 AM EDT by secure audio telemedicine application. I verified that I am speaking with the correct person using two identifiers.   I introduced myself as a Designer, jewellery with the practice. The limitations of evaluation and management by telemedicine discussed with the patient and the availability of in person appointments. The patient expressed verbal understanding and consent to proceed.  Participating parties in this visit include: Myself and patient and spouse  The patient is: Patient Location: Home I am: Provider Location: Office/Clinic Subjective:    CC and HPI: Emily [name] is a 71 y.o. year old female presenting for new evaluation and treatment of dizziness. Patient and spouse report the following: Sudden onset of dizziness last Tuesday while in PT for her knee. Was working on standing without assistance against a wall and suddenly felt dizzy and weak. She reports she felt like she was going to pass out.  She had to stop the exercise. She tells me that since that time she has been experiencing intermittent dizziness and weakness.  She reports that she does not feel stable on her feet which is a change from prior to Tuesday. She did have knee surgery on 25 May and up until last week have been going to therapy routinely and was making progress.  Since last Tuesday she has missed 2 of her therapy sessions due to the dizziness. She reports that when laying supine her symptoms are relieved.  She does not experience symptoms when she keeps her head still when she is upright.  Symptoms are exacerbated when she moves her head from side to side or up and down or changes the focus of her eyes quickly. She describes this sensation as the room spinning around her. She endorses a fall shortly before the onset of dizziness while she was getting out of bed.  She lost her balance and hit the  side of the bed and ended up on the floor.  She was unable to get up on her own and had to have the fire department come to help get her up. She is not having any dysphagia, vision changes, slurred speech, weakness on one side, memory problems. She is currently receiving her physical therapy at [facility] orthopedic   Past medical history, Surgical history, Family history not pertinant except as noted below, Social history, Allergies, and medications have been entered into the medical record, reviewed, and corrections made.   Review of Systems:  All review of systems negative except what is listed in the HPI  Objective:    Alert and oriented x 4 Speaking in clear sentences with no shortness of breath. No distress.  Impression and Recommendations:    Problem List Items Addressed This Visit     Vertigo, intermittent - Primary    Symptoms and presentation consistent with intermittent vertigo.  Patient unable to monitor her blood pressure at home and it does not sound like it has been monitored at physical therapy. She is not reporting any symptoms associated with elevated or reduced blood pressure aside from the dizziness.  She is also not exhibiting any stroke symptoms.  Unable to complete neurological exam today due to video visit however no reported weakness and no detected slurred speech.  She does have some congestion in her sinuses which could be contributing also consider possibility that the recent fall and surgery have contributed to the symptoms as well.  At this  time recommend starting on meclizine 3 times a day as needed for dizziness.  Discussed with patient and spouse the option of attempting vestibular rehab exercises on her own however the patient is not comfortable with this at this time.  I will reach out to [facility] orthopedics physical therapy department to see if this is something they can add on to her physical therapy for her knee.  It would be ideal if they could assist in  vestibular rehab while she is getting her regular PT in order to help keep her moving along with physical therapy.  We will follow-up once I have had the opportunity to speak with [facility] orthopedics.      Relevant Medications   meclizine (ANTIVERT) 25 MG tablet    orders and follow up as documented in EMR I discussed the assessment and treatment plan with the patient. The patient was provided an opportunity to ask questions and all were answered. The patient agreed with the plan and demonstrated an understanding of the instructions.   The patient was advised to call back or seek an in-person evaluation if the symptoms worsen or if the condition fails to improve as anticipated.  Follow-Up: TBD  I provided 22 minutes of non-face-to-face interaction with this non face-to-face encounter including intake, same-day documentation, and chart review.   Orma Render, NP , DNP, AGNP-c [place] at [facility] [phone] (fax)

## 2021-12-13 NOTE — Assessment & Plan Note (Addendum)
Symptoms and presentation consistent with intermittent vertigo.  Patient unable to monitor her blood pressure at home and it does not sound like it has been monitored at physical therapy. She is not reporting any symptoms associated with elevated or reduced blood pressure aside from the dizziness.  She is also not exhibiting any stroke symptoms.  Unable to complete neurological exam today due to video visit however no reported weakness and no detected slurred speech.  She does have some congestion in her sinuses which could be contributing also consider possibility that the recent fall and surgery have contributed to the symptoms as well.  At this time recommend starting on meclizine 3 times a day as needed for dizziness.  Discussed with patient and spouse the option of attempting vestibular rehab exercises on her own however the patient is not comfortable with this at this time.  I will reach out to [facility] orthopedics physical therapy department to see if this is something they can add on to her physical therapy for her knee.  It would be ideal if they could assist in vestibular rehab while she is getting her regular PT in order to help keep her moving along with physical therapy.  We will follow-up once I have had the opportunity to speak with [facility] orthopedics.

## 2021-12-15 ENCOUNTER — Telehealth (HOSPITAL_BASED_OUTPATIENT_CLINIC_OR_DEPARTMENT_OTHER): Payer: Self-pay

## 2021-12-15 DIAGNOSIS — H8113 Benign paroxysmal vertigo, bilateral: Secondary | ICD-10-CM

## 2021-12-15 NOTE — Telephone Encounter (Signed)
Spoke with patient she would like a referral to PT that does the vestibular rehab.

## 2021-12-15 NOTE — Addendum Note (Signed)
Addended by: Kilani Joffe, Clarise Cruz E on: 12/15/2021 06:20 PM   Modules accepted: Orders

## 2021-12-15 NOTE — Telephone Encounter (Signed)
Referral Placed for [facility]: [phone]

## 2021-12-26 ENCOUNTER — Other Ambulatory Visit (HOSPITAL_BASED_OUTPATIENT_CLINIC_OR_DEPARTMENT_OTHER): Payer: Self-pay

## 2021-12-26 DIAGNOSIS — Z96652 Presence of left artificial knee joint: Secondary | ICD-10-CM | POA: Diagnosis not present

## 2021-12-26 DIAGNOSIS — R262 Difficulty in walking, not elsewhere classified: Secondary | ICD-10-CM | POA: Diagnosis not present

## 2021-12-26 MED ORDER — ONDANSETRON HCL 4 MG PO TABS
ORAL_TABLET | ORAL | 0 refills | Status: DC
  Filled 2021-12-26: qty 30, 8d supply, fill #0

## 2021-12-27 ENCOUNTER — Ambulatory Visit: Payer: PPO | Attending: Nurse Practitioner | Admitting: Physical Therapy

## 2021-12-27 ENCOUNTER — Encounter: Payer: Self-pay | Admitting: Physical Therapy

## 2021-12-27 DIAGNOSIS — R2681 Unsteadiness on feet: Secondary | ICD-10-CM | POA: Diagnosis not present

## 2021-12-27 DIAGNOSIS — R42 Dizziness and giddiness: Secondary | ICD-10-CM | POA: Diagnosis not present

## 2021-12-27 NOTE — Therapy (Signed)
OUTPATIENT PHYSICAL THERAPY VESTIBULAR EVALUATION     Patient Name: Emily [name] MRN: [id number] DOB:September 10, 1950, 71 y.o., female Today's Date: 12/27/2021  PCP: Orma Render, NP  REFERRING PROVIDER: Orma Render, NP    PT End of Session - 12/27/21 1119     Visit Number 1    Number of Visits 9    Date for PT Re-Evaluation 01/24/22    Authorization Type HTA    Authorization Time Period 12/27/21 to 01/24/22    Progress Note Due on Visit 10    PT Start Time 1012    PT Stop Time 1056    PT Time Calculation (min) 44 min    Activity Tolerance Patient tolerated treatment well    Behavior During Therapy [facility] for tasks assessed/performed             Past Medical History:  Diagnosis Date   Acute bacterial sinusitis 06/29/2021   Acute non-recurrent ethmoidal sinusitis 08/05/2021   Allergy    seasonal    Anxiety    ARTHRALGIA 11/10/2009   Qualifier: Diagnosis of  By: Elease Hashimoto MD, Bruce     Cancer [facility]) 2009   squamous cell skin   Cataract    bilateral surgery, left unsuccessful   Cough 11/23/2020   Cystitis 02/12/2020   Depression    DYSPNEA 11/10/2009   Qualifier: Diagnosis of  By: Elease Hashimoto MD, Bruce     Eating disorder    GERD (gastroesophageal reflux disease)    Glaucoma    Head injury 07/23/2018   Hiatal hernia    History of cardiovascular stress test 04/2000   cardiolyte   History of flexible sigmoidoscopy 09/21/1999   Other fatigue 02/26/2020   Otitis of both ears 03/08/2020   Sinobronchitis 04/05/2021   WEIGHT GAIN 11/10/2009   Qualifier: Diagnosis of  By: Elease Hashimoto MD, Bruce     Past Surgical History:  Procedure Laterality Date   ABDOMINAL HYSTERECTOMY     APPENDECTOMY     CARDIAC CATHETERIZATION  04/2000   CARDIAC CATHETERIZATION     COLONOSCOPY  12/31/09   UPPER GASTROINTESTINAL ENDOSCOPY  09/15/1999   WISDOM TOOTH EXTRACTION     Patient Active Problem List   Diagnosis Date Noted   Vertigo, intermittent 12/13/2021   Preoperative evaluation of  a medical condition to rule out surgical contraindications (TAR required) 10/18/2021   Chronic pain of left knee 10/18/2021   Decreased calculated glomerular filtration rate (GFR) 10/18/2021   Pain of left lower extremity 03/28/2021   Forgetfulness 12/16/2020   Brain fog 12/16/2020   Generalized anxiety disorder 12/16/2020   Hypertension 11/23/2020   Leg edema 11/23/2020   Fatigue 02/26/2020   Lumbar spondylosis 09/25/2019   Primary osteoarthritis of both knees 08/14/2019   Hepatic steatosis 10/23/2016   Depression, recurrent ([place]) 09/07/2008    ONSET DATE: 12/15/2021   REFERRING DIAG: H81.13 (ICD-10-CM) - Benign paroxysmal vertigo of both ears   THERAPY DIAG:  Dizziness and giddiness - Plan: PT plan of care cert/re-cert  Unsteadiness on feet - Plan: PT plan of care cert/re-cert  Rationale for Evaluation and Treatment Rehabilitation  SUBJECTIVE:   SUBJECTIVE STATEMENT: Had a fall 3 days after surgery and the dizziness started soon after this. Its better now. I had the dizziness probably 4-5 days. I started taking Meclazine 2x/day, it took a few days after I started taking the medicine before the dizziness went away. Did not take it this morning. Had TKA 25th of May and still going to the other PT  clinic for this.   Pt accompanied by: self  PERTINENT HISTORY: CC and HPI: Emily [name] is a 71 y.o. year old female presenting for new evaluation and treatment of dizziness. Patient and spouse report the following: Sudden onset of dizziness last Tuesday while in PT for her knee. Was working on standing without assistance against a wall and suddenly felt dizzy and weak. She reports she felt like she was going to pass out.  She had to stop the exercise. She tells me that since that time she has been experiencing intermittent dizziness and weakness.  She reports that she does not feel stable on her feet which is a change from prior to Tuesday. She did have knee surgery on 25 May  and up until last week have been going to therapy routinely and was making progress.  Since last Tuesday she has missed 2 of her therapy sessions due to the dizziness. She reports that when laying supine her symptoms are relieved.  She does not experience symptoms when she keeps her head still when she is upright.  Symptoms are exacerbated when she moves her head from side to side or up and down or changes the focus of her eyes quickly. She describes this sensation as the room spinning around her. She endorses a fall shortly before the onset of dizziness while she was getting out of bed.  She lost her balance and hit the side of the bed and ended up on the floor.  She was unable to get up on her own and had to have the fire department come to help get her up. She is not having any dysphagia, vision changes, slurred speech, weakness on one side, memory problems. She is currently receiving her physical therapy at [facility] orthopedic   PAIN:  Are you having pain? No  PRECAUTIONS: Fall  WEIGHT BEARING RESTRICTIONS No  FALLS: Has patient fallen in last 6 months? Yes. Number of falls 2- one was when I stood up from the bed and fell against the bed, can't remember exactly what happened with the other fall   LIVING ENVIRONMENT: Lives with: lives with their spouse Lives in: House/apartment Stairs: Yes: External: 2 steps; none Has following equipment at home: Environmental consultant - 2 wheeled  PLOF: Independent, Independent with basic ADLs, Independent with gait, Independent with transfers, and Requires assistive device for independence  PATIENT GOALS get rid of whatever's left of this vertigo   OBJECTIVE:    COGNITION: Overall cognitive status: Within functional limits for tasks assessed and hx of forgetfulness per chart       Cervical ROM:    Active A/PROM (deg) eval  Flexion WNL   Extension WNL   Right lateral flexion   Left lateral flexion   Right rotation WNL   Left rotation WNL   (Blank  rows = not tested)  STRENGTH:   LOWER EXTREMITY MMT:   MMT Right eval Left eval  Hip flexion    Hip abduction    Hip adduction    Hip internal rotation    Hip external rotation    Knee flexion    Knee extension    Ankle dorsiflexion    Ankle plantarflexion    Ankle inversion    Ankle eversion    (Blank rows = not tested)   FUNCTIONAL TESTs:    PATIENT SURVEYS:  FOTO will need to do next session   VESTIBULAR ASSESSMENT   GENERAL OBSERVATION:     SYMPTOM BEHAVIOR:   Subjective history:  I was a little dizzy this morning but it wasn't a lot    Non-Vestibular symptoms: nausea/vomiting   Type of dizziness: Spinning/Vertigo   Frequency: very on and off, not consistent    Duration: not very long maybe 10 seconds    Aggravating factors:  standing up and turning head    Relieving factors: medication "I can't answer these questions because it barely happens any more"    Progression of symptoms: better   OCULOMOTOR EXAM:   Ocular Alignment: normal   Ocular ROM: No Limitations   Spontaneous Nystagmus: absent   Gaze-Induced Nystagmus: absent   Smooth Pursuits: intact   Saccades: intact      VESTIBULAR - OCULAR REFLEX:    Slow VOR: Normal   VOR Cancellation: Normal   Head-Impulse Test: HIT Right: negative HIT Left: negative   Dynamic Visual Acuity:     POSITIONAL TESTING: Right Dix-Hallpike: no nystagmus but did have very mild dizziness  Left Dix-Hallpike: no nystagmus but did have very mild dizziness     MOTION SENSITIVITY:    Motion Sensitivity Quotient  Intensity: 0 = none, 1 = Lightheaded, 2 = Mild, 3 = Moderate, 4 = Severe, 5 = Vomiting  Intensity  1. Sitting to supine 2   2. Supine to L side 0  3. Supine to R side 0  4. Supine to sitting 4  5. L Hallpike-Dix 2  6. Up from L  0  7. R Hallpike-Dix 2  8. Up from R  0  9. Sitting, head  tipped to L knee 0  10. Head up from L  knee 0  11. Sitting, head  tipped to R knee 0  12. Head up from R  knee  0  13. Sitting head turns x5 1  14.Sitting head nods x5 1  15. In stance, 180  turn to L  0  16. In stance, 180  turn to R 0    ORTHOSTATICS:  attempted but got repeated error messages from cuff, will need to perform next time   FUNCTIONAL GAIT:    VESTIBULAR TREATMENT:  Canalith Repositioning:    Gaze Adaptation:    Habituation:    Other:  PATIENT EDUCATION: Education details: exam findings, inner ear/BPPV anatomy and mechanics vs orthostatic concerns, POC  Person educated: Patient Education method: Explanation and Demonstration Education comprehension: verbalized understanding and needs further education   GOALS: Goals reviewed with patient? No  SHORT TERM GOALS: Target date: 01/10/2022   Will be compliant with appropriate vestibular based HEP  Baseline: Goal status: INITIAL   LONG TERM GOALS: Target date: 01/24/2022    Dizziness to be completely resolved  Baseline:  Goal status: INITIAL  2.  Will be independent with appropriate techniques for BPPV management PRN  Baseline:  Goal status: INITIAL  3.  Will be independent with techniques to improve safety with transfers in context of possible orthostatic hypotension Baseline:  Goal status: INITIAL   ASSESSMENT:  CLINICAL IMPRESSION: Patient is a 71 y.o. female who was seen today for physical therapy evaluation and treatment for BPPV. Emily [name] tells me she is feeling much better in terms of dizziness. VOR testing was negative, she did have very slight dizziness with Marye Round test B but no nystagmus. Had significant dizziness with positional changes, we tried to check orthostatics but kept getting error messages from the BP machine today- will need to retry this next session. Curiously, dizziness did not start after her fall happened originally but instead started after  she had some medication changes which makes me wonder if her dizziness is not pure BPPV. Will continue efforts to complete w/u next session.  Will stick to vestibular interventions only here as she is getting knee care at her other PT clinic.    OBJECTIVE IMPAIRMENTS decreased mobility, difficulty walking, and dizziness.   ACTIVITY LIMITATIONS sitting, standing, squatting, stairs, transfers, bed mobility, reach over head, and locomotion level  PARTICIPATION LIMITATIONS: community activity, occupation, and yard work  PERSONAL FACTORS Behavior pattern, Fitness, Past/current experiences, and Time since onset of injury/illness/exacerbation are also affecting patient's functional outcome.   REHAB POTENTIAL: Good  CLINICAL DECISION MAKING: Evolving/moderate complexity  EVALUATION COMPLEXITY: Moderate   PLAN: PT FREQUENCY: 1-2x/week  PT DURATION: 4 weeks  PLANNED INTERVENTIONS: Therapeutic exercises, Therapeutic activity, Neuromuscular re-education, Balance training, Gait training, Patient/Family education, Stair training, Vestibular training, Canalith repositioning, Cryotherapy, Moist heat, Manual therapy, and Re-evaluation  PLAN FOR NEXT SESSION: retry orthostatics, FOTO, possibly do Epley B    Emily [name] U PT DPT PN2  12/27/2021, 11:24 AM

## 2021-12-28 ENCOUNTER — Encounter (HOSPITAL_BASED_OUTPATIENT_CLINIC_OR_DEPARTMENT_OTHER): Payer: Self-pay | Admitting: Nurse Practitioner

## 2021-12-28 DIAGNOSIS — R262 Difficulty in walking, not elsewhere classified: Secondary | ICD-10-CM | POA: Diagnosis not present

## 2021-12-28 DIAGNOSIS — Z96652 Presence of left artificial knee joint: Secondary | ICD-10-CM | POA: Diagnosis not present

## 2022-01-02 DIAGNOSIS — R262 Difficulty in walking, not elsewhere classified: Secondary | ICD-10-CM | POA: Diagnosis not present

## 2022-01-02 DIAGNOSIS — Z96652 Presence of left artificial knee joint: Secondary | ICD-10-CM | POA: Diagnosis not present

## 2022-01-03 ENCOUNTER — Ambulatory Visit: Payer: PPO | Admitting: Physical Therapy

## 2022-01-04 DIAGNOSIS — Z96652 Presence of left artificial knee joint: Secondary | ICD-10-CM | POA: Diagnosis not present

## 2022-01-04 DIAGNOSIS — R262 Difficulty in walking, not elsewhere classified: Secondary | ICD-10-CM | POA: Diagnosis not present

## 2022-01-10 ENCOUNTER — Ambulatory Visit: Payer: PPO | Admitting: Physical Therapy

## 2022-01-11 DIAGNOSIS — R262 Difficulty in walking, not elsewhere classified: Secondary | ICD-10-CM | POA: Diagnosis not present

## 2022-01-11 DIAGNOSIS — Z96652 Presence of left artificial knee joint: Secondary | ICD-10-CM | POA: Diagnosis not present

## 2022-01-13 DIAGNOSIS — Z96652 Presence of left artificial knee joint: Secondary | ICD-10-CM | POA: Diagnosis not present

## 2022-01-13 DIAGNOSIS — R262 Difficulty in walking, not elsewhere classified: Secondary | ICD-10-CM | POA: Diagnosis not present

## 2022-01-16 ENCOUNTER — Other Ambulatory Visit (HOSPITAL_BASED_OUTPATIENT_CLINIC_OR_DEPARTMENT_OTHER): Payer: Self-pay

## 2022-01-16 MED ORDER — TRAMADOL HCL 50 MG PO TABS
50.0000 mg | ORAL_TABLET | Freq: Four times a day (QID) | ORAL | 0 refills | Status: DC | PRN
  Filled 2022-01-16: qty 30, 8d supply, fill #0

## 2022-01-17 ENCOUNTER — Encounter: Payer: PPO | Admitting: Physical Therapy

## 2022-01-24 ENCOUNTER — Other Ambulatory Visit (HOSPITAL_BASED_OUTPATIENT_CLINIC_OR_DEPARTMENT_OTHER): Payer: Self-pay

## 2022-01-24 ENCOUNTER — Encounter: Payer: PPO | Admitting: Physical Therapy

## 2022-01-24 ENCOUNTER — Ambulatory Visit (INDEPENDENT_AMBULATORY_CARE_PROVIDER_SITE_OTHER): Payer: PPO | Admitting: Nurse Practitioner

## 2022-01-24 DIAGNOSIS — J0140 Acute pansinusitis, unspecified: Secondary | ICD-10-CM | POA: Diagnosis not present

## 2022-01-24 MED ORDER — DEXAMETHASONE 4 MG PO TABS
4.0000 mg | ORAL_TABLET | Freq: Every day | ORAL | 0 refills | Status: DC
  Filled 2022-01-24: qty 6, 6d supply, fill #0

## 2022-01-24 MED ORDER — AZITHROMYCIN 250 MG PO TABS
ORAL_TABLET | ORAL | 1 refills | Status: AC
  Filled 2022-01-24: qty 6, 5d supply, fill #0
  Filled 2022-02-07: qty 6, 5d supply, fill #1

## 2022-01-24 NOTE — Progress Notes (Signed)
Virtual Visit Encounter telephone visit.   I connected with  Emily [name] on 01/25/22 at 11:10 AM EDT by secure audio telemedicine application. I verified that I am speaking with the correct person using two identifiers.   I introduced myself as a Designer, jewellery with the practice. The limitations of evaluation and management by telemedicine discussed with the patient and the availability of in person appointments. The patient expressed verbal understanding and consent to proceed.  Participating parties in this visit include: Myself and patient  The patient is: Patient Location: Home I am: Provider Location: Office/Clinic Subjective:    CC and HPI: Emily [name] is a 71 y.o. year old female presenting for new evaluation and treatment of sinusitis. Sinusitis Symptoms have been ongoing for several days.  Emily [name] tells me that she is experiencing symptoms of frontal and maxillary sinus pain and pressure, rhinorrhea, congestion, sore throat, ear pain and pressure, cough, fatigue, and chills.  She is not sure if she is experiencing a fever at this time.  She does have a history of repeated episodes of difficult to treat sinusitis.   Past medical history, Surgical history, Family history not pertinant except as noted below, Social history, Allergies, and medications have been entered into the medical record, reviewed, and corrections made.   Review of Systems:  All review of systems negative except what is listed in the HPI  Objective:    Alert and oriented x 4.  Patient is audibly congested Speaking in clear sentences with no shortness of breath. No distress.  Impression and Recommendations:    Problem List Items Addressed This Visit     Acute non-recurrent pansinusitis - Primary    Symptoms and presentation consistent with acute sinusitis. Will send treatment with azithromycin as this is typically effective for the patient in Emily [name] stages of infection. Recommend close  monitoring for signs of worsening infection for escalation of treatment given history of difficult to treat infections and prolonged infection.       Relevant Medications   azithromycin (ZITHROMAX) 250 MG tablet   dexamethasone (DECADRON) 4 MG tablet    orders and follow up as documented in EMR I discussed the assessment and treatment plan with the patient. The patient was provided an opportunity to ask questions and all were answered. The patient agreed with the plan and demonstrated an understanding of the instructions.   The patient was advised to call back or seek an in-person evaluation if the symptoms worsen or if the condition fails to improve as anticipated.  Follow-Up: prn  I provided 22 minutes of non-face-to-face interaction with this non face-to-face encounter including intake, same-day documentation, and chart review.   Orma Render, NP , DNP, AGNP-c [place] [facility] [phone] (fax)

## 2022-01-25 ENCOUNTER — Encounter (HOSPITAL_BASED_OUTPATIENT_CLINIC_OR_DEPARTMENT_OTHER): Payer: Self-pay | Admitting: Nurse Practitioner

## 2022-01-25 DIAGNOSIS — J0140 Acute pansinusitis, unspecified: Secondary | ICD-10-CM

## 2022-01-25 HISTORY — DX: Acute pansinusitis, unspecified: J01.40

## 2022-01-25 NOTE — Assessment & Plan Note (Signed)
Symptoms and presentation consistent with acute sinusitis. Will send treatment with azithromycin as this is typically effective for the patient in Larenz Frasier stages of infection. Recommend close monitoring for signs of worsening infection for escalation of treatment given history of difficult to treat infections and prolonged infection.

## 2022-01-31 ENCOUNTER — Other Ambulatory Visit (HOSPITAL_BASED_OUTPATIENT_CLINIC_OR_DEPARTMENT_OTHER): Payer: Self-pay

## 2022-02-07 ENCOUNTER — Other Ambulatory Visit (HOSPITAL_BASED_OUTPATIENT_CLINIC_OR_DEPARTMENT_OTHER): Payer: Self-pay

## 2022-02-10 ENCOUNTER — Other Ambulatory Visit (HOSPITAL_BASED_OUTPATIENT_CLINIC_OR_DEPARTMENT_OTHER): Payer: Self-pay

## 2022-02-10 MED ORDER — TRAMADOL HCL 50 MG PO TABS
50.0000 mg | ORAL_TABLET | Freq: Four times a day (QID) | ORAL | 0 refills | Status: DC | PRN
Start: 2022-02-10 — End: 2022-03-28
  Filled 2022-02-10: qty 30, 8d supply, fill #0

## 2022-02-17 DIAGNOSIS — H401131 Primary open-angle glaucoma, bilateral, mild stage: Secondary | ICD-10-CM | POA: Diagnosis not present

## 2022-02-21 ENCOUNTER — Other Ambulatory Visit (HOSPITAL_BASED_OUTPATIENT_CLINIC_OR_DEPARTMENT_OTHER): Payer: Self-pay | Admitting: Nurse Practitioner

## 2022-02-21 ENCOUNTER — Other Ambulatory Visit (HOSPITAL_BASED_OUTPATIENT_CLINIC_OR_DEPARTMENT_OTHER): Payer: Self-pay

## 2022-02-22 ENCOUNTER — Other Ambulatory Visit (HOSPITAL_BASED_OUTPATIENT_CLINIC_OR_DEPARTMENT_OTHER): Payer: Self-pay

## 2022-02-22 ENCOUNTER — Other Ambulatory Visit (HOSPITAL_BASED_OUTPATIENT_CLINIC_OR_DEPARTMENT_OTHER): Payer: Self-pay | Admitting: Nurse Practitioner

## 2022-02-22 DIAGNOSIS — R944 Abnormal results of kidney function studies: Secondary | ICD-10-CM

## 2022-02-22 MED ORDER — DAPAGLIFLOZIN PROPANEDIOL 10 MG PO TABS
10.0000 mg | ORAL_TABLET | Freq: Every day | ORAL | 3 refills | Status: DC
  Filled 2022-02-22: qty 90, 90d supply, fill #0

## 2022-02-23 ENCOUNTER — Other Ambulatory Visit (HOSPITAL_BASED_OUTPATIENT_CLINIC_OR_DEPARTMENT_OTHER): Payer: Self-pay

## 2022-02-23 MED ORDER — TIZANIDINE HCL 2 MG PO TABS
2.0000 mg | ORAL_TABLET | ORAL | 0 refills | Status: DC
  Filled 2022-02-23: qty 60, 15d supply, fill #0

## 2022-02-27 ENCOUNTER — Other Ambulatory Visit (HOSPITAL_BASED_OUTPATIENT_CLINIC_OR_DEPARTMENT_OTHER): Payer: Self-pay

## 2022-02-28 ENCOUNTER — Other Ambulatory Visit (HOSPITAL_BASED_OUTPATIENT_CLINIC_OR_DEPARTMENT_OTHER): Payer: Self-pay

## 2022-03-03 ENCOUNTER — Encounter (HOSPITAL_BASED_OUTPATIENT_CLINIC_OR_DEPARTMENT_OTHER): Payer: Self-pay | Admitting: Nurse Practitioner

## 2022-03-03 ENCOUNTER — Ambulatory Visit (INDEPENDENT_AMBULATORY_CARE_PROVIDER_SITE_OTHER): Payer: PPO | Admitting: Nurse Practitioner

## 2022-03-03 VITALS — BP 148/90 | HR 79 | Ht 66.0 in | Wt 264.0 lb

## 2022-03-03 DIAGNOSIS — J101 Influenza due to other identified influenza virus with other respiratory manifestations: Secondary | ICD-10-CM

## 2022-03-03 DIAGNOSIS — I10 Essential (primary) hypertension: Secondary | ICD-10-CM | POA: Diagnosis not present

## 2022-03-03 DIAGNOSIS — J029 Acute pharyngitis, unspecified: Secondary | ICD-10-CM

## 2022-03-03 LAB — POCT INFLUENZA A/B
Influenza A, POC: POSITIVE — AB
Influenza B, POC: NEGATIVE

## 2022-03-03 LAB — POCT RAPID STREP A (OFFICE): Rapid Strep A Screen: NEGATIVE

## 2022-03-03 NOTE — Progress Notes (Signed)
Emily Render, DNP, AGNP-c [facility] [address]  [place], [place] [address] [phone]  Subjective:   Emily [name] is a 71 y.o. female presents to day for evaluation of: Acute Visit (Patient comes in today for cough and cold x 7 days. Sore throat body aches patient denies fever)  1. Sore throat 2. Influenza A [name] endorses concerns with sore throat, cough, fatigue, sever body aches, and weakness for the past 5-7 days. Her husband Emily [name] was sick with similar symptoms last week. She does not think she has had a fever, but she has had chills. She has been working while ill. She does works with the public on a daily basis, but she is not sure of any other known sick contacts. She denies ShoB, or difficulty breathing.   PMH, Medications, and Allergies reviewed and updated in chart as appropriate.   ROS negative except for what is listed in HPI. Objective:  BP (!) 148/90   Pulse 79   Ht '5\' 6"'$  (1.676 m)   Wt 264 lb (119.7 kg)   SpO2 96%   BMI 42.61 kg/m  Physical Exam Vitals and nursing note reviewed.  Constitutional:      Appearance: She is ill-appearing.  HENT:     Head: Normocephalic.     Nose: Congestion present.     Mouth/Throat:     Mouth: Mucous membranes are moist.     Pharynx: Posterior oropharyngeal erythema present.  Eyes:     Extraocular Movements: Extraocular movements intact.     Pupils: Pupils are equal, round, and reactive to light.  Neck:     Vascular: No carotid bruit.  Cardiovascular:     Rate and Rhythm: Normal rate and regular rhythm.     Pulses: Normal pulses.     Heart sounds: Normal heart sounds.  Pulmonary:     Effort: Pulmonary effort is normal.     Breath sounds: Wheezing present. No rhonchi.  Musculoskeletal:     Cervical back: Normal range of motion.  Lymphadenopathy:     Cervical: Cervical adenopathy present.  Skin:    General: Skin is warm and dry.     Capillary Refill:  Capillary refill takes less than 2 seconds.  Neurological:     General: No focal deficit present.     Mental Status: She is alert and oriented to person, place, and time.  Psychiatric:        Mood and Affect: Mood normal.        Behavior: Behavior normal.        Thought Content: Thought content normal.        Judgment: Judgment normal.           Assessment & Plan:   Problem List Items Addressed This Visit     Hypertension    Blood pressure extremely elevated on initial check in office today.  Patient is quite ill with influenza at this time and has not taken her blood pressure medicine today.  Repeat blood pressure shows some improvement however it is still elevated.  Recommend taking blood pressure medicine immediately once she gets home.  If blood pressures remain elevated recommend follow-up.      Influenza A - Primary    Positive for influenza in the office today.  At this time she has been ill for greater than 7 days therefore we are outside the window of using medication such as Tamiflu.  Discussed the importance of increased  hydration and rest as well as supportive therapy that can be helpful for symptom management.  Recommend the patient remain out of work for at least the remainder of the week to allow time for complete healing.  She expresses understanding.  Follow-up if symptoms worsen or fail to improve.      Other Visit Diagnoses     Sore throat       Relevant Orders   POCT Influenza A/B (Completed)   POCT rapid strep A (Completed)         Emily Render, DNP, AGNP-c 03/13/2022  8:34 PM    History, Medications, Surgery, SDOH, and Family History reviewed and updated as appropriate.

## 2022-03-03 NOTE — Patient Instructions (Signed)
You have tested positive for Flu A today  You can take tylenol and ibuprofen to help with aches and pain.   I want you to REST and drink lots of water.

## 2022-03-13 ENCOUNTER — Other Ambulatory Visit (HOSPITAL_BASED_OUTPATIENT_CLINIC_OR_DEPARTMENT_OTHER): Payer: Self-pay

## 2022-03-13 ENCOUNTER — Other Ambulatory Visit (HOSPITAL_BASED_OUTPATIENT_CLINIC_OR_DEPARTMENT_OTHER): Payer: Self-pay | Admitting: Nurse Practitioner

## 2022-03-13 DIAGNOSIS — J101 Influenza due to other identified influenza virus with other respiratory manifestations: Secondary | ICD-10-CM

## 2022-03-13 DIAGNOSIS — I10 Essential (primary) hypertension: Secondary | ICD-10-CM

## 2022-03-13 DIAGNOSIS — J111 Influenza due to unidentified influenza virus with other respiratory manifestations: Secondary | ICD-10-CM | POA: Insufficient documentation

## 2022-03-13 HISTORY — DX: Influenza due to other identified influenza virus with other respiratory manifestations: J10.1

## 2022-03-13 NOTE — Assessment & Plan Note (Signed)
Blood pressure extremely elevated on initial check in office today.  Patient is quite ill with influenza at this time and has not taken her blood pressure medicine today.  Repeat blood pressure shows some improvement however it is still elevated.  Recommend taking blood pressure medicine immediately once she gets home.  If blood pressures remain elevated recommend follow-up.

## 2022-03-13 NOTE — Assessment & Plan Note (Signed)
Positive for influenza in the office today.  At this time she has been ill for greater than 7 days therefore we are outside the window of using medication such as Tamiflu.  Discussed the importance of increased hydration and rest as well as supportive therapy that can be helpful for symptom management.  Recommend the patient remain out of work for at least the remainder of the week to allow time for complete healing.  She expresses understanding.  Follow-up if symptoms worsen or fail to improve.

## 2022-03-16 ENCOUNTER — Other Ambulatory Visit (HOSPITAL_BASED_OUTPATIENT_CLINIC_OR_DEPARTMENT_OTHER): Payer: Self-pay

## 2022-03-17 ENCOUNTER — Other Ambulatory Visit (HOSPITAL_BASED_OUTPATIENT_CLINIC_OR_DEPARTMENT_OTHER): Payer: Self-pay

## 2022-03-19 ENCOUNTER — Other Ambulatory Visit (HOSPITAL_BASED_OUTPATIENT_CLINIC_OR_DEPARTMENT_OTHER): Payer: Self-pay

## 2022-03-19 ENCOUNTER — Other Ambulatory Visit (HOSPITAL_BASED_OUTPATIENT_CLINIC_OR_DEPARTMENT_OTHER): Payer: Self-pay | Admitting: Nurse Practitioner

## 2022-03-19 DIAGNOSIS — I10 Essential (primary) hypertension: Secondary | ICD-10-CM

## 2022-03-20 ENCOUNTER — Other Ambulatory Visit (HOSPITAL_BASED_OUTPATIENT_CLINIC_OR_DEPARTMENT_OTHER): Payer: Self-pay

## 2022-03-20 DIAGNOSIS — I10 Essential (primary) hypertension: Secondary | ICD-10-CM

## 2022-03-20 MED ORDER — CHLORTHALIDONE 25 MG PO TABS
25.0000 mg | ORAL_TABLET | Freq: Every day | ORAL | 12 refills | Status: DC
  Filled 2022-03-20: qty 30, 30d supply, fill #0
  Filled 2022-06-13: qty 30, 30d supply, fill #1
  Filled 2022-06-30 – 2022-08-11 (×3): qty 30, 30d supply, fill #2
  Filled 2022-09-12 (×2): qty 30, 30d supply, fill #3
  Filled 2023-01-23: qty 30, 30d supply, fill #4

## 2022-03-22 ENCOUNTER — Other Ambulatory Visit (HOSPITAL_BASED_OUTPATIENT_CLINIC_OR_DEPARTMENT_OTHER): Payer: Self-pay

## 2022-03-24 ENCOUNTER — Other Ambulatory Visit (HOSPITAL_BASED_OUTPATIENT_CLINIC_OR_DEPARTMENT_OTHER): Payer: Self-pay

## 2022-03-24 ENCOUNTER — Encounter (HOSPITAL_BASED_OUTPATIENT_CLINIC_OR_DEPARTMENT_OTHER): Payer: Self-pay | Admitting: Pharmacist

## 2022-03-24 NOTE — Telephone Encounter (Signed)
Rx removed from pended orders.   Per PCP...   Please call patient and ask who has been prescribing these. It doesn't look like I have ever written for this medication for her.

## 2022-03-27 ENCOUNTER — Other Ambulatory Visit (HOSPITAL_BASED_OUTPATIENT_CLINIC_OR_DEPARTMENT_OTHER): Payer: Self-pay

## 2022-03-28 ENCOUNTER — Other Ambulatory Visit (HOSPITAL_BASED_OUTPATIENT_CLINIC_OR_DEPARTMENT_OTHER): Payer: Self-pay

## 2022-03-28 MED ORDER — TRAMADOL HCL 50 MG PO TABS
50.0000 mg | ORAL_TABLET | Freq: Four times a day (QID) | ORAL | 0 refills | Status: DC | PRN
  Filled 2022-03-28: qty 30, 8d supply, fill #0

## 2022-03-29 ENCOUNTER — Emergency Department (HOSPITAL_BASED_OUTPATIENT_CLINIC_OR_DEPARTMENT_OTHER): Payer: PPO

## 2022-03-29 ENCOUNTER — Other Ambulatory Visit: Payer: Self-pay

## 2022-03-29 ENCOUNTER — Telehealth (HOSPITAL_BASED_OUTPATIENT_CLINIC_OR_DEPARTMENT_OTHER): Payer: Self-pay

## 2022-03-29 ENCOUNTER — Emergency Department (HOSPITAL_BASED_OUTPATIENT_CLINIC_OR_DEPARTMENT_OTHER)
Admission: EM | Admit: 2022-03-29 | Discharge: 2022-03-29 | Disposition: A | Discharge status: Home / Self Care | Payer: PPO | Attending: Emergency Medicine | Admitting: Emergency Medicine

## 2022-03-29 ENCOUNTER — Encounter (HOSPITAL_BASED_OUTPATIENT_CLINIC_OR_DEPARTMENT_OTHER): Payer: Self-pay | Admitting: Emergency Medicine

## 2022-03-29 ENCOUNTER — Other Ambulatory Visit (HOSPITAL_BASED_OUTPATIENT_CLINIC_OR_DEPARTMENT_OTHER): Payer: Self-pay

## 2022-03-29 DIAGNOSIS — R079 Chest pain, unspecified: Secondary | ICD-10-CM | POA: Diagnosis not present

## 2022-03-29 DIAGNOSIS — Z7982 Long term (current) use of aspirin: Secondary | ICD-10-CM | POA: Insufficient documentation

## 2022-03-29 DIAGNOSIS — Z79899 Other long term (current) drug therapy: Secondary | ICD-10-CM | POA: Diagnosis not present

## 2022-03-29 DIAGNOSIS — R0602 Shortness of breath: Secondary | ICD-10-CM | POA: Diagnosis not present

## 2022-03-29 DIAGNOSIS — R200 Anesthesia of skin: Secondary | ICD-10-CM | POA: Insufficient documentation

## 2022-03-29 DIAGNOSIS — R0789 Other chest pain: Secondary | ICD-10-CM | POA: Diagnosis not present

## 2022-03-29 DIAGNOSIS — R531 Weakness: Secondary | ICD-10-CM | POA: Diagnosis not present

## 2022-03-29 LAB — PROTIME-INR
INR: 1 (ref 0.8–1.2)
Prothrombin Time: 13.4 seconds (ref 11.4–15.2)

## 2022-03-29 LAB — COMPREHENSIVE METABOLIC PANEL
ALT: 28 U/L (ref 0–44)
AST: 21 U/L (ref 15–41)
Albumin: 4.4 g/dL (ref 3.5–5.0)
Alkaline Phosphatase: 58 U/L (ref 38–126)
Anion gap: 9 (ref 5–15)
BUN: 12 mg/dL (ref 8–23)
CO2: 30 mmol/L (ref 22–32)
Calcium: 9.9 mg/dL (ref 8.9–10.3)
Chloride: 99 mmol/L (ref 98–111)
Creatinine, Ser: 0.98 mg/dL (ref 0.44–1.00)
GFR, Estimated: >60 mL/min (ref >60)
Glucose, Bld: 131 mg/dL — ABNORMAL HIGH (ref 70–99)
Potassium: 3.6 mmol/L (ref 3.5–5.1)
Sodium: 138 mmol/L (ref 135–145)
Total Bilirubin: 0.6 mg/dL (ref 0.3–1.2)
Total Protein: 7.3 g/dL (ref 6.5–8.1)

## 2022-03-29 LAB — CBG MONITORING, ED: Glucose-Capillary: 147 mg/dL — ABNORMAL HIGH (ref 70–99)

## 2022-03-29 LAB — TROPONIN I (HIGH SENSITIVITY)
Troponin I (High Sensitivity): 3 ng/L (ref <18)
Troponin I (High Sensitivity): 4 ng/L (ref <18)

## 2022-03-29 LAB — CBC
HCT: 44.6 % (ref 36.0–46.0)
Hemoglobin: 15.2 g/dL — ABNORMAL HIGH (ref 12.0–15.0)
MCH: 30.3 pg (ref 26.0–34.0)
MCHC: 34.1 g/dL (ref 30.0–36.0)
MCV: 89 fL (ref 80.0–100.0)
Platelets: 300 10*3/uL (ref 150–400)
RBC: 5.01 MIL/uL (ref 3.87–5.11)
RDW: 13.2 % (ref 11.5–15.5)
WBC: 5.6 10*3/uL (ref 4.0–10.5)
nRBC: 0 % (ref 0.0–0.2)

## 2022-03-29 LAB — DIFFERENTIAL
Abs Immature Granulocytes: 0.02 10*3/uL (ref 0.00–0.07)
Basophils Absolute: 0 10*3/uL (ref 0.0–0.1)
Basophils Relative: 1 %
Eosinophils Absolute: 0.2 10*3/uL (ref 0.0–0.5)
Eosinophils Relative: 3 %
Immature Granulocytes: 0 %
Lymphocytes Relative: 29 %
Lymphs Abs: 1.6 10*3/uL (ref 0.7–4.0)
Monocytes Absolute: 0.7 10*3/uL (ref 0.1–1.0)
Monocytes Relative: 12 %
Neutro Abs: 3.1 10*3/uL (ref 1.7–7.7)
Neutrophils Relative %: 55 %

## 2022-03-29 LAB — APTT: aPTT: 30 seconds (ref 24–36)

## 2022-03-29 LAB — ETHANOL: Alcohol, Ethyl (B): <10 mg/dL (ref <10)

## 2022-03-29 MED ORDER — ASPIRIN 325 MG PO TABS
325.0000 mg | ORAL_TABLET | Freq: Every day | ORAL | Status: DC
  Administered 2022-03-29: 325 mg via ORAL
  Filled 2022-03-29: qty 1

## 2022-03-29 MED ORDER — DICLOFENAC SODIUM 1 % EX GEL
4.0000 g | Freq: Four times a day (QID) | CUTANEOUS | 0 refills | Status: DC
  Filled 2022-03-29: qty 100, 7d supply, fill #0

## 2022-03-29 MED ORDER — SODIUM CHLORIDE 0.9% FLUSH
3.0000 mL | Freq: Once | INTRAVENOUS | Status: AC
  Administered 2022-03-29: 3 mL via INTRAVENOUS
  Filled 2022-03-29: qty 3

## 2022-03-29 NOTE — ED Provider Notes (Signed)
[facility] EMERGENCY DEPT Provider Note   CSN: [id number] Arrival date & time: 03/29/22  1240     History  Chief Complaint  Patient presents with   Code Stroke    Emily [name] is a 71 y.o. female.  71 yo F with a chief complaints of left-sided chest pain.  This started this morning.  She felt a bit fatigued yesterday but denies any other specific symptoms.  She had made the bed and called her husband and because she was not feeling well.  She has a history of anxiety and thought maybe she was having a panic attack and took a Xanax but did not have any significant improvement.  She then came with her husband to the emergency department for evaluation.   Shortness of Breath      Home Medications Prior to Admission medications   Medication Sig Start Date End Date Taking? Authorizing Provider  diclofenac Sodium (VOLTAREN) 1 % GEL Apply 4 g topically 4 (four) times daily. 03/29/22  Yes Deno Etienne, DO  ALPRAZolam Duanne Moron) 1 MG tablet Take 1/2 to 1 tablet by mouth daily as needed (90 day supply) 10/27/21     aspirin 81 MG EC tablet Take 1 tablet by mouth twice daily for 2 weeks, then take 1 tablet by mouth once daily for 2 weeks 11/02/21     azelastine (OPTIVAR) 0.05 % ophthalmic solution Place 2 drops into both eyes 2 (two) times daily. 09/21/21   Orma Render, NP  Blood Pressure Monitor MISC Use as directed 11/23/20   Early, Coralee Pesa, NP  buPROPion (WELLBUTRIN XL) 150 MG 24 hr tablet Take 1 tablet by mouth every morning 10/27/21     chlorthalidone (HYGROTON) 25 MG tablet Take 1 tablet (25 mg total) by mouth daily. 03/20/22   Orma Render, NP  Cholecalciferol (VITAMIN D3) 1.25 MG (50000 UT) CAPS Take 1 capsule by mouth weekly 10/27/21     cycloSPORINE (RESTASIS) 0.05 % ophthalmic emulsion Instill 1 drop into both eyes twice daily. 08/30/21     dapagliflozin propanediol (FARXIGA) 10 MG TABS tablet Take 1 tablet (10 mg total) by mouth daily. 02/22/22   Orma Render, NP   dexamethasone (DECADRON) 4 MG tablet Take 1 tablet (4 mg total) by mouth daily. 01/24/22   Orma Render, NP  ergocalciferol (VITAMIN D2) 1.25 MG (50000 UT) capsule Take 1 capsule by mouth once a week. 04/21/19   [provider]  meclizine (ANTIVERT) 25 MG tablet Take 1 tablet (25 mg total) by mouth 3 (three) times daily as needed for dizziness. 12/13/21   Orma Render, NP  ondansetron (ZOFRAN) 4 MG tablet Take 1 tablet by mouth every six to eight hours as needed 12/26/21     oxyCODONE (OXY IR/ROXICODONE) 5 MG immediate release tablet Take 1 tablet by mouth every four hours as needed for pain 11/18/21     ROCKLATAN 0.02-0.005 % SOLN Apply to eye. 03/27/21   [provider]  tiZANidine (ZANAFLEX) 2 MG tablet Take 1 tablet (2 mg total) by mouth every 6-8 hours. 02/23/22   Orma Render, NP  traMADol (ULTRAM) 50 MG tablet Take 1 tablet (50 mg total) by mouth every 6 (six) hours as needed for pain. 03/28/22     traZODone (DESYREL) 100 MG tablet Take 300 mg by mouth at bedtime.    [provider]  triamcinolone ointment (KENALOG) 0.1 % PLEASE SEE ATTACHED FOR DETAILED DIRECTIONS 02/23/21   [provider]  Allergies    Doxycycline, Penicillins, and Prednisone    Review of Systems   Review of Systems  Respiratory:  Positive for shortness of breath.     Physical Exam Updated Vital Signs BP 123/81   Pulse 70   Temp 98.1 F (36.7 C)   Resp 17   SpO2 98%  Physical Exam Vitals and nursing note reviewed.  Constitutional:      General: She is not in acute distress.    Appearance: She is well-developed. She is not diaphoretic.  HENT:     Head: Normocephalic and atraumatic.  Eyes:     Pupils: Pupils are equal, round, and reactive to light.  Cardiovascular:     Rate and Rhythm: Normal rate and regular rhythm.     Heart sounds: No murmur heard.    No friction rub. No gallop.  Pulmonary:     Effort: Pulmonary effort is normal.     Breath sounds: No wheezing or  rales.  Abdominal:     General: There is no distension.     Palpations: Abdomen is soft.     Tenderness: There is no abdominal tenderness.  Musculoskeletal:        General: No tenderness.     Cervical back: Normal range of motion and neck supple.     Comments: Pain along the left anterior chest wall about the lateral clavicular line about ribs 2 through 4 reproduce her discomfort.  Pulse motor and sensation intact distally.  No appreciable weakness on exam but does have some hesitation due to discomfort.  Skin:    General: Skin is warm and dry.  Neurological:     Mental Status: She is alert and oriented to person, place, and time.  Psychiatric:        Behavior: Behavior normal.     ED Results / Procedures / Treatments   Labs (all labs ordered are listed, but only abnormal results are displayed) Labs Reviewed  CBC - Abnormal; Notable for the following components:      Result Value   Hemoglobin 15.2 (*)    All other components within normal limits  COMPREHENSIVE METABOLIC PANEL - Abnormal; Notable for the following components:   Glucose, Bld 131 (*)    All other components within normal limits  CBG MONITORING, ED - Abnormal; Notable for the following components:   Glucose-Capillary 147 (*)    All other components within normal limits  PROTIME-INR  APTT  DIFFERENTIAL  ETHANOL  TROPONIN I (HIGH SENSITIVITY)    EKG None  Radiology DG Chest Port 1 View  Result Date: 03/29/2022 CLINICAL DATA:  Chest pain, shortness of breath EXAM: PORTABLE CHEST 1 VIEW COMPARISON:  03/15/2021 FINDINGS: The heart size and mediastinal contours are within normal limits. Both lungs are clear. The visualized skeletal structures are unremarkable. IMPRESSION: No active disease. Electronically Signed   By: Elmer Picker M.D.   On: 03/29/2022 14:14   CT HEAD CODE STROKE WO CONTRAST  Result Date: 03/29/2022 CLINICAL DATA:  Code stroke.  Left arm numbness and weakness EXAM: CT HEAD WITHOUT  CONTRAST TECHNIQUE: Contiguous axial images were obtained from the base of the skull through the vertex without intravenous contrast. RADIATION DOSE REDUCTION: This exam was performed according to the departmental dose-optimization program which includes automated exposure control, adjustment of the mA and/or kV according to patient size and/or use of iterative reconstruction technique. COMPARISON:  Brain MRI 09/14/2009, CT head 07/26/2018 FINDINGS: Brain: There is no acute intracranial hemorrhage, extra-axial fluid collection,  or acute infarct. Parenchymal volume is normal for age. The ventricles are normal in size. Gray-white differentiation is preserved. There is no mass lesion.  There is no mass effect or midline shift. Vascular: No hyperdense vessel or unexpected calcification. Skull: Normal. Negative for fracture or focal lesion. Sinuses/Orbits: The imaged paranasal sinuses are clear. Bilateral lens implants are in place. The globes and orbits are otherwise unremarkable. Other: None. ASPECTS [facility] Stroke Program Early CT Score) - Ganglionic level infarction (caudate, lentiform nuclei, internal capsule, insula, M1-M3 cortex): 7 - Supraganglionic infarction (M4-M6 cortex): 3 Total score (0-10 with 10 being normal): 10 IMPRESSION: 1. No acute intracranial pathology. 2. ASPECTS is 10 These results were called by telephone at the time of interpretation on 03/29/2022 at 1:35 pm to provider Lyrick Lagrand , who verbally acknowledged these results. Electronically Signed   By: Valetta Mole M.D.   On: 03/29/2022 13:40    Procedures Procedures    Medications Ordered in ED Medications  aspirin tablet 325 mg (325 mg Oral Given 03/29/22 1404)  sodium chloride flush (NS) 0.9 % injection 3 mL (3 mLs Intravenous Given 03/29/22 1405)    ED Course/ Medical Decision Making/ A&P                           Medical Decision Making Amount and/or Complexity of Data Reviewed Labs: ordered. Radiology:  ordered.  Risk OTC drugs.   71 yo F with a chief complaints of sudden onset fatigue and left sided numbness and weakness.  She thinks it started abruptly about 915 or 930.  She was made a code stroke on arrival throughout triage process.  Case was discussed briefly with Dr. Quinn Axe, neurology felt outside the window for TNK.  CT of the head I discussed with radiology who felt negative for acute pathology.  On my assessment of the patient I suspect she much more likely has musculoskeletal chest pain.  This seems to be the cause of her left arm symptoms.  I will obtain a laboratory evaluation to assess for ACS.  Seems completely atypical for pulmonary embolism and no further work-up is warranted.  Chest x-ray independently interpreted by me without focal pneumothorax.  No leukocytosis no anemia no significant electrolyte abnormalities.  His initial troponin is negative.  Awaiting second.  Patient care was signed out to Dr. Kathrynn Humble, please see his note for further details care in the ED.  The patients results and plan were reviewed and discussed.   Any x-rays performed were independently reviewed by myself.   Differential diagnosis were considered with the presenting HPI.  Medications  aspirin tablet 325 mg (325 mg Oral Given 03/29/22 1404)  sodium chloride flush (NS) 0.9 % injection 3 mL (3 mLs Intravenous Given 03/29/22 1405)    Vitals:   03/29/22 1307 03/29/22 1355 03/29/22 1430 03/29/22 1445  BP: 132/87 138/85 128/83 123/81  Pulse: 84 76 66 70  Resp: '18 20 19 17  '$ Temp: 98 F (36.7 C) 98.1 F (36.7 C)    TempSrc: Oral     SpO2: 97% 97% 98% 98%    Final diagnoses:  Nonspecific chest pain    Admission/ observation were discussed with the admitting physician, patient and/or family and they are comfortable with the plan.          Final Clinical Impression(s) / ED Diagnoses Final diagnoses:  Nonspecific chest pain    Rx / DC Orders ED Discharge Orders  Ordered     diclofenac Sodium (VOLTAREN) 1 % GEL  4 times daily        03/29/22 [place], [place], DO 03/29/22 1516

## 2022-03-29 NOTE — ED Triage Notes (Signed)
Feeling short of breath, fatigued, chest hurts intermittent, left arm felt numb for few minutes ,now feels weak. Started 0930 today.

## 2022-03-29 NOTE — Telephone Encounter (Signed)
Patient called in stating that she was in a meeting for work in [place]. That she was not feeling right and that when she walked to the restroom she was short of breath. Per Laretta Bolster she was advise to seek medical assistant in [place] and advised not to drive to [place].

## 2022-03-29 NOTE — Discharge Instructions (Addendum)

## 2022-03-29 NOTE — Consult Note (Signed)
Code stroke activated at 1324. LKW 2030. CP, SOB, LUE numbness(resolved), L hand grip weakness.   Paged Dr Quinn Axe at [phone]. Pager not responding so texted Dr Quinn Axe at 1341.   Dr Quinn Axe on camera at 1345. Cancelled code stroke at 1348 after getting report and speaking with EDP at bedside.

## 2022-04-06 DIAGNOSIS — H5310 Unspecified subjective visual disturbances: Secondary | ICD-10-CM | POA: Diagnosis not present

## 2022-04-06 DIAGNOSIS — H353112 Nonexudative age-related macular degeneration, right eye, intermediate dry stage: Secondary | ICD-10-CM | POA: Diagnosis not present

## 2022-04-06 DIAGNOSIS — H16101 Unspecified superficial keratitis, right eye: Secondary | ICD-10-CM | POA: Diagnosis not present

## 2022-04-13 DIAGNOSIS — H353112 Nonexudative age-related macular degeneration, right eye, intermediate dry stage: Secondary | ICD-10-CM | POA: Diagnosis not present

## 2022-04-13 DIAGNOSIS — H31001 Unspecified chorioretinal scars, right eye: Secondary | ICD-10-CM | POA: Diagnosis not present

## 2022-04-20 ENCOUNTER — Other Ambulatory Visit (HOSPITAL_BASED_OUTPATIENT_CLINIC_OR_DEPARTMENT_OTHER): Payer: Self-pay

## 2022-04-20 DIAGNOSIS — M1712 Unilateral primary osteoarthritis, left knee: Secondary | ICD-10-CM | POA: Diagnosis not present

## 2022-04-20 MED ORDER — TRAMADOL HCL 50 MG PO TABS
50.0000 mg | ORAL_TABLET | Freq: Four times a day (QID) | ORAL | 0 refills | Status: DC | PRN
  Filled 2022-04-20: qty 30, 8d supply, fill #0

## 2022-05-03 ENCOUNTER — Other Ambulatory Visit: Payer: Self-pay | Admitting: Nurse Practitioner

## 2022-05-03 DIAGNOSIS — Z1231 Encounter for screening mammogram for malignant neoplasm of breast: Secondary | ICD-10-CM

## 2022-05-15 DIAGNOSIS — H40053 Ocular hypertension, bilateral: Secondary | ICD-10-CM | POA: Diagnosis not present

## 2022-05-24 ENCOUNTER — Other Ambulatory Visit (HOSPITAL_BASED_OUTPATIENT_CLINIC_OR_DEPARTMENT_OTHER): Payer: Self-pay

## 2022-05-24 ENCOUNTER — Encounter: Payer: Self-pay | Admitting: Medical

## 2022-05-24 ENCOUNTER — Telehealth: Payer: Self-pay | Admitting: Medical

## 2022-05-24 ENCOUNTER — Telehealth (INDEPENDENT_AMBULATORY_CARE_PROVIDER_SITE_OTHER): Payer: PPO | Admitting: Medical

## 2022-05-24 VITALS — Ht 67.0 in | Wt 244.0 lb

## 2022-05-24 DIAGNOSIS — J019 Acute sinusitis, unspecified: Secondary | ICD-10-CM

## 2022-05-24 DIAGNOSIS — R059 Cough, unspecified: Secondary | ICD-10-CM | POA: Diagnosis not present

## 2022-05-24 DIAGNOSIS — H9201 Otalgia, right ear: Secondary | ICD-10-CM

## 2022-05-24 MED ORDER — AZITHROMYCIN 250 MG PO TABS
ORAL_TABLET | ORAL | 0 refills | Status: DC
  Filled 2022-05-24: qty 6, 5d supply, fill #0

## 2022-05-24 NOTE — Progress Notes (Signed)
Subjective:     Patient ID: Emily [name], female   DOB: November 20, 1950, 71 y.o.   MRN: [id number]  This visit type was conducted due to national recommendations for restrictions regarding the COVID-19 Pandemic (e.g. social distancing) in an effort to limit this patient's exposure and mitigate transmission in our community.  Due to their co-morbid illnesses, this patient is at least at moderate risk for complications without adequate follow up.  This format is felt to be most appropriate for this patient at this time.    Documentation for virtual audio and video telecommunications through [place] encounter:  The patient was located at home. The provider was located in the office. The patient did consent to this visit and is aware of possible charges through their insurance for this visit.  The other persons participating in this telemedicine service were none. Time spent on call was 20 minutes and in review of previous records 20 minutes total.  This virtual service is not related to other E/M service within previous 7 days.   HPI Chief Complaint  Patient presents with   other    Sinus infection, ear ache rt. Ear, stopped up on rt. Side, feel tired fatigued, body aches, cough, and ST rt. Side from drainage,    Virtual for illness.  She notes sinus infection.   She notes headache over right eye, ear feels like in a tunnel, right ear, has mild sore throat right side, fatigue, some cough, medium amount.  Some sneezing, some body aches.  Not sure about chills.  Possible fever yesterday.   No colored mucous, but sometimes gets a lot of production with blowing nose.   Using tylenol, benadryl, mucinex, and advil.  No sick contacts.  Has not done a covid test.  No other aggravating or relieving factors.  Gets similar sinus infection 1-2 times per year.  Usually in the past this clears up with zpak.   No other complaint.  Past Medical History:  Diagnosis Date   Acute bacterial sinusitis  06/29/2021   Acute non-recurrent ethmoidal sinusitis 08/05/2021   Allergy    seasonal    Anxiety    ARTHRALGIA 11/10/2009   Qualifier: Diagnosis of  By: Elease Hashimoto MD, Bruce     Cancer [facility]) 2009   squamous cell skin   Cataract    bilateral surgery, left unsuccessful   Cough 11/23/2020   Cystitis 02/12/2020   Depression    DYSPNEA 11/10/2009   Qualifier: Diagnosis of  By: Elease Hashimoto MD, Bruce     Eating disorder    GERD (gastroesophageal reflux disease)    Glaucoma    Head injury 07/23/2018   Hiatal hernia    History of cardiovascular stress test 04/2000   cardiolyte   History of flexible sigmoidoscopy 09/21/1999   Other fatigue 02/26/2020   Otitis of both ears 03/08/2020   Sinobronchitis 04/05/2021   WEIGHT GAIN 11/10/2009   Qualifier: Diagnosis of  By: Elease Hashimoto MD, Bruce     Current Outpatient Medications on File Prior to Visit  Medication Sig Dispense Refill   ALPRAZolam (XANAX) 1 MG tablet Take 1/2 to 1 tablet by mouth daily as needed (90 day supply) 45 tablet 0   aspirin 81 MG EC tablet Take 1 tablet by mouth twice daily for 2 weeks, then take 1 tablet by mouth once daily for 2 weeks 60 tablet 0   azelastine (OPTIVAR) 0.05 % ophthalmic solution Place 2 drops into both eyes 2 (two) times daily. 6 mL 11   Blood  Pressure Monitor MISC Use as directed 1 each 0   buPROPion (WELLBUTRIN XL) 150 MG 24 hr tablet Take 1 tablet by mouth every morning 90 tablet 4   chlorthalidone (HYGROTON) 25 MG tablet Take 1 tablet (25 mg total) by mouth daily. 30 tablet 12   Cholecalciferol (VITAMIN D3) 1.25 MG (50000 UT) CAPS Take 1 capsule by mouth weekly 4 capsule 12   cycloSPORINE (RESTASIS) 0.05 % ophthalmic emulsion Instill 1 drop into both eyes twice daily. 180 each PRN   dapagliflozin propanediol (FARXIGA) 10 MG TABS tablet Take 1 tablet (10 mg total) by mouth daily. 90 tablet 3   ergocalciferol (VITAMIN D2) 1.25 MG (50000 UT) capsule Take 1 capsule by mouth once a week.     traZODone (DESYREL)  100 MG tablet Take 300 mg by mouth at bedtime.     triamcinolone ointment (KENALOG) 0.1 % PLEASE SEE ATTACHED FOR DETAILED DIRECTIONS     dexamethasone (DECADRON) 4 MG tablet Take 1 tablet (4 mg total) by mouth daily. (Patient not taking: Reported on 05/24/2022) 6 tablet 0   diclofenac Sodium (VOLTAREN) 1 % GEL Apply 4 g topically 4 (four) times daily. (Patient not taking: Reported on 05/24/2022) 100 g 0   meclizine (ANTIVERT) 25 MG tablet Take 1 tablet (25 mg total) by mouth 3 (three) times daily as needed for dizziness. (Patient not taking: Reported on 05/24/2022) 90 tablet 3   ondansetron (ZOFRAN) 4 MG tablet Take 1 tablet by mouth every six to eight hours as needed (Patient not taking: Reported on 05/24/2022) 30 tablet 0   oxyCODONE (OXY IR/ROXICODONE) 5 MG immediate release tablet Take 1 tablet by mouth every four hours as needed for pain (Patient not taking: Reported on 05/24/2022) 30 tablet 0   ROCKLATAN 0.02-0.005 % SOLN Apply to eye. (Patient not taking: Reported on 05/24/2022)     tiZANidine (ZANAFLEX) 2 MG tablet Take 1 tablet (2 mg total) by mouth every 6-8 hours. (Patient not taking: Reported on 05/24/2022) 60 tablet 0   traMADol (ULTRAM) 50 MG tablet Take 1 tablet (50 mg total) by mouth every 6 (six) hours as needed for pain. (Patient not taking: Reported on 05/24/2022) 30 tablet 0   No current facility-administered medications on file prior to visit.    Review of Systems As in subjective    Objective:   Physical Exam Due to coronavirus pandemic stay at home measures, patient visit was virtual and they were not examined in person.   Ht '5\' 7"'$  (1.702 m)   Wt 244 lb (110.7 kg)   BMI 38.22 kg/m       Assessment:     Encounter Diagnoses  Name Primary?   Acute non-recurrent sinusitis, unspecified location Yes   Cough, unspecified type    Ear pain, right   Gen: wd, wn, nad Not particularly ill-appearing No labored breathing or dyspnea     Plan:     We discussed limitations of  virtual consult.  We discussed symptoms and concerns.  Advise she be good about rest, hydration, can continue the over-the-counter remedies including Mucinex and Tylenol.  I advise she had nasal saline flush and salt water gargles.  I advise she do a home COVID test.  If positive she need to call back as this would change the recommendations.  Otherwise begin medication below  Call or recheck if worse or not improving over the next 3 days  Alexea was seen today for other.  Diagnoses and all orders for this visit:  Acute non-recurrent sinusitis, unspecified location  Cough, unspecified type  Ear pain, right  Other orders -     azithromycin (ZITHROMAX) 250 MG tablet; 2 tablets day 1, then 1 tablet days 2-4  Follow-up as needed

## 2022-05-24 NOTE — Telephone Encounter (Signed)
Done

## 2022-05-24 NOTE — Telephone Encounter (Signed)
-----   Message from Carlena Hurl, PA-C sent at 05/24/2022  8:33 AM EST ----- Please write out of work note for today, illness.   Email her please

## 2022-05-25 ENCOUNTER — Other Ambulatory Visit (HOSPITAL_BASED_OUTPATIENT_CLINIC_OR_DEPARTMENT_OTHER): Payer: Self-pay

## 2022-06-13 ENCOUNTER — Encounter: Payer: Self-pay | Admitting: Nurse Practitioner

## 2022-06-13 ENCOUNTER — Other Ambulatory Visit: Payer: Self-pay

## 2022-06-13 ENCOUNTER — Telehealth (INDEPENDENT_AMBULATORY_CARE_PROVIDER_SITE_OTHER): Payer: PPO | Admitting: Nurse Practitioner

## 2022-06-13 ENCOUNTER — Other Ambulatory Visit (HOSPITAL_BASED_OUTPATIENT_CLINIC_OR_DEPARTMENT_OTHER): Payer: Self-pay

## 2022-06-13 VITALS — Ht 66.5 in | Wt 240.0 lb

## 2022-06-13 DIAGNOSIS — J0120 Acute ethmoidal sinusitis, unspecified: Secondary | ICD-10-CM

## 2022-06-13 MED ORDER — HYDROCODONE BIT-HOMATROP MBR 5-1.5 MG/5ML PO SOLN
5.0000 mL | Freq: Every evening | ORAL | 0 refills | Status: DC | PRN
  Filled 2022-06-13: qty 60, 12d supply, fill #0

## 2022-06-13 MED ORDER — PSEUDOEPH-BROMPHEN-DM 30-2-10 MG/5ML PO SYRP
5.0000 mL | ORAL_SOLUTION | Freq: Three times a day (TID) | ORAL | 0 refills | Status: DC | PRN
  Filled 2022-06-13: qty 120, 8d supply, fill #0

## 2022-06-13 MED ORDER — FLUCONAZOLE 150 MG PO TABS
ORAL_TABLET | ORAL | 2 refills | Status: DC
  Filled 2022-06-13: qty 2, 3d supply, fill #0

## 2022-06-13 MED ORDER — AZITHROMYCIN 250 MG PO TABS
ORAL_TABLET | ORAL | 0 refills | Status: DC
  Filled 2022-06-13: qty 11, 5d supply, fill #0

## 2022-06-13 NOTE — Patient Instructions (Addendum)
I have sent in the azithromycin and two different kinds of cough medications. I want you to let me know if you are not feeling better by Friday.   Keep taking tylenol and ibuprofen alternating every 4 -6 hours until you are no longer having aches and chills.   If anything changes, please let me know.

## 2022-06-13 NOTE — Progress Notes (Incomplete)
Virtual Visit Encounter mychart visit.   I connected with  Emily [name] on 06/14/22 at  9:30 AM EST by secure video and audio telemedicine application. I verified that I am speaking with the correct person using two identifiers.   I introduced myself as a Designer, jewellery with the practice. The limitations of evaluation and management by telemedicine discussed with the patient and the availability of in person appointments. The patient expressed verbal understanding and consent to proceed.  Participating parties in this visit include: Myself and patient and her husband, Emily [name]  The patient is: Patient Location: Home I am: Provider Location: Office/Clinic Subjective:    CC and HPI: Emily [name] is a 71 y.o. year old female presenting for new evaluation and treatment of URI. Patient reports the following: Sinus pain and pressure, fatigue, cough (productive), sneezing, and muscle aches that started about 5 days age. She has been taking over the counter medication, but her symptoms are getting worse. 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 No other household members are sick. She is not sure if she is having fevers, but is having chills.   Past medical history, Surgical history, Family history not pertinant except as noted below, Social history, Allergies, and medications have been entered into the medical record, reviewed, and corrections made.   Review of Systems:  All review of systems negative except what is listed in the HPI  Objective:    Alert and oriented x 4 Audibly congested with loud, harsh, wet sounding cough.  Speaking in clear sentences with no shortness of breath. No distress.  Impression and Recommendations:    Problem List Items Addressed This Visit     Subacute ethmoidal sinusitis - Primary    Symptoms of sinus pain and pressure with cough for over 5 days. Suspect this started as viral etiology and secondary bacterial  infection has taken over given the symptoms and course of illness. She does have a history of difficult to treat sinusitis. We will start antibiotics and steroids today. Monitor closely for new or worsening symptoms and follow-up if symptoms change or do not improve with completion of therapy.       Relevant Medications   azithromycin (ZITHROMAX) 250 MG tablet   brompheniramine-pseudoephedrine-DM 30-2-10 MG/5ML syrup   HYDROcodone bit-homatropine (HYCODAN) 5-1.5 MG/5ML syrup   fluconazole (DIFLUCAN) 150 MG tablet    orders and follow up as documented in EMR I discussed the assessment and treatment plan with the patient. The patient was provided an opportunity to ask questions and all were answered. The patient agreed with the plan and demonstrated an understanding of the instructions.   The patient was advised to call back or seek an in-person evaluation if the symptoms worsen or if the condition fails to improve as anticipated.  Follow-Up: if symptoms still present with completion of antibiotics.   I provided 20 minutes of non-face-to-face interaction with this non face-to-face encounter including intake, same-day documentation, and chart review.   Orma Render, NP , DNP, AGNP-c [place] at [facility] [phone] (fax)

## 2022-06-14 ENCOUNTER — Encounter: Payer: Self-pay | Admitting: Nurse Practitioner

## 2022-06-14 NOTE — Assessment & Plan Note (Signed)
Symptoms of sinus pain and pressure with cough for over 5 days. Suspect this started as viral etiology and secondary bacterial infection has taken over given the symptoms and course of illness. She does have a history of difficult to treat sinusitis. We will start antibiotics and steroids today. Monitor closely for new or worsening symptoms and follow-up if symptoms change or do not improve with completion of therapy.

## 2022-06-16 ENCOUNTER — Other Ambulatory Visit: Payer: Self-pay

## 2022-06-16 ENCOUNTER — Encounter: Payer: Self-pay | Admitting: Nurse Practitioner

## 2022-06-16 ENCOUNTER — Other Ambulatory Visit (HOSPITAL_BASED_OUTPATIENT_CLINIC_OR_DEPARTMENT_OTHER): Payer: Self-pay

## 2022-06-16 ENCOUNTER — Telehealth (INDEPENDENT_AMBULATORY_CARE_PROVIDER_SITE_OTHER): Payer: PPO | Admitting: Nurse Practitioner

## 2022-06-16 VITALS — Wt 240.0 lb

## 2022-06-16 DIAGNOSIS — J0120 Acute ethmoidal sinusitis, unspecified: Secondary | ICD-10-CM | POA: Diagnosis not present

## 2022-06-16 DIAGNOSIS — J111 Influenza due to unidentified influenza virus with other respiratory manifestations: Secondary | ICD-10-CM

## 2022-06-16 MED ORDER — OSELTAMIVIR PHOSPHATE 75 MG PO CAPS
75.0000 mg | ORAL_CAPSULE | Freq: Two times a day (BID) | ORAL | 0 refills | Status: DC
  Filled 2022-06-16: qty 10, 5d supply, fill #0

## 2022-06-16 MED ORDER — HYDROCODONE BIT-HOMATROP MBR 5-1.5 MG/5ML PO SOLN
5.0000 mL | Freq: Every evening | ORAL | 0 refills | Status: DC | PRN
  Filled 2022-06-16: qty 60, 12d supply, fill #0

## 2022-06-16 MED ORDER — PREDNISONE 20 MG PO TABS
40.0000 mg | ORAL_TABLET | Freq: Every day | ORAL | 0 refills | Status: DC
  Filled 2022-06-16: qty 10, 5d supply, fill #0

## 2022-06-16 MED ORDER — BENZONATATE 200 MG PO CAPS
200.0000 mg | ORAL_CAPSULE | Freq: Three times a day (TID) | ORAL | 0 refills | Status: DC | PRN
  Filled 2022-06-16: qty 45, 15d supply, fill #0

## 2022-06-16 MED ORDER — CEFDINIR 300 MG PO CAPS
300.0000 mg | ORAL_CAPSULE | Freq: Two times a day (BID) | ORAL | 0 refills | Status: DC
  Filled 2022-06-16: qty 10, 5d supply, fill #0

## 2022-06-16 MED ORDER — HYDROCODONE BIT-HOMATROP MBR 5-1.5 MG/5ML PO SOLN
5.0000 mL | Freq: Four times a day (QID) | ORAL | 0 refills | Status: DC | PRN
  Filled 2022-06-16: qty 120, 6d supply, fill #0

## 2022-06-16 NOTE — Progress Notes (Incomplete)
Virtual Visit Encounter {telephone/mychart:25033} visit.   I connected with  Emily [name] on 06/16/22 at  3:15 PM EST by secure {Video Enabled:26378::"video and audio"} telemedicine application. I verified that I am speaking with the correct person using two identifiers.   I introduced myself as a Designer, jewellery with the practice. The limitations of evaluation and management by telemedicine discussed with the patient and the availability of in person appointments. The patient expressed verbal understanding and consent to proceed.  Participating parties in this visit include: Myself and {Relatives of adult:5061}  The patient is: {Patient [phone]::"Home"} I am: {Provider Location:[phone]::"Home Office"} Subjective:    CC and HPI: Emily [name] is a 71 y.o. year old female presenting for {New/followup:15353} ***. Patient reports the following:  Past medical history, Surgical history, Family history not pertinant except as noted below, Social history, Allergies, and medications have been entered into the medical record, reviewed, and corrections made.   Review of Systems:  All review of systems negative except what is listed in the HPI  Objective:    Alert and oriented x *** Speaking in clear sentences with no shortness of breath. No distress.  Impression and Recommendations:    Problem List Items Addressed This Visit   None   {disease follow up plans:315751} I discussed the assessment and treatment plan with the patient. The patient was provided an opportunity to ask questions and all were answered. The patient agreed with the plan and demonstrated an understanding of the instructions.   The patient was advised to call back or seek an in-person evaluation if the symptoms worsen or if the condition fails to improve as anticipated.  Follow-Up: {plan; follow-up:11812}  I provided *** minutes of non-face-to-face interaction with this non  face-to-face encounter including intake, same-day documentation, and chart review.   Orma Render, NP , DNP, AGNP-c [place] at [facility] [phone] (fax)

## 2022-06-17 ENCOUNTER — Other Ambulatory Visit (HOSPITAL_BASED_OUTPATIENT_CLINIC_OR_DEPARTMENT_OTHER): Payer: Self-pay

## 2022-06-21 ENCOUNTER — Encounter: Payer: Self-pay | Admitting: Nurse Practitioner

## 2022-06-21 NOTE — Assessment & Plan Note (Signed)
Based on her presentation and lack of improvement with regular sinusitis treatment, I am concerned that this may be influenza. Will send treatment today for her and her husband with tamiflu. Will also send treatment with steroid burst as she typically needs this when her infection gets into her chest as this has. I do not feel it is necessary to bring her into the office for testing at this time as she is feeling quite ill and in bed. I will also send in a course of cefdinir given that historically infections that start in her chest turn into bacterial bronchitis requiring extensive long term treatment. I am hopeful that we can avoid this by providing her with the antibiotic to start immediately if her initial symptoms begin to improve then worse again. She will follow-up next week if there is no improvement.

## 2022-06-28 ENCOUNTER — Encounter: Payer: Self-pay | Admitting: Internal Medicine

## 2022-06-29 ENCOUNTER — Other Ambulatory Visit (HOSPITAL_BASED_OUTPATIENT_CLINIC_OR_DEPARTMENT_OTHER): Payer: Self-pay

## 2022-06-30 ENCOUNTER — Other Ambulatory Visit (HOSPITAL_BASED_OUTPATIENT_CLINIC_OR_DEPARTMENT_OTHER): Payer: Self-pay

## 2022-06-30 MED ORDER — ROCKLATAN 0.02-0.005 % OP SOLN
OPHTHALMIC | 11 refills | Status: DC
  Filled 2022-06-30: qty 2.5, 25d supply, fill #0
  Filled 2022-08-02: qty 2.5, 25d supply, fill #1
  Filled 2022-09-12 (×2): qty 2.5, 25d supply, fill #2
  Filled 2022-09-16 – 2022-12-15 (×2): qty 2.5, 25d supply, fill #3
  Filled 2023-01-23: qty 2.5, 25d supply, fill #4
  Filled 2023-03-20: qty 2.5, 25d supply, fill #5
  Filled 2023-04-25: qty 2.5, 25d supply, fill #6
  Filled 2023-05-28: qty 2.5, 25d supply, fill #7

## 2022-07-04 ENCOUNTER — Encounter: Payer: Self-pay | Admitting: Nurse Practitioner

## 2022-07-04 ENCOUNTER — Telehealth (INDEPENDENT_AMBULATORY_CARE_PROVIDER_SITE_OTHER): Payer: PPO | Admitting: Nurse Practitioner

## 2022-07-04 ENCOUNTER — Other Ambulatory Visit (HOSPITAL_BASED_OUTPATIENT_CLINIC_OR_DEPARTMENT_OTHER): Payer: Self-pay

## 2022-07-04 ENCOUNTER — Ambulatory Visit: Payer: PPO

## 2022-07-04 VITALS — BP 132/90 | Ht 66.5 in | Wt 245.0 lb

## 2022-07-04 DIAGNOSIS — J0111 Acute recurrent frontal sinusitis: Secondary | ICD-10-CM | POA: Diagnosis not present

## 2022-07-04 DIAGNOSIS — R052 Subacute cough: Secondary | ICD-10-CM | POA: Diagnosis not present

## 2022-07-04 MED ORDER — FLUTICASONE-SALMETEROL 100-50 MCG/ACT IN AEPB
1.0000 | INHALATION_SPRAY | Freq: Two times a day (BID) | RESPIRATORY_TRACT | 3 refills | Status: DC
  Filled 2022-07-04: qty 60, 30d supply, fill #0

## 2022-07-04 MED ORDER — HYDROCODONE BIT-HOMATROP MBR 5-1.5 MG/5ML PO SOLN: 5.0000 mL | Freq: Three times a day (TID) | ORAL | 0 refills | Status: DC | PRN

## 2022-07-04 MED ORDER — HYDROCODONE BIT-HOMATROP MBR 5-1.5 MG/5ML PO SOLN
5.0000 mL | Freq: Three times a day (TID) | ORAL | 0 refills | Status: DC | PRN
  Filled 2022-07-04: qty 60, 4d supply, fill #0

## 2022-07-04 MED ORDER — LEVOFLOXACIN 500 MG PO TABS
500.0000 mg | ORAL_TABLET | Freq: Every day | ORAL | 0 refills | Status: AC
  Filled 2022-07-04: qty 7, 7d supply, fill #0

## 2022-07-04 NOTE — Addendum Note (Signed)
Addended by: Francis Yardley, Clarise Cruz E on: 07/04/2022 02:12 PM   Modules accepted: Orders

## 2022-07-04 NOTE — Assessment & Plan Note (Addendum)
Symptoms consistent with secondary bacterial infection following influenza. Emily [name] has a history of difficult to treat sinus infections requiring multiple antibiotic therapy. She has recently completed azithromycin. Given her current symptoms and history, I do feel that we need to aggressively treat her to help prevent significant worsening of symptoms that has been seen in the past. We will start antibiotic therapy with levofloxacin and inhaler therapy with advair. Will plan to follow-up if no improvement in the next 2 days. Work note provided.

## 2022-07-04 NOTE — Progress Notes (Signed)
Virtual Visit Encounter mychart visit.   I connected with  Candise Crabtree on 07/04/22 at 11:30 AM EST by secure video and audio telemedicine application. I verified that I am speaking with the correct person using two identifiers.   I introduced myself as a Designer, jewellery with the practice. The limitations of evaluation and management by telemedicine discussed with the patient and the availability of in person appointments. The patient expressed verbal understanding and consent to proceed.  Participating parties in this visit include: Myself and patient  The patient is: Patient Location: Home I am: Provider Location: Office/Clinic Subjective:    CC and HPI: Theola Cuellar is a 72 y.o. year old female presenting for follow up of cough. Patient reports the following: Blimie recently was diagnosed with influenza. She completed a course of tamiflu for this and was also on antibiotics prior for sinusitis. She tells em that her symptoms were improving, but Sunday she noticed that her cough started to get worse and and she has started to have body aches and fatigue. She reports sinus pressure above her eyes and she is having some shortness of breath with exertion. She tells me on Sunday while at work she was unable to walk more than 25-30 ft before having to stop to catch her breath. She was off of work yesterday, but has stayed home sick today and rested. She does not think she has a fever right now.   Past medical history, Surgical history, Family history not pertinant except as noted below, Social history, Allergies, and medications have been entered into the medical record, reviewed, and corrections made.   Review of Systems:  All review of systems negative except what is listed in the HPI  Objective:    Alert and oriented x 4 Speaking in clear sentences with no shortness of breath. No distress.  Impression and Recommendations:    Problem List Items Addressed This Visit      Acute recurrent frontal sinusitis - Primary    Symptoms consistent with secondary bacterial infection following influenza. Vanesa has a history of difficult to treat sinus infections requiring multiple antibiotic therapy. She has recently completed azithromycin. Given her current symptoms and history, I do feel that we need to aggressively treat her to help prevent significant worsening of symptoms that has been seen in the past. We will start antibiotic therapy with levofloxacin and inhaler therapy with advair. Will plan to follow-up if no improvement in the next 2 days. Work note provided.       Relevant Medications   guaiFENesin (MUCINEX) 600 MG 12 hr tablet   diphenhydrAMINE (BENADRYL) 25 MG tablet   levofloxacin (LEVAQUIN) 500 MG tablet   HYDROcodone bit-homatropine (HYCODAN) 5-1.5 MG/5ML syrup   Subacute cough    Cough with mucopurulent sputum production likely bacterial following viral illness. Given her symptoms and history of prolonged illness and difficult to treat URI, will will start treatment with levofloxacin and advair. She may need to stay on advair for at least a month or more. She will follow-up if her symptoms fail to improve with treatment completion.       Relevant Medications   levofloxacin (LEVAQUIN) 500 MG tablet   HYDROcodone bit-homatropine (HYCODAN) 5-1.5 MG/5ML syrup   fluticasone-salmeterol (ADVAIR) 100-50 MCG/ACT AEPB    orders and follow up as documented in EMR I discussed the assessment and treatment plan with the patient. The patient was provided an opportunity to ask questions and all were answered. The patient agreed with the  plan and demonstrated an understanding of the instructions.   The patient was advised to call back or seek an in-person evaluation if the symptoms worsen or if the condition fails to improve as anticipated.  Follow-Up: prn  I provided 17 minutes of non-face-to-face interaction with this non face-to-face encounter including intake,  same-day documentation, and chart review.   Orma Render, NP , DNP, AGNP-c [facility]

## 2022-07-04 NOTE — Assessment & Plan Note (Signed)
Cough with mucopurulent sputum production likely bacterial following viral illness. Given her symptoms and history of prolonged illness and difficult to treat URI, will will start treatment with levofloxacin and advair. She may need to stay on advair for at least a month or more. She will follow-up if her symptoms fail to improve with treatment completion.

## 2022-07-06 ENCOUNTER — Ambulatory Visit (INDEPENDENT_AMBULATORY_CARE_PROVIDER_SITE_OTHER): Payer: PPO | Admitting: Nurse Practitioner

## 2022-07-06 ENCOUNTER — Encounter: Payer: Self-pay | Admitting: Nurse Practitioner

## 2022-07-06 ENCOUNTER — Other Ambulatory Visit (HOSPITAL_BASED_OUTPATIENT_CLINIC_OR_DEPARTMENT_OTHER): Payer: Self-pay

## 2022-07-06 VITALS — BP 130/82 | HR 78 | Temp 98.0°F | Wt 251.2 lb

## 2022-07-06 DIAGNOSIS — R052 Subacute cough: Secondary | ICD-10-CM | POA: Diagnosis not present

## 2022-07-06 DIAGNOSIS — R52 Pain, unspecified: Secondary | ICD-10-CM | POA: Diagnosis not present

## 2022-07-06 DIAGNOSIS — R0602 Shortness of breath: Secondary | ICD-10-CM | POA: Diagnosis not present

## 2022-07-06 DIAGNOSIS — I10 Essential (primary) hypertension: Secondary | ICD-10-CM

## 2022-07-06 MED ORDER — KETOROLAC TROMETHAMINE 60 MG/2ML IM SOLN
60.0000 mg | Freq: Once | INTRAMUSCULAR | Status: AC
  Administered 2022-07-06: 60 mg via INTRAMUSCULAR

## 2022-07-06 MED ORDER — METHYLPREDNISOLONE ACETATE 80 MG/ML IJ SUSP
80.0000 mg | Freq: Once | INTRAMUSCULAR | Status: AC
  Administered 2022-07-06: 80 mg via INTRAMUSCULAR

## 2022-07-06 MED ORDER — CEFTRIAXONE SODIUM 1 G IJ SOLR
1.0000 g | Freq: Once | INTRAMUSCULAR | Status: AC
  Administered 2022-07-06: 1 g via INTRAMUSCULAR

## 2022-07-06 MED ORDER — PREDNISONE 20 MG PO TABS
ORAL_TABLET | ORAL | 0 refills | Status: DC
  Filled 2022-07-06: qty 13, 7d supply, fill #0

## 2022-07-06 NOTE — Patient Instructions (Addendum)
I want you to continue the Levofloxacin. We have given you rocephin, toradol, and steroid shot today in the office. I want you to hold the advair and start the trelegy for the next 2 weeks. This is once a day.   If you get worse over the weekend, please go to the emergency room.

## 2022-07-06 NOTE — Progress Notes (Signed)
  Orma Render, DNP, AGNP-c [facility] [address] [place], [place] [address] [phone]  Subjective:   Emily [name] is a 72 y.o. female presents to day for evaluation of: URI Symptoms Christia was seen earlier this week for symptoms of cough, congestion, shortness of breath, fatigue, and weakness. She was started on medication, but despite several doses, she is not having any improvement of her symptoms. She tells me she is still having significant shortness of breath, which is her main concern at this time. At times she is also having some dizziness. She tells me she is so weak she has barely been able to get out of bed. She does not feel she has been running any fevers since starting the medication.   PMH, Medications, and Allergies reviewed and updated in chart as appropriate.   ROS negative except for what is listed in HPI. Objective:  BP 130/82   Pulse 78   Temp 98 F (36.7 C)   Wt 251 lb 3.2 oz (113.9 kg)   SpO2 97%   BMI 39.94 kg/m  Physical Exam Vitals and nursing note reviewed.  Constitutional:      Appearance: She is ill-appearing.  HENT:     Head: Normocephalic and atraumatic.     Nose: Congestion and rhinorrhea present.     Mouth/Throat:     Pharynx: Posterior oropharyngeal erythema present. No oropharyngeal exudate.  Eyes:     Extraocular Movements: Extraocular movements intact.     Pupils: Pupils are equal, round, and reactive to light.  Cardiovascular:     Rate and Rhythm: Normal rate and regular rhythm.     Pulses: Normal pulses.     Heart sounds: Normal heart sounds.  Pulmonary:     Breath sounds: Wheezing present.  Abdominal:     Palpations: Abdomen is soft.  Lymphadenopathy:     Cervical: Cervical adenopathy present.  Skin:    General: Skin is warm and dry.     Capillary Refill: Capillary refill takes less than 2 seconds.  Neurological:     General: No focal deficit present.     Mental Status: She is alert and oriented  to person, place, and time.     Motor: Weakness present.  Psychiatric:        Mood and Affect: Mood normal.        Behavior: Behavior normal.           Assessment & Plan:   Problem List Items Addressed This Visit     Subacute cough   Relevant Medications   cefTRIAXone (ROCEPHIN) injection 1 g (Start on 07/06/2022 12:15 PM)   methylPREDNISolone acetate (DEPO-MEDROL) injection 80 mg (Start on 07/06/2022 12:15 PM)   predniSONE (DELTASONE) 20 MG tablet   Other Relevant Orders   DG Chest 2 View   Other Visit Diagnoses     Shortness of breath    -  Primary   Relevant Medications   cefTRIAXone (ROCEPHIN) injection 1 g (Start on 07/06/2022 12:15 PM)   methylPREDNISolone acetate (DEPO-MEDROL) injection 80 mg (Start on 07/06/2022 12:15 PM)   predniSONE (DELTASONE) 20 MG tablet   Other Relevant Orders   CBC with Differential/Platelet   Comprehensive metabolic panel   D-dimer, quantitative   DG Chest 2 View         Orma Render, DNP, AGNP-c 07/06/2022  12:07 PM    History, Medications, Surgery, SDOH, and Family History reviewed and updated as appropriate.

## 2022-07-07 LAB — COMPREHENSIVE METABOLIC PANEL
ALT: 27 IU/L (ref 0–32)
AST: 20 IU/L (ref 0–40)
Albumin/Globulin Ratio: 1.8 (ref 1.2–2.2)
Albumin: 4 g/dL (ref 3.8–4.8)
Alkaline Phosphatase: 56 IU/L (ref 44–121)
BUN/Creatinine Ratio: 20 (ref 12–28)
BUN: 18 mg/dL (ref 8–27)
Bilirubin Total: 0.2 mg/dL (ref 0.0–1.2)
CO2: 24 mmol/L (ref 20–29)
Calcium: 9.8 mg/dL (ref 8.7–10.3)
Chloride: 103 mmol/L (ref 96–106)
Creatinine, Ser: 0.91 mg/dL (ref 0.57–1.00)
Globulin, Total: 2.2 g/dL (ref 1.5–4.5)
Glucose: 93 mg/dL (ref 70–99)
Potassium: 4.3 mmol/L (ref 3.5–5.2)
Sodium: 141 mmol/L (ref 134–144)
Total Protein: 6.2 g/dL (ref 6.0–8.5)
eGFR: 67 mL/min/{1.73_m2} (ref >59)

## 2022-07-07 LAB — CBC WITH DIFFERENTIAL/PLATELET
Basophils Absolute: 0 10*3/uL (ref 0.0–0.2)
Basos: 0 %
EOS (ABSOLUTE): 0.2 10*3/uL (ref 0.0–0.4)
Eos: 3 %
Hematocrit: 42.9 % (ref 34.0–46.6)
Hemoglobin: 14.5 g/dL (ref 11.1–15.9)
Immature Grans (Abs): 0 10*3/uL (ref 0.0–0.1)
Immature Granulocytes: 0 %
Lymphocytes Absolute: 1.3 10*3/uL (ref 0.7–3.1)
Lymphs: 24 %
MCH: 31.3 pg (ref 26.6–33.0)
MCHC: 33.8 g/dL (ref 31.5–35.7)
MCV: 93 fL (ref 79–97)
Monocytes Absolute: 0.5 10*3/uL (ref 0.1–0.9)
Monocytes: 10 %
Neutrophils Absolute: 3.3 10*3/uL (ref 1.4–7.0)
Neutrophils: 63 %
Platelets: 276 10*3/uL (ref 150–450)
RBC: 4.63 x10E6/uL (ref 3.77–5.28)
RDW: 12.7 % (ref 11.7–15.4)
WBC: 5.2 10*3/uL (ref 3.4–10.8)

## 2022-07-07 LAB — D-DIMER, QUANTITATIVE: D-DIMER: 0.56 mg/L FEU — ABNORMAL HIGH (ref 0.00–0.49)

## 2022-07-10 ENCOUNTER — Encounter: Payer: Self-pay | Admitting: Nurse Practitioner

## 2022-07-10 ENCOUNTER — Telehealth (INDEPENDENT_AMBULATORY_CARE_PROVIDER_SITE_OTHER): Payer: PPO | Admitting: Nurse Practitioner

## 2022-07-10 VITALS — Wt 250.0 lb

## 2022-07-10 DIAGNOSIS — J0120 Acute ethmoidal sinusitis, unspecified: Secondary | ICD-10-CM

## 2022-07-10 DIAGNOSIS — J0111 Acute recurrent frontal sinusitis: Secondary | ICD-10-CM | POA: Diagnosis not present

## 2022-07-10 NOTE — Patient Instructions (Signed)
I

## 2022-07-10 NOTE — Assessment & Plan Note (Addendum)
On going, but somewhat improving symptoms of congestion and cough with severe fatigue. Unfortunately, she has been sick for nearly 6 weeks at this point, therefore, her body is very likely just taking longer than expected to recover. We discussed the importance of completing the antibiotic and continuing with the steroid until complete. She is no longer running a fever, which is promising and her cough is improving. I recommend giving it more time. I will write her out of work for the remainder of the week to allow for recovery. We will plan to follow up if she is not improved into next week.

## 2022-07-10 NOTE — Progress Notes (Signed)
Virtual Visit Encounter mychart visit.   I connected with  Emily [name] on 07/10/22 at  1:30 PM EST by secure video and audio telemedicine application. I verified that I am speaking with the correct person using two identifiers.   I introduced myself as a Designer, jewellery with the practice. The limitations of evaluation and management by telemedicine discussed with the patient and the availability of in person appointments. The patient expressed verbal understanding and consent to proceed.  Participating parties in this visit include: Myself and patient  The patient is: Patient Location: Home I am: Provider Location: Office/Clinic Subjective:    CC and HPI: Emily [name] is a 72 y.o. year old female presenting for follow up of URI. Patient reports the following: Emily [name] reports she is still not feeling well from her recent infection. She tells me she has no energy at all and wears out after being up for only about 30 minutes. This is an improvement, however, because in past weeks she was not able to get up and around at all. She tells me the cough and congestion are improving, so she does feel there is a difference with that.   Past medical history, Surgical history, Family history not pertinant except as noted below, Social history, Allergies, and medications have been entered into the medical record, reviewed, and corrections made.   Review of Systems:  All review of systems negative except what is listed in the HPI  Objective:    Alert and oriented x 4 Laying in bed. She is ill appearing. Speaking in clear sentences with no shortness of breath. No distress.  Impression and Recommendations:    Problem List Items Addressed This Visit     Acute recurrent frontal sinusitis    On going, but somewhat improving symptoms of congestion and cough with severe fatigue. Unfortunately, she has been sick for nearly 6 weeks at this point, therefore, her body is very likely just  taking longer than expected to recover. We discussed the importance of completing the antibiotic and continuing with the steroid until complete. She is no longer running a fever, which is promising and her cough is improving. I recommend giving it more time. I will write her out of work for the remainder of the week to allow for recovery. We will plan to follow up if she is not improved into next week.       RESOLVED: Subacute ethmoidal sinusitis - Primary    orders and follow up as documented in EMR I discussed the assessment and treatment plan with the patient. The patient was provided an opportunity to ask questions and all were answered. The patient agreed with the plan and demonstrated an understanding of the instructions.   The patient was advised to call back or seek an in-person evaluation if the symptoms worsen or if the condition fails to improve as anticipated.  Follow-Up: prn  I provided 22 minutes of non-face-to-face interaction with this non face-to-face encounter including intake, same-day documentation, and chart review.   Orma Render, NP , DNP, AGNP-c [facility]

## 2022-07-15 NOTE — Assessment & Plan Note (Signed)
Cough, congestion, wheezing, and shortness of breath present with suspected pneumonia infection. She is very fatigued and weak presenting today, which is concerning. Her lungs are not rhonchus, which is reassuring. She has not been on steroids for this illness and at this point I feel that is our most appropriate step given her lack of response to antibiotics. We will go ahead and start with steroid injection along with rochephin and toradol today and continue current oral antibiotic. Will also send in prednisone to begin tomorrow. I encourage rest, hydration, and avoidance of public while she is ill. I have ordered CXR for her today to determine if further steps are warranted.

## 2022-07-15 NOTE — Assessment & Plan Note (Signed)
Blood pressure slightly elevated at visit today, but no alarming symptoms present. Likely related to current illness. Recommend continue with diet and exercise, once infection has improved. Continue current medication.

## 2022-07-20 ENCOUNTER — Encounter: Payer: Self-pay | Admitting: Nurse Practitioner

## 2022-07-20 DIAGNOSIS — R0609 Other forms of dyspnea: Secondary | ICD-10-CM

## 2022-07-20 DIAGNOSIS — R052 Subacute cough: Secondary | ICD-10-CM

## 2022-07-20 DIAGNOSIS — R4189 Other symptoms and signs involving cognitive functions and awareness: Secondary | ICD-10-CM

## 2022-07-21 ENCOUNTER — Encounter: Payer: Self-pay | Admitting: Nurse Practitioner

## 2022-07-21 ENCOUNTER — Telehealth (INDEPENDENT_AMBULATORY_CARE_PROVIDER_SITE_OTHER): Payer: PPO | Admitting: Nurse Practitioner

## 2022-07-21 ENCOUNTER — Other Ambulatory Visit (HOSPITAL_BASED_OUTPATIENT_CLINIC_OR_DEPARTMENT_OTHER): Payer: Self-pay

## 2022-07-21 DIAGNOSIS — R0602 Shortness of breath: Secondary | ICD-10-CM

## 2022-07-21 DIAGNOSIS — R052 Subacute cough: Secondary | ICD-10-CM

## 2022-07-21 MED ORDER — PREDNISONE 20 MG PO TABS
ORAL_TABLET | ORAL | 0 refills | Status: DC
  Filled 2022-07-21: qty 13, 7d supply, fill #0

## 2022-07-21 NOTE — Progress Notes (Signed)
Virtual Visit Encounter mychart visit.   I connected with  [name] on 07/29/22 at 10:30 AM EST by secure video and audio telemedicine application. I verified that I am speaking with the correct person using two identifiers.   I introduced myself as a Designer, jewellery with the practice. The limitations of evaluation and management by telemedicine discussed with the patient and the availability of in person appointments. The patient expressed verbal understanding and consent to proceed.  Participating parties in this visit include: Myself and patient  The patient is: Patient Location: Home I am: Provider Location: Office/Clinic Subjective:    CC and HPI: Emily [name] is a 72 y.o. year old female presenting for follow up of shortness of breath and URI. Patient reports the following: Emily [name] tells me that since she was last seen her symptoms have not improved much at all. She tells me she did go to work one day this week, but after a six hour shift and eating dinner she was completely wiped out to the point that she was unable to return to work the following day. She tells me she is still experiencing significant fatigue and shortness of breath both at rest and with exertion. She was diagnosed with influenza on 12/29 and has never fully recovered with symptoms of sinusitis and shortness of breath ongoing since that time.    Past medical history, Surgical history, Family history not pertinant except as noted below, Social history, Allergies, and medications have been entered into the medical record, reviewed, and corrections made.   Review of Systems:  All review of systems negative except what is listed in the HPI  Objective:    Alert and oriented x 4 She is short of breath at rest, but able to speak in complete sentences.  She appears ill.  No distress at this time.  Impression and Recommendations:    Problem List Items Addressed This Visit     Subacute cough     See shortness of breath.       Relevant Medications   predniSONE (DELTASONE) 20 MG tablet   Shortness of breath    Orell is experiencing ongoing fatigue, congestion, and dyspnea with exertion and at rest following influenza infection about 5 weeks ago. She has been treated with tamiflu, antibiotics, steroids, and inhalers for the problem. She has also been taken out of work for rest for several days, however, her symptoms still continue. At this time, I am concerned for possible pneumonia. I have ordered chest x-ray, but she has not been able to have this completed yet. I do encourage her to have this done as soon as possible so we can get a better evaluation of her lungs. At this time, we will plan to trial another course of prednisone to see if we can help with inflammation and improve her respiratory status. We will also plan to obtain a d-dimer to evaluate for possible presence of PE. We will continue to monitor closely. I have encouraged her to rest as much as possible and ensure that she is staying well hydrated. She has concerns for missing additional work. She is scheduled to work only 9-1 Saturday and Sunday and would like to try this. We have discussed that if this shift is tolerable we can consider allowing her to return to work on a part time basis and she is in agreement to this. She will let me know how she is feeling after the weekend shifts and if she feels she  can return to work on Tuesday.       Relevant Medications   predniSONE (DELTASONE) 20 MG tablet    orders and follow up as documented in EMR I discussed the assessment and treatment plan with the patient. The patient was provided an opportunity to ask questions and all were answered. The patient agreed with the plan and demonstrated an understanding of the instructions.   The patient was advised to call back or seek an in-person evaluation if the symptoms worsen or if the condition fails to improve as anticipated.  Follow-Up:  Monday  I provided 73 minutes of non-face-to-face interaction with this non face-to-face encounter including intake, same-day documentation, and chart review.   Orma Render, NP , DNP, AGNP-c [facility]

## 2022-07-21 NOTE — Patient Instructions (Addendum)
PLAN:  Work 9-2 tomorrow and Sunday  TAKE IT EASY. Please sit as much as possible  Send me a message Monday to let me know how you felt only working the short shift.   If you tolerated well, we will plan to keep you 9-1 for at least 2 weeks.

## 2022-07-25 ENCOUNTER — Ambulatory Visit
Admission: RE | Admit: 2022-07-25 | Discharge: 2022-07-25 | Disposition: A | Discharge status: Home / Self Care | Payer: PPO | Source: Ambulatory Visit | Attending: Nurse Practitioner | Admitting: Nurse Practitioner

## 2022-07-25 DIAGNOSIS — R0602 Shortness of breath: Secondary | ICD-10-CM | POA: Diagnosis not present

## 2022-07-25 DIAGNOSIS — R052 Subacute cough: Secondary | ICD-10-CM

## 2022-07-25 NOTE — Addendum Note (Signed)
Addended by: Shellia Hartl, Clarise Cruz E on: 07/25/2022 07:42 AM   Modules accepted: Orders

## 2022-07-29 DIAGNOSIS — R0602 Shortness of breath: Secondary | ICD-10-CM | POA: Insufficient documentation

## 2022-07-29 NOTE — Assessment & Plan Note (Signed)
See shortness of breath.

## 2022-07-29 NOTE — Assessment & Plan Note (Signed)
Emily [name] is experiencing ongoing fatigue, congestion, and dyspnea with exertion and at rest following influenza infection about 5 weeks ago. She has been treated with tamiflu, antibiotics, steroids, and inhalers for the problem. She has also been taken out of work for rest for several days, however, her symptoms still continue. At this time, I am concerned for possible pneumonia. I have ordered chest x-ray, but she has not been able to have this completed yet. I do encourage her to have this done as soon as possible so we can get a better evaluation of her lungs. At this time, we will plan to trial another course of prednisone to see if we can help with inflammation and improve her respiratory status. We will also plan to obtain a d-dimer to evaluate for possible presence of PE. We will continue to monitor closely. I have encouraged her to rest as much as possible and ensure that she is staying well hydrated. She has concerns for missing additional work. She is scheduled to work only 9-1 Saturday and Sunday and would like to try this. We have discussed that if this shift is tolerable we can consider allowing her to return to work on a part time basis and she is in agreement to this. She will let me know how she is feeling after the weekend shifts and if she feels she can return to work on Tuesday.

## 2022-07-31 NOTE — Progress Notes (Signed)
Synopsis: Referred for chronic fatigue, cough, DOE by Orma Render, NP  Subjective:   PATIENT ID: Emily [name]: female DOB: Dec 28, 1950, MRN: [id number]  No chief complaint on file.   69yF with history of anxiety, seasonal allergies, GERD, HTN who is referred by PCP for chronic fatigue, cough, DOE.  Seen by PCP 7/21 for otitis, sinusitis, prescribed azithromycin and later cefdinir. Pt had endorsed sore throat, ear pain, sinonasal fullness and exhaustion at time.  Periodically she gets exhausted with chest tightness, fatigue and shortness of breath 'emotions are at the forefront, I hate going to work,' her husband says she takes things personally quite a bit, irritable. These build up over a period of time until she says she gets some rest. She is a Nurse, learning disability. It has been quite busy over the last couple of years. By rest she means being able to leave her work behind completely. Most recently it took being off for 2 weeks. She has started trelegy 6 weeks ago perhaps. Maybe is helping. She is on treatment for glaucoma and asks if any component of trelegy could elevated IOP. No orthopnea. Had some BLE swelling which has responded to chlorthalidone.   Has had a stress test but many years ago.   She takes medicine to sleep - trazodone every night for many years. Frequent awakenings during these episodes of exhaustion, ruminates on work. No PND. She snores but not loud. She doesn't necessarily feel sleepy during these episodes of exhaustion. She says she is prone to panic attacks when she's super tired. Takes a xanax to abort these episodes.   She did smoke in past for 10 years, quit in 2009. Smoked half a pack to 1 ppd. She never had asthma as a kid. She says she is only rarely exposed to embalming fluid.   There is a history of lung cancer not in first degree relatives on her mother's side.   Interval HPI: Seen for sinusitis/otitis/bronchitis by PCP  and prescribed z pack  and prednisone burst on 04/05/21. This is the only course she's had since last visit. She is doing a bit better after this. Mood is improved as well. Fatigue has improved some since last visit. Cough is no longer productive.   Not feeling excessively sleepy during the day.   HSAT AHI of 14.6 -------------------------------------- Seen in PCP office 07/06/22 with dyspnea, URI sx and prescribed prednisone burst, ceftriaxone in office, toradol  DOes not appear she was seen by ENT  Otherwise pertinent review of systems is negative.  Past Medical History:  Diagnosis Date   Acute bacterial sinusitis 06/29/2021   Acute non-recurrent ethmoidal sinusitis 08/05/2021   Acute non-recurrent pansinusitis 01/25/2022   Allergy    seasonal    Anxiety    ARTHRALGIA 11/10/2009   Qualifier: Diagnosis of  By: Elease Hashimoto MD, Bruce     Cancer [facility]) 2009   squamous cell skin   Cataract    bilateral surgery, left unsuccessful   Cough 11/23/2020   Cystitis 02/12/2020   Depression    DYSPNEA 11/10/2009   Qualifier: Diagnosis of  By: Elease Hashimoto MD, Bruce     Eating disorder    GERD (gastroesophageal reflux disease)    Glaucoma    Head injury 07/23/2018   Hiatal hernia    History of cardiovascular stress test 04/2000   cardiolyte   History of flexible sigmoidoscopy 09/21/1999   Influenza A 03/13/2022   Other fatigue 02/26/2020   Otitis of both ears 03/08/2020  Sinobronchitis 04/05/2021   WEIGHT GAIN 11/10/2009   Qualifier: Diagnosis of  By: Elease Hashimoto MD, Bruce       Family History  Problem Relation Age of Onset   Arthritis Other    Cancer Other        lung   Coronary artery disease Other    Stroke Other    Colon cancer Neg Hx    Colon polyps Neg Hx    Esophageal cancer Neg Hx    Rectal cancer Neg Hx    Stomach cancer Neg Hx      Past Surgical History:  Procedure Laterality Date   ABDOMINAL HYSTERECTOMY     APPENDECTOMY     CARDIAC CATHETERIZATION  04/2000   CARDIAC CATHETERIZATION      COLONOSCOPY  12/31/09   UPPER GASTROINTESTINAL ENDOSCOPY  09/15/1999   WISDOM TOOTH EXTRACTION      Social History   Socioeconomic History   Marital status: Married    Spouse name: Mikki Santee   Number of children: Not on file   Years of education: 18   Highest education level: Bachelor's degree (e.g., BA, AB, BS)  Occupational History   Not on file  Tobacco Use   Smoking status: Former    Packs/day: 1.00    Years: 10.00    Total pack years: 10.00    Types: Cigarettes    Quit date: 05/27/2008    Years since quitting: 14.1    Passive exposure: Never   Smokeless tobacco: Never  Vaping Use   Vaping Use: Never used  Substance and Sexual Activity   Alcohol use: Yes    Comment: occ   Drug use: No   Sexual activity: Yes    Partners: Male  Other Topics Concern   Not on file  Social History Narrative   Not on file   Social Determinants of Health   Financial Resource Strain: Low Risk  (10/14/2021)   Overall Financial Resource Strain (CARDIA)    Difficulty of Paying Living Expenses: Not hard at all  Food Insecurity: No Food Insecurity (10/14/2021)   Hunger Vital Sign    Worried About Running Out of Food in the Last Year: Never true    Ran Out of Food in the Last Year: Never true  Transportation Needs: No Transportation Needs (10/14/2021)   PRAPARE - Hydrologist (Medical): No    Lack of Transportation (Non-Medical): No  Physical Activity: Sufficiently Active (10/14/2021)   Exercise Vital Sign    Days of Exercise per Week: 5 days    Minutes of Exercise per Session: 30 min  Stress: Stress Concern Present (10/14/2021)   [place]    Feeling of Stress : Very much  Social Connections: Moderately Isolated (10/14/2021)   Social Connection and Isolation Panel [NHANES]    Frequency of Communication with Friends and Family: Once a week    Frequency of Social Gatherings with Friends and Family:  Once a week    Attends Religious Services: Never    Marine scientist or Organizations: Yes    Attends Archivist Meetings: Never    Marital Status: Married  Human resources officer Violence: Not At Risk (10/14/2021)   Humiliation, Afraid, Rape, and Kick questionnaire    Fear of Current or Ex-Partner: No    Emotionally Abused: No    Physically Abused: No    Sexually Abused: No     Allergies  Allergen Reactions   Doxycycline  Gi upset   Penicillins     REACTION: rash, aggression Has patient had a PCN reaction causing immediate rash, facial/tongue/throat swelling, SOB or lightheadedness with hypotension: YES Has patient had a PCN reaction causing severe rash involving mucus membranes or skin necrosis: NO Has patient had a PCN reaction that required hospitalizationNO Has patient had a PCN reaction occurring within the last 10 years:NO If all of the above answers are "NO", then may proceed with Cephalosporin use.    Prednisone     REACTION: agressive behavior     Outpatient Medications Prior to Visit  Medication Sig Dispense Refill   aspirin 81 MG EC tablet Take 1 tablet by mouth twice daily for 2 weeks, then take 1 tablet by mouth once daily for 2 weeks 60 tablet 0   azelastine (OPTIVAR) 0.05 % ophthalmic solution Place 2 drops into both eyes 2 (two) times daily. 6 mL 11   benzonatate (TESSALON) 200 MG capsule Take 1 capsule (200 mg total) by mouth 3 (three) times daily as needed for cough. (Patient not taking: Reported on 07/10/2022) 45 capsule 0   Blood Pressure Monitor MISC Use as directed 1 each 0   buPROPion (WELLBUTRIN XL) 150 MG 24 hr tablet Take 1 tablet by mouth every morning 90 tablet 4   chlorthalidone (HYGROTON) 25 MG tablet Take 1 tablet (25 mg total) by mouth daily. 30 tablet 12   Cholecalciferol (VITAMIN D3) 1.25 MG (50000 UT) CAPS Take 1 capsule by mouth weekly 4 capsule 12   cycloSPORINE (RESTASIS) 0.05 % ophthalmic emulsion Instill 1 drop into both eyes  twice daily. 180 each PRN   dapagliflozin propanediol (FARXIGA) 10 MG TABS tablet Take 1 tablet (10 mg total) by mouth daily. 90 tablet 3   diclofenac Sodium (VOLTAREN) 1 % GEL Apply 4 g topically 4 (four) times daily. (Patient not taking: Reported on 05/24/2022) 100 g 0   diphenhydrAMINE (BENADRYL) 25 MG tablet Take 25 mg by mouth every 6 (six) hours as needed.     fluconazole (DIFLUCAN) 150 MG tablet Take one tablet by mouth at the first sign of symptoms of yeast. If no resolution, repeat dose in 72 hours. (Patient not taking: Reported on 07/21/2022) 2 tablet 2   fluticasone-salmeterol (ADVAIR) 100-50 MCG/ACT AEPB Inhale 1 puff into the lungs 2 (two) times daily. 60 each 3   guaiFENesin (MUCINEX) 600 MG 12 hr tablet Take 600 mg by mouth 2 (two) times daily.     HYDROcodone bit-homatropine (HYCODAN) 5-1.5 MG/5ML syrup Take 5 mLs by mouth every 8 (eight) hours as needed for cough. 60 mL 0   meclizine (ANTIVERT) 25 MG tablet Take 1 tablet (25 mg total) by mouth 3 (three) times daily as needed for dizziness. (Patient not taking: Reported on 07/06/2022) 90 tablet 3   Netarsudil-Latanoprost (ROCKLATAN) 0.02-0.005 % SOLN INSTILL 1 DROP INTO EACH EYE AT BEDTIME 2.5 mL 11   ondansetron (ZOFRAN) 4 MG tablet Take 1 tablet by mouth every six to eight hours as needed (Patient not taking: Reported on 05/24/2022) 30 tablet 0   predniSONE (DELTASONE) 20 MG tablet Take 3 tablets (35m) by mouth daily for 2 days, THEN 2 tablets (449m daily for 2 days, THEN 1 tablet (2071mdaily for 3 days. 13 tablet 0   traZODone (DESYREL) 100 MG tablet Take 300 mg by mouth at bedtime.     No facility-administered medications prior to visit.       Objective:   Physical Exam:  General appearance: [age]o.,  female, NAD, conversant  Eyes: anicteric sclerae, moist conjunctivae; no lid-lag; PERRL, tracking appropriately HENT: NCAT; oropharynx, MMM, no mucosal ulcerations; normal hard and soft palate. TM clear. Nasal mucosa is boggy,  edematous but no purulent mucus.  Neck: Trachea midline; no lymphadenopathy, no JVD Lungs: CTAB, no crackles, no wheeze, with normal respiratory effort CV: RRR, no MRGs  Abdomen: Soft, non-tender; non-distended, BS present  Extremities: No peripheral edema, radial and DP pulses present bilaterally. +BLE non-pitting edema. Skin: Normal temperature, turgor and texture; no rash Psych: Appropriate affect Neuro: Alert and oriented to person and place, no focal deficit    There were no vitals filed for this visit.     on RA BMI Readings from Last 3 Encounters:  07/21/22 39.75 kg/m  07/10/22 39.75 kg/m  07/06/22 39.94 kg/m   Wt Readings from Last 3 Encounters:  07/21/22 250 lb (113.4 kg)  07/10/22 250 lb (113.4 kg)  07/06/22 251 lb 3.2 oz (113.9 kg)     CBC    Component Value Date/Time   WBC 5.2 07/06/2022 1216   WBC 5.6 03/29/2022 1336   RBC 4.63 07/06/2022 1216   RBC 5.01 03/29/2022 1336   HGB 14.5 07/06/2022 1216   HCT 42.9 07/06/2022 1216   PLT 276 07/06/2022 1216   MCV 93 07/06/2022 1216   MCH 31.3 07/06/2022 1216   MCH 30.3 03/29/2022 1336   MCHC 33.8 07/06/2022 1216   MCHC 34.1 03/29/2022 1336   RDW 12.7 07/06/2022 1216   LYMPHSABS 1.3 07/06/2022 1216   MONOABS 0.7 03/29/2022 1336   EOSABS 0.2 07/06/2022 1216   BASOSABS 0.0 07/06/2022 1216      Chest Imaging: CT A/P 03/2019 reviewed by me and remarkable for clear lungs  CXR 03/2018 reviewed by me and remarkable for clear lungs  CXR 03/15/21 reviewed by me and unremarkable  CXR 07/27/22 reviewed by me unremarkable   Pulmonary Functions Testing Results:    Latest Ref Rng & Units 04/26/2021    1:53 PM  PFT Results  FVC-Pre L 2.60   FVC-Predicted Pre % 91   FVC-Post L 2.45   FVC-Predicted Post % 86   Pre FEV1/FVC % % 85   Post FEV1/FCV % % 89   FEV1-Pre L 2.21   FEV1-Predicted Pre % 103   FEV1-Post L 2.19   DLCO uncorrected ml/min/mmHg 20.08   DLCO UNC% % 107   DLCO corrected ml/min/mmHg 20.08    DLCO COR %Predicted % 107   DLVA Predicted % 114   TLC L 4.20   TLC % Predicted % 85   RV % Predicted % 76     Reviewed by me and unremarkable  Assessment & Plan:   # Cough: # DOE: # Fatigue:  # Mild-moderate OSA: I think she could stand to benefit symptomatically from trial of AutoPAP but she declines today. She does still appear mildly volume overloaded on exam - has never had TTE or stress test. Asthma possible but PFTs not suggestive of it and LABA/LAMA/ICS may pose some risk with her refractory glaucoma. If workup unrevealing then mood disturbance with anxiety/panic may be another consideration. Regardless overall she is doing better today than at last visit.  # Otitis/sinusitis: appears to have resolved on exam and her symptoms are improved although she does have some residual boggy/edematous appearing nasal mucosa  Plan: - ENT referral placed today for recurrent issues with CRS, otitis/otalgia/aural fullness - she will read more about autopap/cpap at home and will hold off on starting  now. 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   I spent 39 minutes dedicated to the care of this patient on the date of this encounter to include pre-visit review of records, face-to-face time with the patient discussing conditions above, post visit ordering of testing, clinical documentation with the electronic health record, making appropriate referrals as documented, and communicating necessary findings to members of the patients care team.     Maryjane Hurter, MD [facility] 07/31/2022 7:46 PM

## 2022-08-02 ENCOUNTER — Ambulatory Visit: Payer: PPO | Admitting: Student

## 2022-08-02 ENCOUNTER — Other Ambulatory Visit (HOSPITAL_BASED_OUTPATIENT_CLINIC_OR_DEPARTMENT_OTHER): Payer: Self-pay

## 2022-08-02 DIAGNOSIS — R0609 Other forms of dyspnea: Secondary | ICD-10-CM | POA: Diagnosis not present

## 2022-08-02 DIAGNOSIS — R052 Subacute cough: Secondary | ICD-10-CM

## 2022-08-02 LAB — BRAIN NATRIURETIC PEPTIDE: Pro B Natriuretic peptide (BNP): 25 pg/mL (ref 0.0–100.0)

## 2022-08-02 MED ORDER — TRELEGY ELLIPTA 100-62.5-25 MCG/ACT IN AEPB: 1.0000 | INHALATION_SPRAY | Freq: Every day | RESPIRATORY_TRACT | 0 refills | Status: DC

## 2022-08-02 MED ORDER — FLUTICASONE PROPIONATE 50 MCG/ACT NA SUSP
1.0000 | Freq: Every day | NASAL | 2 refills | Status: AC
  Filled 2022-08-02: qty 16, 30d supply, fill #0
  Filled 2023-08-02: qty 16, 30d supply, fill #1

## 2022-08-02 MED ORDER — OMEPRAZOLE 40 MG PO CPDR
40.0000 mg | DELAYED_RELEASE_CAPSULE | Freq: Every day | ORAL | 0 refills | Status: DC
  Filled 2022-08-02: qty 90, 90d supply, fill #0

## 2022-08-02 NOTE — Patient Instructions (Signed)
-   ok to continue trelegy for now 1 puff once daily, rinse mouth and brush tongue/teeth after use - would take shower, clear nose of crusting then flonase 1 spray each nostril everyday till next visit - omeprazole 40 mg daily 30 minutes bfore breakfast daily till next visit - ok to stop prednisone, call our clinic if you're feeling any worse after stopping - see you in 8 weeks or sooner if need be!

## 2022-08-07 ENCOUNTER — Encounter: Payer: Self-pay | Admitting: Nurse Practitioner

## 2022-08-09 ENCOUNTER — Encounter: Payer: Self-pay | Admitting: Medical

## 2022-08-09 ENCOUNTER — Ambulatory Visit
Admission: RE | Admit: 2022-08-09 | Discharge: 2022-08-09 | Disposition: A | Discharge status: Home / Self Care | Payer: PPO | Source: Ambulatory Visit | Attending: Medical | Admitting: Medical

## 2022-08-09 ENCOUNTER — Other Ambulatory Visit (HOSPITAL_BASED_OUTPATIENT_CLINIC_OR_DEPARTMENT_OTHER): Payer: Self-pay

## 2022-08-09 ENCOUNTER — Other Ambulatory Visit (INDEPENDENT_AMBULATORY_CARE_PROVIDER_SITE_OTHER): Payer: PPO

## 2022-08-09 ENCOUNTER — Telehealth (INDEPENDENT_AMBULATORY_CARE_PROVIDER_SITE_OTHER): Payer: PPO | Admitting: Medical

## 2022-08-09 VITALS — BP 132/80 | HR 73 | Temp 97.9°F | Resp 20 | Wt 256.0 lb

## 2022-08-09 DIAGNOSIS — Z8701 Personal history of pneumonia (recurrent): Secondary | ICD-10-CM

## 2022-08-09 DIAGNOSIS — R059 Cough, unspecified: Secondary | ICD-10-CM

## 2022-08-09 DIAGNOSIS — R0602 Shortness of breath: Secondary | ICD-10-CM

## 2022-08-09 DIAGNOSIS — R5383 Other fatigue: Secondary | ICD-10-CM

## 2022-08-09 LAB — CBC WITH DIFFERENTIAL/PLATELET
Basophils Absolute: 0 10*3/uL (ref 0.0–0.2)
Basos: 0 %
EOS (ABSOLUTE): 0.1 10*3/uL (ref 0.0–0.4)
Eos: 2 %
Hematocrit: 43 % (ref 34.0–46.6)
Hemoglobin: 14.6 g/dL (ref 11.1–15.9)
Lymphocytes Absolute: 1.1 10*3/uL (ref 0.7–3.1)
Lymphs: 14 %
MCH: 31.5 pg (ref 26.6–33.0)
MCHC: 34 g/dL (ref 31.5–35.7)
MCV: 93 fL (ref 79–97)
Monocytes Absolute: 0.5 10*3/uL (ref 0.1–0.9)
Monocytes: 6 %
Neutrophils Absolute: 6.2 10*3/uL (ref 1.4–7.0)
Neutrophils: 78 %
Platelets: 254 10*3/uL (ref 150–450)
RBC: 4.63 x10E6/uL (ref 3.77–5.28)
RDW: 14.7 % (ref 11.7–15.4)
WBC: 8 10*3/uL (ref 3.4–10.8)

## 2022-08-09 LAB — BASIC METABOLIC PANEL
BUN/Creatinine Ratio: 17 (ref 12–28)
BUN: 19 mg/dL (ref 8–27)
CO2: 28 mmol/L (ref 20–29)
Calcium: 9.5 mg/dL (ref 8.7–10.3)
Chloride: 103 mmol/L (ref 96–106)
Creatinine, Ser: 1.11 mg/dL — ABNORMAL HIGH (ref 0.57–1.00)
Glucose: 95 mg/dL (ref 70–99)
Potassium: 4.9 mmol/L (ref 3.5–5.2)
Sodium: 139 mmol/L (ref 134–144)
eGFR: 53 mL/min/{1.73_m2} — ABNORMAL LOW (ref >59)

## 2022-08-09 LAB — POC COVID19 BINAXNOW: SARS Coronavirus 2 Ag: NEGATIVE

## 2022-08-09 LAB — POCT RESPIRATORY SYNCYTIAL VIRUS: RSV Rapid Ag: NEGATIVE

## 2022-08-09 MED ORDER — ALBUTEROL SULFATE HFA 108 (90 BASE) MCG/ACT IN AERS
2.0000 | INHALATION_SPRAY | Freq: Four times a day (QID) | RESPIRATORY_TRACT | 0 refills | Status: DC | PRN
  Filled 2022-08-09: qty 8.5, 25d supply, fill #0

## 2022-08-09 NOTE — Patient Instructions (Signed)
Please go to [facility] for your chest xray.   Their hours are 8am - 4:30 pm Monday - Friday.  Take your insurance card with you.  [facility] [id number] [place], [place] [address]

## 2022-08-09 NOTE — Progress Notes (Signed)
Subjective:     Patient ID: Emily [name], female   DOB: [birth year], 72 y.o.   MRN: [id number]  This visit type was conducted due to national recommendations for restrictions regarding the COVID-19 Pandemic (e.g. social distancing) in an effort to limit this patient's exposure and mitigate transmission in our community.  Due to their co-morbid illnesses, this patient is at least at moderate risk for complications without adequate follow up.  This format is felt to be most appropriate for this patient at this time.    Documentation for virtual audio and video telecommunications through [facility] encounter:  The patient was located at home. The provider was located in the office. The patient did consent to this visit and is aware of possible charges through their insurance for this visit.  The other persons participating in this telemedicine service were husband.. Time spent on call was 20 minutes and in review of previous records 20 minutes total.  This virtual service is not related to other E/M service within previous 7 days.   HPI Chief Complaint  Patient presents with   Cough    Complains of cough, trouble breathimg, no energy and sinus headache and dizziness x 2-3 days. Has not done any home COVID testing.    Virtual consult for 2-3 days of fatigue, cough, trouble breathing, sinus pressure.  No home covid test done so far.  No fever, no body aches, no chills.  Has some sore throat.   Has been around a lot of people, possible sick contacts.    Had pneumonia in January, never fully resolved.  However, she did see pulmonology last week and reportedly lungs were clear last week.     Currently using Mucinex DM, claritin, benadryl, on 1/2 dose of prednisone. Couldn't tolerate the size of the tablets, so using 1/2 dose.    Using Trelegy inhaler daily prescribed in January when she was diagnosed with pneumonia.   No pain or swelling in legs, no hemoptysis, no chest pain.    Doesn't feel as well this week as she was last week.   No known heart disease, doesn't currently see cardiology. Had normal BNP test through pulmonology 7 days ago  No other aggravating or relieving factors. No other complaint.   Past Medical History:  Diagnosis Date   Acute bacterial sinusitis 06/29/2021   Acute non-recurrent ethmoidal sinusitis 08/05/2021   Acute non-recurrent pansinusitis 01/25/2022   Allergy    seasonal    Anxiety    ARTHRALGIA 11/10/2009   Qualifier: Diagnosis of  By: Elease Hashimoto MD, Bruce     Cancer [facility]) 2009   squamous cell skin   Cataract    bilateral surgery, left unsuccessful   Cough 11/23/2020   Cystitis 02/12/2020   Depression    DYSPNEA 11/10/2009   Qualifier: Diagnosis of  By: Elease Hashimoto MD, Bruce     Eating disorder    GERD (gastroesophageal reflux disease)    Glaucoma    Head injury 07/23/2018   Hiatal hernia    History of cardiovascular stress test 04/2000   cardiolyte   History of flexible sigmoidoscopy 09/21/1999   Influenza A 03/13/2022   Other fatigue 02/26/2020   Otitis of both ears 03/08/2020   Sinobronchitis 04/05/2021   WEIGHT GAIN 11/10/2009   Qualifier: Diagnosis of  By: Elease Hashimoto MD, Bruce     Current Outpatient Medications on File Prior to Visit  Medication Sig Dispense Refill   aspirin 81 MG EC tablet Take 1 tablet by mouth twice daily for  2 weeks, then take 1 tablet by mouth once daily for 2 weeks 60 tablet 0   azelastine (OPTIVAR) 0.05 % ophthalmic solution Place 2 drops into both eyes 2 (two) times daily. 6 mL 11   benzonatate (TESSALON) 200 MG capsule Take 1 capsule (200 mg total) by mouth 3 (three) times daily as needed for cough. 45 capsule 0   Blood Pressure Monitor MISC Use as directed 1 each 0   buPROPion (WELLBUTRIN XL) 150 MG 24 hr tablet Take 1 tablet by mouth every morning 90 tablet 4   chlorthalidone (HYGROTON) 25 MG tablet Take 1 tablet (25 mg total) by mouth daily. 30 tablet 12   Cholecalciferol (VITAMIN  D3) 1.25 MG (50000 UT) CAPS Take 1 capsule by mouth weekly 4 capsule 12   cycloSPORINE (RESTASIS) 0.05 % ophthalmic emulsion Instill 1 drop into both eyes twice daily. 180 each PRN   dapagliflozin propanediol (FARXIGA) 10 MG TABS tablet Take 1 tablet (10 mg total) by mouth daily. 90 tablet 3   diclofenac Sodium (VOLTAREN) 1 % GEL Apply 4 g topically 4 (four) times daily. 100 g 0   diphenhydrAMINE (BENADRYL) 25 MG tablet Take 25 mg by mouth every 6 (six) hours as needed.     fluticasone (FLONASE) 50 MCG/ACT nasal spray Place 1 spray into both nostrils daily. 16 g 2   Fluticasone-Umeclidin-Vilant (TRELEGY ELLIPTA) 100-62.5-25 MCG/ACT AEPB Inhale 2 puffs into the lungs daily.     Fluticasone-Umeclidin-Vilant (TRELEGY ELLIPTA) 100-62.5-25 MCG/ACT AEPB Inhale 1 puff into the lungs daily. 2 each 0   guaiFENesin (MUCINEX) 600 MG 12 hr tablet Take 600 mg by mouth 2 (two) times daily.     HYDROcodone bit-homatropine (HYCODAN) 5-1.5 MG/5ML syrup Take 5 mLs by mouth every 8 (eight) hours as needed for cough. 60 mL 0   meclizine (ANTIVERT) 25 MG tablet Take 1 tablet (25 mg total) by mouth 3 (three) times daily as needed for dizziness. 90 tablet 3   Netarsudil-Latanoprost (ROCKLATAN) 0.02-0.005 % SOLN INSTILL 1 DROP INTO EACH EYE AT BEDTIME 2.5 mL 11   omeprazole (PRILOSEC) 40 MG capsule Take 1 capsule (40 mg total) by mouth daily. 90 capsule 0   ondansetron (ZOFRAN) 4 MG tablet Take 1 tablet by mouth every six to eight hours as needed 30 tablet 0   predniSONE (DELTASONE) 20 MG tablet Take 3 tablets (61m) by mouth daily for 2 days, THEN 2 tablets (416m daily for 2 days, THEN 1 tablet (2035mdaily for 3 days. 13 tablet 0   traZODone (DESYREL) 100 MG tablet Take 300 mg by mouth at bedtime.     No current facility-administered medications on file prior to visit.    Review of Systems As in subjective      Objective:   Physical Exam Due to coronavirus pandemic stay at home measures, patient visit was  virtual and they were not examined in person.   BP 132/80   Pulse 73   Temp 97.9 F (36.6 C) (Tympanic)   Resp 20   Wt 256 lb (116.1 kg)   SpO2 96% Comment: room air  BMI 40.70 kg/m   Wt Readings from Last 3 Encounters:  08/09/22 256 lb (116.1 kg)  07/21/22 250 lb (113.4 kg)  07/10/22 250 lb (113.4 kg)   Gen: wd, wn, nad No obvious wheezing or labored breathing  In person exam: General appearence: alert, no distress, WD/WN,  HEENT: normocephalic, sclerae anicteric, TMs pearly, nares patent, no discharge or erythema, pharynx normal Oral cavity:  somewhat dry MM, no lesions Neck: supple, no lymphadenopathy, no thyromegaly, no masses, no JVD Heart: RRR, normal S1, S2, no murmurs Lungs: somewhat coarse in lower fields particularly right, otherwise no wheezes, rhonchi, or rales Ext: no edema obvious, no calve tenderness, swelling or asymmetry Pulses: 2+ symmetric, upper and lower extremities, normal cap refill      Assessment:     Encounter Diagnoses  Name Primary?   Cough, unspecified type Yes   History of recent pneumonia    SOB (shortness of breath)    Other fatigue        Plan:     We initially talked virtually but then after the discussion I had her come out to the office for COVID and RSV test which were both negative  We brought her into the office for in person evaluation  Vital signs reviewed.  We discussed her symptoms and concerns, her recent pneumonia in January, will she had a BNP blood test a week ago that was normal.  I will send her for a chest x-ray and stat labs today to further evaluate  In the meantime continue good water intake as she is still looking little dry in the mouth, can continue Mucinex and Zyrtec the next few days, continue Trelegy and rinse mouth out with water after use and can begin albuterol for some shortness of breath.  Discussed proper use of albuterol  We discussed differential diagnosis.  No obvious signs of pulmonary embolism,  normal pulse rate, normal oxygen, no calf abnormalities  She feels like she never got over the pneumonia completely from January  Clairessa was seen today for cough.  Diagnoses and all orders for this visit:  Cough, unspecified type -     CBC with Differential/Platelet -     Basic metabolic panel -     DG Chest 2 View; Future  History of recent pneumonia -     CBC with Differential/Platelet -     Basic metabolic panel -     DG Chest 2 View; Future  SOB (shortness of breath) -     CBC with Differential/Platelet -     Basic metabolic panel -     DG Chest 2 View; Future  Other fatigue -     CBC with Differential/Platelet -     Basic metabolic panel -     DG Chest 2 View; Future  Other orders -     albuterol (VENTOLIN HFA) 108 (90 Base) MCG/ACT inhaler; Inhale 2 puffs into the lungs every 6 (six) hours as needed for wheezing or shortness of breath.    F/u pending labs, chest xray

## 2022-08-09 NOTE — Progress Notes (Signed)
Thankfully the chest x-ray was normal.  The kidney marker is slightly abnormal suggesting some dehydration, electrolytes okay.  As we discussed, use Mucinex and Zyrtec next few days, continue Trelegy, use albuterol as needed, rest and hydrate well  Since you are a little dry or dehydrated, I recommend cutting your chlorthalidone fluid pill in half for the next 3 days.  Hold off on the [place] for 3 days.  This will help you to rehydrate  Sometimes it just takes a while for symptoms to resolve.  Use this recommendation above and call back Friday to let me know how you are feeling

## 2022-08-11 ENCOUNTER — Other Ambulatory Visit: Payer: Self-pay | Admitting: Nurse Practitioner

## 2022-08-11 ENCOUNTER — Other Ambulatory Visit: Payer: Self-pay

## 2022-08-11 ENCOUNTER — Other Ambulatory Visit (HOSPITAL_BASED_OUTPATIENT_CLINIC_OR_DEPARTMENT_OTHER): Payer: Self-pay

## 2022-08-11 MED ORDER — TRAZODONE HCL 100 MG PO TABS
300.0000 mg | ORAL_TABLET | Freq: Every day | ORAL | 3 refills | Status: DC
  Filled 2022-08-11: qty 90, 30d supply, fill #0
  Filled 2022-09-12 (×2): qty 90, 30d supply, fill #1

## 2022-08-12 ENCOUNTER — Other Ambulatory Visit (HOSPITAL_BASED_OUTPATIENT_CLINIC_OR_DEPARTMENT_OTHER): Payer: Self-pay

## 2022-08-22 ENCOUNTER — Ambulatory Visit
Admission: RE | Admit: 2022-08-22 | Discharge: 2022-08-22 | Disposition: A | Discharge status: Home / Self Care | Payer: PPO | Source: Ambulatory Visit | Attending: Nurse Practitioner | Admitting: Nurse Practitioner

## 2022-08-22 DIAGNOSIS — Z1231 Encounter for screening mammogram for malignant neoplasm of breast: Secondary | ICD-10-CM

## 2022-08-25 ENCOUNTER — Other Ambulatory Visit: Payer: Self-pay | Admitting: Nurse Practitioner

## 2022-08-25 DIAGNOSIS — N6459 Other signs and symptoms in breast: Secondary | ICD-10-CM

## 2022-09-04 ENCOUNTER — Other Ambulatory Visit (HOSPITAL_BASED_OUTPATIENT_CLINIC_OR_DEPARTMENT_OTHER): Payer: Self-pay

## 2022-09-04 MED ORDER — ALPRAZOLAM 1 MG PO TABS
0.5000 mg | ORAL_TABLET | Freq: Every day | ORAL | 0 refills | Status: DC | PRN
  Filled 2022-09-04: qty 45, 90d supply, fill #0

## 2022-09-06 ENCOUNTER — Ambulatory Visit
Admission: RE | Admit: 2022-09-06 | Discharge: 2022-09-06 | Disposition: A | Discharge status: Home / Self Care | Payer: PPO | Source: Ambulatory Visit | Attending: Nurse Practitioner | Admitting: Nurse Practitioner

## 2022-09-06 ENCOUNTER — Ambulatory Visit: Payer: PPO

## 2022-09-06 DIAGNOSIS — R928 Other abnormal and inconclusive findings on diagnostic imaging of breast: Secondary | ICD-10-CM | POA: Diagnosis not present

## 2022-09-06 DIAGNOSIS — N6459 Other signs and symptoms in breast: Secondary | ICD-10-CM

## 2022-09-08 ENCOUNTER — Encounter: Payer: Self-pay | Admitting: Nurse Practitioner

## 2022-09-08 ENCOUNTER — Telehealth (INDEPENDENT_AMBULATORY_CARE_PROVIDER_SITE_OTHER): Payer: PPO | Admitting: Nurse Practitioner

## 2022-09-08 VITALS — Wt 256.0 lb

## 2022-09-08 DIAGNOSIS — R053 Chronic cough: Secondary | ICD-10-CM | POA: Diagnosis not present

## 2022-09-08 DIAGNOSIS — R600 Localized edema: Secondary | ICD-10-CM

## 2022-09-08 DIAGNOSIS — R5383 Other fatigue: Secondary | ICD-10-CM | POA: Diagnosis not present

## 2022-09-08 DIAGNOSIS — R0602 Shortness of breath: Secondary | ICD-10-CM

## 2022-09-08 NOTE — Progress Notes (Signed)
Virtual Visit Encounter mychart visit.   I connected with  Emily [name] on 09/18/22 at  2:00 PM EDT by secure video and audio telemedicine application. I verified that I am speaking with the correct person using two identifiers.   I introduced myself as a Designer, jewellery with the practice. The limitations of evaluation and management by telemedicine discussed with the patient and the availability of in person appointments. The patient expressed verbal understanding and consent to proceed.  Participating parties in this visit include: Myself and patient  The patient is: Patient Location: Other:  work I am: Provider Location: Office/Clinic Subjective:    CC and HPI: Emily [name] is a 72 y.o. year old female presenting for follow up of [place].  Ivory tells me that she is working all the time now which she feels is exacerbating some of her fatigue.   Kristella presents today with chief complaints of lower back pain and ongoing shortness of breath. She denies experiencing any cough or production of colored sputum. The patient also reports a general feeling of fatigue and a lack of energy, with no burning sensation during urination.  She has a history of sleep apnea and has been evaluated by a pulmonologist, who did not link her current symptoms to her respiratory condition. [place] is on Chlorthalidone for blood pressure management. She reports the presence of dark urine and notes swelling in her feet, which she manages by sitting and propping her feet up during her workday.  Additionally, Tameron expresses concerns regarding her spouse's health history, which includes endocarditis and atrial fibrillation, and she inquires about the necessity of a cardiologist referral for him.   Past medical history, Surgical history, Family history not pertinant except as noted below, Social history, Allergies, and medications have been entered into the medical record, reviewed, and corrections made.    Review of Systems:  All review of systems negative except what is listed in the HPI  Objective:    Alert and oriented x 4 Speaking in clear sentences with no shortness of breath. No distress.  Impression and Recommendations:    Problem List Items Addressed This Visit     Leg edema    The patient has concerns with ongoing lower extremity edema. In the setting of shortness of breath, there is concern for cardiac etiology.  Plan: - Instruction for the patient to keep feet elevated during the day. - Continue Chlorthalidone as currently prescribed, with consideration for dose adjustment if edema persists or worsens. - Increased water intake is encouraged to aid in fluid balance. - Cardiology referral placed.       Shortness of breath - Primary    The patient is experiencing shortness of breath and fatigue. This is an ongoing issue. Given her recent history of significant illness, it is not clear if this is related to post viral condition or if there is something else going on. She has not seen cardiology.  Plan: - A cardiology referral is considered for an echocardiogram to assess cardiac function and potential fluid accumulation around the lungs. - The patient is encouraged to keep feet elevated and to increase water intake. - Monitoring for any symptom changes is advised, with follow-up as necessary.      Relevant Orders   Ambulatory referral to Cardiology   Other Visit Diagnoses     Chronic cough       Relevant Orders   Ambulatory referral to Cardiology   Decreased stamina  Relevant Orders   Ambulatory referral to Cardiology       orders and follow up as documented in EMR I discussed the assessment and treatment plan with the patient. The patient was provided an opportunity to ask questions and all were answered. The patient agreed with the plan and demonstrated an understanding of the instructions.   The patient was advised to call back or seek an in-person  evaluation if the symptoms worsen or if the condition fails to improve as anticipated.  Follow-Up: after labs and/or imaging, consults, etc. have been completed  I provided 21 minutes of non-face-to-face interaction with this non face-to-face encounter including intake, same-day documentation, and chart review.   Orma Render, NP , DNP, AGNP-c [facility]

## 2022-09-09 ENCOUNTER — Other Ambulatory Visit (HOSPITAL_BASED_OUTPATIENT_CLINIC_OR_DEPARTMENT_OTHER): Payer: Self-pay

## 2022-09-11 ENCOUNTER — Other Ambulatory Visit (HOSPITAL_BASED_OUTPATIENT_CLINIC_OR_DEPARTMENT_OTHER): Payer: Self-pay

## 2022-09-12 ENCOUNTER — Other Ambulatory Visit (HOSPITAL_BASED_OUTPATIENT_CLINIC_OR_DEPARTMENT_OTHER): Payer: Self-pay

## 2022-09-14 ENCOUNTER — Encounter: Payer: Self-pay | Admitting: Nurse Practitioner

## 2022-09-16 ENCOUNTER — Other Ambulatory Visit (HOSPITAL_BASED_OUTPATIENT_CLINIC_OR_DEPARTMENT_OTHER): Payer: Self-pay

## 2022-09-18 ENCOUNTER — Other Ambulatory Visit (HOSPITAL_BASED_OUTPATIENT_CLINIC_OR_DEPARTMENT_OTHER): Payer: Self-pay

## 2022-09-18 DIAGNOSIS — H04123 Dry eye syndrome of bilateral lacrimal glands: Secondary | ICD-10-CM | POA: Diagnosis not present

## 2022-09-18 NOTE — Assessment & Plan Note (Signed)
The patient is experiencing shortness of breath and fatigue. This is an ongoing issue. Given her recent history of significant illness, it is not clear if this is related to post viral condition or if there is something else going on. She has not seen cardiology.  Plan: - A cardiology referral is considered for an echocardiogram to assess cardiac function and potential fluid accumulation around the lungs. - The patient is encouraged to keep feet elevated and to increase water intake. - Monitoring for any symptom changes is advised, with follow-up as necessary.

## 2022-09-18 NOTE — Assessment & Plan Note (Signed)
The patient has concerns with ongoing lower extremity edema. In the setting of shortness of breath, there is concern for cardiac etiology.  Plan: - Instruction for the patient to keep feet elevated during the day. - Continue Chlorthalidone as currently prescribed, with consideration for dose adjustment if edema persists or worsens. - Increased water intake is encouraged to aid in fluid balance. - Cardiology referral placed.

## 2022-09-19 NOTE — Progress Notes (Unsigned)
Synopsis: Referred for chronic fatigue, cough, DOE by Orma Render, NP  Subjective:   PATIENT ID: Emily [name]: female DOB: 1951/04/15, MRN: [id number]  No chief complaint on file.   72yF with history of anxiety, seasonal allergies, GERD, HTN who is referred by PCP for chronic fatigue, cough, DOE.  Seen by PCP 7/21 for otitis, sinusitis, prescribed azithromycin and later cefdinir. Pt had endorsed sore throat, ear pain, sinonasal fullness and exhaustion at time.  Periodically she gets exhausted with chest tightness, fatigue and shortness of breath 'emotions are at the forefront, I hate going to work,' her husband says she takes things personally quite a bit, irritable. These build up over a period of time until she says she gets some rest. She is a Nurse, learning disability. It has been quite busy over the last couple of years. By rest she means being able to leave her work behind completely. Most recently it took being off for 2 weeks. She has started trelegy 6 weeks ago perhaps. Maybe is helping. She is on treatment for glaucoma and asks if any component of trelegy could elevated IOP. No orthopnea. Had some BLE swelling which has responded to chlorthalidone.   Has had a stress test but many years ago.   She takes medicine to sleep - trazodone every night for many years. Frequent awakenings during these episodes of exhaustion, ruminates on work. No PND. She snores but not loud. She doesn't necessarily feel sleepy during these episodes of exhaustion. She says she is prone to panic attacks when she's super tired. Takes a xanax to abort these episodes.   She did smoke in past for 10 years, quit in 2009. Smoked half a pack to 1 ppd. She never had asthma as a kid. She says she is only rarely exposed to embalming fluid.   There is a history of lung cancer not in first degree relatives on her mother's side.   Interval HPI:  Seen in PCP office 07/06/22 with dyspnea, URI sx and prescribed  prednisone burst, ceftriaxone in office, toradol  Does not appear she was seen by ENT  She says that she was told she had recent bout of pneumonia. Still on second course of prednisone. Did a couple rounds of antibiotics. She says she only takes 1 prednisone tablet per day - gets 'really aggressive' with higher dose of prednisone.  She says she feels overall pretty good now, just very weak. DOE to 50 feet. She is taking trelegy now over last 2-3 weeks - she's actually taking 2 puffs daily. Before that was on advair 100.   She does have cough. Mostly dry. She does have postnasal drainage. Not taking flonase. Uses mucinex, benadryl, occasional claritin. She does have heartburn, uses tums.  ------------------------------------------------------------ Continued on trelegy last visit   Started on ppi, flonase last visit for cough  REferred to ENT last visit  Otherwise pertinent review of systems is negative.  Past Medical History:  Diagnosis Date   Acute bacterial sinusitis 06/29/2021   Acute non-recurrent ethmoidal sinusitis 08/05/2021   Acute non-recurrent pansinusitis 01/25/2022   Allergy    seasonal    Anxiety    ARTHRALGIA 11/10/2009   Qualifier: Diagnosis of  By: Elease Hashimoto MD, Bruce     Cancer [facility]) 2009   squamous cell skin   Cataract    bilateral surgery, left unsuccessful   Cough 11/23/2020   Cystitis 02/12/2020   Depression    DYSPNEA 11/10/2009   Qualifier: Diagnosis of  By:  Burchette MD, Bruce     Eating disorder    GERD (gastroesophageal reflux disease)    Glaucoma    Head injury 07/23/2018   Hiatal hernia    History of cardiovascular stress test 04/2000   cardiolyte   History of flexible sigmoidoscopy 09/21/1999   Influenza A 03/13/2022   Other fatigue 02/26/2020   Otitis of both ears 03/08/2020   Sinobronchitis 04/05/2021   WEIGHT GAIN 11/10/2009   Qualifier: Diagnosis of  By: Elease Hashimoto MD, Bruce       Family History  Problem Relation Age of Onset    Arthritis Other    Cancer Other        lung   Coronary artery disease Other    Stroke Other    Colon cancer Neg Hx    Colon polyps Neg Hx    Esophageal cancer Neg Hx    Rectal cancer Neg Hx    Stomach cancer Neg Hx      Past Surgical History:  Procedure Laterality Date   ABDOMINAL HYSTERECTOMY     APPENDECTOMY     CARDIAC CATHETERIZATION  04/2000   CARDIAC CATHETERIZATION     COLONOSCOPY  12/31/09   UPPER GASTROINTESTINAL ENDOSCOPY  09/15/1999   WISDOM TOOTH EXTRACTION      Social History   Socioeconomic History   Marital status: Married    Spouse name: Mikki Santee   Number of children: Not on file   Years of education: 18   Highest education level: Bachelor's degree (e.g., BA, AB, BS)  Occupational History   Not on file  Tobacco Use   Smoking status: Former    Packs/day: 1.00    Years: 10.00    Additional pack years: 0.00    Total pack years: 10.00    Types: Cigarettes    Quit date: 05/27/2008    Years since quitting: 14.3    Passive exposure: Never   Smokeless tobacco: Never  Vaping Use   Vaping Use: Never used  Substance and Sexual Activity   Alcohol use: Yes    Comment: occ   Drug use: No   Sexual activity: Yes    Partners: Male  Other Topics Concern   Not on file  Social History Narrative   Not on file   Social Determinants of Health   Financial Resource Strain: Low Risk  (10/14/2021)   Overall Financial Resource Strain (CARDIA)    Difficulty of Paying Living Expenses: Not hard at all  Food Insecurity: No Food Insecurity (10/14/2021)   Hunger Vital Sign    Worried About Running Out of Food in the Last Year: Never true    Ran Out of Food in the Last Year: Never true  Transportation Needs: No Transportation Needs (10/14/2021)   PRAPARE - Hydrologist (Medical): No    Lack of Transportation (Non-Medical): No  Physical Activity: Sufficiently Active (10/14/2021)   Exercise Vital Sign    Days of Exercise per Week: 5 days    Minutes  of Exercise per Session: 30 min  Stress: Stress Concern Present (10/14/2021)   [place]    Feeling of Stress : Very much  Social Connections: Moderately Isolated (10/14/2021)   Social Connection and Isolation Panel [NHANES]    Frequency of Communication with Friends and Family: Once a week    Frequency of Social Gatherings with Friends and Family: Once a week    Attends Religious Services: Never    Active  Member of Clubs or Organizations: Yes    Attends Archivist Meetings: Never    Marital Status: Married  Human resources officer Violence: Not At Risk (10/14/2021)   Humiliation, Afraid, Rape, and Kick questionnaire    Fear of Current or Ex-Partner: No    Emotionally Abused: No    Physically Abused: No    Sexually Abused: No     Allergies  Allergen Reactions   Doxycycline     Gi upset   Penicillins     REACTION: rash, aggression Has patient had a PCN reaction causing immediate rash, facial/tongue/throat swelling, SOB or lightheadedness with hypotension: YES Has patient had a PCN reaction causing severe rash involving mucus membranes or skin necrosis: NO Has patient had a PCN reaction that required hospitalizationNO Has patient had a PCN reaction occurring within the last 10 years:NO If all of the above answers are "NO", then may proceed with Cephalosporin use.    Prednisone     REACTION: agressive behavior     Outpatient Medications Prior to Visit  Medication Sig Dispense Refill   albuterol (VENTOLIN HFA) 108 (90 Base) MCG/ACT inhaler Inhale 2 puffs into the lungs every 6 (six) hours as needed for wheezing or shortness of breath. 8.5 g 0   ALPRAZolam (XANAX) 1 MG tablet Take 1/2 to 1 tablet by mouth daily as needed (90 day supply) 45 tablet 0   aspirin 81 MG EC tablet Take 1 tablet by mouth twice daily for 2 weeks, then take 1 tablet by mouth once daily for 2 weeks 60 tablet 0   azelastine (OPTIVAR) 0.05 %  ophthalmic solution Place 2 drops into both eyes 2 (two) times daily. 6 mL 11   benzonatate (TESSALON) 200 MG capsule Take 1 capsule (200 mg total) by mouth 3 (three) times daily as needed for cough. 45 capsule 0   Blood Pressure Monitor MISC Use as directed 1 each 0   buPROPion (WELLBUTRIN XL) 150 MG 24 hr tablet Take 1 tablet by mouth every morning 90 tablet 4   chlorthalidone (HYGROTON) 25 MG tablet Take 1 tablet (25 mg total) by mouth daily. 30 tablet 12   Cholecalciferol (VITAMIN D3) 1.25 MG (50000 UT) capsule Take 1 capsule (50,000 Units total) by mouth once a week. 4 capsule 12   cycloSPORINE (RESTASIS) 0.05 % ophthalmic emulsion Instill 1 drop into both eyes twice daily. 180 each PRN   dapagliflozin propanediol (FARXIGA) 10 MG TABS tablet Take 1 tablet (10 mg total) by mouth daily. 90 tablet 3   diclofenac Sodium (VOLTAREN) 1 % GEL Apply 4 g topically 4 (four) times daily. 100 g 0   diphenhydrAMINE (BENADRYL) 25 MG tablet Take 25 mg by mouth every 6 (six) hours as needed.     fluticasone (FLONASE) 50 MCG/ACT nasal spray Place 1 spray into both nostrils daily. 16 g 2   Fluticasone-Umeclidin-Vilant (TRELEGY ELLIPTA) 100-62.5-25 MCG/ACT AEPB Inhale 2 puffs into the lungs daily.     Fluticasone-Umeclidin-Vilant (TRELEGY ELLIPTA) 100-62.5-25 MCG/ACT AEPB Inhale 1 puff into the lungs daily. 2 each 0   guaiFENesin (MUCINEX) 600 MG 12 hr tablet Take 600 mg by mouth 2 (two) times daily.     HYDROcodone bit-homatropine (HYCODAN) 5-1.5 MG/5ML syrup Take 5 mLs by mouth every 8 (eight) hours as needed for cough. (Patient not taking: Reported on 09/08/2022) 60 mL 0   meclizine (ANTIVERT) 25 MG tablet Take 1 tablet (25 mg total) by mouth 3 (three) times daily as needed for dizziness. 90 tablet 3  Netarsudil-Latanoprost (ROCKLATAN) 0.02-0.005 % SOLN INSTILL 1 DROP INTO EACH EYE AT BEDTIME 2.5 mL 11   omeprazole (PRILOSEC) 40 MG capsule Take 1 capsule (40 mg total) by mouth daily. 90 capsule 0   ondansetron  (ZOFRAN) 4 MG tablet Take 1 tablet by mouth every six to eight hours as needed (Patient not taking: Reported on 09/08/2022) 30 tablet 0   predniSONE (DELTASONE) 20 MG tablet Take 3 tablets (60mg ) by mouth daily for 2 days, THEN 2 tablets (40mg ) daily for 2 days, THEN 1 tablet (20mg ) daily for 3 days. (Patient not taking: Reported on 09/08/2022) 13 tablet 0   traZODone (DESYREL) 100 MG tablet Take 3 tablets (300 mg total) by mouth at bedtime. 90 tablet 3   No facility-administered medications prior to visit.       Objective:   Physical Exam:  General appearance: 72 y.o., female, NAD, conversant  Eyes: anicteric sclerae, moist conjunctivae; no lid-lag; PERRL, tracking appropriately HENT: NCAT; oropharynx, MMM, no mucosal ulcerations; normal hard and soft palate. TM clear. Nasal mucosa is boggy, edematous but no purulent mucus.  Neck: Trachea midline; no lymphadenopathy, no JVD Lungs: CTAB, no crackles, no wheeze, with normal respiratory effort CV: RRR, no MRGs  Abdomen: Soft, non-tender; non-distended, BS present  Extremities: No peripheral edema, radial and DP pulses present bilaterally. +BLE non-pitting edema. Skin: Normal temperature, turgor and texture; no rash Psych: Appropriate affect Neuro: Alert and oriented to person and place, no focal deficit    There were no vitals filed for this visit.     on RA BMI Readings from Last 3 Encounters:  09/08/22 40.70 kg/m  08/09/22 40.70 kg/m  07/21/22 39.75 kg/m   Wt Readings from Last 3 Encounters:  09/08/22 256 lb (116.1 kg)  08/09/22 256 lb (116.1 kg)  07/21/22 250 lb (113.4 kg)     CBC    Component Value Date/Time   WBC 8.0 08/09/2022 1142   WBC 5.6 03/29/2022 1336   RBC 4.63 08/09/2022 1142   RBC 5.01 03/29/2022 1336   HGB 14.6 08/09/2022 1142   HCT 43.0 08/09/2022 1142   PLT 254 08/09/2022 1142   MCV 93 08/09/2022 1142   MCH 31.5 08/09/2022 1142   MCH 30.3 03/29/2022 1336   MCHC 34.0 08/09/2022 1142   MCHC 34.1  03/29/2022 1336   RDW 14.7 08/09/2022 1142   LYMPHSABS 1.1 08/09/2022 1142   MONOABS 0.7 03/29/2022 1336   EOSABS 0.1 08/09/2022 1142   BASOSABS 0.0 08/09/2022 1142      Chest Imaging: CT A/P 03/2019 reviewed by me and remarkable for clear lungs  CXR 03/2018 reviewed by me and remarkable for clear lungs  CXR 03/15/21 reviewed by me and unremarkable  CXR 07/27/22 reviewed by me unremarkable   Pulmonary Functions Testing Results:    Latest Ref Rng & Units 04/26/2021    1:53 PM  PFT Results  FVC-Pre L 2.60   FVC-Predicted Pre % 91   FVC-Post L 2.45   FVC-Predicted Post % 86   Pre FEV1/FVC % % 85   Post FEV1/FCV % % 89   FEV1-Pre L 2.21   FEV1-Predicted Pre % 103   FEV1-Post L 2.19   DLCO uncorrected ml/min/mmHg 20.08   DLCO UNC% % 107   DLCO corrected ml/min/mmHg 20.08   DLCO COR %Predicted % 107   DLVA Predicted % 114   TLC L 4.20   TLC % Predicted % 85   RV % Predicted % 76     Reviewed  by me and unremarkable  Assessment & Plan:   # Cough: # DOE: # Fatigue:  # Mild-moderate OSA: I think she could stand to benefit symptomatically from trial of AutoPAP but she declines today. She does still appear mildly volume overloaded on exam - has never had TTE or stress test. Asthma possible but PFTs not suggestive of it and LABA/LAMA/ICS may pose some risk with her refractory glaucoma. With regard to her cough in particular today it sounds like we could work on PND, gerd.   # Otitis/sinusitis: appears to have resolved on exam and her symptoms are improved although she does have some residual boggy/edematous appearing nasal mucosa  Plan: - ok to continue trelegy for now 1 puff once daily, rinse mouth and brush tongue/teeth after use - would take shower, clear nose of crusting then flonase 1 spray each nostril everyday till next visit - omeprazole 40 mg daily 30 minutes bfore breakfast daily till next visit - ok to stop prednisone, call our clinic if you're feeling any worse  after stopping - see you in 8 weeks or sooner if need be! - ENT referral placed previously for recurrent issues with CRS, otitis/otalgia/aural fullness - she will read more about autopap/cpap at home and will hold off on starting now. 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     Maryjane Hurter, MD [facility] 09/19/2022 9:10 AM

## 2022-09-20 ENCOUNTER — Other Ambulatory Visit (HOSPITAL_BASED_OUTPATIENT_CLINIC_OR_DEPARTMENT_OTHER): Payer: Self-pay

## 2022-09-21 ENCOUNTER — Ambulatory Visit (INDEPENDENT_AMBULATORY_CARE_PROVIDER_SITE_OTHER): Payer: PPO | Admitting: Student

## 2022-09-21 ENCOUNTER — Encounter: Payer: Self-pay | Admitting: Student

## 2022-09-21 ENCOUNTER — Other Ambulatory Visit (HOSPITAL_BASED_OUTPATIENT_CLINIC_OR_DEPARTMENT_OTHER): Payer: Self-pay

## 2022-09-21 VITALS — BP 130/84 | HR 101 | Temp 97.8°F | Ht 66.5 in | Wt 260.0 lb

## 2022-09-21 DIAGNOSIS — R0609 Other forms of dyspnea: Secondary | ICD-10-CM | POA: Diagnosis not present

## 2022-09-21 MED ORDER — OMEPRAZOLE 40 MG PO CPDR
40.0000 mg | DELAYED_RELEASE_CAPSULE | Freq: Every day | ORAL | 0 refills | Status: DC
  Filled 2022-09-21: qty 90, 90d supply, fill #0

## 2022-09-21 NOTE — Patient Instructions (Signed)
-   ok to continue trelegy for now 1 puff once daily, rinse mouth and brush tongue/teeth after use - would take shower, clear nose of crusting then flonase 1 spray each nostril everyday till next visit - omeprazole 40 mg daily 30 minutes bfore breakfast daily till next visit - if we still can't figure out why you're short of breath with exertion after you see cardiology then I would give stronger consideration to trying CPAP and/or pursue CPEX (cardiopulmonary exercise testing) - see you in 8 weeks or sooner if need be!

## 2022-09-22 ENCOUNTER — Other Ambulatory Visit: Payer: Self-pay | Admitting: Nurse Practitioner

## 2022-09-22 ENCOUNTER — Other Ambulatory Visit (HOSPITAL_BASED_OUTPATIENT_CLINIC_OR_DEPARTMENT_OTHER): Payer: Self-pay

## 2022-09-25 ENCOUNTER — Other Ambulatory Visit (HOSPITAL_BASED_OUTPATIENT_CLINIC_OR_DEPARTMENT_OTHER): Payer: Self-pay

## 2022-09-25 ENCOUNTER — Other Ambulatory Visit: Payer: Self-pay | Admitting: Nurse Practitioner

## 2022-09-25 DIAGNOSIS — H1033 Unspecified acute conjunctivitis, bilateral: Secondary | ICD-10-CM

## 2022-09-25 MED ORDER — AZELASTINE HCL 0.05 % OP SOLN
2.0000 [drp] | Freq: Two times a day (BID) | OPHTHALMIC | 5 refills | Status: DC
  Filled 2022-09-25: qty 6, 15d supply, fill #0
  Filled 2022-10-03 – 2022-10-17 (×3): qty 6, 15d supply, fill #1
  Filled 2022-11-10: qty 6, 15d supply, fill #2
  Filled 2023-01-23: qty 6, 15d supply, fill #3
  Filled 2023-03-20: qty 6, 15d supply, fill #4
  Filled 2023-05-28: qty 6, 15d supply, fill #5

## 2022-09-25 MED ORDER — TRAZODONE HCL 100 MG PO TABS
300.0000 mg | ORAL_TABLET | Freq: Every day | ORAL | 3 refills | Status: DC
  Filled 2022-09-25: qty 90, 30d supply, fill #0

## 2022-10-03 ENCOUNTER — Other Ambulatory Visit: Payer: Self-pay | Admitting: Medical

## 2022-10-04 ENCOUNTER — Other Ambulatory Visit (HOSPITAL_BASED_OUTPATIENT_CLINIC_OR_DEPARTMENT_OTHER): Payer: Self-pay

## 2022-10-04 MED ORDER — ALBUTEROL SULFATE HFA 108 (90 BASE) MCG/ACT IN AERS
2.0000 | INHALATION_SPRAY | Freq: Four times a day (QID) | RESPIRATORY_TRACT | 0 refills | Status: DC | PRN
  Filled 2022-10-04 – 2022-10-17 (×2): qty 6.7, 25d supply, fill #0

## 2022-10-06 ENCOUNTER — Other Ambulatory Visit: Payer: Self-pay

## 2022-10-06 ENCOUNTER — Other Ambulatory Visit (HOSPITAL_BASED_OUTPATIENT_CLINIC_OR_DEPARTMENT_OTHER): Payer: Self-pay

## 2022-10-11 ENCOUNTER — Telehealth: Payer: Self-pay | Admitting: Nurse Practitioner

## 2022-10-11 NOTE — Telephone Encounter (Signed)
Contacted Emily [name] to schedule their annual wellness visit. Appointment made for 10/17/22.  Emily [name] AWV direct phone # [phone]

## 2022-10-13 ENCOUNTER — Other Ambulatory Visit (HOSPITAL_BASED_OUTPATIENT_CLINIC_OR_DEPARTMENT_OTHER): Payer: Self-pay

## 2022-10-17 ENCOUNTER — Other Ambulatory Visit (HOSPITAL_BASED_OUTPATIENT_CLINIC_OR_DEPARTMENT_OTHER): Payer: Self-pay

## 2022-10-23 ENCOUNTER — Encounter (HOSPITAL_BASED_OUTPATIENT_CLINIC_OR_DEPARTMENT_OTHER): Payer: Self-pay | Admitting: Cardiology

## 2022-10-23 ENCOUNTER — Ambulatory Visit (HOSPITAL_BASED_OUTPATIENT_CLINIC_OR_DEPARTMENT_OTHER): Payer: PPO | Admitting: Cardiology

## 2022-10-23 ENCOUNTER — Other Ambulatory Visit (HOSPITAL_BASED_OUTPATIENT_CLINIC_OR_DEPARTMENT_OTHER): Payer: Self-pay

## 2022-10-23 VITALS — BP 134/85 | HR 75 | Ht 66.5 in | Wt 263.3 lb

## 2022-10-23 DIAGNOSIS — R600 Localized edema: Secondary | ICD-10-CM | POA: Diagnosis not present

## 2022-10-23 DIAGNOSIS — R5382 Chronic fatigue, unspecified: Secondary | ICD-10-CM | POA: Diagnosis not present

## 2022-10-23 DIAGNOSIS — R0609 Other forms of dyspnea: Secondary | ICD-10-CM | POA: Diagnosis not present

## 2022-10-23 DIAGNOSIS — I10 Essential (primary) hypertension: Secondary | ICD-10-CM

## 2022-10-23 DIAGNOSIS — Z7189 Other specified counseling: Secondary | ICD-10-CM

## 2022-10-23 MED ORDER — METOPROLOL TARTRATE 25 MG PO TABS
ORAL_TABLET | ORAL | 0 refills | Status: DC
  Filled 2022-10-23: qty 1, 1d supply, fill #0

## 2022-10-23 NOTE — Progress Notes (Signed)
Cardiology Office Note:    Date:  10/23/2022   ID:  Jaxyn, Dykstra 05/29/1951, MRN [id number]  PCP:  Tollie Eth, NP  Cardiologist:  Jodelle Red, MD  Referring MD: Tollie Eth, NP   CC: new patient evaluation for shortness of breath and decreased stamina.  History of Present Illness:    Emily [name] is a 72 y.o. female with a hx of hypertension who is seen as a new consult at the request of Early, Sung Amabile, NP for the evaluation and management of shortness of breath and decreased stamina.  Note from 09/08/22 with Enid Skeens, NP reviewed. Virtual visit at that time due to post-Covid. Noted continued shortness of breath, fatigue, and LE edema. Is being followed by pulmonology. Referred to cardiology for further evaluation.  Here with her husband today. Notes that since last May, she has been feeling poorly. Started with sinus/allergy issues, then had the flu in the fall, and then had pneumonia in January. Feels that breathing has not returned to baseline. Feels like she breathes fine when she is sleeping. Feels that she is the most symptomatic with walking, can walk about 50 feet before she has to stop. Can walk longer in the morning, but as the day goes one her stamina decreases. Very tired, can sleep 12-14 hours.   Before this, no prior history of heart and lung issues. Father had an MI. HTN runs in father's family. Noted that she gained weight on the prednisone after her knee replacement. Weight was 242 lbs prior to knee surgery, had only mild leg swelling. Now weight is up, legs swollen all day. Tried compression socks after knee replacement, didn't tolerate due to hot flashes.  Wakes up and coughs for several minutes every morning, brings up phlegm. Notes that she coughs more when it is warmer. Inhalers have improved her breathing somewhat, helps her clear her chest.  Denies chest pain. No PND, orthopnea. No syncope or palpitations.   Past Medical History:   Diagnosis Date   Acute bacterial sinusitis 06/29/2021   Acute non-recurrent ethmoidal sinusitis 08/05/2021   Acute non-recurrent pansinusitis 01/25/2022   Allergy    seasonal    Anxiety    ARTHRALGIA 11/10/2009   Qualifier: Diagnosis of  By: Caryl Never MD, Bruce     Cancer [facility]) 2009   squamous cell skin   Cataract    bilateral surgery, left unsuccessful   Cough 11/23/2020   Cystitis 02/12/2020   Depression    DYSPNEA 11/10/2009   Qualifier: Diagnosis of  By: Caryl Never MD, Bruce     Eating disorder    GERD (gastroesophageal reflux disease)    Glaucoma    Head injury 07/23/2018   Hiatal hernia    History of cardiovascular stress test 04/2000   cardiolyte   History of flexible sigmoidoscopy 09/21/1999   Influenza A 03/13/2022   Other fatigue 02/26/2020   Otitis of both ears 03/08/2020   Sinobronchitis 04/05/2021   WEIGHT GAIN 11/10/2009   Qualifier: Diagnosis of  By: Caryl Never MD, Bruce      Past Surgical History:  Procedure Laterality Date   ABDOMINAL HYSTERECTOMY     APPENDECTOMY     CARDIAC CATHETERIZATION  04/2000   CARDIAC CATHETERIZATION     COLONOSCOPY  12/31/09   UPPER GASTROINTESTINAL ENDOSCOPY  09/15/1999   WISDOM TOOTH EXTRACTION      Current Medications: Current Outpatient Medications on File Prior to Visit  Medication Sig   albuterol (VENTOLIN HFA) 108 (90 Base)  MCG/ACT inhaler Inhale 2 puffs into the lungs every 6 (six) hours as needed for wheezing or shortness of breath.   ALPRAZolam (XANAX) 1 MG tablet Take 1/2 to 1 tablet by mouth daily as needed (90 day supply)   aspirin 81 MG EC tablet Take 1 tablet by mouth twice daily for 2 weeks, then take 1 tablet by mouth once daily for 2 weeks   azelastine (OPTIVAR) 0.05 % ophthalmic solution Place 2 drops into both eyes 2 (two) times daily.   Blood Pressure Monitor MISC Use as directed   buPROPion (WELLBUTRIN XL) 150 MG 24 hr tablet Take 1 tablet by mouth every morning   chlorthalidone (HYGROTON) 25 MG  tablet Take 1 tablet (25 mg total) by mouth daily.   Cholecalciferol (VITAMIN D3) 1.25 MG (50000 UT) capsule Take 1 capsule (50,000 Units total) by mouth once a week.   cycloSPORINE (RESTASIS) 0.05 % ophthalmic emulsion Instill 1 drop into both eyes twice daily.   dapagliflozin propanediol (FARXIGA) 10 MG TABS tablet Take 1 tablet (10 mg total) by mouth daily.   diphenhydrAMINE (BENADRYL) 25 MG tablet Take 25 mg by mouth every 6 (six) hours as needed.   fluticasone (FLONASE) 50 MCG/ACT nasal spray Place 1 spray into both nostrils daily.   Fluticasone-Umeclidin-Vilant (TRELEGY ELLIPTA) 100-62.5-25 MCG/ACT AEPB Inhale 1 puff into the lungs daily.   guaiFENesin (MUCINEX) 600 MG 12 hr tablet Take 600 mg by mouth 2 (two) times daily.   HYDROcodone bit-homatropine (HYCODAN) 5-1.5 MG/5ML syrup Take 5 mLs by mouth every 8 (eight) hours as needed for cough.   meclizine (ANTIVERT) 25 MG tablet Take 1 tablet (25 mg total) by mouth 3 (three) times daily as needed for dizziness.   Netarsudil-Latanoprost (ROCKLATAN) 0.02-0.005 % SOLN INSTILL 1 DROP INTO EACH EYE AT BEDTIME   omeprazole (PRILOSEC) 40 MG capsule Take 1 capsule (40 mg total) by mouth daily.   ondansetron (ZOFRAN) 4 MG tablet Take 1 tablet by mouth every six to eight hours as needed   traZODone (DESYREL) 100 MG tablet Take 3 tablets (300 mg total) by mouth at bedtime.   No current facility-administered medications on file prior to visit.     Allergies:   Doxycycline, Penicillins, and Prednisone   Social History   Tobacco Use   Smoking status: Former    Packs/day: 1.00    Years: 10.00    Additional pack years: 0.00    Total pack years: 10.00    Types: Cigarettes    Quit date: 05/27/2008    Years since quitting: 14.4    Passive exposure: Never   Smokeless tobacco: Never  Vaping Use   Vaping Use: Never used  Substance Use Topics   Alcohol use: Yes    Comment: occ   Drug use: No    Family History: family history includes Arthritis  in an other family member; Cancer in an other family member; Coronary artery disease in an other family member; Stroke in an other family member. There is no history of Colon cancer, Colon polyps, Esophageal cancer, Rectal cancer, or Stomach cancer.  ROS:   Please see the history of present illness.  Additional pertinent ROS: Constitutional: Negative for chills, fever, night sweats, unintentional weight loss  HENT: Negative for ear pain and hearing loss.   Eyes: Negative for loss of vision and eye pain.  Respiratory: Positive for cough, sputum, wheezing.   Cardiovascular: See HPI. Gastrointestinal: Negative for melena, and hematochezia. Has intermittent RUQ pain, she is concerned for her  gallbladder Genitourinary: Negative for dysuria and hematuria.  Musculoskeletal: Negative for falls and myalgias.  Skin: Negative for itching and rash.  Neurological: Negative for focal weakness, focal sensory changes and loss of consciousness.  Endo/Heme/Allergies: Does not bruise/bleed easily.     EKGs/Labs/Other Studies Reviewed:    The following studies were reviewed today: No prior cardiac testing available  Reports remote heart cath at [facility] >20 years ago. 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  EKG:  EKG is personally reviewed.   10/23/22: NSR at 75 pmn, PRWP, LAD  Recent Labs: 07/06/2022: ALT 27 08/02/2022: Pro B Natriuretic peptide (BNP) 25.0 08/09/2022: BUN 19; Creatinine, Ser 1.11; Hemoglobin 14.6; Platelets 254; Potassium 4.9; Sodium 139  Recent Lipid Panel    Component Value Date/Time   CHOL 181 10/14/2021 1004   TRIG 127 10/14/2021 1004   HDL 59 10/14/2021 1004   CHOLHDL 3.1 10/14/2021 1004   CHOLHDL 3.0 02/12/2020 1519   VLDL 28.8 01/27/2016 0823   LDLCALC 100 (H) 10/14/2021 1004   LDLCALC 93 02/12/2020 1519    Physical Exam:    VS:  BP 134/85 (BP Location: Left Arm, Patient Position: Sitting, Cuff Size: Large)    Pulse 75   Ht 5' 6.5" (1.689 m)   Wt 263 lb 4.8 oz (119.4 kg)   BMI 41.86 kg/m     Wt Readings from Last 3 Encounters:  10/23/22 263 lb 4.8 oz (119.4 kg)  09/21/22 260 lb (117.9 kg)  09/08/22 256 lb (116.1 kg)    GEN: Well nourished, well developed in no acute distress HEENT: Normal, moist mucous membranes NECK: No JVD CARDIAC: regular rhythm, normal S1 and S2, no rubs or gallops. No murmur. VASCULAR: Radial and DP pulses 2+ bilaterally. No carotid bruits RESPIRATORY:  Clear to auscultation without rales, wheezing or rhonchi  ABDOMEN: Soft, non-tender, non-distended MUSCULOSKELETAL:  Ambulates independently SKIN: Warm and dry, no pitting or brawny edema NEUROLOGIC:  Alert and oriented x 3. No focal neuro deficits noted. PSYCHIATRIC:  Normal affect    ASSESSMENT:    1. Dyspnea on exertion   2. Primary hypertension   3. Bilateral leg edema   4. Chronic fatigue   5. Cardiac risk counseling   6. Counseling on health promotion and disease prevention    PLAN:    Dyspnea on exertion Fatigue on exertion LE edema -will get echocardiogram to rule out structural issues -discussed treadmill stress, nuclear stress/lexiscan, and CT coronary angiography. Discussed pros and cons of each, including but not limited to false positive/false negative risk, radiation risk, and risk of IV contrast dye. Based on shared decision making, decision was made to pursue CT coronary angiography. -will give one time dose of metoprolol 2 hours prior to scheduled test -counseled on need to get BMET prior to test -counseled on use of sublingual nitroglycerin and its importance to a good test -reviewed red flag warning signs that need immediate medical attention   Hypertension -well controlled  Cardiac risk counseling and prevention recommendations: -recommend heart healthy/Mediterranean diet, with whole grains, fruits, vegetable, fish, lean meats, nuts, and olive oil. Limit salt. -recommend moderate  walking, 3-5 times/week for 30-50 minutes each session. Aim for at least 150 minutes.week. Goal should be pace of 3 miles/hours, or walking 1.5 miles in 30 minutes -recommend avoidance of tobacco products. Avoid excess alcohol. -ASCVD risk score: The 10-year ASCVD risk score (Arnett DK, et al., 2019) is: 16.4%   Values  used to calculate the score:     Age: 53 years     Sex: Female     Is Non-Hispanic African American: No     Diabetic: No     Tobacco smoker: No     Systolic Blood Pressure: 134 mmHg     Is BP treated: Yes     HDL Cholesterol: 59 mg/dL     Total Cholesterol: 181 mg/dL    Plan for follow up: to be determined based on results of testing  Jodelle Red, MD, PhD, [facility]  [facility]  [place]  [facility] at [facility] at [facility] [address] [place], [place] [address] [phone]   Medication Adjustments/Labs and Tests Ordered: Current medicines are reviewed at length with the patient today.  Concerns regarding medicines are outlined above.  Orders Placed This Encounter  Procedures   CT CORONARY MORPH W/CTA COR W/SCORE W/CA W/CM &/OR WO/CM   Basic metabolic panel   EKG 12-Lead   ECHOCARDIOGRAM COMPLETE   Meds ordered this encounter  Medications   metoprolol tartrate (LOPRESSOR) 25 MG tablet    Sig: Take 1 tablet (25 mg) TWO hours prior to CT scan    Dispense:  1 tablet    Refill:  0    Patient Instructions  Medication Instructions:  TAKE METOPROLOL 25 MG 1 TABLET 2 HOURS PRIOR TO CT   *If you need a refill on your cardiac medications before your next appointment, please call your pharmacy*  Lab Work: BMET TODAY   If you have labs (blood work) drawn today and your tests are completely normal, you will receive your results only by: MyChart Message (if you have MyChart) OR A paper copy in the mail If you have any lab test that is abnormal or we need to change your treatment, we will  call you to review the results.  Testing/Procedures: Your physician has requested that you have an echocardiogram. Echocardiography is a painless test that uses sound waves to create images of your heart. It provides your doctor with information about the size and shape of your heart and how well your heart's chambers and valves are working. This procedure takes approximately one hour. There are no restrictions for this procedure. Please do NOT wear cologne, perfume, aftershave, or lotions (deodorant is allowed). Please arrive 15 minutes prior to your appointment time.  Your physician has requested that you have cardiac CT. Cardiac computed tomography (CT) is a painless test that uses an x-ray machine to take clear, detailed pictures of your heart. For further information please visit [email or web address] Please follow instruction sheet as given.  Follow-Up: TO BE DETERMINED BASED ON TESTING RESULTS   Other Instructions   Your cardiac CT will be scheduled at one of the below locations:   [facility] [address] [place], [place] [address] [phone]  OR  [facility] [address] [place], [place] [address] [phone]  OR   [facility] [address] [place], [place] [address] [phone]  If scheduled at [facility], please arrive at the [facility] and Children's Entrance (Entrance C2) of [facility] 30 minutes prior to test start time. You can use the FREE valet parking offered at entrance C (encouraged to control the heart rate for the test)  Proceed to the [facility] Radiology Department (first floor) to check-in and test prep.  All radiology patients and guests should  use entrance C2 at [facility], accessed from [facility], even though the hospital's physical address listed is [address].    If scheduled at [facility] or [facility], please arrive 15 mins early for check-in and test prep.   Please follow these instructions carefully (unless otherwise directed):  On the Night Before the Test: Be sure to Drink plenty of water. Do not consume any caffeinated/decaffeinated beverages or chocolate 12 hours prior to your test. Do not take any antihistamines 12 hours prior to your test.  On the Day of the Test: Drink plenty of water until 1 hour prior to the test. Do not eat any food 1 hour prior to test. You may take your regular medications prior to the test.  Take metoprolol (Lopressor) two hours prior to test. If you take Furosemide/Hydrochlorothiazide/Spironolactone, please HOLD on the morning of the test. FEMALES- please wear underwire-free bra if available, avoid dresses & tight clothing  After the Test: Drink plenty of water. After receiving IV contrast, you may experience a mild flushed feeling. This is normal. On occasion, you may experience a mild rash up to 24 hours after the test. This is not dangerous. If this occurs, you can take Benadryl 25 mg and increase your fluid intake. If you experience trouble breathing, this can be serious. If it is severe call 911 IMMEDIATELY. If it is mild, please call our office. If you take any of these medications: Glipizide/Metformin, Avandament, Glucavance, please do not take 48 hours after completing test unless otherwise instructed.  We will call to schedule your test 2-4 weeks out understanding that some insurance companies will need an authorization prior to the service being performed.   For non-scheduling related questions, please contact the cardiac imaging nurse navigator should you have any questions/concerns: Rockwell [place], Cardiac Imaging Nurse Navigator Larey Brick, Cardiac Imaging Nurse Navigator [place] [facility] Direct Office Dial: [phone]   For scheduling needs, including  cancellations and rescheduling, please call [place], [phone].  Cardiac CT Angiogram A cardiac CT angiogram is a procedure to look at the heart and the area around the heart. It may be done to help find the cause of chest pains or other symptoms of heart disease. During this procedure, a substance called contrast dye is injected into a vein in the arm. The contrast highlights the blood vessels in the area to be checked. A large X-ray machine (CT scanner), then takes detailed pictures of the heart and the surrounding area. The procedure is also sometimes called a coronary CT angiogram, coronary artery scanning, or CTA. A cardiac CT angiogram allows the health care provider to see how well blood is flowing to and from the heart. The provider will be able to see if there are any problems, such as: Blockage or narrowing of the arteries in the heart. Fluid around the heart. Signs of weakness or disease in the muscles, valves, and tissues of the heart. Tell a health care provider about: Any allergies you have. This is especially important if you have had a previous allergic reaction to medicines, contrast dye, or iodine. All medicines you are taking, including vitamins, herbs, eye drops, creams, and over-the-counter medicines. Any bleeding problems you have. Any surgeries you have had. Any medical conditions you have, including kidney problems or kidney failure. Whether you are pregnant or may be pregnant. Any anxiety disorders, chronic pain, or other conditions you have. These may increase your stress or  prevent you from lying still. Any history of abnormal heart rhythms or heart procedures. What are the risks? Your provider will talk with you about risks. These may include: Bleeding. Infection. Allergic reactions to medicines or dyes. Damage to other structures or organs. Kidney damage from the contrast dye. Increased risk of cancer from radiation exposure. This risk is low. Talk with your  provider about: The risks and benefits of testing. How you can receive the lowest dose of radiation. What happens before the procedure? Wear comfortable clothing and remove any jewelry, glasses, dentures, and hearing aids. Follow instructions from your provider about eating and drinking. These may include: 12 hours before the procedure Avoid caffeine. This includes tea, coffee, soda, energy drinks, and diet pills. Drink plenty of water or other fluids that do not have caffeine in them. Being well hydrated can prevent complications. 4-6 hours before the procedure Stop eating and drinking. This will reduce the risk of nausea from the contrast dye. Ask your provider about changing or stopping your regular medicines. These include: Diabetes medicines. Medicines to treat problems with erections (erectile dysfunction). If you have kidney problems, you may need to receive IV hydration before and after the test. What happens during the procedure?  Hair on your chest may need to be removed so that small sticky patches called electrodes can be placed on your chest. These will transmit information that helps to monitor your heart during the procedure. An IV will be inserted into one of your veins. You might be given a medicine to control your heart rate during the procedure. This will help to ensure that good images are obtained. You will be asked to lie on an exam table. This table will slide in and out of the CT machine during the procedure. Contrast dye will be injected into the IV. You might feel warm, or you may get a metallic taste in your mouth. You may be given medicines to relax or dilate the arteries in your heart. If you are allergic to contrast dyes or iodine you may be given medicine before the test to reduce the risk of an allergic reaction. The table that you are lying on will move into the CT machine tunnel for the scan. The person running the machine will give you instructions while  the scans are being done. You may be asked to: Keep your arms above your head. Hold your breath for short periods. Stay very still, even if the table is moving. The procedure may vary among providers and hospitals. What can I expect after the procedure? After your procedure, it is common to have: A metallic taste in your mouth from the contrast dye. A feeling of warmth. A headache from the heart medicine. Follow these instructions at home: Take over-the-counter and prescription medicines only as told by your provider. If you are told, drink enough fluid to keep your pee pale yellow. This will help to flush the contrast dye out of your body. Most people can return to their normal activities right after the procedure. Ask your provider what activities are safe for you. It is up to you to get the results of your procedure. Ask your provider, or the department that is doing the procedure, when your results will be ready. Contact a health care provider if: You have any symptoms of allergy to the contrast dye. These include: Shortness of breath. Rash or hives. A racing heartbeat. You notice a change in your peeing (urination). This information is not intended to  replace advice given to you by your health care provider. Make sure you discuss any questions you have with your health care provider. Document Revised: 01/06/2022 Document Reviewed: 01/06/2022 Elsevier Patient Education  2023 Elsevier Inc.    Signed, Jodelle Red, MD PhD 10/23/2022 11:25 AM    [facility]

## 2022-10-23 NOTE — Patient Instructions (Addendum)
Medication Instructions:  TAKE METOPROLOL 25 MG 1 TABLET 2 HOURS PRIOR TO CT   *If you need a refill on your cardiac medications before your next appointment, please call your pharmacy*  Lab Work: BMET TODAY   If you have labs (blood work) drawn today and your tests are completely normal, you will receive your results only by: MyChart Message (if you have MyChart) OR A paper copy in the mail If you have any lab test that is abnormal or we need to change your treatment, we will call you to review the results.  Testing/Procedures: Your physician has requested that you have an echocardiogram. Echocardiography is a painless test that uses sound waves to create images of your heart. It provides your doctor with information about the size and shape of your heart and how well your heart's chambers and valves are working. This procedure takes approximately one hour. There are no restrictions for this procedure. Please do NOT wear cologne, perfume, aftershave, or lotions (deodorant is allowed). Please arrive 15 minutes prior to your appointment time.  Your physician has requested that you have cardiac CT. Cardiac computed tomography (CT) is a painless test that uses an x-ray machine to take clear, detailed pictures of your heart. For further information please visit [email or web address] Please follow instruction sheet as given.  Follow-Up: TO BE DETERMINED BASED ON TESTING RESULTS   Other Instructions   Your cardiac CT will be scheduled at one of the below locations:   [facility] [address] [place], [place] [address] [phone]  OR  [facility] [address] [place], [place] [address] [phone]  OR   [facility] [address] [place], [place] [address] [phone]  If scheduled at [facility], please arrive at the [facility] and Children's Entrance (Entrance C2) of [facility] 30 minutes prior to test start time. You can use the FREE valet parking offered at entrance C (encouraged to control the heart rate for the test)  Proceed to the [facility] Radiology Department (first floor) to check-in and test prep.  All radiology patients and guests should use entrance C2 at [facility], accessed from [facility], even though the hospital's physical address listed is [address]    If scheduled at [facility] or [facility], please arrive 15 mins early for check-in and test prep.   Please follow these instructions carefully (unless otherwise directed):  On the Night Before the Test: Be sure to Drink plenty of water. Do not consume any caffeinated/decaffeinated beverages or chocolate 12 hours prior to your test. Do not take any antihistamines 12 hours prior to your test.  On the Day of the Test: Drink plenty of water until 1 hour prior to the test. Do not eat any food 1 hour prior to test. You may take your regular medications prior to the test.  Take metoprolol (Lopressor) two hours prior to test. If you take Furosemide/Hydrochlorothiazide/Spironolactone, please HOLD on the morning of the test. FEMALES- please wear underwire-free bra if available, avoid dresses & tight clothing  After the Test: Drink plenty of water. After receiving IV contrast, you may experience a mild flushed feeling. This is normal. On occasion, you may experience a mild rash up to 24 hours after the test. This is not dangerous. If this occurs, you can take Benadryl 25 mg and increase your fluid intake. If you experience trouble  breathing, this can be serious. If it is severe call 911 IMMEDIATELY. If it is mild, please call our office. If you take any of these medications: Glipizide/Metformin, Avandament, Glucavance, please do not take 48 hours after completing test unless otherwise instructed.  We will  call to schedule your test 2-4 weeks out understanding that some insurance companies will need an authorization prior to the service being performed.   For non-scheduling related questions, please contact the cardiac imaging nurse navigator should you have any questions/concerns: Rockwell Alexandria, Cardiac Imaging Nurse Navigator Larey Brick, Cardiac Imaging Nurse Navigator [facility] Direct Office Dial: [phone]   For scheduling needs, including cancellations and rescheduling, please call [place], [phone].  Cardiac CT Angiogram A cardiac CT angiogram is a procedure to look at the heart and the area around the heart. It may be done to help find the cause of chest pains or other symptoms of heart disease. During this procedure, a substance called contrast dye is injected into a vein in the arm. The contrast highlights the blood vessels in the area to be checked. A large X-ray machine (CT scanner), then takes detailed pictures of the heart and the surrounding area. The procedure is also sometimes called a coronary CT angiogram, coronary artery scanning, or CTA. A cardiac CT angiogram allows the health care provider to see how well blood is flowing to and from the heart. The provider will be able to see if there are any problems, such as: Blockage or narrowing of the arteries in the heart. Fluid around the heart. Signs of weakness or disease in the muscles, valves, and tissues of the heart. Tell a health care provider about: Any allergies you have. This is especially important if you have had a previous allergic reaction to medicines, contrast dye, or iodine. All medicines you are taking, including vitamins, herbs, eye drops, creams, and over-the-counter medicines. Any bleeding problems you have. Any surgeries you have had. Any medical conditions you have, including kidney problems or kidney failure. Whether you are pregnant or may be pregnant. Any anxiety  disorders, chronic pain, or other conditions you have. These may increase your stress or prevent you from lying still. Any history of abnormal heart rhythms or heart procedures. What are the risks? Your provider will talk with you about risks. These may include: Bleeding. Infection. Allergic reactions to medicines or dyes. Damage to other structures or organs. Kidney damage from the contrast dye. Increased risk of cancer from radiation exposure. This risk is low. Talk with your provider about: The risks and benefits of testing. How you can receive the lowest dose of radiation. What happens before the procedure? Wear comfortable clothing and remove any jewelry, glasses, dentures, and hearing aids. Follow instructions from your provider about eating and drinking. These may include: 12 hours before the procedure Avoid caffeine. This includes tea, coffee, soda, energy drinks, and diet pills. Drink plenty of water or other fluids that do not have caffeine in them. Being well hydrated can prevent complications. 4-6 hours before the procedure Stop eating and drinking. This will reduce the risk of nausea from the contrast dye. Ask your provider about changing or stopping your regular medicines. These include: Diabetes medicines. Medicines to treat problems with erections (erectile dysfunction). If you have kidney problems, you may need to receive IV hydration before and after the test. What happens during the procedure?  Hair on your chest may need to be removed so that small sticky patches called electrodes can  be placed on your chest. These will transmit information that helps to monitor your heart during the procedure. An IV will be inserted into one of your veins. You might be given a medicine to control your heart rate during the procedure. This will help to ensure that good images are obtained. You will be asked to lie on an exam table. This table will slide in and out of the CT machine  during the procedure. Contrast dye will be injected into the IV. You might feel warm, or you may get a metallic taste in your mouth. You may be given medicines to relax or dilate the arteries in your heart. If you are allergic to contrast dyes or iodine you may be given medicine before the test to reduce the risk of an allergic reaction. The table that you are lying on will move into the CT machine tunnel for the scan. The person running the machine will give you instructions while the scans are being done. You may be asked to: Keep your arms above your head. Hold your breath for short periods. Stay very still, even if the table is moving. The procedure may vary among providers and hospitals. What can I expect after the procedure? After your procedure, it is common to have: A metallic taste in your mouth from the contrast dye. A feeling of warmth. A headache from the heart medicine. Follow these instructions at home: Take over-the-counter and prescription medicines only as told by your provider. If you are told, drink enough fluid to keep your pee pale yellow. This will help to flush the contrast dye out of your body. Most people can return to their normal activities right after the procedure. Ask your provider what activities are safe for you. It is up to you to get the results of your procedure. Ask your provider, or the department that is doing the procedure, when your results will be ready. Contact a health care provider if: You have any symptoms of allergy to the contrast dye. These include: Shortness of breath. Rash or hives. A racing heartbeat. You notice a change in your peeing (urination). This information is not intended to replace advice given to you by your health care provider. Make sure you discuss any questions you have with your health care provider. Document Revised: 01/06/2022 Document Reviewed: 01/06/2022 Elsevier Patient Education  2023 [employer].

## 2022-10-24 LAB — BASIC METABOLIC PANEL
BUN/Creatinine Ratio: 14 (ref 12–28)
BUN: 13 mg/dL (ref 8–27)
CO2: 25 mmol/L (ref 20–29)
Calcium: 10.1 mg/dL (ref 8.7–10.3)
Chloride: 106 mmol/L (ref 96–106)
Creatinine, Ser: 0.95 mg/dL (ref 0.57–1.00)
Glucose: 108 mg/dL — ABNORMAL HIGH (ref 70–99)
Potassium: 4.8 mmol/L (ref 3.5–5.2)
Sodium: 146 mmol/L — ABNORMAL HIGH (ref 134–144)
eGFR: 64 mL/min/{1.73_m2} (ref >59)

## 2022-10-25 ENCOUNTER — Other Ambulatory Visit (HOSPITAL_BASED_OUTPATIENT_CLINIC_OR_DEPARTMENT_OTHER): Payer: Self-pay

## 2022-10-30 ENCOUNTER — Telehealth (HOSPITAL_COMMUNITY): Payer: Self-pay | Admitting: *Deleted

## 2022-10-30 ENCOUNTER — Other Ambulatory Visit (HOSPITAL_BASED_OUTPATIENT_CLINIC_OR_DEPARTMENT_OTHER): Payer: Self-pay

## 2022-10-30 MED ORDER — BUPROPION HCL ER (XL) 150 MG PO TB24
150.0000 mg | ORAL_TABLET | Freq: Every morning | ORAL | 3 refills | Status: DC
  Filled 2022-10-30: qty 90, 90d supply, fill #0
  Filled 2023-05-16 – 2023-05-28 (×2): qty 90, 90d supply, fill #1
  Filled 2023-08-24 – 2023-08-25 (×2): qty 90, 90d supply, fill #2

## 2022-10-30 MED ORDER — TRAZODONE HCL 100 MG PO TABS
300.0000 mg | ORAL_TABLET | Freq: Every day | ORAL | 3 refills | Status: DC
  Filled 2022-10-30: qty 270, 90d supply, fill #0
  Filled 2023-02-03: qty 270, 90d supply, fill #1
  Filled 2023-05-16 – 2023-05-28 (×2): qty 270, 90d supply, fill #2
  Filled 2023-08-24 – 2023-08-25 (×2): qty 270, 90d supply, fill #3

## 2022-10-30 MED ORDER — VITAMIN D3 1.25 MG (50000 UT) PO CAPS
50000.0000 [IU] | ORAL_CAPSULE | ORAL | 3 refills | Status: DC
  Filled 2022-10-30: qty 12, 84d supply, fill #0

## 2022-10-30 NOTE — Telephone Encounter (Signed)
Reaching out to patient to offer assistance regarding upcoming cardiac imaging study; pt verbalizes understanding of appt date/time, parking situation and where to check in, pre-test NPO status and verified current allergies; name and call back number provided for further questions should they arise  Larey Brick RN Navigator Cardiac Imaging [facility] [phone] office [phone] cell  Patient to take 25mg  metoprolol tartrate two hours prior to her cardiac CT scan.  She is aware to arrive at 1:30pm

## 2022-10-31 ENCOUNTER — Ambulatory Visit (HOSPITAL_COMMUNITY)
Admission: RE | Admit: 2022-10-31 | Discharge: 2022-10-31 | Disposition: A | Discharge status: Home / Self Care | Payer: PPO | Source: Ambulatory Visit | Attending: Cardiology | Admitting: Cardiology

## 2022-10-31 DIAGNOSIS — I10 Essential (primary) hypertension: Secondary | ICD-10-CM | POA: Insufficient documentation

## 2022-10-31 DIAGNOSIS — R0609 Other forms of dyspnea: Secondary | ICD-10-CM | POA: Diagnosis not present

## 2022-10-31 DIAGNOSIS — R9431 Abnormal electrocardiogram [ECG] [EKG]: Secondary | ICD-10-CM

## 2022-10-31 MED ORDER — NITROGLYCERIN 0.4 MG SL SUBL
0.8000 mg | SUBLINGUAL_TABLET | Freq: Once | SUBLINGUAL | Status: AC
  Administered 2022-10-31: 0.8 mg via SUBLINGUAL

## 2022-10-31 MED ORDER — IOHEXOL 350 MG/ML SOLN
95.0000 mL | Freq: Once | INTRAVENOUS | Status: AC | PRN
  Administered 2022-10-31: 95 mL via INTRAVENOUS

## 2022-10-31 MED ORDER — NITROGLYCERIN 0.4 MG SL SUBL
SUBLINGUAL_TABLET | SUBLINGUAL | Status: AC
  Filled 2022-10-31: qty 2

## 2022-11-01 ENCOUNTER — Other Ambulatory Visit (HOSPITAL_BASED_OUTPATIENT_CLINIC_OR_DEPARTMENT_OTHER): Payer: Self-pay

## 2022-11-02 DIAGNOSIS — Z96652 Presence of left artificial knee joint: Secondary | ICD-10-CM | POA: Diagnosis not present

## 2022-11-02 DIAGNOSIS — M1712 Unilateral primary osteoarthritis, left knee: Secondary | ICD-10-CM | POA: Diagnosis not present

## 2022-11-06 DIAGNOSIS — H401131 Primary open-angle glaucoma, bilateral, mild stage: Secondary | ICD-10-CM | POA: Diagnosis not present

## 2022-11-10 ENCOUNTER — Other Ambulatory Visit (HOSPITAL_BASED_OUTPATIENT_CLINIC_OR_DEPARTMENT_OTHER): Payer: Self-pay

## 2022-11-17 ENCOUNTER — Telehealth: Payer: Self-pay | Admitting: Nurse Practitioner

## 2022-11-17 ENCOUNTER — Telehealth (INDEPENDENT_AMBULATORY_CARE_PROVIDER_SITE_OTHER): Payer: PPO | Admitting: Nurse Practitioner

## 2022-11-17 ENCOUNTER — Other Ambulatory Visit (HOSPITAL_BASED_OUTPATIENT_CLINIC_OR_DEPARTMENT_OTHER): Payer: Self-pay

## 2022-11-17 VITALS — BP 168/80 | Wt 255.0 lb

## 2022-11-17 DIAGNOSIS — J0111 Acute recurrent frontal sinusitis: Secondary | ICD-10-CM

## 2022-11-17 DIAGNOSIS — I10 Essential (primary) hypertension: Secondary | ICD-10-CM

## 2022-11-17 MED ORDER — ONDANSETRON HCL 4 MG PO TABS
4.0000 mg | ORAL_TABLET | Freq: Four times a day (QID) | ORAL | 0 refills | Status: DC | PRN
  Filled 2022-11-17: qty 30, 8d supply, fill #0

## 2022-11-17 MED ORDER — VALSARTAN 40 MG PO TABS
40.0000 mg | ORAL_TABLET | Freq: Every day | ORAL | 3 refills | Status: DC
  Filled 2022-11-17: qty 90, 90d supply, fill #0

## 2022-11-17 MED ORDER — AZITHROMYCIN 250 MG PO TABS
ORAL_TABLET | ORAL | 0 refills | Status: AC
  Filled 2022-11-17: qty 11, 5d supply, fill #0

## 2022-11-17 NOTE — Telephone Encounter (Signed)
Emily [name] had cancellation so made virtual appt

## 2022-11-17 NOTE — Telephone Encounter (Signed)
Pt called and states she thinks she has a sinus infection, she wanted an appt but none are available with you or Shane.  Eye hurts, cheeks hurt, little sore throat, top of head hurts, Right ear hurts.  Doesn't want to go to Urgent care for this.  Said you treated her for similar earlier in year.  Can you call in something for her.

## 2022-11-17 NOTE — Progress Notes (Signed)
Virtual Visit Encounter mychart visit.   I connected with  Emily [name] on 12/14/22 at 11:45 AM EDT by secure video and audio telemedicine application. I verified that I am speaking with the correct person using two identifiers.   I introduced myself as a Publishing rights manager with the practice. The limitations of evaluation and management by telemedicine discussed with the patient and the availability of in person appointments. The patient expressed verbal understanding and consent to proceed.  Participating parties in this visit include: Myself and patient  The patient is: Patient Location: Home I am: Provider Location: Office/Clinic Subjective:    CC and HPI: Emily [name] is a 72 y.o. year old female presenting for new evaluation and treatment of sinus symptoms.  Emily [name] reports a suspected sinus infection with symptoms of coughing up yellow/green mucus, body aches and chills, sinus pressure, congestion, headache, and generalized fatigue. She has been feel unwell for several days and has progressively worsening. She has not had a fever or cough. She has been taking Advil to manage her symptoms, but it is not completely eliminating symptoms.   Emily [name] also expresses concerns over her fluctuating blood pressure readings at home. She reports recent readings as high as 147/90 and as low as 114/60. She believes these changes started around May. She is no longer taking metoprolol and only on chlorthalidone at this time.   Past medical history, Surgical history, Family history not pertinant except as noted below, Social history, Allergies, and medications have been entered into the medical record, reviewed, and corrections made.   Review of Systems:  All review of systems negative except what is listed in the HPI  Objective:    Alert and oriented x 4 Ill appearing Speaking in clear sentences with no shortness of breath. No distress.  Impression and Recommendations:    Problem  List Items Addressed This Visit     Acute recurrent frontal sinusitis - Primary    Davion presents with symptoms consistent with sinusitis.  Given the length of time her symptoms have been present we will plan to manage with antibiotics.  Her husband, Nadine Counts, is also exhibiting similar symptoms. Plan: -Will send antibiotics for both Lenny and Nadine Counts for management of current infection. -Both are to monitor closely for new or worsening symptoms and report immediately -If symptoms are not showing signs of improvement over the next 3 to 4 days recommend calling the office to let us know.      Relevant Medications   ondansetron (ZOFRAN) 4 MG tablet   Hypertension    Emily [name] is experiencing fluctuations in her blood pressure readings, with elevations as the primary symptom. We discussed adding Valsartan and splitting chlorthalidone to see if this helps. We can increase dose, if needed.        orders and follow up as documented in EMR I discussed the assessment and treatment plan with the patient. The patient was provided an opportunity to ask questions and all were answered. The patient agreed with the plan and demonstrated an understanding of the instructions.   The patient was advised to call back or seek an in-person evaluation if the symptoms worsen or if the condition fails to improve as anticipated.  Follow-Up: prn  I provided 18 minutes of non-face-to-face interaction with this non face-to-face encounter including intake, same-day documentation, and chart review.   Tollie Eth, NP , DNP, AGNP-c [facility] Medicine

## 2022-11-20 ENCOUNTER — Telehealth (HOSPITAL_BASED_OUTPATIENT_CLINIC_OR_DEPARTMENT_OTHER): Payer: Self-pay

## 2022-11-20 DIAGNOSIS — I10 Essential (primary) hypertension: Secondary | ICD-10-CM

## 2022-11-20 DIAGNOSIS — Z13220 Encounter for screening for lipoid disorders: Secondary | ICD-10-CM

## 2022-11-20 NOTE — Telephone Encounter (Addendum)
Called home number, patient's husband answered, asked that we call her at work. Tried work number patient is currently occupied with a patient, will reattempt contact.    ----- Message from Alver Sorrow, NP sent at 11/19/2022  9:00 PM EDT ----- Coronary calcium score of 0 meaning no calcified plaque but there was a small amount of soft plaque. Not enough to cause symptoms, but recommend secondary prevention. Recommend aiming for 150 minutes of moderate intensity activity per week and following a heart healthy diet.    Recommend updating fasting lipid panel within the next few weeks to reassess cholesterol as last checked >1 year ago. This will help [place] to further guide prevention.

## 2022-11-21 NOTE — Telephone Encounter (Signed)
Seen by patient Bynum Bellows on 11/21/2022  4:39 PM; follow up mychart message sent to patient.

## 2022-11-21 NOTE — Telephone Encounter (Signed)
2nd call attempt, no answer, no option to leave VM     ----- Message from Alver Sorrow, NP sent at 11/19/2022  9:00 PM EDT ----- Coronary calcium score of 0 meaning no calcified plaque but there was a small amount of soft plaque. Not enough to cause symptoms, but recommend secondary prevention. Recommend aiming for 150 minutes of moderate intensity activity per week and following a heart healthy diet.     Recommend updating fasting lipid panel within the next few weeks to reassess cholesterol as last checked >1 year ago. This will help [place] to further guide prevention.

## 2022-11-22 NOTE — Addendum Note (Signed)
Addended by: Regis Bill B on: 11/22/2022 05:49 PM   Modules accepted: Orders

## 2022-11-22 NOTE — Telephone Encounter (Signed)
Patient viewed in [facility] and mailed lab orders

## 2022-11-23 ENCOUNTER — Other Ambulatory Visit (HOSPITAL_BASED_OUTPATIENT_CLINIC_OR_DEPARTMENT_OTHER): Payer: PPO

## 2022-11-28 ENCOUNTER — Encounter: Payer: Self-pay | Admitting: Medical

## 2022-11-28 ENCOUNTER — Other Ambulatory Visit (HOSPITAL_BASED_OUTPATIENT_CLINIC_OR_DEPARTMENT_OTHER): Payer: Self-pay

## 2022-11-28 ENCOUNTER — Telehealth (INDEPENDENT_AMBULATORY_CARE_PROVIDER_SITE_OTHER): Payer: PPO | Admitting: Medical

## 2022-11-28 VITALS — BP 155/98 | HR 59 | Ht 66.5 in | Wt 257.0 lb

## 2022-11-28 DIAGNOSIS — W19XXXA Unspecified fall, initial encounter: Secondary | ICD-10-CM | POA: Diagnosis not present

## 2022-11-28 DIAGNOSIS — R110 Nausea: Secondary | ICD-10-CM | POA: Diagnosis not present

## 2022-11-28 DIAGNOSIS — I10 Essential (primary) hypertension: Secondary | ICD-10-CM | POA: Diagnosis not present

## 2022-11-28 DIAGNOSIS — R195 Other fecal abnormalities: Secondary | ICD-10-CM | POA: Diagnosis not present

## 2022-11-28 DIAGNOSIS — J329 Chronic sinusitis, unspecified: Secondary | ICD-10-CM | POA: Diagnosis not present

## 2022-11-28 DIAGNOSIS — R42 Dizziness and giddiness: Secondary | ICD-10-CM | POA: Diagnosis not present

## 2022-11-28 MED ORDER — AZITHROMYCIN 500 MG PO TABS
500.0000 mg | ORAL_TABLET | Freq: Every day | ORAL | 0 refills | Status: AC
  Filled 2022-11-28: qty 3, 3d supply, fill #0

## 2022-11-28 NOTE — Progress Notes (Signed)
Subjective:     Patient ID: Emily [name], female   DOB: 1950/08/18, 72 y.o.   MRN: [id number]  This visit type was conducted due to national recommendations for restrictions regarding the COVID-19 Pandemic (e.g. social distancing) in an effort to limit this patient's exposure and mitigate transmission in our community.  Due to their co-morbid illnesses, this patient is at least at moderate risk for complications without adequate follow up.  This format is felt to be most appropriate for this patient at this time.    Documentation for virtual audio and video telecommunications through [place] encounter:  The patient was located at home. The provider was located in the office. The patient did consent to this visit and is aware of possible charges through their insurance for this visit.  The other persons participating in this telemedicine service were none. Time spent on call was 20 minutes and in review of previous records 20 minutes total.  This virtual service is not related to other E/M service within previous 7 days.   HPI Chief Complaint  Patient presents with   Nausea    VIRTUAL nausea and diarrhea on and off since Sat. Fell in shower Sat in shower and is sore. Had sinus infection last week. Finished zpak and she said it usuall takes 2 of them to get rid of infection.   Virtual consult for a few different concerns.    Had a fall in her shower, 11/25/22.  Felt lightheaded and dizzy in the shower, while turning around in the shower.  Feet went in different directions, and her body fell into bathroom door which then opened and she then fell onto the floor, partly in and partly out of the shower.  No head injury, but maybe for a split second blacked out.   Both knees are sore from hitting the against the door.  That day and now has not had neck pain, on concern for neck injury.    Of note, had knee surgery last year.  Husband helped her get out of the shower.   Was groggy all  that day but still went to work.  Today and last few days having some nausea, some loose stools, several stools throughout Sunday 2 days ago.  Had about 1 loose stool yesterday, and none today.     Just started Valsartan last week for BP not at goal.  Had already been on Chlorthalidone for a while before the new one was added.  However, when she fell ,she cut out the valsartan temporarily as she felt lightened before she fell.  Hasn't been checking BP in recent day.    Still on antibiotics for sinus infection, just finished a round of antibiotic azithromycin.  In the past takes 2 rounds of antibiotic to resolve sinuitis.   Has head congestion, sinus pressure.  Has some ear pressure, sore throat, some cough.   No sick contacts.     No other aggravating or relieving factors. No other complaint.   Past Medical History:  Diagnosis Date   Acute bacterial sinusitis 06/29/2021   Acute non-recurrent ethmoidal sinusitis 08/05/2021   Acute non-recurrent pansinusitis 01/25/2022   Allergy    seasonal    Anxiety    ARTHRALGIA 11/10/2009   Qualifier: Diagnosis of  By: Caryl Never MD, Bruce     Cancer [facility]) 2009   squamous cell skin   Cataract    bilateral surgery, left unsuccessful   Cough 11/23/2020   Cystitis 02/12/2020   Depression  DYSPNEA 11/10/2009   Qualifier: Diagnosis of  By: Caryl Never MD, Bruce     Eating disorder    GERD (gastroesophageal reflux disease)    Glaucoma    Head injury 07/23/2018   Hiatal hernia    History of cardiovascular stress test 04/2000   cardiolyte   History of flexible sigmoidoscopy 09/21/1999   Influenza A 03/13/2022   Other fatigue 02/26/2020   Otitis of both ears 03/08/2020   Sinobronchitis 04/05/2021   WEIGHT GAIN 11/10/2009   Qualifier: Diagnosis of  By: Caryl Never MD, Bruce     Current Outpatient Medications on File Prior to Visit  Medication Sig Dispense Refill   aspirin 81 MG EC tablet Take 1 tablet by mouth twice daily for 2 weeks, then take  1 tablet by mouth once daily for 2 weeks (Patient taking differently: Take 81 mg by mouth daily.) 60 tablet 0   azelastine (OPTIVAR) 0.05 % ophthalmic solution Place 2 drops into both eyes 2 (two) times daily. 6 mL 5   Blood Pressure Monitor MISC Use as directed 1 each 0   buPROPion (WELLBUTRIN XL) 150 MG 24 hr tablet Take 1 tablet (150 mg total) by mouth every morning. 90 tablet 3   Cholecalciferol (VITAMIN D3) 1.25 MG (50000 UT) capsule Take 1 capsule (50,000 Units total) by mouth once a week. 4 capsule 12   cycloSPORINE (RESTASIS) 0.05 % ophthalmic emulsion Instill 1 drop into both eyes twice daily. 180 each PRN   dapagliflozin propanediol (FARXIGA) 10 MG TABS tablet Take 1 tablet (10 mg total) by mouth daily. 90 tablet 3   fluticasone (FLONASE) 50 MCG/ACT nasal spray Place 1 spray into both nostrils daily. 16 g 2   Netarsudil-Latanoprost (ROCKLATAN) 0.02-0.005 % SOLN INSTILL 1 DROP INTO EACH EYE AT BEDTIME 2.5 mL 11   traZODone (DESYREL) 100 MG tablet Take 3 tablets (300 mg total) by mouth at bedtime. 270 tablet 3   albuterol (VENTOLIN HFA) 108 (90 Base) MCG/ACT inhaler Inhale 2 puffs into the lungs every 6 (six) hours as needed for wheezing or shortness of breath. (Patient not taking: Reported on 11/28/2022) 6.7 g 0   ALPRAZolam (XANAX) 1 MG tablet Take 1/2 to 1 tablet by mouth daily as needed (90 day supply) (Patient not taking: Reported on 11/28/2022) 45 tablet 0   chlorthalidone (HYGROTON) 25 MG tablet Take 1 tablet (25 mg total) by mouth daily. (Patient not taking: Reported on 11/28/2022) 30 tablet 12   diphenhydrAMINE (BENADRYL) 25 MG tablet Take 25 mg by mouth every 6 (six) hours as needed. (Patient not taking: Reported on 11/28/2022)     Fluticasone-Umeclidin-Vilant (TRELEGY ELLIPTA) 100-62.5-25 MCG/ACT AEPB Inhale 1 puff into the lungs daily. (Patient not taking: Reported on 11/28/2022) 2 each 0   guaiFENesin (MUCINEX) 600 MG 12 hr tablet Take 600 mg by mouth 2 (two) times daily. (Patient not  taking: Reported on 11/28/2022)     meclizine (ANTIVERT) 25 MG tablet Take 1 tablet (25 mg total) by mouth 3 (three) times daily as needed for dizziness. (Patient not taking: Reported on 11/17/2022) 90 tablet 3   omeprazole (PRILOSEC) 40 MG capsule Take 1 capsule (40 mg total) by mouth daily. (Patient not taking: Reported on 11/28/2022) 90 capsule 0   ondansetron (ZOFRAN) 4 MG tablet Take 1 tablet (4 mg total) by mouth every 6 (six) to 8 (eight) hours as needed. (Patient not taking: Reported on 11/28/2022) 30 tablet 0   No current facility-administered medications on file prior to visit.  Review of Systems As in subjective    Objective:   Physical Exam Due to coronavirus pandemic stay at home measures, patient visit was virtual and they were not examined in person.   BP (!) 155/98   Pulse (!) 59   Ht 5' 6.5" (1.689 m)   Wt 257 lb (116.6 kg)   BMI 40.86 kg/m   Gen: wd, wn, nad No labored breathing or wheezing      Assessment:     Encounter Diagnoses  Name Primary?   Fall, initial encounter Yes   Recurrent sinusitis    Nausea    Loose stools    Lightheaded    Primary hypertension        Plan:      Fall-we discussed that I have limited ability to evaluate her over the virtual consult for this.  She has no particular complaints of pain or head injury or neck or headache today.  She suspects she had a drop in blood pressure and feeling lightheaded since starting a new blood pressure pill valsartan last week.  She had already been on chlorthalidone prior.  I advised that she check her blood pressures 2 or 3 times daily for the next few days and get [place] some readings over the next week to compare since she stopped the valsartan since her fall.  Continue chlorthalidone for now.  Consider in person exam and evaluation  Hypertension-continue chlorthalidone but discontinued valsartan started last week for the time being until she gets some home readings given the recent fall and  possible lightheadedness due to low pressure  Nausea and loose stools-worse 3 days ago but gradually improved over the last 2 days.  No loose stools today.  Likely gastroenteritis.  Hydrate well, consider Pepto-Bismol for a few days.  If new or worse symptoms such as fever, blood in the stool or other then call back  Recurrent sinusitis-she just completed some azithromycin but feels like she has not fully resolved.  A few more days prescribed as below.  Continue Mucinex a few more days, good hydration.   [place] was seen today for nausea.  Diagnoses and all orders for this visit:  Fall, initial encounter  Recurrent sinusitis  Nausea  Loose stools  Lightheaded  Primary hypertension  Other orders -     azithromycin (ZITHROMAX) 500 MG tablet; Take 1 tablet (500 mg total) by mouth daily for 3 days.   F/u 2 wk with PCP SaraBeth NP here

## 2022-12-13 ENCOUNTER — Other Ambulatory Visit (HOSPITAL_BASED_OUTPATIENT_CLINIC_OR_DEPARTMENT_OTHER): Payer: Self-pay

## 2022-12-14 ENCOUNTER — Encounter: Payer: Self-pay | Admitting: Nurse Practitioner

## 2022-12-14 NOTE — Assessment & Plan Note (Signed)
Emily [name] presents with symptoms consistent with sinusitis.  Given the length of time her symptoms have been present we will plan to manage with antibiotics.  Her husband, Emily [name], is also exhibiting similar symptoms. Plan: -Will send antibiotics for both [name] and Emily [name] for management of current infection. -Both are to monitor closely for new or worsening symptoms and report immediately -If symptoms are not showing signs of improvement over the next 3 to 4 days recommend calling the office to let us know.

## 2022-12-14 NOTE — Assessment & Plan Note (Signed)
Emily [name] is experiencing fluctuations in her blood pressure readings, with elevations as the primary symptom. We discussed adding Valsartan and splitting chlorthalidone to see if this helps. We can increase dose, if needed.

## 2022-12-15 ENCOUNTER — Other Ambulatory Visit (HOSPITAL_BASED_OUTPATIENT_CLINIC_OR_DEPARTMENT_OTHER): Payer: Self-pay

## 2022-12-22 ENCOUNTER — Other Ambulatory Visit (HOSPITAL_BASED_OUTPATIENT_CLINIC_OR_DEPARTMENT_OTHER): Payer: Self-pay

## 2022-12-25 ENCOUNTER — Other Ambulatory Visit (HOSPITAL_BASED_OUTPATIENT_CLINIC_OR_DEPARTMENT_OTHER): Payer: PPO

## 2022-12-25 ENCOUNTER — Other Ambulatory Visit (HOSPITAL_BASED_OUTPATIENT_CLINIC_OR_DEPARTMENT_OTHER): Payer: Self-pay

## 2022-12-25 ENCOUNTER — Other Ambulatory Visit: Payer: Self-pay

## 2022-12-25 MED ORDER — ALPRAZOLAM 1 MG PO TABS
0.5000 mg | ORAL_TABLET | Freq: Every day | ORAL | 0 refills | Status: DC | PRN
  Filled 2022-12-25: qty 45, 90d supply, fill #0

## 2022-12-28 ENCOUNTER — Telehealth: Payer: Self-pay | Admitting: Nurse Practitioner

## 2022-12-28 NOTE — Telephone Encounter (Signed)
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

## 2023-01-11 ENCOUNTER — Ambulatory Visit (INDEPENDENT_AMBULATORY_CARE_PROVIDER_SITE_OTHER): Payer: PPO

## 2023-01-11 DIAGNOSIS — R0609 Other forms of dyspnea: Secondary | ICD-10-CM

## 2023-01-11 LAB — ECHOCARDIOGRAM COMPLETE
Area-P 1/2: 3.48 cm2
S' Lateral: 2.76 cm

## 2023-01-29 DIAGNOSIS — D492 Neoplasm of unspecified behavior of bone, soft tissue, and skin: Secondary | ICD-10-CM | POA: Diagnosis not present

## 2023-01-29 DIAGNOSIS — L814 Other melanin hyperpigmentation: Secondary | ICD-10-CM | POA: Diagnosis not present

## 2023-01-29 DIAGNOSIS — D225 Melanocytic nevi of trunk: Secondary | ICD-10-CM | POA: Diagnosis not present

## 2023-01-29 DIAGNOSIS — L821 Other seborrheic keratosis: Secondary | ICD-10-CM | POA: Diagnosis not present

## 2023-01-29 DIAGNOSIS — Z85828 Personal history of other malignant neoplasm of skin: Secondary | ICD-10-CM | POA: Diagnosis not present

## 2023-01-29 DIAGNOSIS — Z08 Encounter for follow-up examination after completed treatment for malignant neoplasm: Secondary | ICD-10-CM | POA: Diagnosis not present

## 2023-01-29 DIAGNOSIS — Z7189 Other specified counseling: Secondary | ICD-10-CM | POA: Diagnosis not present

## 2023-02-03 ENCOUNTER — Other Ambulatory Visit (HOSPITAL_BASED_OUTPATIENT_CLINIC_OR_DEPARTMENT_OTHER): Payer: Self-pay | Admitting: Nurse Practitioner

## 2023-02-03 ENCOUNTER — Other Ambulatory Visit: Payer: Self-pay

## 2023-02-03 DIAGNOSIS — R42 Dizziness and giddiness: Secondary | ICD-10-CM

## 2023-02-05 ENCOUNTER — Other Ambulatory Visit (HOSPITAL_BASED_OUTPATIENT_CLINIC_OR_DEPARTMENT_OTHER): Payer: Self-pay

## 2023-02-05 MED ORDER — MECLIZINE HCL 25 MG PO TABS
25.0000 mg | ORAL_TABLET | Freq: Three times a day (TID) | ORAL | 1 refills | Status: AC | PRN
  Filled 2023-02-05: qty 90, 30d supply, fill #0
  Filled 2023-11-01: qty 90, 30d supply, fill #1

## 2023-02-21 ENCOUNTER — Other Ambulatory Visit (HOSPITAL_BASED_OUTPATIENT_CLINIC_OR_DEPARTMENT_OTHER): Payer: Self-pay

## 2023-02-23 ENCOUNTER — Telehealth (INDEPENDENT_AMBULATORY_CARE_PROVIDER_SITE_OTHER): Payer: PPO | Admitting: Medical

## 2023-02-23 ENCOUNTER — Other Ambulatory Visit (HOSPITAL_BASED_OUTPATIENT_CLINIC_OR_DEPARTMENT_OTHER): Payer: Self-pay

## 2023-02-23 ENCOUNTER — Encounter: Payer: Self-pay | Admitting: Medical

## 2023-02-23 VITALS — Wt 250.0 lb

## 2023-02-23 DIAGNOSIS — R509 Fever, unspecified: Secondary | ICD-10-CM | POA: Diagnosis not present

## 2023-02-23 DIAGNOSIS — U071 COVID-19: Secondary | ICD-10-CM

## 2023-02-23 DIAGNOSIS — Z79899 Other long term (current) drug therapy: Secondary | ICD-10-CM

## 2023-02-23 MED ORDER — PAXLOVID (300/100) 20 X 150 MG & 10 X 100MG PO TBPK
3.0000 | ORAL_TABLET | Freq: Two times a day (BID) | ORAL | 0 refills | Status: AC
  Filled 2023-02-23: qty 30, 5d supply, fill #0

## 2023-02-23 MED ORDER — HYDROCODONE BIT-HOMATROP MBR 5-1.5 MG/5ML PO SOLN
5.0000 mL | Freq: Three times a day (TID) | ORAL | 0 refills | Status: AC | PRN
  Filled 2023-02-23: qty 75, 5d supply, fill #0

## 2023-02-23 NOTE — Progress Notes (Signed)
Subjective:     Patient ID: Emily [name], female   DOB: 09/08/1950, 72 y.o.   MRN: [id number]  This visit type was conducted due to national recommendations for restrictions regarding the COVID-19 Pandemic (e.g. social distancing) in an effort to limit this patient's exposure and mitigate transmission in our community.  Due to their co-morbid illnesses, this patient is at least at moderate risk for complications without adequate follow up.  This format is felt to be most appropriate for this patient at this time.    Documentation for virtual audio and video telecommunications through [place] encounter:  The patient was located at home. The provider was located in the office. The patient did consent to this visit and is aware of possible charges through their insurance for this visit.  The other persons participating in this telemedicine service were none. Time spent on call was 20 minutes and in review of previous records 20 minutes total.  This virtual service is not related to other E/M service within previous 7 days.   HPI Chief Complaint  Patient presents with   possible covid    Possible covid- husband has covid that was seen today with [place]. Symptoms for pt started 3 days. Chills, congestion, cough, fever.    Virtual for illness.   Been sick for 3 days, fever 2-3 days.    She notes cough, congestion, sinus drainage, sinus pressure, chest congestion, chills, fever.  No SOB or wheezing.  Coughing is moderate.  Has sore throat.  Has some headaches.  No NVD.  Husband has been sick with similar but he has started a few days before hers.  He actually came in for med check today at our office and tested positive for COVID.   Using advil cold and flu and sinus, mucinex, benadryl.  No other aggravating or relieving factors. No other complaint.   Past Medical History:  Diagnosis Date   Acute bacterial sinusitis 06/29/2021   Acute non-recurrent ethmoidal sinusitis 08/05/2021    Acute non-recurrent pansinusitis 01/25/2022   Allergy    seasonal    Anxiety    ARTHRALGIA 11/10/2009   Qualifier: Diagnosis of  By: Caryl Never MD, Bruce     Cancer [facility]) 2009   squamous cell skin   Cataract    bilateral surgery, left unsuccessful   Cough 11/23/2020   Cystitis 02/12/2020   Depression    DYSPNEA 11/10/2009   Qualifier: Diagnosis of  By: Caryl Never MD, Bruce     Eating disorder    GERD (gastroesophageal reflux disease)    Glaucoma    Head injury 07/23/2018   Hiatal hernia    History of cardiovascular stress test 04/2000   cardiolyte   History of flexible sigmoidoscopy 09/21/1999   Influenza A 03/13/2022   Other fatigue 02/26/2020   Otitis of both ears 03/08/2020   Sinobronchitis 04/05/2021   WEIGHT GAIN 11/10/2009   Qualifier: Diagnosis of  By: Caryl Never MD, Bruce     Current Outpatient Medications on File Prior to Visit  Medication Sig Dispense Refill   albuterol (VENTOLIN HFA) 108 (90 Base) MCG/ACT inhaler Inhale 2 puffs into the lungs every 6 (six) hours as needed for wheezing or shortness of breath. 6.7 g 0   ALPRAZolam (XANAX) 1 MG tablet Take 0.5-1 tablets (0.5-1 mg total) by mouth daily as needed. This is a 90-day supply 45 tablet 0   aspirin 81 MG EC tablet Take 1 tablet by mouth twice daily for 2 weeks, then take 1 tablet by mouth  once daily for 2 weeks (Patient taking differently: Take 81 mg by mouth daily.) 60 tablet 0   azelastine (OPTIVAR) 0.05 % ophthalmic solution Place 2 drops into both eyes 2 (two) times daily. 6 mL 5   buPROPion (WELLBUTRIN XL) 150 MG 24 hr tablet Take 1 tablet (150 mg total) by mouth every morning. 90 tablet 3   chlorthalidone (HYGROTON) 25 MG tablet Take 1 tablet (25 mg total) by mouth daily. 30 tablet 12   Cholecalciferol (VITAMIN D3) 1.25 MG (50000 UT) capsule Take 1 capsule (50,000 Units total) by mouth once a week. 4 capsule 12   cycloSPORINE (RESTASIS) 0.05 % ophthalmic emulsion Instill 1 drop into both eyes twice daily.  180 each PRN   dapagliflozin propanediol (FARXIGA) 10 MG TABS tablet Take 1 tablet (10 mg total) by mouth daily. 90 tablet 3   diphenhydrAMINE (BENADRYL) 25 MG tablet Take 25 mg by mouth every 6 (six) hours as needed.     fluticasone (FLONASE) 50 MCG/ACT nasal spray Place 1 spray into both nostrils daily. 16 g 2   Fluticasone-Umeclidin-Vilant (TRELEGY ELLIPTA) 100-62.5-25 MCG/ACT AEPB Inhale 1 puff into the lungs daily. 2 each 0   guaiFENesin (MUCINEX) 600 MG 12 hr tablet Take 600 mg by mouth 2 (two) times daily.     meclizine (ANTIVERT) 25 MG tablet Take 1 tablet (25 mg total) by mouth 3 (three) times daily as needed for dizziness. 90 tablet 1   Netarsudil-Latanoprost (ROCKLATAN) 0.02-0.005 % SOLN INSTILL 1 DROP INTO EACH EYE AT BEDTIME 2.5 mL 11   ondansetron (ZOFRAN) 4 MG tablet Take 1 tablet (4 mg total) by mouth every 6 (six) to 8 (eight) hours as needed. 30 tablet 0   traZODone (DESYREL) 100 MG tablet Take 3 tablets (300 mg total) by mouth at bedtime. 270 tablet 3   Blood Pressure Monitor MISC Use as directed 1 each 0   No current facility-administered medications on file prior to visit.    Review of Systems As in subjective    Objective:   Physical Exam Due to coronavirus pandemic stay at home measures, patient visit was virtual and they were not examined in person.   Wt 250 lb (113.4 kg)   BMI 39.75 kg/m   Gen: No acute distress Congested sounding, no wheezing or labored breathing     Assessment:     Encounter Diagnoses  Name Primary?   COVID Yes   Fever, unspecified fever cause    High risk medication use        Plan:      Covid infection, covid illness general recommendations:  I recommend you rest, hydrate well with water and clear fluids throughout the day.  If you feel dry in the mouth, tongue or feel that you are urinating as much as usual, then increase hydration.  You urine should be like yellow to clear, not dark yellow or darker.     Pain, body aches,  or fever: You can use Tylenol /Acetaminophen 325mg  over the counter for pain or fever, every 4-6 hours   Cough: You can use Hycodan prescription medicaiton for cough.   This medication can cause drowsiness so use caution. Don't take this medication and drive or operate machinery.    Drainage and congestion: You can use over the counter antihistamine such as zyrtec, allegra, or benadryl as directed on the label   Shortness of breath or wheezing: You can use Albuterol 1-2 puffs every 4 hours as needed for wheezing, shortness of breath,  chest tightness, or coughing fits.     Nausea: You can use over the counter Emetrol for nausea.     Antiviral medication: Begin medication Paxlovid to help reduce your risk of hospitalization or severe illness.  If medicaiton is not available, too expensive or not covered by insurance, then call us back.    In the next few days, if you are having trouble breathing, if you are very weak, have high fever 103 or higher consistently despite Tylenol, or uncontrollable nausea and vomiting, then call or go to the emergency department.    If you have other questions or have other symptoms or questions you are concerned about then please make a virtual visit   Covid symptoms such as fatigue and cough can linger over 2 weeks, even after the initial fever, aches, chills, and other initial symptoms.   Self Quarantine: The CDC, Centers for Disease Control has recommended a self quarantine of 5 days from the start of your illness until you are symptom-free including at least 24 hours of no symptoms including no fever, no shortness of breath, and no body aches and chills, by day 5 before returning to work or general contact with the public.  What does self quarantine mean: avoiding contact with people as much as possible.   Particularly in your house, isolate your self from others in a separate room, wear a mask when possible in the room, particularly if coughing a  lot.   Have others bring food, water, medications, etc., to your door, but avoid direct contact with your household contacts during this time to avoid spreading the infection to them.   If you have a separate bathroom and living quarters during the next 2 weeks away from others, that would be preferable.    If you can't completely isolate, then wear a mask, wash hands frequently with soap and water for at least 15 seconds, minimize close contact with others, and have a friend or family member check regularly from a distance to make sure you are not getting seriously worse.     You should not be going out in public, should not be going to stores, to work or other public places until all your symptoms have resolved and at least 5 days + 24 hours of no symptoms at all have transpired.   Ideally you should avoid contact with others for a full 5 days if possible.  One of the goals is to limit spread to high risk people; people that are older and elderly, people with multiple health issues like diabetes, heart disease, lung disease, and anybody that has weakened immune systems such as people with cancer or on immunosuppressive therapy.       [place] was seen today for possible covid.  Diagnoses and all orders for this visit:  COVID  Fever, unspecified fever cause  High risk medication use  Other orders -     nirmatrelvir & ritonavir (PAXLOVID, 300/100,) 20 x 150 MG & 10 x 100MG  TBPK; Take 3 tablets by mouth 2 (two) times daily for 5 days. -     HYDROcodone bit-homatropine (HYCODAN) 5-1.5 MG/5ML syrup; Take 5 mLs by mouth every 8 (eight) hours as needed for up to 5 days for cough.  F/u prn

## 2023-02-26 ENCOUNTER — Telehealth: Payer: PPO | Admitting: Medical

## 2023-02-26 ENCOUNTER — Other Ambulatory Visit (HOSPITAL_BASED_OUTPATIENT_CLINIC_OR_DEPARTMENT_OTHER): Payer: Self-pay

## 2023-03-01 ENCOUNTER — Encounter (HOSPITAL_COMMUNITY): Payer: Self-pay | Admitting: Emergency Medicine

## 2023-03-01 ENCOUNTER — Inpatient Hospital Stay (HOSPITAL_COMMUNITY)
Admission: EM | Admit: 2023-03-01 | Discharge: 2023-03-04 | DRG: 177 | Disposition: A | Discharge status: Home / Self Care | Payer: PPO | Attending: Internal Medicine | Admitting: Internal Medicine

## 2023-03-01 ENCOUNTER — Emergency Department (HOSPITAL_COMMUNITY): Payer: PPO

## 2023-03-01 ENCOUNTER — Telehealth: Payer: Self-pay

## 2023-03-01 ENCOUNTER — Other Ambulatory Visit: Payer: Self-pay

## 2023-03-01 DIAGNOSIS — R4182 Altered mental status, unspecified: Principal | ICD-10-CM

## 2023-03-01 DIAGNOSIS — R42 Dizziness and giddiness: Secondary | ICD-10-CM | POA: Diagnosis not present

## 2023-03-01 DIAGNOSIS — R000 Tachycardia, unspecified: Secondary | ICD-10-CM | POA: Diagnosis not present

## 2023-03-01 DIAGNOSIS — F411 Generalized anxiety disorder: Secondary | ICD-10-CM | POA: Diagnosis present

## 2023-03-01 DIAGNOSIS — R410 Disorientation, unspecified: Secondary | ICD-10-CM | POA: Diagnosis not present

## 2023-03-01 DIAGNOSIS — J328 Other chronic sinusitis: Secondary | ICD-10-CM | POA: Diagnosis present

## 2023-03-01 DIAGNOSIS — R6510 Systemic inflammatory response syndrome (SIRS) of non-infectious origin without acute organ dysfunction: Secondary | ICD-10-CM | POA: Insufficient documentation

## 2023-03-01 DIAGNOSIS — R0902 Hypoxemia: Secondary | ICD-10-CM | POA: Diagnosis not present

## 2023-03-01 DIAGNOSIS — J329 Chronic sinusitis, unspecified: Secondary | ICD-10-CM

## 2023-03-01 DIAGNOSIS — R55 Syncope and collapse: Secondary | ICD-10-CM

## 2023-03-01 DIAGNOSIS — U071 COVID-19: Principal | ICD-10-CM | POA: Insufficient documentation

## 2023-03-01 DIAGNOSIS — E119 Type 2 diabetes mellitus without complications: Secondary | ICD-10-CM | POA: Diagnosis present

## 2023-03-01 DIAGNOSIS — E860 Dehydration: Secondary | ICD-10-CM | POA: Diagnosis present

## 2023-03-01 DIAGNOSIS — K449 Diaphragmatic hernia without obstruction or gangrene: Secondary | ICD-10-CM | POA: Diagnosis not present

## 2023-03-01 DIAGNOSIS — E669 Obesity, unspecified: Secondary | ICD-10-CM | POA: Diagnosis present

## 2023-03-01 DIAGNOSIS — Z888 Allergy status to other drugs, medicaments and biological substances status: Secondary | ICD-10-CM

## 2023-03-01 DIAGNOSIS — I10 Essential (primary) hypertension: Secondary | ICD-10-CM | POA: Diagnosis present

## 2023-03-01 DIAGNOSIS — W19XXXA Unspecified fall, initial encounter: Secondary | ICD-10-CM | POA: Diagnosis present

## 2023-03-01 DIAGNOSIS — Z66 Do not resuscitate: Secondary | ICD-10-CM | POA: Diagnosis present

## 2023-03-01 DIAGNOSIS — R4189 Other symptoms and signs involving cognitive functions and awareness: Secondary | ICD-10-CM | POA: Diagnosis present

## 2023-03-01 DIAGNOSIS — R918 Other nonspecific abnormal finding of lung field: Secondary | ICD-10-CM | POA: Diagnosis not present

## 2023-03-01 DIAGNOSIS — J9601 Acute respiratory failure with hypoxia: Secondary | ICD-10-CM | POA: Diagnosis present

## 2023-03-01 DIAGNOSIS — R5383 Other fatigue: Secondary | ICD-10-CM | POA: Diagnosis not present

## 2023-03-01 DIAGNOSIS — R531 Weakness: Secondary | ICD-10-CM | POA: Diagnosis not present

## 2023-03-01 DIAGNOSIS — R9389 Abnormal findings on diagnostic imaging of other specified body structures: Secondary | ICD-10-CM | POA: Diagnosis not present

## 2023-03-01 DIAGNOSIS — Z881 Allergy status to other antibiotic agents status: Secondary | ICD-10-CM

## 2023-03-01 DIAGNOSIS — Z880 Allergy status to penicillin: Secondary | ICD-10-CM

## 2023-03-01 DIAGNOSIS — Z90710 Acquired absence of both cervix and uterus: Secondary | ICD-10-CM

## 2023-03-01 DIAGNOSIS — Z7982 Long term (current) use of aspirin: Secondary | ICD-10-CM

## 2023-03-01 DIAGNOSIS — Z79899 Other long term (current) drug therapy: Secondary | ICD-10-CM

## 2023-03-01 DIAGNOSIS — J4489 Other specified chronic obstructive pulmonary disease: Secondary | ICD-10-CM | POA: Diagnosis present

## 2023-03-01 DIAGNOSIS — M4802 Spinal stenosis, cervical region: Secondary | ICD-10-CM | POA: Insufficient documentation

## 2023-03-01 DIAGNOSIS — F339 Major depressive disorder, recurrent, unspecified: Secondary | ICD-10-CM | POA: Diagnosis present

## 2023-03-01 DIAGNOSIS — K760 Fatty (change of) liver, not elsewhere classified: Secondary | ICD-10-CM | POA: Diagnosis present

## 2023-03-01 DIAGNOSIS — H409 Unspecified glaucoma: Secondary | ICD-10-CM | POA: Diagnosis present

## 2023-03-01 DIAGNOSIS — Z6841 Body Mass Index (BMI) 40.0 and over, adult: Secondary | ICD-10-CM

## 2023-03-01 DIAGNOSIS — R0602 Shortness of breath: Secondary | ICD-10-CM | POA: Diagnosis not present

## 2023-03-01 HISTORY — DX: Chronic sinusitis, unspecified: J32.9

## 2023-03-01 HISTORY — DX: Systemic inflammatory response syndrome (sirs) of non-infectious origin without acute organ dysfunction: R65.10

## 2023-03-01 LAB — I-STAT VENOUS BLOOD GAS, ED
Acid-Base Excess: 4 mmol/L — ABNORMAL HIGH (ref 0.0–2.0)
Bicarbonate: 32.4 mmol/L — ABNORMAL HIGH (ref 20.0–28.0)
Calcium, Ion: 1.17 mmol/L (ref 1.15–1.40)
HCT: 43 % (ref 36.0–46.0)
Hemoglobin: 14.6 g/dL (ref 12.0–15.0)
O2 Saturation: 71 %
Potassium: 4.6 mmol/L (ref 3.5–5.1)
Sodium: 138 mmol/L (ref 135–145)
TCO2: 34 mmol/L — ABNORMAL HIGH (ref 22–32)
pCO2, Ven: 62.4 mmHg — ABNORMAL HIGH (ref 44–60)
pH, Ven: 7.323 (ref 7.25–7.43)
pO2, Ven: 41 mmHg (ref 32–45)

## 2023-03-01 LAB — CBC WITH DIFFERENTIAL/PLATELET
Abs Immature Granulocytes: 0.14 10*3/uL — ABNORMAL HIGH (ref 0.00–0.07)
Basophils Absolute: 0 10*3/uL (ref 0.0–0.1)
Basophils Relative: 0 %
Eosinophils Absolute: 0.1 10*3/uL (ref 0.0–0.5)
Eosinophils Relative: 1 %
HCT: 44.6 % (ref 36.0–46.0)
Hemoglobin: 14.3 g/dL (ref 12.0–15.0)
Immature Granulocytes: 1 %
Lymphocytes Relative: 6 %
Lymphs Abs: 0.8 10*3/uL (ref 0.7–4.0)
MCH: 30 pg (ref 26.0–34.0)
MCHC: 32.1 g/dL (ref 30.0–36.0)
MCV: 93.7 fL (ref 80.0–100.0)
Monocytes Absolute: 0.8 10*3/uL (ref 0.1–1.0)
Monocytes Relative: 6 %
Neutro Abs: 10.3 10*3/uL — ABNORMAL HIGH (ref 1.7–7.7)
Neutrophils Relative %: 86 %
Platelets: 268 10*3/uL (ref 150–400)
RBC: 4.76 MIL/uL (ref 3.87–5.11)
RDW: 12.8 % (ref 11.5–15.5)
WBC: 12.1 10*3/uL — ABNORMAL HIGH (ref 4.0–10.5)
nRBC: 0 % (ref 0.0–0.2)

## 2023-03-01 LAB — COMPREHENSIVE METABOLIC PANEL
ALT: 22 U/L (ref 0–44)
AST: 19 U/L (ref 15–41)
Albumin: 3.3 g/dL — ABNORMAL LOW (ref 3.5–5.0)
Alkaline Phosphatase: 49 U/L (ref 38–126)
Anion gap: 9 (ref 5–15)
BUN: 12 mg/dL (ref 8–23)
CO2: 29 mmol/L (ref 22–32)
Calcium: 8.8 mg/dL — ABNORMAL LOW (ref 8.9–10.3)
Chloride: 98 mmol/L (ref 98–111)
Creatinine, Ser: 0.96 mg/dL (ref 0.44–1.00)
GFR, Estimated: >60 mL/min (ref >60)
Glucose, Bld: 153 mg/dL — ABNORMAL HIGH (ref 70–99)
Potassium: 4.6 mmol/L (ref 3.5–5.1)
Sodium: 136 mmol/L (ref 135–145)
Total Bilirubin: 0.5 mg/dL (ref 0.3–1.2)
Total Protein: 6.5 g/dL (ref 6.5–8.1)

## 2023-03-01 LAB — URINALYSIS, ROUTINE W REFLEX MICROSCOPIC
Bilirubin Urine: NEGATIVE
Glucose, UA: NEGATIVE mg/dL
Hgb urine dipstick: NEGATIVE
Ketones, ur: NEGATIVE mg/dL
Leukocytes,Ua: NEGATIVE
Nitrite: NEGATIVE
Protein, ur: NEGATIVE mg/dL
Specific Gravity, Urine: 1.018 (ref 1.005–1.030)
pH: 5 (ref 5.0–8.0)

## 2023-03-01 LAB — CBG MONITORING, ED: Glucose-Capillary: 152 mg/dL — ABNORMAL HIGH (ref 70–99)

## 2023-03-01 LAB — CREATININE, SERUM
Creatinine, Ser: 0.98 mg/dL (ref 0.44–1.00)
GFR, Estimated: >60 mL/min (ref >60)

## 2023-03-01 LAB — D-DIMER, QUANTITATIVE: D-Dimer, Quant: 0.61 ug{FEU}/mL — ABNORMAL HIGH (ref 0.00–0.50)

## 2023-03-01 LAB — SARS CORONAVIRUS 2 BY RT PCR: SARS Coronavirus 2 by RT PCR: POSITIVE — AB

## 2023-03-01 MED ORDER — IOHEXOL 350 MG/ML SOLN
75.0000 mL | Freq: Once | INTRAVENOUS | Status: AC | PRN
  Administered 2023-03-01: 75 mL via INTRAVENOUS

## 2023-03-01 MED ORDER — ACETAMINOPHEN 325 MG PO TABS
650.0000 mg | ORAL_TABLET | Freq: Once | ORAL | Status: AC
  Administered 2023-03-01: 650 mg via ORAL
  Filled 2023-03-01: qty 2

## 2023-03-01 MED ORDER — FLUTICASONE PROPIONATE 50 MCG/ACT NA SUSP
1.0000 | Freq: Every day | NASAL | Status: DC
  Administered 2023-03-02 – 2023-03-04 (×2): 1 via NASAL
  Filled 2023-03-01: qty 16

## 2023-03-01 MED ORDER — SODIUM CHLORIDE 0.9% FLUSH
3.0000 mL | Freq: Two times a day (BID) | INTRAVENOUS | Status: DC
  Administered 2023-03-01 – 2023-03-04 (×5): 3 mL via INTRAVENOUS

## 2023-03-01 MED ORDER — CYCLOSPORINE 0.05 % OP EMUL
1.0000 [drp] | Freq: Two times a day (BID) | OPHTHALMIC | Status: DC
  Administered 2023-03-01 – 2023-03-04 (×6): 1 [drp] via OPHTHALMIC
  Filled 2023-03-01 (×6): qty 30

## 2023-03-01 MED ORDER — ACETAMINOPHEN 650 MG RE SUPP: 650.0000 mg | Freq: Four times a day (QID) | RECTAL | Status: DC | PRN

## 2023-03-01 MED ORDER — POLYETHYLENE GLYCOL 3350 17 G PO PACK: 17.0000 g | PACK | Freq: Every day | ORAL | Status: DC | PRN

## 2023-03-01 MED ORDER — OXYCODONE HCL 5 MG PO TABS: 5.0000 mg | ORAL_TABLET | Freq: Four times a day (QID) | ORAL | Status: DC | PRN

## 2023-03-01 MED ORDER — ENOXAPARIN SODIUM 40 MG/0.4ML IJ SOSY
40.0000 mg | PREFILLED_SYRINGE | INTRAMUSCULAR | Status: DC
  Administered 2023-03-01 – 2023-03-03 (×3): 40 mg via SUBCUTANEOUS
  Filled 2023-03-01 (×3): qty 0.4

## 2023-03-01 MED ORDER — CLINDAMYCIN HCL 300 MG PO CAPS
300.0000 mg | ORAL_CAPSULE | Freq: Three times a day (TID) | ORAL | Status: DC
  Administered 2023-03-01 – 2023-03-02 (×2): 300 mg via ORAL
  Filled 2023-03-01 (×3): qty 1

## 2023-03-01 MED ORDER — ACETAMINOPHEN 325 MG PO TABS: 650.0000 mg | ORAL_TABLET | Freq: Four times a day (QID) | ORAL | Status: DC | PRN

## 2023-03-01 MED ORDER — NETARSUDIL-LATANOPROST 0.02-0.005 % OP SOLN: 1.0000 [drp] | Freq: Every day | OPHTHALMIC | Status: DC

## 2023-03-01 MED ORDER — ONDANSETRON HCL 4 MG/2ML IJ SOLN: 4.0000 mg | Freq: Four times a day (QID) | INTRAMUSCULAR | Status: DC | PRN

## 2023-03-01 MED ORDER — GUAIFENESIN ER 600 MG PO TB12
1200.0000 mg | ORAL_TABLET | Freq: Two times a day (BID) | ORAL | Status: DC
  Administered 2023-03-01 – 2023-03-04 (×6): 1200 mg via ORAL
  Filled 2023-03-01 (×6): qty 2

## 2023-03-01 MED ORDER — SODIUM CHLORIDE 0.9 % IV SOLN: INTRAVENOUS | Status: AC

## 2023-03-01 MED ORDER — KETOTIFEN FUMARATE 0.035 % OP SOLN
2.0000 [drp] | Freq: Two times a day (BID) | OPHTHALMIC | Status: DC
  Administered 2023-03-01 – 2023-03-04 (×6): 2 [drp] via OPHTHALMIC
  Filled 2023-03-01: qty 5

## 2023-03-01 MED ORDER — TRAZODONE HCL 50 MG PO TABS
200.0000 mg | ORAL_TABLET | Freq: Every day | ORAL | Status: DC
  Administered 2023-03-01 – 2023-03-03 (×3): 200 mg via ORAL
  Filled 2023-03-01 (×3): qty 4

## 2023-03-01 MED ORDER — BUPROPION HCL ER (XL) 150 MG PO TB24
150.0000 mg | ORAL_TABLET | Freq: Every morning | ORAL | Status: DC
  Administered 2023-03-02 – 2023-03-04 (×3): 150 mg via ORAL
  Filled 2023-03-01 (×3): qty 1

## 2023-03-01 MED ORDER — METHYLPREDNISOLONE SODIUM SUCC 125 MG IJ SOLR
125.0000 mg | Freq: Once | INTRAMUSCULAR | Status: AC
  Administered 2023-03-01: 125 mg via INTRAVENOUS
  Filled 2023-03-01: qty 2

## 2023-03-01 MED ORDER — ASPIRIN 81 MG PO TBEC
81.0000 mg | DELAYED_RELEASE_TABLET | Freq: Every day | ORAL | Status: DC
  Administered 2023-03-02 – 2023-03-04 (×3): 81 mg via ORAL
  Filled 2023-03-01 (×3): qty 1

## 2023-03-01 MED ORDER — IPRATROPIUM-ALBUTEROL 0.5-2.5 (3) MG/3ML IN SOLN: 3.0000 mL | RESPIRATORY_TRACT | Status: DC | PRN

## 2023-03-01 MED ORDER — ONDANSETRON HCL 4 MG PO TABS: 4.0000 mg | ORAL_TABLET | Freq: Four times a day (QID) | ORAL | Status: DC | PRN

## 2023-03-01 NOTE — H&P (Addendum)
HISTORY AND PHYSICAL    Emily [name]   ZOX:[id number] DOB: [birth year]   Date of Service: 03/01/23 Requesting physician/APP from ED:   PCP: Tollie Eth, NP     HPI: Emily [name] is a 72 y.o. female w/ PMH vertigo on meclizine prn, anxiety/depression on home Xanax Wellbutrin Trazodone, history asthma/COPD on home albuterol prn and Trelegy, sinus/allergies on Flonase, HTN on chlorthalidone, DM2 on home farxiga, takes ASA.   Pt presents via EMS from home after falling this morning and was unable to get back up on her own. (+)COVID d/ctx by PCP via virtual visit (her husband was positive for COVID), Paxlovid starting 09/06. Her symptoms started around 09/03 w/ cough, congestion, sinus drainage/pressure, chest congestions, chills, fever, moderate cough. She did not tolerate Paxlovid and did not complete course d/t GI side effects. Also given Rx for Hycodan, albuterol,  also advised for OTC antihistamine, acetaminophen.   Presents to ED 03/01/23 via EMS d/t syncope/fall, AMS. Per EMS, patient was satting at 91% on room air; improved to 96% on 4L [place]. In ED, 93% on RA improved on 2L. In ED, barelly meeting sepsis criteria w/ WBC 12.1, Hr 111-104. BP 130s/70s, temp not quite febrile but up at 99.5F. Other labs non-concerning including CMP, CBC, UA. EKG sinus tach. D-Dimer (+), PE r/o on CTA chest. CTA chest notes upper lobe patchiness concerning for infection/inflammation. Given fall, obtained CT head which was non-concerning, CT C-spine (+)chornic spondylosis and severe spinal canal stenosis. On exam, notable confusion w/ slow response to questioning, oriented to self/place. In ED, tx w/ tylenol and w/u as above.     Consultants:  none  Procedures: none      ASSESSMENT & PLAN:   Principal Problem:   Syncope Active Problems:   Depression, recurrent (HCC)   Hepatic steatosis   Hypertension   Brain fog   Generalized anxiety disorder   Vertigo, intermittent    COVID-19 virus infection   SIRS (systemic inflammatory response syndrome) vs sepsis d/t COVID   Sinusitis   Degenerative cervical spinal stenosis   Syncope SIRS / question sepsis Likely vasovagal/orthostatic d/t COVID related weakness History of vertigo  Recent Echo, EF 55-60 and G1DD 12/2022, will not repeat Carotid US  Telemetry Orthostatic VS Fluid hydration   Sinusitis Recurrent Clindamycin d/t allergy to PCN and doxy  Continue home Flonase  Acute Hypoxic respiratory failure d/t COVID vs COPD  Supplemental O2 Treat underlying causes  Supportive care: guaifenesin   Asthma/COPD DuoNeb prn  O2  Steroids given low O2 and COVID  COVID Likely contributing to syncope/weakness and brain fog  Steroids given low O2 and concern possible COPD Will not given antivirals, outside window   HTN Holding home chlorthalidone and farxiga for now   Glaucoma Conitnue home eye drops       Obesity based on BMI: Body mass index is 40.21 kg/m.   DVT prophylaxis: lovenox  IV fluids: NS 1L continuous IV fluids  for now  Nutrition: regular diet  Central lines / invasive devices: none  Code Status: DNR d/w patient and her husband at bedside on admission  ACP documentation reviewed: none on file in [facility]  TOC needs: none at this time, may need PT/OT eval HH? Barriers to dispo / significant pending items: hopeful for discharge tomorrow if clinically improved and no events on tele / no concerns on labs / O2 improving / carotid US reassuring  Review of Systems:  Review of Systems  Constitutional:  Positive for malaise/fatigue. Negative for chills and fever.  HENT:  Positive for congestion and sinus pain. Negative for sore throat.   Respiratory:  Positive for cough and shortness of breath. Negative for hemoptysis and sputum production.   Cardiovascular:  Negative for chest pain, orthopnea, claudication and leg swelling.  Gastrointestinal:   Negative for abdominal pain, blood in stool, constipation, diarrhea, heartburn, nausea and vomiting.  Genitourinary:  Negative for frequency and urgency.  Musculoskeletal:  Positive for falls and neck pain. Negative for back pain and joint pain.  Neurological:  Positive for dizziness. Negative for focal weakness and loss of consciousness.       has a past medical history of Acute bacterial sinusitis (06/29/2021), Acute non-recurrent ethmoidal sinusitis (08/05/2021), Acute non-recurrent pansinusitis (01/25/2022), Allergy, Anxiety, ARTHRALGIA (11/10/2009), Cancer (HCC) (2009), Cataract, Cough (11/23/2020), Cystitis (02/12/2020), Depression, DYSPNEA (11/10/2009), Eating disorder, GERD (gastroesophageal reflux disease), Glaucoma, Head injury (07/23/2018), Hiatal hernia, History of cardiovascular stress test (04/2000), History of flexible sigmoidoscopy (09/21/1999), Influenza A (03/13/2022), Other fatigue (02/26/2020), Otitis of both ears (03/08/2020), Sinobronchitis (04/05/2021), and WEIGHT GAIN (11/10/2009). (Not in an outpatient encounter)   Allergies  Allergen Reactions   Doxycycline     Gi upset   Penicillins     REACTION: rash, aggression Has patient had a PCN reaction causing immediate rash, facial/tongue/throat swelling, SOB or lightheadedness with hypotension: YES Has patient had a PCN reaction causing severe rash involving mucus membranes or skin necrosis: NO Has patient had a PCN reaction that required hospitalizationNO Has patient had a PCN reaction occurring within the last 10 years:NO If all of the above answers are "NO", then may proceed with Cephalosporin use.    Prednisone     REACTION: agressive behavior      family history includes Arthritis in an other family member; Cancer in an other family member; Coronary artery disease in an other family member; Stroke in an other family member. Past Surgical History:  Procedure Laterality Date   ABDOMINAL HYSTERECTOMY      APPENDECTOMY     CARDIAC CATHETERIZATION  04/2000   CARDIAC CATHETERIZATION     COLONOSCOPY  12/31/09   UPPER GASTROINTESTINAL ENDOSCOPY  09/15/1999   WISDOM TOOTH EXTRACTION            Objective Findings:  Vitals:   03/01/23 1400 03/01/23 1500 03/01/23 1554 03/01/23 1627  BP:  139/62 103/64 121/73  Pulse: 77 86 70 66  Resp: 12 19 20    Temp:  98.5 F (36.9 C) 97.9 F (36.6 C)   TempSrc:  Oral Oral Oral  SpO2: 98% 99% 100% 93%  Weight:      Height:        Intake/Output Summary (Last 24 hours) at 03/01/2023 1916 Last data filed at 03/01/2023 0854 Gross per 24 hour  Intake 75 ml  Output --  Net 75 ml   Filed Weights   03/01/23 0858  Weight: 113 kg    Examination:  Physical Exam Constitutional:      General: She is not in acute distress.    Appearance: She is obese.  Cardiovascular:     Rate and Rhythm: Normal rate and regular rhythm.  Pulmonary:     Effort: Pulmonary effort is normal.     Breath sounds: No rales.  Abdominal:     General: Abdomen is flat. Bowel sounds are normal.     Palpations: Abdomen is soft.  Musculoskeletal:     Cervical back:  Normal range of motion.     Right lower leg: No edema.     Left lower leg: No edema.  Skin:    General: Skin is warm and dry.  Neurological:     General: No focal deficit present.     Mental Status: She is alert.     Sensory: No sensory deficit.     Motor: No weakness.     Comments: Oriented to place, situation, year but not month (says it's August)  Psychiatric:        Mood and Affect: Mood normal.        Behavior: Behavior normal.          Scheduled Medications:   [START ON 03/02/2023] aspirin EC  81 mg Oral Daily   [START ON 03/02/2023] buPROPion  150 mg Oral q morning   cycloSPORINE  1 drop Both Eyes BID   enoxaparin (LOVENOX) injection  40 mg Subcutaneous Q24H   [START ON 03/02/2023] fluticasone  1 spray Each Nare Daily   guaiFENesin  1,200 mg Oral BID   ketotifen  2 drop Both Eyes BID    Netarsudil-Latanoprost  1 drop Both Eyes QHS   sodium chloride flush  3 mL Intravenous Q12H   traZODone  200 mg Oral QHS    Continuous Infusions:  sodium chloride 100 mL/hr at 03/01/23 1718    PRN Medications:  acetaminophen **OR** acetaminophen, ipratropium-albuterol, ondansetron **OR** ondansetron (ZOFRAN) IV, oxyCODONE, polyethylene glycol  Antimicrobials:  Anti-infectives (From admission, onward)    None           Data Reviewed: I have personally reviewed following labs and imaging studies  CBC: Recent Labs  Lab 03/01/23 0925 03/01/23 0937  WBC 12.1*  --   NEUTROABS 10.3*  --   HGB 14.3 14.6  HCT 44.6 43.0  MCV 93.7  --   PLT 268  --    Basic Metabolic Panel: Recent Labs  Lab 03/01/23 0925 03/01/23 0937  NA 136 138  K 4.6 4.6  CL 98  --   CO2 29  --   GLUCOSE 153*  --   BUN 12  --   CREATININE 0.98  0.96  --   CALCIUM 8.8*  --    GFR: Estimated Creatinine Clearance: 67.6 mL/min (by C-G formula based on SCr of 0.96 mg/dL). Liver Function Tests: Recent Labs  Lab 03/01/23 0925  AST 19  ALT 22  ALKPHOS 49  BILITOT 0.5  PROT 6.5  ALBUMIN 3.3*   No results for input(s): "LIPASE", "AMYLASE" in the last 168 hours. No results for input(s): "AMMONIA" in the last 168 hours. Coagulation Profile: No results for input(s): "INR", "PROTIME" in the last 168 hours. Cardiac Enzymes: No results for input(s): "CKTOTAL", "CKMB", "CKMBINDEX", "TROPONINI" in the last 168 hours. BNP (last 3 results) Recent Labs    08/02/22 1112  PROBNP 25.0   HbA1C: No results for input(s): "HGBA1C" in the last 72 hours. CBG: Recent Labs  Lab 03/01/23 0922  GLUCAP 152*   Lipid Profile: No results for input(s): "CHOL", "HDL", "LDLCALC", "TRIG", "CHOLHDL", "LDLDIRECT" in the last 72 hours. Thyroid Function Tests: No results for input(s): "TSH", "T4TOTAL", "FREET4", "T3FREE", "THYROIDAB" in the last 72 hours. Anemia Panel: No results for input(s): "VITAMINB12",  "FOLATE", "FERRITIN", "TIBC", "IRON", "RETICCTPCT" in the last 72 hours. Most Recent Urinalysis On File:     Component Value Date/Time   COLORURINE YELLOW 03/01/2023 0948   APPEARANCEUR CLEAR 03/01/2023 0948   LABSPEC 1.018 03/01/2023 1610  PHURINE 5.0 03/01/2023 0948   GLUCOSEU NEGATIVE 03/01/2023 0948   HGBUR NEGATIVE 03/01/2023 0948   HGBUR negative 11/11/2009 0841   BILIRUBINUR NEGATIVE 03/01/2023 0948   BILIRUBINUR negative 02/12/2020 1502   KETONESUR NEGATIVE 03/01/2023 0948   PROTEINUR NEGATIVE 03/01/2023 0948   UROBILINOGEN 0.2 02/12/2020 1502   UROBILINOGEN 0.2 10/04/2014 1437   NITRITE NEGATIVE 03/01/2023 0948   LEUKOCYTESUR NEGATIVE 03/01/2023 0948   Sepsis Labs: @LABRCNTIP (procalcitonin:4,lacticidven:4)  Recent Results (from the past 240 hour(s))  SARS Coronavirus 2 by RT PCR (hospital order, performed in [facility] hospital lab) *cepheid single result test* Anterior Nasal Swab     Status: Abnormal   Collection Time: 03/01/23  9:02 AM   Specimen: Anterior Nasal Swab  Result Value Ref Range Status   SARS Coronavirus 2 by RT PCR POSITIVE (A) NEGATIVE Final    Comment: Performed at [facility], [address], [place], [place] [address]         Radiology Studies: CT Angio Chest PE W and/or Wo Contrast  Result Date: 03/01/2023 CLINICAL DATA:  Pt got dizzy while in the shower and then had a syncopal episode. Pt is positive for covid. Also having fatigue EXAM: CT ANGIOGRAPHY CHEST WITH CONTRAST TECHNIQUE: Multidetector CT imaging of the chest was performed using the standard protocol during bolus administration of intravenous contrast. Multiplanar CT image reconstructions and MIPs were obtained to evaluate the vascular anatomy. RADIATION DOSE REDUCTION: This exam was performed according to the departmental dose-optimization program which includes automated exposure control, adjustment of the mA and/or kV according to patient size and/or use of iterative  reconstruction technique. CONTRAST:  75mL OMNIPAQUE IOHEXOL 350 MG/ML SOLN COMPARISON:  Chest x-ray 02/28/2023 FINDINGS: Cardiovascular: Satisfactory opacification of the pulmonary arteries to the segmental level. No evidence of pulmonary embolism. Limited evaluation at subsegmental level due to motion artifact. The main pulmonary artery is normal in caliber. Normal heart size. No significant pericardial effusion. The thoracic aorta is normal in caliber. No atherosclerotic plaque of the thoracic aorta. No coronary artery calcifications. Mediastinum/Nodes: No enlarged mediastinal, hilar, or axillary lymph nodes. Thyroid gland, trachea, and esophagus demonstrate no significant findings. Tiny hiatal hernia. Lungs/Pleura: Patchy peribronchovascular bilateral upper lobe airspace opacities. No pulmonary nodule. No pulmonary mass. No pleural effusion. No pneumothorax. Upper Abdomen: No acute abnormality. Musculoskeletal: No chest wall abnormality. No suspicious lytic or blastic osseous lesions. No acute displaced fracture. Review of the MIP images confirms the above findings. IMPRESSION: 1. No central or segmental pulmonary embolus. Limited evaluation at the subsegmental level due to motion artifact. 2. Bilateral upper lobe peribronchovascular patchy airspace opacity suggestive of infection/inflammation. 3. Tiny hiatal hernia. Electronically Signed   By: Tish Frederickson M.D.   On: 03/01/2023 12:44   CT HEAD WO CONTRAST  Result Date: 03/01/2023 CLINICAL DATA:  Provided history: Neck trauma. Facial trauma, blunt. Additional history provided: Dizziness with subsequent syncopal episode in shower earlier today, COVID positive, lethargy, confusion. EXAM: CT HEAD WITHOUT CONTRAST CT CERVICAL SPINE WITHOUT CONTRAST TECHNIQUE: Multidetector CT imaging of the head and cervical spine was performed following the standard protocol without intravenous contrast. Multiplanar CT image reconstructions of the cervical spine were also  generated. RADIATION DOSE REDUCTION: This exam was performed according to the departmental dose-optimization program which includes automated exposure control, adjustment of the mA and/or kV according to patient size and/or use of iterative reconstruction technique. COMPARISON:  Head CT 03/29/2022. Brain MRI 09/13/2009. Cervical spine CT 07/17/2018. FINDINGS: CT HEAD FINDINGS Brain: No age advanced or  lobar predominant parenchymal atrophy. There is no acute intracranial hemorrhage. No demarcated cortical infarct. No extra-axial fluid collection. No evidence of an intracranial mass. No midline shift. Vascular: No hyperdense vessel. Skull: No calvarial fracture or aggressive osseous lesion. Sinuses/Orbits: No mass or acute finding within the imaged orbits. Mild mucosal thickening within the bilateral maxillary sinuses at the imaged levels. Mild-to-moderate mucosal thickening within the bilateral sphenoid sinuses. Mucosal thickening within bilateral ethmoid air cells, overall moderate in severity. Mild mucosal thickening within the bilateral frontal sinuses. Other: Small-volume fluid within the right mastoid air cells. CT CERVICAL SPINE FINDINGS Alignment: Dextrocurvature of the cervical spine. 2 mm C2-C3 grade 1 retrolisthesis. Skull base and vertebrae: The basion-dental and atlanto-dental intervals are maintained.No evidence of acute fracture to the cervical spine. Soft tissues and spinal canal: No prevertebral fluid or swelling. No visible canal hematoma. Disc levels: Cervical spondylosis with multilevel disc space narrowing, disc bulges/central disc protrusions, uncovertebral hypertrophy and facet arthrosis. Disc space narrowing is greatest at C5-C6 (moderate to advanced at this level). Multilevel spinal canal narrowing. Most notably at C5-C6, a posterior disc osteophyte complex contributes to suspected severe spinal canal stenosis. Multilevel bony neural foraminal narrowing. Upper chest: No consolidation within  the imaged lung apices. No visible pneumothorax. IMPRESSION: CT head: 1. No evidence of an acute intracranial abnormality. 2. Paranasal sinus disease at the imaged levels, as described. 3. Small-volume fluid within the right mastoid air cells. CT cervical spine: 1. No evidence of an acute cervical spine fracture. 2. Dextrocurvature of the cervical spine. 3. 2 mm C2-C3 grade 1 retrolisthesis. 4. Cervical spondylosis as described. At C5-C6, a posterior disc osteophyte complex contributes to suspected severe spinal canal stenosis. Electronically Signed   By: Jackey Loge D.O.   On: 03/01/2023 12:16   CT CERVICAL SPINE WO CONTRAST  Result Date: 03/01/2023 CLINICAL DATA:  Provided history: Neck trauma. Facial trauma, blunt. Additional history provided: Dizziness with subsequent syncopal episode in shower earlier today, COVID positive, lethargy, confusion. EXAM: CT HEAD WITHOUT CONTRAST CT CERVICAL SPINE WITHOUT CONTRAST TECHNIQUE: Multidetector CT imaging of the head and cervical spine was performed following the standard protocol without intravenous contrast. Multiplanar CT image reconstructions of the cervical spine were also generated. RADIATION DOSE REDUCTION: This exam was performed according to the departmental dose-optimization program which includes automated exposure control, adjustment of the mA and/or kV according to patient size and/or use of iterative reconstruction technique. COMPARISON:  Head CT 03/29/2022. Brain MRI 09/13/2009. Cervical spine CT 07/17/2018. FINDINGS: CT HEAD FINDINGS Brain: No age advanced or lobar predominant parenchymal atrophy. There is no acute intracranial hemorrhage. No demarcated cortical infarct. No extra-axial fluid collection. No evidence of an intracranial mass. No midline shift. Vascular: No hyperdense vessel. Skull: No calvarial fracture or aggressive osseous lesion. Sinuses/Orbits: No mass or acute finding within the imaged orbits. Mild mucosal thickening within the  bilateral maxillary sinuses at the imaged levels. Mild-to-moderate mucosal thickening within the bilateral sphenoid sinuses. Mucosal thickening within bilateral ethmoid air cells, overall moderate in severity. Mild mucosal thickening within the bilateral frontal sinuses. Other: Small-volume fluid within the right mastoid air cells. CT CERVICAL SPINE FINDINGS Alignment: Dextrocurvature of the cervical spine. 2 mm C2-C3 grade 1 retrolisthesis. Skull base and vertebrae: The basion-dental and atlanto-dental intervals are maintained.No evidence of acute fracture to the cervical spine. Soft tissues and spinal canal: No prevertebral fluid or swelling. No visible canal hematoma. Disc levels: Cervical spondylosis with multilevel disc space narrowing, disc bulges/central disc protrusions, uncovertebral hypertrophy and facet arthrosis.  Disc space narrowing is greatest at C5-C6 (moderate to advanced at this level). Multilevel spinal canal narrowing. Most notably at C5-C6, a posterior disc osteophyte complex contributes to suspected severe spinal canal stenosis. Multilevel bony neural foraminal narrowing. Upper chest: No consolidation within the imaged lung apices. No visible pneumothorax. IMPRESSION: CT head: 1. No evidence of an acute intracranial abnormality. 2. Paranasal sinus disease at the imaged levels, as described. 3. Small-volume fluid within the right mastoid air cells. CT cervical spine: 1. No evidence of an acute cervical spine fracture. 2. Dextrocurvature of the cervical spine. 3. 2 mm C2-C3 grade 1 retrolisthesis. 4. Cervical spondylosis as described. At C5-C6, a posterior disc osteophyte complex contributes to suspected severe spinal canal stenosis. Electronically Signed   By: Jackey Loge D.O.   On: 03/01/2023 12:16   DG Chest Port 1 View  Result Date: 03/01/2023 CLINICAL DATA:  Shortness of breath and hypoxia EXAM: PORTABLE CHEST 1 VIEW COMPARISON:  08/09/2018 FINDINGS: The heart size and mediastinal  contours are within normal limits. Underinflation. Slight elevation of the right hemidiaphragm. No consolidation, pneumothorax or effusion. No edema. Overlapping cardiac leads. The visualized skeletal structures are unremarkable. IMPRESSION: Underinflation. Elevated right hemidiaphragm. No acute cardiopulmonary disease Electronically Signed   By: Arijana Kays M.D.   On: 03/01/2023 10:25             LOS: 0 days       Sunnie Nielsen, DO [facility] 03/01/2023, 7:16 PM    Dictation software may have been used to generate the above note. Typos may occur and escape review in typed/dictated notes. Please contact Dr Lyn Hollingshead directly for clarity if needed.  Staff may message me via secure chat in Epic  but this may not receive an immediate response,  please page me for urgent matters!  If 7PM-7AM, please contact night coverage [email or web address]

## 2023-03-01 NOTE — ED Provider Notes (Signed)
[place] [facility]ERGENCY DEPARTMENT AT [facility] Provider Note   CSN: [id number] Arrival date & time: 03/01/23  0849     History  Chief Complaint  Patient presents with   Fatigue   Fall    Emily [name] is a 72 y.o. female.  With a past history of vertigo who presents to the ED after a fall.  Patient was recently treated for acute COVID infection with Paxlovid on 02/23/2023.  She lives at home with her husband who called EMS after patient fell at home and was unable to get up on her own power.  EMS noted the patient to be hypoxic and started her on 4 L nasal cannula which improved her hypoxia on pulse oximetry.  The patient herself states she remembers falling but is not sure why she fell.  Denies head trauma loss of consciousness.  No neck pain, chest pain, shortness of breath, abdominal pain or pain in extremities at this time.  She is not on anticoagulation   Fall       Home Medications Prior to Admission medications   Medication Sig Start Date End Date Taking? Authorizing Provider  albuterol (VENTOLIN HFA) 108 (90 Base) MCG/ACT inhaler Inhale 2 puffs into the lungs every 6 (six) hours as needed for wheezing or shortness of breath. 10/04/22   Tollie Eth, NP  ALPRAZolam Prudy Feeler) 1 MG tablet Take 0.5-1 tablets (0.5-1 mg total) by mouth daily as needed. This is a 90-day supply 12/25/22     aspirin EC 81 MG tablet Take 81 mg by mouth daily. Swallow whole.    [provider]  azelastine (OPTIVAR) 0.05 % ophthalmic solution Place 2 drops into both eyes 2 (two) times daily. 09/25/22   Tollie Eth, NP  Blood Pressure Monitor MISC Use as directed 11/23/20   Early, Sung Amabile, NP  buPROPion (WELLBUTRIN XL) 150 MG 24 hr tablet Take 1 tablet (150 mg total) by mouth every morning. 10/30/22     chlorthalidone (HYGROTON) 25 MG tablet Take 1 tablet (25 mg total) by mouth daily. 03/20/22   Tollie Eth, NP  Cholecalciferol (VITAMIN D3) 1.25 MG (50000 UT) capsule Take 1 capsule  (50,000 Units total) by mouth once a week. 10/27/21     cycloSPORINE (RESTASIS) 0.05 % ophthalmic emulsion Instill 1 drop into both eyes twice daily. 08/30/21     dapagliflozin propanediol (FARXIGA) 10 MG TABS tablet Take 1 tablet (10 mg total) by mouth daily. 02/22/22   Tollie Eth, NP  diphenhydrAMINE (BENADRYL) 25 MG tablet Take 25 mg by mouth every 6 (six) hours as needed.    [provider]  fluticasone (FLONASE) 50 MCG/ACT nasal spray Place 1 spray into both nostrils daily. 08/02/22   Omar Person, MD  Fluticasone-Umeclidin-Vilant (TRELEGY ELLIPTA) 100-62.5-25 MCG/ACT AEPB Inhale 1 puff into the lungs daily. 08/02/22   Omar Person, MD  guaiFENesin (MUCINEX) 600 MG 12 hr tablet Take 600 mg by mouth 2 (two) times daily.    [provider]  meclizine (ANTIVERT) 25 MG tablet Take 1 tablet (25 mg total) by mouth 3 (three) times daily as needed for dizziness. 02/05/23   Early, Sung Amabile, NP  Netarsudil-Latanoprost (ROCKLATAN) 0.02-0.005 % SOLN INSTILL 1 DROP INTO EACH EYE AT BEDTIME 06/30/22     ondansetron (ZOFRAN) 4 MG tablet Take 1 tablet (4 mg total) by mouth every 6 (six) to 8 (eight) hours as needed. 11/17/22   Tollie Eth, NP  traZODone (DESYREL) 100  MG tablet Take 3 tablets (300 mg total) by mouth at bedtime. 10/30/22         Allergies    Doxycycline, Penicillins, and Prednisone    Review of Systems   Review of Systems  Psychiatric/Behavioral:  Positive for confusion.   All other systems reviewed and are negative.   Physical Exam Updated Vital Signs BP 111/61 (BP Location: Right Arm)   Pulse 77   Temp 98.5 F (36.9 C) (Oral)   Resp 12   Ht 5\' 6"  (1.676 m)   Wt 113 kg   SpO2 98%   BMI 40.21 kg/m  Physical Exam Vitals and nursing note reviewed.  HENT:     Head: Normocephalic and atraumatic.     Nose: Nose normal.     Mouth/Throat:     Mouth: Mucous membranes are dry.     Comments: Dentition intact Eyes:     Extraocular Movements: Extraocular  movements intact.     Pupils: Pupils are equal, round, and reactive to light.  Cardiovascular:     Rate and Rhythm: Regular rhythm. Tachycardia present.     Pulses: Normal pulses.  Pulmonary:     Effort: Pulmonary effort is normal.     Breath sounds: Normal breath sounds.  Abdominal:     General: There is no distension.     Palpations: Abdomen is soft.     Tenderness: There is no abdominal tenderness. There is no guarding or rebound.  Musculoskeletal:        General: Normal range of motion.     Cervical back: Normal range of motion and neck supple. No tenderness.  Skin:    General: Skin is warm and dry.  Neurological:     General: No focal deficit present.     Mental Status: She is alert.     Cranial Nerves: Cranial nerve deficit present.     Comments: Oriented to self and place only 5 out of 5 motor strength in bilateral upper and lower extremities Full active range of motion with all 4 extremities No focal weakness or pronator drift No facial asymmetry  Psychiatric:        Mood and Affect: Mood normal.     ED Results / Procedures / Treatments   Labs (all labs ordered are listed, but only abnormal results are displayed) Labs Reviewed  SARS CORONAVIRUS 2 BY RT PCR - Abnormal; Notable for the following components:      Result Value   SARS Coronavirus 2 by RT PCR POSITIVE (*)    All other components within normal limits  CBC WITH DIFFERENTIAL/PLATELET - Abnormal; Notable for the following components:   WBC 12.1 (*)    Neutro Abs 10.3 (*)    Abs Immature Granulocytes 0.14 (*)    All other components within normal limits  COMPREHENSIVE METABOLIC PANEL - Abnormal; Notable for the following components:   Glucose, Bld 153 (*)    Calcium 8.8 (*)    Albumin 3.3 (*)    All other components within normal limits  D-DIMER, QUANTITATIVE - Abnormal; Notable for the following components:   D-Dimer, Quant 0.61 (*)    All other components within normal limits  CBG MONITORING, ED -  Abnormal; Notable for the following components:   Glucose-Capillary 152 (*)    All other components within normal limits  I-STAT VENOUS BLOOD GAS, ED - Abnormal; Notable for the following components:   pCO2, Ven 62.4 (*)    Bicarbonate 32.4 (*)    TCO2 34 (*)  Acid-Base Excess 4.0 (*)    All other components within normal limits  URINALYSIS, ROUTINE W REFLEX MICROSCOPIC    EKG None  Radiology CT Angio Chest PE W and/or Wo Contrast  Result Date: 03/01/2023 CLINICAL DATA:  Pt got dizzy while in the shower and then had a syncopal episode. Pt is positive for covid. Also having fatigue EXAM: CT ANGIOGRAPHY CHEST WITH CONTRAST TECHNIQUE: Multidetector CT imaging of the chest was performed using the standard protocol during bolus administration of intravenous contrast. Multiplanar CT image reconstructions and MIPs were obtained to evaluate the vascular anatomy. RADIATION DOSE REDUCTION: This exam was performed according to the departmental dose-optimization program which includes automated exposure control, adjustment of the mA and/or kV according to patient size and/or use of iterative reconstruction technique. CONTRAST:  75mL OMNIPAQUE IOHEXOL 350 MG/ML SOLN COMPARISON:  Chest x-ray 02/28/2023 FINDINGS: Cardiovascular: Satisfactory opacification of the pulmonary arteries to the segmental level. No evidence of pulmonary embolism. Limited evaluation at subsegmental level due to motion artifact. The main pulmonary artery is normal in caliber. Normal heart size. No significant pericardial effusion. The thoracic aorta is normal in caliber. No atherosclerotic plaque of the thoracic aorta. No coronary artery calcifications. Mediastinum/Nodes: No enlarged mediastinal, hilar, or axillary lymph nodes. Thyroid gland, trachea, and esophagus demonstrate no significant findings. Tiny hiatal hernia. Lungs/Pleura: Patchy peribronchovascular bilateral upper lobe airspace opacities. No pulmonary nodule. No pulmonary  mass. No pleural effusion. No pneumothorax. Upper Abdomen: No acute abnormality. Musculoskeletal: No chest wall abnormality. No suspicious lytic or blastic osseous lesions. No acute displaced fracture. Review of the MIP images confirms the above findings. IMPRESSION: 1. No central or segmental pulmonary embolus. Limited evaluation at the subsegmental level due to motion artifact. 2. Bilateral upper lobe peribronchovascular patchy airspace opacity suggestive of infection/inflammation. 3. Tiny hiatal hernia. Electronically Signed   By: Tish Frederickson M.D.   On: 03/01/2023 12:44   CT HEAD WO CONTRAST  Result Date: 03/01/2023 CLINICAL DATA:  Provided history: Neck trauma. Facial trauma, blunt. Additional history provided: Dizziness with subsequent syncopal episode in shower earlier today, COVID positive, lethargy, confusion. EXAM: CT HEAD WITHOUT CONTRAST CT CERVICAL SPINE WITHOUT CONTRAST TECHNIQUE: Multidetector CT imaging of the head and cervical spine was performed following the standard protocol without intravenous contrast. Multiplanar CT image reconstructions of the cervical spine were also generated. RADIATION DOSE REDUCTION: This exam was performed according to the departmental dose-optimization program which includes automated exposure control, adjustment of the mA and/or kV according to patient size and/or use of iterative reconstruction technique. COMPARISON:  Head CT 03/29/2022. Brain MRI 09/13/2009. Cervical spine CT 07/17/2018. FINDINGS: CT HEAD FINDINGS Brain: No age advanced or lobar predominant parenchymal atrophy. There is no acute intracranial hemorrhage. No demarcated cortical infarct. No extra-axial fluid collection. No evidence of an intracranial mass. No midline shift. Vascular: No hyperdense vessel. Skull: No calvarial fracture or aggressive osseous lesion. Sinuses/Orbits: No mass or acute finding within the imaged orbits. Mild mucosal thickening within the bilateral maxillary sinuses at  the imaged levels. Mild-to-moderate mucosal thickening within the bilateral sphenoid sinuses. Mucosal thickening within bilateral ethmoid air cells, overall moderate in severity. Mild mucosal thickening within the bilateral frontal sinuses. Other: Small-volume fluid within the right mastoid air cells. CT CERVICAL SPINE FINDINGS Alignment: Dextrocurvature of the cervical spine. 2 mm C2-C3 grade 1 retrolisthesis. Skull base and vertebrae: The basion-dental and atlanto-dental intervals are maintained.No evidence of acute fracture to the cervical spine. Soft tissues and spinal canal: No prevertebral fluid or swelling. No visible  canal hematoma. Disc levels: Cervical spondylosis with multilevel disc space narrowing, disc bulges/central disc protrusions, uncovertebral hypertrophy and facet arthrosis. Disc space narrowing is greatest at C5-C6 (moderate to advanced at this level). Multilevel spinal canal narrowing. Most notably at C5-C6, a posterior disc osteophyte complex contributes to suspected severe spinal canal stenosis. Multilevel bony neural foraminal narrowing. Upper chest: No consolidation within the imaged lung apices. No visible pneumothorax. IMPRESSION: CT head: 1. No evidence of an acute intracranial abnormality. 2. Paranasal sinus disease at the imaged levels, as described. 3. Small-volume fluid within the right mastoid air cells. CT cervical spine: 1. No evidence of an acute cervical spine fracture. 2. Dextrocurvature of the cervical spine. 3. 2 mm C2-C3 grade 1 retrolisthesis. 4. Cervical spondylosis as described. At C5-C6, a posterior disc osteophyte complex contributes to suspected severe spinal canal stenosis. Electronically Signed   By: Jackey Loge D.O.   On: 03/01/2023 12:16   CT CERVICAL SPINE WO CONTRAST  Result Date: 03/01/2023 CLINICAL DATA:  Provided history: Neck trauma. Facial trauma, blunt. Additional history provided: Dizziness with subsequent syncopal episode in shower earlier today,  COVID positive, lethargy, confusion. EXAM: CT HEAD WITHOUT CONTRAST CT CERVICAL SPINE WITHOUT CONTRAST TECHNIQUE: Multidetector CT imaging of the head and cervical spine was performed following the standard protocol without intravenous contrast. Multiplanar CT image reconstructions of the cervical spine were also generated. RADIATION DOSE REDUCTION: This exam was performed according to the departmental dose-optimization program which includes automated exposure control, adjustment of the mA and/or kV according to patient size and/or use of iterative reconstruction technique. COMPARISON:  Head CT 03/29/2022. Brain MRI 09/13/2009. Cervical spine CT 07/17/2018. FINDINGS: CT HEAD FINDINGS Brain: No age advanced or lobar predominant parenchymal atrophy. There is no acute intracranial hemorrhage. No demarcated cortical infarct. No extra-axial fluid collection. No evidence of an intracranial mass. No midline shift. Vascular: No hyperdense vessel. Skull: No calvarial fracture or aggressive osseous lesion. Sinuses/Orbits: No mass or acute finding within the imaged orbits. Mild mucosal thickening within the bilateral maxillary sinuses at the imaged levels. Mild-to-moderate mucosal thickening within the bilateral sphenoid sinuses. Mucosal thickening within bilateral ethmoid air cells, overall moderate in severity. Mild mucosal thickening within the bilateral frontal sinuses. Other: Small-volume fluid within the right mastoid air cells. CT CERVICAL SPINE FINDINGS Alignment: Dextrocurvature of the cervical spine. 2 mm C2-C3 grade 1 retrolisthesis. Skull base and vertebrae: The basion-dental and atlanto-dental intervals are maintained.No evidence of acute fracture to the cervical spine. Soft tissues and spinal canal: No prevertebral fluid or swelling. No visible canal hematoma. Disc levels: Cervical spondylosis with multilevel disc space narrowing, disc bulges/central disc protrusions, uncovertebral hypertrophy and facet  arthrosis. Disc space narrowing is greatest at C5-C6 (moderate to advanced at this level). Multilevel spinal canal narrowing. Most notably at C5-C6, a posterior disc osteophyte complex contributes to suspected severe spinal canal stenosis. Multilevel bony neural foraminal narrowing. Upper chest: No consolidation within the imaged lung apices. No visible pneumothorax. IMPRESSION: CT head: 1. No evidence of an acute intracranial abnormality. 2. Paranasal sinus disease at the imaged levels, as described. 3. Small-volume fluid within the right mastoid air cells. CT cervical spine: 1. No evidence of an acute cervical spine fracture. 2. Dextrocurvature of the cervical spine. 3. 2 mm C2-C3 grade 1 retrolisthesis. 4. Cervical spondylosis as described. At C5-C6, a posterior disc osteophyte complex contributes to suspected severe spinal canal stenosis. Electronically Signed   By: Jackey Loge D.O.   On: 03/01/2023 12:16   DG Chest Port 1  View  Result Date: 03/01/2023 CLINICAL DATA:  Shortness of breath and hypoxia EXAM: PORTABLE CHEST 1 VIEW COMPARISON:  08/09/2018 FINDINGS: The heart size and mediastinal contours are within normal limits. Underinflation. Slight elevation of the right hemidiaphragm. No consolidation, pneumothorax or effusion. No edema. Overlapping cardiac leads. The visualized skeletal structures are unremarkable. IMPRESSION: Underinflation. Elevated right hemidiaphragm. No acute cardiopulmonary disease Electronically Signed   By: Maigen Kays M.D.   On: 03/01/2023 10:25    Procedures Procedures    Medications Ordered in ED Medications  iohexol (OMNIPAQUE) 350 MG/ML injection 75 mL (75 mLs Intravenous Contrast Given 03/01/23 1135)  acetaminophen (TYLENOL) tablet 650 mg (650 mg Oral Given 03/01/23 1241)    ED Course/ Medical Decision Making/ A&P Clinical Course as of 03/01/23 1405  Thu Mar 01, 2023  1404 Laboratory workup reveals leukocytosis 12.1 D-dimer elevated at 0.61.  Labs otherwise  unremarkable.  No significant electrolyte imbalance or renal impairment.  CTA of the chest shows no evidence of pulmonary embolism.  No traumatic findings on CT head and CT C-spine without contrast.  Chest x-ray shows no acute disease.  Patient has had intermittent episodes of hypoxia here in the ED down to low 90s.  She is still altered.  This may be in the setting of acute COVID.  Low suspicion for bacterial pneumonia at this time.  Plan to admit to medicine for acute hypoxemia and altered mental status in the setting of acute COVID [MP]    Clinical Course User Index [MP] Royanne Foots, DO                                 Medical Decision Making 72 year old female presents to the ED for concern of fall at home differential diagnosis includes syncope, mechanical fall, vertigo, orthostatic hypotension and dehydration.  Recently treated for acute COVID with Paxlovid.  Was reportedly hypoxic for EMS which improved after 4 L nasal cannula.  No O2 at baseline.  Upon my ischial assessment patient is normotensive, slightly tachycardic, afebrile with intermittent episodes of hypoxia on room air to the low 90s.  She was placed on 2 L O2 nasal cannula with improvement in her hypoxia.  She is not tachypneic.  Exam notable for global confusion with slow response to questioning.  Oriented to self and place only.  No evidence of acute traumatic injury.  No focal neurologic deficit that would be concerning for CVA/TIA.  Given reported fall will obtain CT head without contrast as well as CT C-spine without contrast to evaluate for traumatic injury of the head and neck.  With reported hypoxia will obtain chest x-ray which may be in the setting of acute COVID versus post-COVID pneumonia.  She is afebrile and not meeting SIRS criteria but would have low threshold to obtain septic workup should her clinical appearance for vital signs indicate.  Will obtain urinalysis to look for UTI as potential etiology of her confusion.   Will also obtain EKG to look for any evidence of dysrhythmia that might explain syncopal episode.  Will obtain laboratory workup to evaluate for leukocytosis, anemia or significant electrolyte imbalance the source of her confusion and fall    Additional history obtained:  Additional history obtained from EMR including recent PCP office visit earlier this week          Amount and/or Complexity of Data Reviewed Labs: ordered. Radiology: ordered. ECG/medicine tests: ordered.  Risk OTC drugs. Prescription  drug management. Decision regarding hospitalization.           Final Clinical Impression(s) / ED Diagnoses Final diagnoses:  Altered mental status, unspecified altered mental status type  COVID  Hypoxemia    Rx / DC Orders ED Discharge Orders     None         Royanne Foots, DO 03/01/23 1407

## 2023-03-01 NOTE — ED Triage Notes (Signed)
Pt presents via EMS from home after falling this morning and was unable to get back up on her own. Per EMS, no headstrike, LOC, or thinners. Per EMS, patient possibly had a syncopal episode trying to get off of the toilet. Per EMS, patient has had covid symptoms (lethargy, confusion) since Wednesday. Per EMS, patient was satting at 91% on room air; improved to 96% on 4L [place].  EMS BG: 196 Patient SpO2 93% on room air upon arrival. Patient AAOx3 (disoriented to month). Patient reports that she remembers falling and that she felt dizzy and then passed out. Patient denies any injuries or pain upon arrival.

## 2023-03-01 NOTE — ED Notes (Signed)
ED TO INPATIENT HANDOFF REPORT  ED Nurse Name and Phone #: Dahlia Client [phone]  S Name/Age/Gender Emily [name] 72 y.o. female Room/Bed: 006C/006C  Code Status   Code Status: Limited: Do not attempt resuscitation (DNR) -DNR-LIMITED -Do Not Intubate/DNI   Home/SNF/Other Home Patient oriented to: self, place, and situation Is this baseline? No   Triage Complete: Triage complete  Chief Complaint Syncope [R55]  Triage Note Pt presents via EMS from home after falling this morning and was unable to get back up on her own. Per EMS, no headstrike, LOC, or thinners. Per EMS, patient possibly had a syncopal episode trying to get off of the toilet. Per EMS, patient has had covid symptoms (lethargy, confusion) since Wednesday. Per EMS, patient was satting at 91% on room air; improved to 96% on 4L [place].  EMS BG: 196 Patient SpO2 93% on room air upon arrival. Patient AAOx3 (disoriented to month). Patient reports that she remembers falling and that she felt dizzy and then passed out. Patient denies any injuries or pain upon arrival.    Allergies Allergies  Allergen Reactions   Doxycycline     Gi upset   Penicillins     REACTION: rash, aggression Has patient had a PCN reaction causing immediate rash, facial/tongue/throat swelling, SOB or lightheadedness with hypotension: YES Has patient had a PCN reaction causing severe rash involving mucus membranes or skin necrosis: NO Has patient had a PCN reaction that required hospitalizationNO Has patient had a PCN reaction occurring within the last 10 years:NO If all of the above answers are "NO", then may proceed with Cephalosporin use.    Prednisone     REACTION: agressive behavior    Level of Care/Admitting Diagnosis ED Disposition     ED Disposition  Admit   Condition  --   Comment  Hospital Area: [facility] [[id number]]  Level of Care: Telemetry Medical [104]  May place patient in observation at [facility] or [facility]  if equivalent level of care is available:: Yes  Covid Evaluation: Confirmed COVID Positive  Diagnosis: Syncope [206001]  Admitting Physician: Sunnie Nielsen [[id number]]  Attending Physician: Sunnie Nielsen [[id number]]          B Medical/Surgery History Past Medical History:  Diagnosis Date   Acute bacterial sinusitis 06/29/2021   Acute non-recurrent ethmoidal sinusitis 08/05/2021   Acute non-recurrent pansinusitis 01/25/2022   Allergy    seasonal    Anxiety    ARTHRALGIA 11/10/2009   Qualifier: Diagnosis of  By: Caryl Never MD, Bruce     Cancer [facility] 2009   squamous cell skin   Cataract    bilateral surgery, left unsuccessful   Cough 11/23/2020   Cystitis 02/12/2020   Depression    DYSPNEA 11/10/2009   Qualifier: Diagnosis of  By: Caryl Never MD, Bruce     Eating disorder    GERD (gastroesophageal reflux disease)    Glaucoma    Head injury 07/23/2018   Hiatal hernia    History of cardiovascular stress test 04/2000   cardiolyte   History of flexible sigmoidoscopy 09/21/1999   Influenza A 03/13/2022   Other fatigue 02/26/2020   Otitis of both ears 03/08/2020   Sinobronchitis 04/05/2021   WEIGHT GAIN 11/10/2009   Qualifier: Diagnosis of  By: Caryl Never MD, Bruce     Past Surgical History:  Procedure Laterality Date   ABDOMINAL HYSTERECTOMY     APPENDECTOMY     CARDIAC CATHETERIZATION  04/2000   CARDIAC CATHETERIZATION     COLONOSCOPY  12/31/09   UPPER GASTROINTESTINAL ENDOSCOPY  09/15/1999   WISDOM TOOTH EXTRACTION       A IV Location/Drains/Wounds Patient Lines/Drains/Airways Status     Active Line/Drains/Airways     Name Placement date Placement time Site Days   Peripheral IV 03/01/23 20 G Left;Posterior Hand 03/01/23  --  Hand  less than 1   Peripheral IV 03/01/23 18 G Left Antecubital 03/01/23  0921  Antecubital  less than 1            Intake/Output Last 24 hours  Intake/Output Summary (Last 24 hours) at 03/01/2023 1507 Last data filed at  03/01/2023 0854 Gross per 24 hour  Intake 75 ml  Output --  Net 75 ml    Labs/Imaging Results for orders placed or performed during the hospital encounter of 03/01/23 (from the past 48 hour(s))  SARS Coronavirus 2 by RT PCR (hospital order, performed in [facility] hospital lab) *cepheid single result test* Anterior Nasal Swab     Status: Abnormal   Collection Time: 03/01/23  9:02 AM   Specimen: Anterior Nasal Swab  Result Value Ref Range   SARS Coronavirus 2 by RT PCR POSITIVE (A) NEGATIVE    Comment: Performed at [facility], [address] [place], [place] [address]  CBG monitoring, ED     Status: Abnormal   Collection Time: 03/01/23  9:22 AM  Result Value Ref Range   Glucose-Capillary 152 (H) 70 - 99 mg/dL    Comment: Glucose reference range applies only to samples taken after fasting for at least 8 hours.  CBC WITH DIFFERENTIAL     Status: Abnormal   Collection Time: 03/01/23  9:25 AM  Result Value Ref Range   WBC 12.1 (H) 4.0 - 10.5 K/uL   RBC 4.76 3.87 - 5.11 MIL/uL   Hemoglobin 14.3 12.0 - 15.0 g/dL   HCT 60.4 54.0 - 98.1 %   MCV 93.7 80.0 - 100.0 fL   MCH 30.0 26.0 - 34.0 pg   MCHC 32.1 30.0 - 36.0 g/dL   RDW 19.1 47.8 - 29.5 %   Platelets 268 150 - 400 K/uL   nRBC 0.0 0.0 - 0.2 %   Neutrophils Relative % 86 %   Neutro Abs 10.3 (H) 1.7 - 7.7 K/uL   Lymphocytes Relative 6 %   Lymphs Abs 0.8 0.7 - 4.0 K/uL   Monocytes Relative 6 %   Monocytes Absolute 0.8 0.1 - 1.0 K/uL   Eosinophils Relative 1 %   Eosinophils Absolute 0.1 0.0 - 0.5 K/uL   Basophils Relative 0 %   Basophils Absolute 0.0 0.0 - 0.1 K/uL   Immature Granulocytes 1 %   Abs Immature Granulocytes 0.14 (H) 0.00 - 0.07 K/uL    Comment: Performed at [facility], [address] [place], [place] [address]  Comprehensive metabolic panel     Status: Abnormal   Collection Time: 03/01/23  9:25 AM  Result Value Ref Range   Sodium 136 135 - 145 mmol/L   Potassium 4.6 3.5 - 5.1 mmol/L   Chloride  98 98 - 111 mmol/L   CO2 29 22 - 32 mmol/L   Glucose, Bld 153 (H) 70 - 99 mg/dL    Comment: Glucose reference range applies only to samples taken after fasting for at least 8 hours.   BUN 12 8 - 23 mg/dL   Creatinine, Ser 8.65 0.44 - 1.00 mg/dL   Calcium 8.8 (L) 8.9 - 10.3 mg/dL   Total  Protein 6.5 6.5 - 8.1 g/dL   Albumin 3.3 (L) 3.5 - 5.0 g/dL   AST 19 15 - 41 U/L   ALT 22 0 - 44 U/L   Alkaline Phosphatase 49 38 - 126 U/L   Total Bilirubin 0.5 0.3 - 1.2 mg/dL   GFR, Estimated >40 >98 mL/min    Comment: (NOTE) Calculated using the CKD-EPI Creatinine Equation (2021)    Anion gap 9 5 - 15    Comment: Performed at [facility], [address], [place], [place] [address]  D-dimer, quantitative     Status: Abnormal   Collection Time: 03/01/23  9:25 AM  Result Value Ref Range   D-Dimer, Quant 0.61 (H) 0.00 - 0.50 ug/mL-FEU    Comment: (NOTE) At the manufacturer cut-off value of 0.5 g/mL FEU, this assay has a negative predictive value of 95-100%.This assay is intended for use in conjunction with a clinical pretest probability (PTP) assessment model to exclude pulmonary embolism (PE) and deep venous thrombosis (DVT) in outpatients suspected of PE or DVT. Results should be correlated with clinical presentation. Performed at [facility], [address] [place], [place] [address]   I-Stat venous blood gas, [facility], [facility], [facility])     Status: Abnormal   Collection Time: 03/01/23  9:37 AM  Result Value Ref Range   pH, Ven 7.323 7.25 - 7.43   pCO2, Ven 62.4 (H) 44 - 60 mmHg   pO2, Ven 41 32 - 45 mmHg   Bicarbonate 32.4 (H) 20.0 - 28.0 mmol/L   TCO2 34 (H) 22 - 32 mmol/L   O2 Saturation 71 %   Acid-Base Excess 4.0 (H) 0.0 - 2.0 mmol/L   Sodium 138 135 - 145 mmol/L   Potassium 4.6 3.5 - 5.1 mmol/L   Calcium, Ion 1.17 1.15 - 1.40 mmol/L   HCT 43.0 36.0 - 46.0 %   Hemoglobin 14.6 12.0 - 15.0 g/dL   Sample type VENOUS   Urinalysis, Routine w reflex microscopic -Urine,  Catheterized     Status: None   Collection Time: 03/01/23  9:48 AM  Result Value Ref Range   Color, Urine YELLOW YELLOW   APPearance CLEAR CLEAR   Specific Gravity, Urine 1.018 1.005 - 1.030   pH 5.0 5.0 - 8.0   Glucose, UA NEGATIVE NEGATIVE mg/dL   Hgb urine dipstick NEGATIVE NEGATIVE   Bilirubin Urine NEGATIVE NEGATIVE   Ketones, ur NEGATIVE NEGATIVE mg/dL   Protein, ur NEGATIVE NEGATIVE mg/dL   Nitrite NEGATIVE NEGATIVE   Leukocytes,Ua NEGATIVE NEGATIVE    Comment: Performed at [facility], [address] [place], [place] [address]   CT Angio Chest PE W and/or Wo Contrast  Result Date: 03/01/2023 CLINICAL DATA:  Pt got dizzy while in the shower and then had a syncopal episode. Pt is positive for covid. Also having fatigue EXAM: CT ANGIOGRAPHY CHEST WITH CONTRAST TECHNIQUE: Multidetector CT imaging of the chest was performed using the standard protocol during bolus administration of intravenous contrast. Multiplanar CT image reconstructions and MIPs were obtained to evaluate the vascular anatomy. RADIATION DOSE REDUCTION: This exam was performed according to the departmental dose-optimization program which includes automated exposure control, adjustment of the mA and/or kV according to patient size and/or use of iterative reconstruction technique. CONTRAST:  75mL OMNIPAQUE IOHEXOL 350 MG/ML SOLN COMPARISON:  Chest x-ray 02/28/2023 FINDINGS: Cardiovascular: Satisfactory opacification of the pulmonary arteries to the segmental level. No evidence of pulmonary embolism. Limited evaluation at subsegmental level due  to motion artifact. The main pulmonary artery is normal in caliber. Normal heart size. No significant pericardial effusion. The thoracic aorta is normal in caliber. No atherosclerotic plaque of the thoracic aorta. No coronary artery calcifications. Mediastinum/Nodes: No enlarged mediastinal, hilar, or axillary lymph nodes. Thyroid gland, trachea, and esophagus demonstrate no  significant findings. Tiny hiatal hernia. Lungs/Pleura: Patchy peribronchovascular bilateral upper lobe airspace opacities. No pulmonary nodule. No pulmonary mass. No pleural effusion. No pneumothorax. Upper Abdomen: No acute abnormality. Musculoskeletal: No chest wall abnormality. No suspicious lytic or blastic osseous lesions. No acute displaced fracture. Review of the MIP images confirms the above findings. IMPRESSION: 1. No central or segmental pulmonary embolus. Limited evaluation at the subsegmental level due to motion artifact. 2. Bilateral upper lobe peribronchovascular patchy airspace opacity suggestive of infection/inflammation. 3. Tiny hiatal hernia. Electronically Signed   By: Tish Frederickson M.D.   On: 03/01/2023 12:44   CT HEAD WO CONTRAST  Result Date: 03/01/2023 CLINICAL DATA:  Provided history: Neck trauma. Facial trauma, blunt. Additional history provided: Dizziness with subsequent syncopal episode in shower earlier today, COVID positive, lethargy, confusion. EXAM: CT HEAD WITHOUT CONTRAST CT CERVICAL SPINE WITHOUT CONTRAST TECHNIQUE: Multidetector CT imaging of the head and cervical spine was performed following the standard protocol without intravenous contrast. Multiplanar CT image reconstructions of the cervical spine were also generated. RADIATION DOSE REDUCTION: This exam was performed according to the departmental dose-optimization program which includes automated exposure control, adjustment of the mA and/or kV according to patient size and/or use of iterative reconstruction technique. COMPARISON:  Head CT 03/29/2022. Brain MRI 09/13/2009. Cervical spine CT 07/17/2018. FINDINGS: CT HEAD FINDINGS Brain: No age advanced or lobar predominant parenchymal atrophy. There is no acute intracranial hemorrhage. No demarcated cortical infarct. No extra-axial fluid collection. No evidence of an intracranial mass. No midline shift. Vascular: No hyperdense vessel. Skull: No calvarial fracture or  aggressive osseous lesion. Sinuses/Orbits: No mass or acute finding within the imaged orbits. Mild mucosal thickening within the bilateral maxillary sinuses at the imaged levels. Mild-to-moderate mucosal thickening within the bilateral sphenoid sinuses. Mucosal thickening within bilateral ethmoid air cells, overall moderate in severity. Mild mucosal thickening within the bilateral frontal sinuses. Other: Small-volume fluid within the right mastoid air cells. CT CERVICAL SPINE FINDINGS Alignment: Dextrocurvature of the cervical spine. 2 mm C2-C3 grade 1 retrolisthesis. Skull base and vertebrae: The basion-dental and atlanto-dental intervals are maintained.No evidence of acute fracture to the cervical spine. Soft tissues and spinal canal: No prevertebral fluid or swelling. No visible canal hematoma. Disc levels: Cervical spondylosis with multilevel disc space narrowing, disc bulges/central disc protrusions, uncovertebral hypertrophy and facet arthrosis. Disc space narrowing is greatest at C5-C6 (moderate to advanced at this level). Multilevel spinal canal narrowing. Most notably at C5-C6, a posterior disc osteophyte complex contributes to suspected severe spinal canal stenosis. Multilevel bony neural foraminal narrowing. Upper chest: No consolidation within the imaged lung apices. No visible pneumothorax. IMPRESSION: CT head: 1. No evidence of an acute intracranial abnormality. 2. Paranasal sinus disease at the imaged levels, as described. 3. Small-volume fluid within the right mastoid air cells. CT cervical spine: 1. No evidence of an acute cervical spine fracture. 2. Dextrocurvature of the cervical spine. 3. 2 mm C2-C3 grade 1 retrolisthesis. 4. Cervical spondylosis as described. At C5-C6, a posterior disc osteophyte complex contributes to suspected severe spinal canal stenosis. Electronically Signed   By: Jackey Loge D.O.   On: 03/01/2023 12:16   CT CERVICAL SPINE WO CONTRAST  Result Date: 03/01/2023 CLINICAL  DATA:  Provided history: Neck trauma. Facial trauma, blunt. Additional history provided: Dizziness with subsequent syncopal episode in shower earlier today, COVID positive, lethargy, confusion. EXAM: CT HEAD WITHOUT CONTRAST CT CERVICAL SPINE WITHOUT CONTRAST TECHNIQUE: Multidetector CT imaging of the head and cervical spine was performed following the standard protocol without intravenous contrast. Multiplanar CT image reconstructions of the cervical spine were also generated. RADIATION DOSE REDUCTION: This exam was performed according to the departmental dose-optimization program which includes automated exposure control, adjustment of the mA and/or kV according to patient size and/or use of iterative reconstruction technique. COMPARISON:  Head CT 03/29/2022. Brain MRI 09/13/2009. Cervical spine CT 07/17/2018. FINDINGS: CT HEAD FINDINGS Brain: No age advanced or lobar predominant parenchymal atrophy. There is no acute intracranial hemorrhage. No demarcated cortical infarct. No extra-axial fluid collection. No evidence of an intracranial mass. No midline shift. Vascular: No hyperdense vessel. Skull: No calvarial fracture or aggressive osseous lesion. Sinuses/Orbits: No mass or acute finding within the imaged orbits. Mild mucosal thickening within the bilateral maxillary sinuses at the imaged levels. Mild-to-moderate mucosal thickening within the bilateral sphenoid sinuses. Mucosal thickening within bilateral ethmoid air cells, overall moderate in severity. Mild mucosal thickening within the bilateral frontal sinuses. Other: Small-volume fluid within the right mastoid air cells. CT CERVICAL SPINE FINDINGS Alignment: Dextrocurvature of the cervical spine. 2 mm C2-C3 grade 1 retrolisthesis. Skull base and vertebrae: The basion-dental and atlanto-dental intervals are maintained.No evidence of acute fracture to the cervical spine. Soft tissues and spinal canal: No prevertebral fluid or swelling. No visible canal  hematoma. Disc levels: Cervical spondylosis with multilevel disc space narrowing, disc bulges/central disc protrusions, uncovertebral hypertrophy and facet arthrosis. Disc space narrowing is greatest at C5-C6 (moderate to advanced at this level). Multilevel spinal canal narrowing. Most notably at C5-C6, a posterior disc osteophyte complex contributes to suspected severe spinal canal stenosis. Multilevel bony neural foraminal narrowing. Upper chest: No consolidation within the imaged lung apices. No visible pneumothorax. IMPRESSION: CT head: 1. No evidence of an acute intracranial abnormality. 2. Paranasal sinus disease at the imaged levels, as described. 3. Small-volume fluid within the right mastoid air cells. CT cervical spine: 1. No evidence of an acute cervical spine fracture. 2. Dextrocurvature of the cervical spine. 3. 2 mm C2-C3 grade 1 retrolisthesis. 4. Cervical spondylosis as described. At C5-C6, a posterior disc osteophyte complex contributes to suspected severe spinal canal stenosis. Electronically Signed   By: Jackey Loge D.O.   On: 03/01/2023 12:16   DG Chest Port 1 View  Result Date: 03/01/2023 CLINICAL DATA:  Shortness of breath and hypoxia EXAM: PORTABLE CHEST 1 VIEW COMPARISON:  08/09/2018 FINDINGS: The heart size and mediastinal contours are within normal limits. Underinflation. Slight elevation of the right hemidiaphragm. No consolidation, pneumothorax or effusion. No edema. Overlapping cardiac leads. The visualized skeletal structures are unremarkable. IMPRESSION: Underinflation. Elevated right hemidiaphragm. No acute cardiopulmonary disease Electronically Signed   By: Tierra Kays M.D.   On: 03/01/2023 10:25    Pending Labs Unresulted Labs (From admission, onward)     Start     Ordered   03/08/23 0500  Creatinine, serum  (enoxaparin (LOVENOX)    CrCl >/= 30 ml/min)  Weekly,   R     Comments: while on enoxaparin therapy    03/01/23 1504   03/01/23 1501  Creatinine, serum   (enoxaparin (LOVENOX)    CrCl >/= 30 ml/min)  Once,   R       Comments: Baseline for enoxaparin therapy IF NOT ALREADY  DRAWN.    03/01/23 1504            Vitals/Pain Today's Vitals   03/01/23 0930 03/01/23 1000 03/01/23 1328 03/01/23 1400  BP: (!) 114/93 115/80 111/61   Pulse: (!) 104 98 83 77  Resp: 19 20 17 12   Temp:   98.5 F (36.9 C)   TempSrc:   Oral   SpO2: 98% 98% 94% 98%  Weight:      Height:      PainSc:        Isolation Precautions No active isolations  Medications Medications  sodium chloride flush (NS) 0.9 % injection 3 mL (has no administration in time range)  enoxaparin (LOVENOX) injection 40 mg (has no administration in time range)  0.9 %  sodium chloride infusion (has no administration in time range)  acetaminophen (TYLENOL) tablet 650 mg (has no administration in time range)    Or  acetaminophen (TYLENOL) suppository 650 mg (has no administration in time range)  oxyCODONE (Oxy IR/ROXICODONE) immediate release tablet 5 mg (has no administration in time range)  polyethylene glycol (MIRALAX / GLYCOLAX) packet 17 g (has no administration in time range)  ondansetron (ZOFRAN) tablet 4 mg (has no administration in time range)    Or  ondansetron (ZOFRAN) injection 4 mg (has no administration in time range)  iohexol (OMNIPAQUE) 350 MG/ML injection 75 mL (75 mLs Intravenous Contrast Given 03/01/23 1135)  acetaminophen (TYLENOL) tablet 650 mg (650 mg Oral Given 03/01/23 1241)    Mobility walks with device     Focused Assessments Cardiac Assessment Handoff:  Cardiac Rhythm: Normal sinus rhythm No results found for: "CKTOTAL", "CKMB", "CKMBINDEX", "TROPONINI" Lab Results  Component Value Date   DDIMER 0.61 (H) 03/01/2023   Does the Patient currently have chest pain? No   , Neuro Assessment Handoff:  Swallow screen pass? Yes  Cardiac Rhythm: Normal sinus rhythm      , Pulmonary Assessment Handoff:  Lung sounds:   O2 Device: Nasal Cannula O2  Flow Rate (L/min): 2 L/min    R Recommendations: See Admitting Provider Note  Report given to:   Additional Notes:

## 2023-03-01 NOTE — ED Notes (Signed)
Per ED attending physician, Dr. Elayne Snare, patient placed on 2L  at this time. Patient SpO2 94% at time of intervention.

## 2023-03-01 NOTE — ED Notes (Signed)
ED Provider at bedside. 

## 2023-03-01 NOTE — Telephone Encounter (Signed)
I was speaking with Mr. Gergely about his lab work and he wanted me to let you know that Emily [name] fell in the shower last night and had to go to the hospital. They said her oxygen was low and she is still testing positive for covid.

## 2023-03-01 NOTE — Hospital Course (Addendum)
Emily [name] is a 72 y.o. female w/ PMH vertigo on meclizine prn, anxiety/depression on home Xanax Wellbutrin Trazodone, history asthma/COPD on home albuterol prn and Trelegy, sinus/allergies on Flonase, HTN on chlorthalidone, DM2 on home farxiga, takes ASA.   Pt presents via EMS from home after falling this morning and was unable to get back up on her own. (+)COVID d/ctx by PCP via virtual visit (her husband was positive for COVID), Paxlovid starting 09/06. Her symptoms started around 09/03 w/ cough, congestion, sinus drainage/pressure, chest congestions, chills, fever, moderate cough. She did not tolerate Paxlovid and did not complete course d/t GI side effects. Also given Rx for Hycodan, albuterol,  also advised for OTC antihistamine, acetaminophen.   Presents to ED 03/01/23 via EMS d/t syncope/fall, AMS. Per EMS, patient was satting at 91% on room air; improved to 96% on 4L [place]. In ED, 93% on RA improved on 2L. In ED, barelly meeting sepsis criteria w/ WBC 12.1, Hr 111-104. BP 130s/70s, temp not quite febrile but up at 99.5F. Other labs non-concerning including CMP, CBC, UA. EKG sinus tach. D-Dimer (+), PE r/o on CTA chest. CTA chest notes upper lobe patchiness concerning for infection/inflammation. Given fall, obtained CT head which was non-concerning, CT C-spine (+)chornic spondylosis and severe spinal canal stenosis. On exam, notable confusion w/ slow response to questioning, oriented to self/place. In ED, tx w/ tylenol and w/u as above.     Consultants:  none  Procedures: none      ASSESSMENT & PLAN:   Principal Problem:   Syncope Active Problems:   Depression, recurrent (HCC)   Hepatic steatosis   Hypertension   Brain fog   Generalized anxiety disorder   Vertigo, intermittent   COVID-19 virus infection   SIRS (systemic inflammatory response syndrome) vs sepsis d/t COVID   Sinusitis   Degenerative cervical spinal stenosis   Syncope SIRS / question sepsis Likely  vasovagal/orthostatic d/t COVID related weakness History of vertigo  Recent Echo, EF 55-60 and G1DD 12/2022, will not repeat Carotid US  Telemetry Orthostatic VS Fluid hydration   Sinusitis Recurrent Clindamycin d/t allergy to PCN and doxy  Continue home Flonase  Acute Hypoxic respiratory failure d/t COVID vs COPD  Supplemental O2 Treat underlying causes  Supportive care: guaifenesin   Asthma/COPD DuoNeb prn  O2  Steroids given low O2 and COVID  COVID Likely contributing to syncope/weakness and brain fog  Steroids given low O2 and concern possible COPD Will not given antivirals, outside window   HTN Holding home chlorthalidone and farxiga for now   Glaucoma Conitnue home eye drops       Obesity based on BMI: Body mass index is 40.21 kg/m.   DVT prophylaxis: lovenox  IV fluids: NS 1L continuous IV fluids  for now  Nutrition: regular diet  Central lines / invasive devices: none  Code Status: DNR d/w patient and her husband at bedside on admission  ACP documentation reviewed: none on file in [facility]  TOC needs: none at this time, may need PT/OT eval HH? Barriers to dispo / significant pending items: hopeful for discharge tomorrow if clinically improved and no events on tele / no concerns on labs / O2 improving / carotid US reassuring

## 2023-03-01 NOTE — Care Management (Signed)
Transition of Care [facility]) - Inpatient Brief Assessment   Patient Details  Name: Emily [name] MRN: [id number] Date of Birth: 1951/02/02  Transition of Care [facility] CM/SW Contact:    Lockie Pares, RN Phone Number: 03/01/2023, 8:12 PM   Clinical Narrative: Patient presents for fall, did not hit head no LOC loss. Was confused to month. COVID +, had received paxlovid history of COPD, on 2LPM, not on oxygen at home.  Will watch for new oxygen need, possible DC tomorrow   Transition of Care Asessment: Insurance and Status: Insurance coverage has been reviewed Patient has primary care physician: Yes Home environment has been reviewed: Yes Prior level of function:: Independent with assistance Prior/Current Home Services: No current home services Social Determinants of Health Reivew: SDOH reviewed no interventions necessary Readmission risk has been reviewed: Yes Transition of care needs: no transition of care needs at this time

## 2023-03-02 ENCOUNTER — Observation Stay (HOSPITAL_BASED_OUTPATIENT_CLINIC_OR_DEPARTMENT_OTHER): Payer: PPO

## 2023-03-02 DIAGNOSIS — R55 Syncope and collapse: Secondary | ICD-10-CM

## 2023-03-02 LAB — PROCALCITONIN: Procalcitonin: <0.1 ng/mL

## 2023-03-02 LAB — CBC WITH DIFFERENTIAL/PLATELET
Abs Immature Granulocytes: 0.05 10*3/uL (ref 0.00–0.07)
Basophils Absolute: 0 10*3/uL (ref 0.0–0.1)
Basophils Relative: 0 %
Eosinophils Absolute: 0.2 10*3/uL (ref 0.0–0.5)
Eosinophils Relative: 2 %
HCT: 38.5 % (ref 36.0–46.0)
Hemoglobin: 12.6 g/dL (ref 12.0–15.0)
Immature Granulocytes: 1 %
Lymphocytes Relative: 18 %
Lymphs Abs: 1.3 10*3/uL (ref 0.7–4.0)
MCH: 30 pg (ref 26.0–34.0)
MCHC: 32.7 g/dL (ref 30.0–36.0)
MCV: 91.7 fL (ref 80.0–100.0)
Monocytes Absolute: 0.6 10*3/uL (ref 0.1–1.0)
Monocytes Relative: 9 %
Neutro Abs: 5.2 10*3/uL (ref 1.7–7.7)
Neutrophils Relative %: 70 %
Platelets: 253 10*3/uL (ref 150–400)
RBC: 4.2 MIL/uL (ref 3.87–5.11)
RDW: 13 % (ref 11.5–15.5)
WBC: 7.4 10*3/uL (ref 4.0–10.5)
nRBC: 0 % (ref 0.0–0.2)

## 2023-03-02 LAB — GLUCOSE, CAPILLARY: Glucose-Capillary: 106 mg/dL — ABNORMAL HIGH (ref 70–99)

## 2023-03-02 LAB — BASIC METABOLIC PANEL
Anion gap: 7 (ref 5–15)
BUN: 12 mg/dL (ref 8–23)
CO2: 31 mmol/L (ref 22–32)
Calcium: 8.6 mg/dL — ABNORMAL LOW (ref 8.9–10.3)
Chloride: 102 mmol/L (ref 98–111)
Creatinine, Ser: 0.86 mg/dL (ref 0.44–1.00)
GFR, Estimated: >60 mL/min (ref >60)
Glucose, Bld: 119 mg/dL — ABNORMAL HIGH (ref 70–99)
Potassium: 4.3 mmol/L (ref 3.5–5.1)
Sodium: 140 mmol/L (ref 135–145)

## 2023-03-02 LAB — MAGNESIUM: Magnesium: 2 mg/dL (ref 1.7–2.4)

## 2023-03-02 LAB — TSH: TSH: 1.911 u[IU]/mL (ref 0.350–4.500)

## 2023-03-02 LAB — BRAIN NATRIURETIC PEPTIDE: B Natriuretic Peptide: 40.5 pg/mL (ref 0.0–100.0)

## 2023-03-02 LAB — C-REACTIVE PROTEIN: CRP: 6.6 mg/dL — ABNORMAL HIGH (ref <1.0)

## 2023-03-02 MED ORDER — IPRATROPIUM-ALBUTEROL 0.5-2.5 (3) MG/3ML IN SOLN
3.0000 mL | Freq: Two times a day (BID) | RESPIRATORY_TRACT | Status: DC
  Administered 2023-03-02: 3 mL via RESPIRATORY_TRACT
  Filled 2023-03-02: qty 3

## 2023-03-02 MED ORDER — AMOXICILLIN-POT CLAVULANATE 875-125 MG PO TABS
1.0000 | ORAL_TABLET | Freq: Two times a day (BID) | ORAL | Status: DC
  Administered 2023-03-02 – 2023-03-04 (×5): 1 via ORAL
  Filled 2023-03-02 (×5): qty 1

## 2023-03-02 MED ORDER — LACTATED RINGERS IV SOLN: INTRAVENOUS | Status: DC

## 2023-03-02 NOTE — Progress Notes (Signed)
Pharmacy note - antibiotic stewardship  Emily [name] is receiving oral clindamycin for sinusitis which was given instead of augmentin due to a penicillin allergy documented in her chart.  I talked with her and she describes the reaction as hives as a teenager, but has tolerated amoxicillin and augmentin since.    Based on the above interview, it has been deemed appropriate for patient to be administered Amoxicillin/Clavulanate .  Asked Dr. Thedore Mins about changing to augmentin and stopping clindamycin.  He agreed - augmentin ordered. Patient received Augmentin at 12:20 and she tolerated it without any problems.  I informed her that she is no longer allergic to penicillin and she can let her pharmacy, physicians and dentist know.  She voiced understanding.  Celedonio Miyamoto, PharmD, BCIDP Clinical Pharmacist Phone [phone]

## 2023-03-02 NOTE — Evaluation (Signed)
Occupational Therapy Evaluation Patient Details Name: Emily [name] MRN: [id number] DOB: 1950/10/16 Today's Date: 03/02/2023   History of Present Illness Pt is a 72 y/o F admitted on 03/01/23 after presenting with c/o a fall after toileting. Pt recently diagnosed with COVID-19 on 02/23/23. Pt admitted for work-up of syncope & collapse. 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 Upon eval, pt with decreased activity tolerance, balance, decreased memory, and slowed processing. Pt performing OOB ADL with supervision assist for safety. Pt with fair awareness of current abilities. Pt educated regarding cognitive compensatory strategies for IADL. Pt to benefit from continued acute OT services, but do not anticipate need for OT follow up after discharge.       If plan is discharge home, recommend the following: A little help with bathing/dressing/bathroom;Assistance with cooking/housework;Assist for transportation;Help with stairs or ramp for entrance;A little help with walking and/or transfers    Functional Status Assessment  Patient has had a recent decline in their functional status and demonstrates the ability to make significant improvements in function in a reasonable and predictable amount of time.  Equipment Recommendations  None recommended by OT    Recommendations for Other Services       Precautions / Restrictions Precautions Precautions: Fall Restrictions Weight Bearing Restrictions: No      Mobility Bed Mobility               General bed mobility comments: OOB in chair on arrival and up with PT on departure    Transfers Overall transfer level: Independent Equipment used: None                      Balance Overall balance assessment: Needs assistance Sitting-balance support: Feet supported Sitting balance-Leahy Scale: Good     Standing  balance support: During functional activity, No upper extremity supported Standing balance-Leahy Scale: Fair                             ADL either performed or assessed with clinical judgement   ADL Overall ADL's : Needs assistance/impaired Eating/Feeding: Independent   Grooming: Supervision/safety;Standing   Upper Body Bathing: Sitting;Independent   Lower Body Bathing: Independent;Sit to/from stand   Upper Body Dressing : Independent;Sitting   Lower Body Dressing: Independent;Sit to/from stand   Toilet Transfer: Supervision/safety   Toileting- Architect and Hygiene: Modified independent   Tub/ Engineer, structural: Walk-in shower;Supervision/safety;Ambulation   Functional mobility during ADLs: Supervision/safety General ADL Comments: supervision for safety during ambulatory transfers     Vision Baseline Vision/History: 1 Wears glasses Ability to See in Adequate Light: 0 Adequate Patient Visual Report: No change from baseline Vision Assessment?: No apparent visual deficits     Perception Perception: Within Functional Limits       Praxis Praxis: WFL       Pertinent Vitals/Pain Pain Assessment Pain Assessment: No/denies pain     Extremity/Trunk Assessment Upper Extremity Assessment Upper Extremity Assessment: Overall WFL for tasks assessed   Lower Extremity Assessment Lower Extremity Assessment: Generalized weakness       Communication Communication Communication: No apparent difficulties   Cognition Arousal: Alert Behavior During Therapy: WFL for tasks assessed/performed Overall Cognitive Status: Impaired/Different from baseline Area of Impairment: Memory, Following commands  Memory: Decreased short-term memory Following Commands: Follows one step commands with increased time, Follows multi-step commands with increased time       General Comments: Pt reporting she was experiencing a signfiicant amount  of "fogginess" when at home, but feels like it is clearing up. Does require increased time for memory and experiencing mild memory difficulty. Education provided regarding use of compensatory techniques and to reach back out to MD if this does not clear up     General Comments  SpO2 >90% on RA during session although on 1L during session. Educated regarding frequent mobility    Exercises     Shoulder Instructions      Home Living Family/patient expects to be discharged to:: Private residence Living Arrangements: Spouse/significant other Available Help at Discharge: Family Type of Home: House Home Access: Stairs to enter Secretary/administrator of Steps: 3 Entrance Stairs-Rails:  (has columns but not rails) Home Layout: One level     Bathroom Shower/Tub: Producer, television/film/video: Standard     Home Equipment: Agricultural consultant (2 wheels);Cane - single point;Shower seat - built in          Prior Functioning/Environment Prior Level of Function : Independent/Modified Independent;Working/employed;Driving             Mobility Comments: Pt reports she's independent without AD, only 1 fall in the past 6 months and that was the one that occurred prior to admission. 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 Husband Nadine Counts often performs home chores since R knee surder 1 year ago but pt is able to perform as needed        OT Problem List: Decreased activity tolerance;Impaired balance (sitting and/or standing);Decreased strength;Decreased cognition;Cardiopulmonary status limiting activity      OT Treatment/Interventions: Self-care/ADL training;Therapeutic exercise;DME and/or AE instruction;Balance training;Patient/family education;Therapeutic activities;Cognitive remediation/compensation    OT Goals(Current goals can be found in the care plan section) Acute Rehab OT Goals Patient Stated  Goal: go home OT Goal Formulation: With patient Time For Goal Achievement: 03/16/23 Potential to Achieve Goals: Good  OT Frequency: Min 1X/week    Co-evaluation              AM-PAC OT "6 Clicks" Daily Activity     Outcome Measure Help from another person eating meals?: None Help from another person taking care of personal grooming?: A Little Help from another person toileting, which includes using toliet, bedpan, or urinal?: A Little Help from another person bathing (including washing, rinsing, drying)?: A Little Help from another person to put on and taking off regular upper body clothing?: None Help from another person to put on and taking off regular lower body clothing?: None 6 Click Score: 21   End of Session Equipment Utilized During Treatment: Gait belt Nurse Communication: Mobility status  Activity Tolerance: Patient tolerated treatment well Patient left: Other (comment) (up in room with PT)  OT Visit Diagnosis: Unsteadiness on feet (R26.81);Muscle weakness (generalized) (M62.81);Other symptoms and signs involving cognitive function                Time: 1110-1138 OT Time Calculation (min): 28 min Charges:  OT General Charges $OT Visit: 1 Visit OT Evaluation $OT Eval Low Complexity: 1 Low OT Treatments $Self Care/Home Management : 8-22 mins  Tyler Deis, OTR/L [facility] Acute Rehabilitation Office: [phone]   Myrla Halsted 03/02/2023, 2:44 PM

## 2023-03-02 NOTE — Progress Notes (Signed)
Carotid arterial duplex completed. Please see CV Procedures for preliminary results.  Shona Simpson, RVT 03/02/23 4:07 PM

## 2023-03-02 NOTE — Care Management Obs Status (Signed)
MEDICARE OBSERVATION STATUS NOTIFICATION   Patient Details  Name: Emily [name] MRN: [id number] Date of Birth: 1950-09-28   Medicare Observation Status Notification Given:  Yes  Over phone due to remote verbal permission to sign  Lockie Pares, RN 03/02/2023, 3:32 PM

## 2023-03-02 NOTE — Progress Notes (Signed)
PROGRESS NOTE                                                                                                                                                                                                             Patient Demographics:    Emily [name], is a 72 y.o. female, DOB - February 01, 1951, ZOX:[id number]  Outpatient Primary MD for the patient is Early, Sung Amabile, NP    LOS - 0  Admit date - 03/01/2023    Chief Complaint  Patient presents with   Fatigue   Fall       Brief Narrative (HPI from H&P)   72 y.o. female w/ PMH vertigo on meclizine prn, anxiety/depression on home Xanax Wellbutrin Trazodone, history asthma/COPD on home albuterol prn and Trelegy, sinus/allergies on Flonase, HTN on chlorthalidone, DM2 on home farxiga, takes ASA.  He was recently diagnosed with COVID-19 infection on 02/23/2023, was started on Paxlovid on the same day.  Since then has been not eating or drinking well feeling weak all over, went to the bathroom yesterday and when getting up off the commode she fell on the floor, felt very weak, did not lose consciousness, no seizure-like activity.  No incontinence.  Brought to the ER and admitted for syncopal workup.   Subjective:    Glema Comfort today has, No headache, No chest pain, No abdominal pain - No Nausea, No new weakness tingling or numbness, shortness of breath, generalized weakness all over.   Assessment  & Plan :    Syncope and collapse.  No loss of consciousness.  She was not eating or drinking well for the last several days due to COVID-19 infection, clinically dehydrated, initiate IV fluids, TED stockings, PT OT, still feels weak all over.  CTA lungs, CT head and C-spine nonacute, recent echocardiogram from few months ago noted and stable.  Advance activity, PT OT, monitor orthostatics, carotid ultrasound ordered upon admission will be followed up.   Recent COVID-19 infection.  Finished  Paxlovid treatment.  Still has a dry cough, she did have hypoxia at the time of admission, hence will monitor inflammatory markers, nebulizer treatments, encouraged to sit in chair use I-S and flutter valve for pulmonary toiletry.  Advance activity, titrate down oxygen.  History of asthma and COPD.  See above  Hypertension.  On Farxiga and chlorthalidone.  Hold.  History of glaucoma.  Continue home eyedrops of supportive care.      Condition - Fair  Family Communication  : Husband bedside on 03/02/2023  Code Status : DNR/DNI  Consults  : None  PUD Prophylaxis :    Procedures  :     Carotid ultrasound.    Recent echocardiogram.  01/11/23 -  1. 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 The is  no evidence of LV outflow tract obstruction. Left ventricular ejection fraction, by estimation, is 55 to 60%. The left ventricle has normal function. The left ventricle has no regional wall motion abnormalities. There is mild concentric left ventricular hypertrophy. Left ventricular diastolic parameters are consistent with Grade I diastolic dysfunction (impaired relaxation).   2. Right ventricular systolic function is normal. The right ventricular size is normal. Tricuspid regurgitation signal is inadequate for assessing PA pressure.   3. The mitral valve is grossly normal. Trivial mitral valve regurgitation.   4. The aortic valve is tricuspid. There is mild calcification of the aortic valve. There is mild thickening of the aortic valve. Aortic valve regurgitation is not visualized.   5. The inferior vena cava is normal in size with greater than 50% respiratory variability, suggesting right atrial pressure of 3 mmHg.   CTA Lungs - 1. No central or segmental pulmonary embolus. Limited evaluation at the subsegmental level due to motion artifact. 2. Bilateral upper lobe peribronchovascular patchy airspace  opacity suggestive of infection/inflammation. 3. Tiny hiatal hernia.  CT head and C-spine. CT head: 1. No evidence of an acute intracranial abnormality. 2. Paranasal sinus disease at the imaged levels, as described. 3. Small-volume fluid within the right mastoid air cells. CT cervical spine: 1. No evidence of an acute cervical spine fracture. 2. Dextrocurvature of the cervical spine. 3. 2 mm C2-C3 grade 1 retrolisthesis. 4. Cervical spondylosis as described. At C5-C6, a posterior disc osteophyte complex contributes to suspected severe spinal canal stenosis      Disposition Plan  :    Status is: Observation  DVT Prophylaxis  :    Place TED hose Start: 03/02/23 0740 enoxaparin (LOVENOX) injection 40 mg Start: 03/01/23 1600   Lab Results  Component Value Date   PLT 253 03/02/2023    Diet :  Diet Order             Diet regular Room service appropriate? Yes; Fluid consistency: Thin  Diet effective now                    Inpatient Medications  Scheduled Meds:  aspirin EC  81 mg Oral Daily   buPROPion  150 mg Oral q morning   clindamycin  300 mg Oral Q8H   cycloSPORINE  1 drop Both Eyes BID   enoxaparin (LOVENOX) injection  40 mg Subcutaneous Q24H   fluticasone  1 spray Each Nare Daily   guaiFENesin  1,200 mg Oral BID   ketotifen  2 drop Both Eyes BID   sodium chloride flush  3 mL Intravenous Q12H   traZODone  200 mg Oral QHS   Continuous Infusions:  lactated ringers     PRN Meds:.acetaminophen **OR** acetaminophen, ipratropium-albuterol, ondansetron **OR** ondansetron (ZOFRAN) IV, oxyCODONE, polyethylene glycol  Antibiotics  :    Anti-infectives (From admission, onward)    Start     Dose/Rate Route Frequency Ordered Stop   03/01/23 2200  clindamycin (CLEOCIN) capsule 300 mg  300 mg Oral Every 8 hours 03/01/23 1918 03/06/23 2159         Objective:   Vitals:   03/02/23 0400 03/02/23 0617 03/02/23 0659 03/02/23 0702  BP: 121/76     Pulse: 68 95     Resp: 15     Temp:      TempSrc:      SpO2: 94% 93% (!) 86% 92%  Weight:    114.9 kg  Height:        Wt Readings from Last 3 Encounters:  03/02/23 114.9 kg  02/23/23 113.4 kg  11/28/22 116.6 kg     Intake/Output Summary (Last 24 hours) at 03/02/2023 0757 Last data filed at 03/02/2023 0159 Gross per 24 hour  Intake 1165.03 ml  Output --  Net 1165.03 ml     Physical Exam  Awake Alert, No new F.N deficits, Normal affect Scott.AT,PERRAL Supple Neck, No JVD,   Symmetrical Chest wall movement, Good air movement bilaterally, CTAB RRR,No Gallops,Rubs or new Murmurs,  +ve B.Sounds, Abd Soft, No tenderness,   No Cyanosis, Clubbing or edema        Data Review:    Recent Labs  Lab 03/01/23 0925 03/01/23 0937 03/02/23 0633  WBC 12.1*  --  7.4  HGB 14.3 14.6 12.6  HCT 44.6 43.0 38.5  PLT 268  --  253  MCV 93.7  --  91.7  MCH 30.0  --  30.0  MCHC 32.1  --  32.7  RDW 12.8  --  13.0  LYMPHSABS 0.8  --  1.3  MONOABS 0.8  --  0.6  EOSABS 0.1  --  0.2  BASOSABS 0.0  --  0.0    Recent Labs  Lab 03/01/23 0925 03/01/23 0937 03/02/23 0633  NA 136 138 140  K 4.6 4.6 4.3  CL 98  --  102  CO2 29  --  31  ANIONGAP 9  --  7  GLUCOSE 153*  --  119*  BUN 12  --  12  CREATININE 0.98  0.96  --  0.86  AST 19  --   --   ALT 22  --   --   ALKPHOS 49  --   --   BILITOT 0.5  --   --   ALBUMIN 3.3*  --   --   DDIMER 0.61*  --   --   TSH  --   --  1.911  BNP  --   --  40.5  MG  --   --  2.0  CALCIUM 8.8*  --  8.6*      Recent Labs  Lab 03/01/23 0925 03/02/23 0633  DDIMER 0.61*  --   TSH  --  1.911  BNP  --  40.5  MG  --  2.0  CALCIUM 8.8* 8.6*    --------------------------------------------------------------------------------------------------------------- Lab Results  Component Value Date   CHOL 181 10/14/2021   HDL 59 10/14/2021   LDLCALC 100 (H) 10/14/2021   TRIG 127 10/14/2021   CHOLHDL 3.1 10/14/2021    Lab Results  Component Value Date   HGBA1C  5.3 02/12/2020   Recent Labs    03/02/23 0633  TSH 1.911   No results for input(s): "VITAMINB12", "FOLATE", "FERRITIN", "TIBC", "IRON", "RETICCTPCT" in the last 72 hours. ------------------------------------------------------------------------------------------------------------------ Cardiac Enzymes No results for input(s): "CKMB", "TROPONINI", "MYOGLOBIN" in the last 168 hours.  Invalid input(s): "CK"  Micro Results Recent Results (from the past 240 hour(s))  SARS Coronavirus 2 by RT  PCR (hospital order, performed in [facility] lab) *cepheid single result test* Anterior Nasal Swab     Status: Abnormal   Collection Time: 03/01/23  9:02 AM   Specimen: Anterior Nasal Swab  Result Value Ref Range Status   SARS Coronavirus 2 by RT PCR POSITIVE (A) NEGATIVE Final    Comment: Performed at [facility], [address], [place], [place] [address]    Radiology Reports CT Angio Chest PE W and/or Wo Contrast  Result Date: 03/01/2023 CLINICAL DATA:  Pt got dizzy while in the shower and then had a syncopal episode. Pt is positive for covid. Also having fatigue EXAM: CT ANGIOGRAPHY CHEST WITH CONTRAST TECHNIQUE: Multidetector CT imaging of the chest was performed using the standard protocol during bolus administration of intravenous contrast. Multiplanar CT image reconstructions and MIPs were obtained to evaluate the vascular anatomy. RADIATION DOSE REDUCTION: This exam was performed according to the departmental dose-optimization program which includes automated exposure control, adjustment of the mA and/or kV according to patient size and/or use of iterative reconstruction technique. CONTRAST:  75mL OMNIPAQUE IOHEXOL 350 MG/ML SOLN COMPARISON:  Chest x-ray 02/28/2023 FINDINGS: Cardiovascular: Satisfactory opacification of the pulmonary arteries to the segmental level. No evidence of pulmonary embolism. Limited evaluation at subsegmental level due to motion artifact. The main pulmonary  artery is normal in caliber. Normal heart size. No significant pericardial effusion. The thoracic aorta is normal in caliber. No atherosclerotic plaque of the thoracic aorta. No coronary artery calcifications. Mediastinum/Nodes: No enlarged mediastinal, hilar, or axillary lymph nodes. Thyroid gland, trachea, and esophagus demonstrate no significant findings. Tiny hiatal hernia. Lungs/Pleura: Patchy peribronchovascular bilateral upper lobe airspace opacities. No pulmonary nodule. No pulmonary mass. No pleural effusion. No pneumothorax. Upper Abdomen: No acute abnormality. Musculoskeletal: No chest wall abnormality. No suspicious lytic or blastic osseous lesions. No acute displaced fracture. Review of the MIP images confirms the above findings. IMPRESSION: 1. No central or segmental pulmonary embolus. Limited evaluation at the subsegmental level due to motion artifact. 2. Bilateral upper lobe peribronchovascular patchy airspace opacity suggestive of infection/inflammation. 3. Tiny hiatal hernia. Electronically Signed   By: Tish Frederickson M.D.   On: 03/01/2023 12:44   CT HEAD WO CONTRAST  Result Date: 03/01/2023 CLINICAL DATA:  Provided history: Neck trauma. Facial trauma, blunt. Additional history provided: Dizziness with subsequent syncopal episode in shower earlier today, COVID positive, lethargy, confusion. EXAM: CT HEAD WITHOUT CONTRAST CT CERVICAL SPINE WITHOUT CONTRAST TECHNIQUE: Multidetector CT imaging of the head and cervical spine was performed following the standard protocol without intravenous contrast. Multiplanar CT image reconstructions of the cervical spine were also generated. RADIATION DOSE REDUCTION: This exam was performed according to the departmental dose-optimization program which includes automated exposure control, adjustment of the mA and/or kV according to patient size and/or use of iterative reconstruction technique. COMPARISON:  Head CT 03/29/2022. Brain MRI 09/13/2009. Cervical spine  CT 07/17/2018. FINDINGS: CT HEAD FINDINGS Brain: No age advanced or lobar predominant parenchymal atrophy. There is no acute intracranial hemorrhage. No demarcated cortical infarct. No extra-axial fluid collection. No evidence of an intracranial mass. No midline shift. Vascular: No hyperdense vessel. Skull: No calvarial fracture or aggressive osseous lesion. Sinuses/Orbits: No mass or acute finding within the imaged orbits. Mild mucosal thickening within the bilateral maxillary sinuses at the imaged levels. Mild-to-moderate mucosal thickening within the bilateral sphenoid sinuses. Mucosal thickening within bilateral ethmoid air cells, overall moderate in severity. Mild mucosal thickening within the bilateral frontal sinuses. Other: Small-volume fluid within the right  mastoid air cells. CT CERVICAL SPINE FINDINGS Alignment: Dextrocurvature of the cervical spine. 2 mm C2-C3 grade 1 retrolisthesis. Skull base and vertebrae: The basion-dental and atlanto-dental intervals are maintained.No evidence of acute fracture to the cervical spine. Soft tissues and spinal canal: No prevertebral fluid or swelling. No visible canal hematoma. Disc levels: Cervical spondylosis with multilevel disc space narrowing, disc bulges/central disc protrusions, uncovertebral hypertrophy and facet arthrosis. Disc space narrowing is greatest at C5-C6 (moderate to advanced at this level). Multilevel spinal canal narrowing. Most notably at C5-C6, a posterior disc osteophyte complex contributes to suspected severe spinal canal stenosis. Multilevel bony neural foraminal narrowing. Upper chest: No consolidation within the imaged lung apices. No visible pneumothorax. IMPRESSION: CT head: 1. No evidence of an acute intracranial abnormality. 2. Paranasal sinus disease at the imaged levels, as described. 3. Small-volume fluid within the right mastoid air cells. CT cervical spine: 1. No evidence of an acute cervical spine fracture. 2. Dextrocurvature of  the cervical spine. 3. 2 mm C2-C3 grade 1 retrolisthesis. 4. Cervical spondylosis as described. At C5-C6, a posterior disc osteophyte complex contributes to suspected severe spinal canal stenosis. Electronically Signed   By: Jackey Loge D.O.   On: 03/01/2023 12:16   CT CERVICAL SPINE WO CONTRAST  Result Date: 03/01/2023 CLINICAL DATA:  Provided history: Neck trauma. Facial trauma, blunt. Additional history provided: Dizziness with subsequent syncopal episode in shower earlier today, COVID positive, lethargy, confusion. EXAM: CT HEAD WITHOUT CONTRAST CT CERVICAL SPINE WITHOUT CONTRAST TECHNIQUE: Multidetector CT imaging of the head and cervical spine was performed following the standard protocol without intravenous contrast. Multiplanar CT image reconstructions of the cervical spine were also generated. RADIATION DOSE REDUCTION: This exam was performed according to the departmental dose-optimization program which includes automated exposure control, adjustment of the mA and/or kV according to patient size and/or use of iterative reconstruction technique. COMPARISON:  Head CT 03/29/2022. Brain MRI 09/13/2009. Cervical spine CT 07/17/2018. FINDINGS: CT HEAD FINDINGS Brain: No age advanced or lobar predominant parenchymal atrophy. There is no acute intracranial hemorrhage. No demarcated cortical infarct. No extra-axial fluid collection. No evidence of an intracranial mass. No midline shift. Vascular: No hyperdense vessel. Skull: No calvarial fracture or aggressive osseous lesion. Sinuses/Orbits: No mass or acute finding within the imaged orbits. Mild mucosal thickening within the bilateral maxillary sinuses at the imaged levels. Mild-to-moderate mucosal thickening within the bilateral sphenoid sinuses. Mucosal thickening within bilateral ethmoid air cells, overall moderate in severity. Mild mucosal thickening within the bilateral frontal sinuses. Other: Small-volume fluid within the right mastoid air cells. CT  CERVICAL SPINE FINDINGS Alignment: Dextrocurvature of the cervical spine. 2 mm C2-C3 grade 1 retrolisthesis. Skull base and vertebrae: The basion-dental and atlanto-dental intervals are maintained.No evidence of acute fracture to the cervical spine. Soft tissues and spinal canal: No prevertebral fluid or swelling. No visible canal hematoma. Disc levels: Cervical spondylosis with multilevel disc space narrowing, disc bulges/central disc protrusions, uncovertebral hypertrophy and facet arthrosis. Disc space narrowing is greatest at C5-C6 (moderate to advanced at this level). Multilevel spinal canal narrowing. Most notably at C5-C6, a posterior disc osteophyte complex contributes to suspected severe spinal canal stenosis. Multilevel bony neural foraminal narrowing. Upper chest: No consolidation within the imaged lung apices. No visible pneumothorax. IMPRESSION: CT head: 1. No evidence of an acute intracranial abnormality. 2. Paranasal sinus disease at the imaged levels, as described. 3. Small-volume fluid within the right mastoid air cells. CT cervical spine: 1. No evidence of an acute cervical spine fracture. 2. Dextrocurvature  of the cervical spine. 3. 2 mm C2-C3 grade 1 retrolisthesis. 4. Cervical spondylosis as described. At C5-C6, a posterior disc osteophyte complex contributes to suspected severe spinal canal stenosis. Electronically Signed   By: Jackey Loge D.O.   On: 03/01/2023 12:16   DG Chest Port 1 View  Result Date: 03/01/2023 CLINICAL DATA:  Shortness of breath and hypoxia EXAM: PORTABLE CHEST 1 VIEW COMPARISON:  08/09/2018 FINDINGS: The heart size and mediastinal contours are within normal limits. Underinflation. Slight elevation of the right hemidiaphragm. No consolidation, pneumothorax or effusion. No edema. Overlapping cardiac leads. The visualized skeletal structures are unremarkable. IMPRESSION: Underinflation. Elevated right hemidiaphragm. No acute cardiopulmonary disease Electronically Signed    By: Baylei Kays M.D.   On: 03/01/2023 10:25      Signature  -   Susa Raring M.D on 03/02/2023 at 7:57 AM   -  To page go to [email or web address]

## 2023-03-02 NOTE — Plan of Care (Signed)
  Problem: Education: Goal: Knowledge of condition and prescribed therapy will improve Outcome: Progressing   Problem: Education: Goal: Knowledge of General Education information will improve Description: Including pain rating scale, medication(s)/side effects and non-pharmacologic comfort measures Outcome: Progressing   Problem: Activity: Goal: Risk for activity intolerance will decrease Outcome: Progressing   Problem: Nutrition: Goal: Adequate nutrition will be maintained Outcome: Progressing   Problem: Coping: Goal: Level of anxiety will decrease Outcome: Progressing   Problem: Skin Integrity: Goal: Risk for impaired skin integrity will decrease Outcome: Progressing   Problem: Safety: Goal: Ability to remain free from injury will improve Outcome: Progressing

## 2023-03-02 NOTE — Evaluation (Signed)
Physical Therapy Evaluation Patient Details Name: Emily [name] MRN: [id number] DOB: 18-Jul-1950 Today's Date: 03/02/2023  History of Present Illness  Pt is a 72 y/o F admitted on 03/01/23 after presenting with c/o a fall after toileting. Pt recently diagnosed with COVID-19 on 02/23/23. Pt admitted for work-up of syncope & collapse. PMH: vertigo on meclizine, anxiety, depression, asthma, COPD, HTN, DM2  Clinical Impression  Pt seen for PT evaluation with pt agreeable, received in handoff from OT. Pt reports prior to admission she was independent without AD, living with her significant other in a 1 level home with 3 steps to enter. On this date, pt is able to ambulate without AD with supervision. Pt does appear to have delayed processing at times during session. Will continue to follow pt acutely to address balance, activity tolerance, gait & stair negotiation.        If plan is discharge home, recommend the following: A little help with walking and/or transfers;A little help with bathing/dressing/bathroom;Assistance with cooking/housework;Help with stairs or ramp for entrance;Assist for transportation   Can travel by private vehicle        Equipment Recommendations None recommended by PT  Recommendations for Other Services       Functional Status Assessment Patient has had a recent decline in their functional status and demonstrates the ability to make significant improvements in function in a reasonable and predictable amount of time.     Precautions / Restrictions Precautions Precautions: Fall Restrictions Weight Bearing Restrictions: No      Mobility  Bed Mobility               General bed mobility comments: not tested, pt received up in room with OT    Transfers Overall transfer level: Independent Equipment used: None               General transfer comment: STS without BUE support without AD    Ambulation/Gait Ambulation/Gait assistance:  Supervision Gait Distance (Feet): 50 Feet (multiple laps in room) Assistive device: None (Pt initially holding to objects/furniture for support but pt able to ambulate without UE support with cuing to attempt.) Gait Pattern/deviations: Decreased step length - left, Decreased step length - right, Decreased dorsiflexion - right, Decreased dorsiflexion - left, Decreased stride length Gait velocity: decreased     General Gait Details: slow, guarded  Stairs            Wheelchair Mobility     Tilt Bed    Modified Rankin (Stroke Patients Only)       Balance Overall balance assessment: Needs assistance Sitting-balance support: Feet supported Sitting balance-Leahy Scale: Good     Standing balance support: During functional activity, No upper extremity supported Standing balance-Leahy Scale: Fair                               Pertinent Vitals/Pain Pain Assessment Pain Assessment: No/denies pain    Home Living Family/patient expects to be discharged to:: Private residence Living Arrangements: Spouse/significant other Available Help at Discharge: Family Type of Home: House Home Access: Stairs to enter Entrance Stairs-Rails:  (has columns but not rails) Entrance Stairs-Number of Steps: 3   Home Layout: One level        Prior Function               Mobility Comments: Pt reports she's independent without AD, only 1 fall in the past 6 months and that was the one  that occurred prior to admission.       Extremity/Trunk Assessment   Upper Extremity Assessment Upper Extremity Assessment: Overall WFL for tasks assessed    Lower Extremity Assessment Lower Extremity Assessment: Generalized weakness       Communication   Communication Communication: No apparent difficulties  Cognition Arousal: Alert Behavior During Therapy: WFL for tasks assessed/performed Overall Cognitive Status: Within Functional Limits for tasks assessed Area of Impairment:  Memory                     Memory: Decreased short-term memory Following Commands: Follows one step commands with increased time, Follows multi-step commands with increased time                General Comments General comments (skin integrity, edema, etc.): SPO2 >/= 90% on room air, pt left on room air & nurse made aware. Reviewed use of incentive spirometer & acapella flutter valve with pt. Educated pt on importance of OOB mobility.    Exercises Other Exercises Other Exercises: Pt performed 7x STS from EOB without BUE support with supervision for BLE strengthening & overall endurance training; pt attempting 10 reps but unable 2/2 fatigue.   Assessment/Plan    PT Assessment Patient needs continued PT services  PT Problem List Decreased strength;Cardiopulmonary status limiting activity;Decreased activity tolerance;Decreased balance;Decreased mobility;Decreased safety awareness;Decreased knowledge of use of DME;Decreased cognition       PT Treatment Interventions DME instruction;Balance training;Gait training;Neuromuscular re-education;Stair training;Functional mobility training;Patient/family education;Cognitive remediation;Therapeutic activities;Manual techniques;Therapeutic exercise    PT Goals (Current goals can be found in the Care Plan section)  Acute Rehab PT Goals Patient Stated Goal: get better PT Goal Formulation: With patient Time For Goal Achievement: 03/16/23 Potential to Achieve Goals: Good    Frequency Min 1X/week     Co-evaluation               AM-PAC PT "6 Clicks" Mobility  Outcome Measure Help needed turning from your back to your side while in a flat bed without using bedrails?: None Help needed moving from lying on your back to sitting on the side of a flat bed without using bedrails?: None Help needed moving to and from a bed to a chair (including a wheelchair)?: A Little Help needed standing up from a chair using your arms (e.g.,  wheelchair or bedside chair)?: None Help needed to walk in hospital room?: A Little Help needed climbing 3-5 steps with a railing? : A Little 6 Click Score: 21    End of Session   Activity Tolerance: Patient tolerated treatment well;Patient limited by fatigue Patient left: in chair;with chair alarm set;with call bell/phone within reach Nurse Communication: Mobility status PT Visit Diagnosis: Muscle weakness (generalized) (M62.81);Unsteadiness on feet (R26.81)    Time: 9528-4132 PT Time Calculation (min) (ACUTE ONLY): 10 min   Charges:   PT Evaluation $PT Eval Low Complexity: 1 Low   PT General Charges $$ ACUTE PT VISIT: 1 Visit         Aleda Grana, PT, DPT 03/02/23, 12:56 PM   Sandi Mariscal 03/02/2023, 12:55 PM

## 2023-03-03 DIAGNOSIS — I10 Essential (primary) hypertension: Secondary | ICD-10-CM | POA: Diagnosis not present

## 2023-03-03 DIAGNOSIS — F411 Generalized anxiety disorder: Secondary | ICD-10-CM | POA: Diagnosis not present

## 2023-03-03 DIAGNOSIS — F339 Major depressive disorder, recurrent, unspecified: Secondary | ICD-10-CM | POA: Diagnosis not present

## 2023-03-03 DIAGNOSIS — R55 Syncope and collapse: Secondary | ICD-10-CM | POA: Diagnosis not present

## 2023-03-03 DIAGNOSIS — J9601 Acute respiratory failure with hypoxia: Secondary | ICD-10-CM | POA: Diagnosis not present

## 2023-03-03 DIAGNOSIS — E669 Obesity, unspecified: Secondary | ICD-10-CM | POA: Diagnosis not present

## 2023-03-03 DIAGNOSIS — Z880 Allergy status to penicillin: Secondary | ICD-10-CM | POA: Diagnosis not present

## 2023-03-03 DIAGNOSIS — Z881 Allergy status to other antibiotic agents status: Secondary | ICD-10-CM | POA: Diagnosis not present

## 2023-03-03 DIAGNOSIS — Z66 Do not resuscitate: Secondary | ICD-10-CM | POA: Diagnosis not present

## 2023-03-03 DIAGNOSIS — Z79899 Other long term (current) drug therapy: Secondary | ICD-10-CM | POA: Diagnosis not present

## 2023-03-03 DIAGNOSIS — E860 Dehydration: Secondary | ICD-10-CM | POA: Diagnosis not present

## 2023-03-03 DIAGNOSIS — Z7982 Long term (current) use of aspirin: Secondary | ICD-10-CM | POA: Diagnosis not present

## 2023-03-03 DIAGNOSIS — M4802 Spinal stenosis, cervical region: Secondary | ICD-10-CM | POA: Diagnosis not present

## 2023-03-03 DIAGNOSIS — J4489 Other specified chronic obstructive pulmonary disease: Secondary | ICD-10-CM | POA: Diagnosis not present

## 2023-03-03 DIAGNOSIS — W19XXXA Unspecified fall, initial encounter: Secondary | ICD-10-CM | POA: Diagnosis present

## 2023-03-03 DIAGNOSIS — E119 Type 2 diabetes mellitus without complications: Secondary | ICD-10-CM | POA: Diagnosis not present

## 2023-03-03 DIAGNOSIS — U071 COVID-19: Secondary | ICD-10-CM | POA: Diagnosis not present

## 2023-03-03 DIAGNOSIS — R0902 Hypoxemia: Secondary | ICD-10-CM | POA: Diagnosis present

## 2023-03-03 DIAGNOSIS — Z888 Allergy status to other drugs, medicaments and biological substances status: Secondary | ICD-10-CM | POA: Diagnosis not present

## 2023-03-03 DIAGNOSIS — Z6841 Body Mass Index (BMI) 40.0 and over, adult: Secondary | ICD-10-CM | POA: Diagnosis not present

## 2023-03-03 DIAGNOSIS — Z90710 Acquired absence of both cervix and uterus: Secondary | ICD-10-CM | POA: Diagnosis not present

## 2023-03-03 DIAGNOSIS — J328 Other chronic sinusitis: Secondary | ICD-10-CM | POA: Diagnosis not present

## 2023-03-03 DIAGNOSIS — H409 Unspecified glaucoma: Secondary | ICD-10-CM | POA: Diagnosis not present

## 2023-03-03 DIAGNOSIS — R6510 Systemic inflammatory response syndrome (SIRS) of non-infectious origin without acute organ dysfunction: Secondary | ICD-10-CM | POA: Diagnosis not present

## 2023-03-03 DIAGNOSIS — K760 Fatty (change of) liver, not elsewhere classified: Secondary | ICD-10-CM | POA: Diagnosis not present

## 2023-03-03 LAB — BASIC METABOLIC PANEL
Anion gap: 9 (ref 5–15)
BUN: 12 mg/dL (ref 8–23)
CO2: 28 mmol/L (ref 22–32)
Calcium: 8.7 mg/dL — ABNORMAL LOW (ref 8.9–10.3)
Chloride: 101 mmol/L (ref 98–111)
Creatinine, Ser: 0.92 mg/dL (ref 0.44–1.00)
GFR, Estimated: >60 mL/min (ref >60)
Glucose, Bld: 100 mg/dL — ABNORMAL HIGH (ref 70–99)
Potassium: 3.8 mmol/L (ref 3.5–5.1)
Sodium: 138 mmol/L (ref 135–145)

## 2023-03-03 LAB — CBC WITH DIFFERENTIAL/PLATELET
Abs Immature Granulocytes: 0.08 10*3/uL — ABNORMAL HIGH (ref 0.00–0.07)
Basophils Absolute: 0 10*3/uL (ref 0.0–0.1)
Basophils Relative: 1 %
Eosinophils Absolute: 0.2 10*3/uL (ref 0.0–0.5)
Eosinophils Relative: 3 %
HCT: 36.7 % (ref 36.0–46.0)
Hemoglobin: 12.2 g/dL (ref 12.0–15.0)
Immature Granulocytes: 1 %
Lymphocytes Relative: 23 %
Lymphs Abs: 1.4 10*3/uL (ref 0.7–4.0)
MCH: 30 pg (ref 26.0–34.0)
MCHC: 33.2 g/dL (ref 30.0–36.0)
MCV: 90.4 fL (ref 80.0–100.0)
Monocytes Absolute: 0.6 10*3/uL (ref 0.1–1.0)
Monocytes Relative: 10 %
Neutro Abs: 3.7 10*3/uL (ref 1.7–7.7)
Neutrophils Relative %: 62 %
Platelets: 288 10*3/uL (ref 150–400)
RBC: 4.06 MIL/uL (ref 3.87–5.11)
RDW: 12.8 % (ref 11.5–15.5)
WBC: 6 10*3/uL (ref 4.0–10.5)
nRBC: 0 % (ref 0.0–0.2)

## 2023-03-03 LAB — GLUCOSE, CAPILLARY
Glucose-Capillary: 108 mg/dL — ABNORMAL HIGH (ref 70–99)
Glucose-Capillary: 114 mg/dL — ABNORMAL HIGH (ref 70–99)
Glucose-Capillary: 141 mg/dL — ABNORMAL HIGH (ref 70–99)
Glucose-Capillary: 97 mg/dL (ref 70–99)

## 2023-03-03 LAB — BRAIN NATRIURETIC PEPTIDE: B Natriuretic Peptide: 76.4 pg/mL (ref 0.0–100.0)

## 2023-03-03 LAB — C-REACTIVE PROTEIN: CRP: 4.3 mg/dL — ABNORMAL HIGH (ref <1.0)

## 2023-03-03 LAB — MAGNESIUM: Magnesium: 1.9 mg/dL (ref 1.7–2.4)

## 2023-03-03 LAB — PROCALCITONIN: Procalcitonin: 0.11 ng/mL

## 2023-03-03 MED ORDER — LACTATED RINGERS IV SOLN: INTRAVENOUS | Status: DC

## 2023-03-03 NOTE — Progress Notes (Signed)
PROGRESS NOTE                                                                                                                                                                                                             Patient Demographics:    Emily [name], is a 72 y.o. female, DOB - Nov 11, 1950, WUJ:[id number]  Outpatient Primary MD for the patient is Early, Sung Amabile, NP    LOS - 0  Admit date - 03/01/2023    Chief Complaint  Patient presents with   Fatigue   Fall       Brief Narrative (HPI from H&P)   72 y.o. female w/ PMH vertigo on meclizine prn, anxiety/depression on home Xanax Wellbutrin Trazodone, history asthma/COPD on home albuterol prn and Trelegy, sinus/allergies on Flonase, HTN on chlorthalidone, DM2 on home farxiga, takes ASA.  He was recently diagnosed with COVID-19 infection on 02/23/2023, was started on Paxlovid on the same day.  Since then has been not eating or drinking well feeling weak all over, went to the bathroom yesterday and when getting up off the commode she fell on the floor, felt very weak, did not lose consciousness, no seizure-like activity.  No incontinence.  Brought to the ER and admitted for syncopal workup.   Subjective:   Patient in bed, appears comfortable, denies any headache, no fever, no chest pain or pressure, no shortness of breath , no abdominal pain. No focal weakness.  Does have generalized weakness which is gradually improving.   Assessment  & Plan :    Syncope and collapse.  No loss of consciousness.  She was not eating or drinking well for the last several days due to COVID-19 infection, clinically dehydrated, initiate IV fluids, TED stockings, PT OT, still feels weak all over.  CTA lungs, CT head and C-spine nonacute, recent echocardiogram from few months ago noted and stable.  Advance activity, PT OT, carotid ultrasound stable, however she is orthostatic, continue hydration, TED  stockings advance activity and monitor.   Recent COVID-19 infection.  Finished Paxlovid treatment.  Still has a dry cough, she did have hypoxia at the time of admission, hence will monitor inflammatory markers, nebulizer treatments, encouraged to sit in chair use I-S and flutter valve for pulmonary toiletry.  Advance activity, titrate down oxygen.  History of asthma and COPD.  See above  Hypertension.  On Farxiga and chlorthalidone.  Hold.  History of glaucoma.  Continue home eyedrops of supportive care.      Condition - Fair  Family Communication  : Husband bedside on 03/02/2023, updated bedside again 03/03/2023  Code Status : DNR/DNI  Consults  : None  PUD Prophylaxis :    Procedures  :     Carotid ultrasound.  Nonacute.  Recent echocardiogram.  01/11/23 -  1. 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 The is  no evidence of LV outflow tract obstruction. Left ventricular ejection fraction, by estimation, is 55 to 60%. The left ventricle has normal function. The left ventricle has no regional wall motion abnormalities. There is mild concentric left ventricular hypertrophy. Left ventricular diastolic parameters are consistent with Grade I diastolic dysfunction (impaired relaxation).   2. Right ventricular systolic function is normal. The right ventricular size is normal. Tricuspid regurgitation signal is inadequate for assessing PA pressure.   3. The mitral valve is grossly normal. Trivial mitral valve regurgitation.   4. The aortic valve is tricuspid. There is mild calcification of the aortic valve. There is mild thickening of the aortic valve. Aortic valve regurgitation is not visualized.   5. The inferior vena cava is normal in size with greater than 50% respiratory variability, suggesting right atrial pressure of 3 mmHg.   CTA Lungs - 1. No central or segmental pulmonary embolus. Limited  evaluation at the subsegmental level due to motion artifact. 2. Bilateral upper lobe peribronchovascular patchy airspace opacity suggestive of infection/inflammation. 3. Tiny hiatal hernia.  CT head and C-spine. CT head: 1. No evidence of an acute intracranial abnormality. 2. Paranasal sinus disease at the imaged levels, as described. 3. Small-volume fluid within the right mastoid air cells. CT cervical spine: 1. No evidence of an acute cervical spine fracture. 2. Dextrocurvature of the cervical spine. 3. 2 mm C2-C3 grade 1 retrolisthesis. 4. Cervical spondylosis as described. At C5-C6, a posterior disc osteophyte complex contributes to suspected severe spinal canal stenosis      Disposition Plan  :    Status is: Observation  DVT Prophylaxis  :    Place TED hose Start: 03/02/23 0740 enoxaparin (LOVENOX) injection 40 mg Start: 03/01/23 1600   Lab Results  Component Value Date   PLT 288 03/03/2023    Diet :  Diet Order             Diet regular Room service appropriate? Yes; Fluid consistency: Thin  Diet effective now                    Inpatient Medications  Scheduled Meds:  amoxicillin-clavulanate  1 tablet Oral Q12H   aspirin EC  81 mg Oral Daily   buPROPion  150 mg Oral q morning   cycloSPORINE  1 drop Both Eyes BID   enoxaparin (LOVENOX) injection  40 mg Subcutaneous Q24H   fluticasone  1 spray Each Nare Daily   guaiFENesin  1,200 mg Oral BID   ketotifen  2 drop Both Eyes BID   sodium chloride flush  3 mL Intravenous Q12H   traZODone  200 mg Oral QHS   Continuous Infusions:   PRN Meds:.acetaminophen **OR** acetaminophen, ipratropium-albuterol, ondansetron **OR** ondansetron (ZOFRAN) IV, oxyCODONE, polyethylene glycol   Objective:   Vitals:   03/03/23 0200 03/03/23 0400 03/03/23 0500 03/03/23 0819  BP:  137/75    Pulse: 71 71  Resp: 19 17    Temp:  97.8 F (36.6 C)    TempSrc:  Oral  Oral  SpO2: 94% 92%    Weight:   116 kg   Height:        Wt  Readings from Last 3 Encounters:  03/03/23 116 kg  02/23/23 113.4 kg  11/28/22 116.6 kg     Intake/Output Summary (Last 24 hours) at 03/03/2023 0901 Last data filed at 03/02/2023 1847 Gross per 24 hour  Intake 720 ml  Output --  Net 720 ml     Physical Exam  Awake Alert, No new F.N deficits, Normal affect [place].AT,PERRAL Supple Neck, No JVD,   Symmetrical Chest wall movement, Good air movement bilaterally, CTAB RRR,No Gallops,Rubs or new Murmurs,  +ve B.Sounds, Abd Soft, No tenderness,   No Cyanosis, Clubbing or edema        Data Review:    Recent Labs  Lab 03/01/23 0925 03/01/23 0937 03/02/23 0633 03/03/23 0325  WBC 12.1*  --  7.4 6.0  HGB 14.3 14.6 12.6 12.2  HCT 44.6 43.0 38.5 36.7  PLT 268  --  253 288  MCV 93.7  --  91.7 90.4  MCH 30.0  --  30.0 30.0  MCHC 32.1  --  32.7 33.2  RDW 12.8  --  13.0 12.8  LYMPHSABS 0.8  --  1.3 1.4  MONOABS 0.8  --  0.6 0.6  EOSABS 0.1  --  0.2 0.2  BASOSABS 0.0  --  0.0 0.0    Recent Labs  Lab 03/01/23 0925 03/01/23 0937 03/02/23 0633 03/02/23 1748 03/03/23 0325  NA 136 138 140  --  138  K 4.6 4.6 4.3  --  3.8  CL 98  --  102  --  101  CO2 29  --  31  --  28  ANIONGAP 9  --  7  --  9  GLUCOSE 153*  --  119*  --  100*  BUN 12  --  12  --  12  CREATININE 0.98  0.96  --  0.86  --  0.92  AST 19  --   --   --   --   ALT 22  --   --   --   --   ALKPHOS 49  --   --   --   --   BILITOT 0.5  --   --   --   --   ALBUMIN 3.3*  --   --   --   --   CRP  --   --   --  6.6* 4.3*  DDIMER 0.61*  --   --   --   --   PROCALCITON  --   --   --  <0.10 0.11  TSH  --   --  1.911  --   --   BNP  --   --  40.5  --  76.4  MG  --   --  2.0  --  1.9  CALCIUM 8.8*  --  8.6*  --  8.7*      Recent Labs  Lab 03/01/23 0925 03/02/23 0633 03/02/23 1748 03/03/23 0325  CRP  --   --  6.6* 4.3*  DDIMER 0.61*  --   --   --   PROCALCITON  --   --  <0.10 0.11  TSH  --  1.911  --   --   BNP  --  40.5  --  76.4  MG  --  2.0  --  1.9   CALCIUM 8.8* 8.6*  --  8.7*    --------------------------------------------------------------------------------------------------------------- Lab Results  Component Value Date   CHOL 181 10/14/2021   HDL 59 10/14/2021   LDLCALC 100 (H) 10/14/2021   TRIG 127 10/14/2021   CHOLHDL 3.1 10/14/2021    Lab Results  Component Value Date   HGBA1C 5.3 02/12/2020   Recent Labs    03/02/23 0633  TSH 1.911   No results for input(s): "VITAMINB12", "FOLATE", "FERRITIN", "TIBC", "IRON", "RETICCTPCT" in the last 72 hours. ------------------------------------------------------------------------------------------------------------------ Cardiac Enzymes No results for input(s): "CKMB", "TROPONINI", "MYOGLOBIN" in the last 168 hours.  Invalid input(s): "CK"  Micro Results Recent Results (from the past 240 hour(s))  SARS Coronavirus 2 by RT PCR (hospital order, performed in [facility] hospital lab) *cepheid single result test* Anterior Nasal Swab     Status: Abnormal   Collection Time: 03/01/23  9:02 AM   Specimen: Anterior Nasal Swab  Result Value Ref Range Status   SARS Coronavirus 2 by RT PCR POSITIVE (A) NEGATIVE Final    Comment: Performed at [facility], [address] [place], [place] [address]    Radiology Reports VAS US CAROTID  Result Date: 03/02/2023 Carotid Arterial Duplex Study Patient Name:  Emily [name]  Date of Exam:   03/02/2023 Medical Rec #: [id number]              Accession #:    [id number] Date of Birth: 1950/07/31              Patient Gender: F Patient Age:   30 years Exam Location:  [facility] Procedure:      VAS US CAROTID Referring Phys: Bess Harvest [facility] --------------------------------------------------------------------------------  Indications:       Syncope. Risk Factors:      Hypertension, coronary artery disease. Comparison Study:  No prior study Performing Technologist: Shona Simpson  Examination Guidelines: A complete evaluation includes  B-mode imaging, spectral Doppler, color Doppler, and power Doppler as needed of all accessible portions of each vessel. Bilateral testing is considered an integral part of a complete examination. Limited examinations for reoccurring indications may be performed as noted.  Right Carotid Findings: +----------+--------+--------+--------+------------------+--------+           PSV cm/sEDV cm/sStenosisPlaque DescriptionComments +----------+--------+--------+--------+------------------+--------+ CCA Prox  106     23                                         +----------+--------+--------+--------+------------------+--------+ CCA Distal83      19                                         +----------+--------+--------+--------+------------------+--------+ ICA Prox  66      17      1-39%   calcific                   +----------+--------+--------+--------+------------------+--------+ ICA Mid   63      24                                         +----------+--------+--------+--------+------------------+--------+ ICA Distal95      34                                         +----------+--------+--------+--------+------------------+--------+  ECA       109     14                                         +----------+--------+--------+--------+------------------+--------+ +----------+--------+-------+--------+-------------------+           PSV cm/sEDV cmsDescribeArm Pressure (mmHG) +----------+--------+-------+--------+-------------------+ [id number]     0                                  +----------+--------+-------+--------+-------------------+ +---------+--------+--+--------+-+ VertebralPSV cm/s41EDV cm/s9 +---------+--------+--+--------+-+  Left Carotid Findings: +----------+--------+--------+--------+------------------+--------+           PSV cm/sEDV cm/sStenosisPlaque DescriptionComments +----------+--------+--------+--------+------------------+--------+ CCA  Prox  71      12                                         +----------+--------+--------+--------+------------------+--------+ CCA Distal75      18                                         +----------+--------+--------+--------+------------------+--------+ ICA Prox  53      18      1-39%   calcific                   +----------+--------+--------+--------+------------------+--------+ ICA Mid   93      22                                         +----------+--------+--------+--------+------------------+--------+ ICA Distal57      18                                         +----------+--------+--------+--------+------------------+--------+ ECA       113     18                                         +----------+--------+--------+--------+------------------+--------+ +----------+--------+--------+--------+-------------------+           PSV cm/sEDV cm/sDescribeArm Pressure (mmHG) +----------+--------+--------+--------+-------------------+ Subclavian121     0                                   +----------+--------+--------+--------+-------------------+ +---------+--------+--+--------+--+ VertebralPSV cm/s46EDV cm/s14 +---------+--------+--+--------+--+   Summary: Right Carotid: Velocities in the right ICA are consistent with a 1-39% stenosis. Left Carotid: Velocities in the left ICA are consistent with a 1-39% stenosis. Vertebrals:  Bilateral vertebral arteries demonstrate antegrade flow. Subclavians: Normal flow hemodynamics were seen in bilateral subclavian              arteries. *See table(s) above for measurements and observations.  Electronically signed by Sherald Hess MD on 03/02/2023 at 6:28:14 PM.    Final    CT Angio Chest PE W and/or Wo Contrast  Result Date: 03/01/2023 CLINICAL DATA:  Pt got dizzy while in the  shower and then had a syncopal episode. Pt is positive for covid. Also having fatigue EXAM: CT ANGIOGRAPHY CHEST WITH CONTRAST TECHNIQUE:  Multidetector CT imaging of the chest was performed using the standard protocol during bolus administration of intravenous contrast. Multiplanar CT image reconstructions and MIPs were obtained to evaluate the vascular anatomy. RADIATION DOSE REDUCTION: This exam was performed according to the departmental dose-optimization program which includes automated exposure control, adjustment of the mA and/or kV according to patient size and/or use of iterative reconstruction technique. CONTRAST:  75mL OMNIPAQUE IOHEXOL 350 MG/ML SOLN COMPARISON:  Chest x-ray 02/28/2023 FINDINGS: Cardiovascular: Satisfactory opacification of the pulmonary arteries to the segmental level. No evidence of pulmonary embolism. Limited evaluation at subsegmental level due to motion artifact. The main pulmonary artery is normal in caliber. Normal heart size. No significant pericardial effusion. The thoracic aorta is normal in caliber. No atherosclerotic plaque of the thoracic aorta. No coronary artery calcifications. Mediastinum/Nodes: No enlarged mediastinal, hilar, or axillary lymph nodes. Thyroid gland, trachea, and esophagus demonstrate no significant findings. Tiny hiatal hernia. Lungs/Pleura: Patchy peribronchovascular bilateral upper lobe airspace opacities. No pulmonary nodule. No pulmonary mass. No pleural effusion. No pneumothorax. Upper Abdomen: No acute abnormality. Musculoskeletal: No chest wall abnormality. No suspicious lytic or blastic osseous lesions. No acute displaced fracture. Review of the MIP images confirms the above findings. IMPRESSION: 1. No central or segmental pulmonary embolus. Limited evaluation at the subsegmental level due to motion artifact. 2. Bilateral upper lobe peribronchovascular patchy airspace opacity suggestive of infection/inflammation. 3. Tiny hiatal hernia. Electronically Signed   By: Tish Frederickson M.D.   On: 03/01/2023 12:44   CT HEAD WO CONTRAST  Result Date: 03/01/2023 CLINICAL DATA:  Provided  history: Neck trauma. Facial trauma, blunt. Additional history provided: Dizziness with subsequent syncopal episode in shower earlier today, COVID positive, lethargy, confusion. EXAM: CT HEAD WITHOUT CONTRAST CT CERVICAL SPINE WITHOUT CONTRAST TECHNIQUE: Multidetector CT imaging of the head and cervical spine was performed following the standard protocol without intravenous contrast. Multiplanar CT image reconstructions of the cervical spine were also generated. RADIATION DOSE REDUCTION: This exam was performed according to the departmental dose-optimization program which includes automated exposure control, adjustment of the mA and/or kV according to patient size and/or use of iterative reconstruction technique. COMPARISON:  Head CT 03/29/2022. Brain MRI 09/13/2009. Cervical spine CT 07/17/2018. FINDINGS: CT HEAD FINDINGS Brain: No age advanced or lobar predominant parenchymal atrophy. There is no acute intracranial hemorrhage. No demarcated cortical infarct. No extra-axial fluid collection. No evidence of an intracranial mass. No midline shift. Vascular: No hyperdense vessel. Skull: No calvarial fracture or aggressive osseous lesion. Sinuses/Orbits: No mass or acute finding within the imaged orbits. Mild mucosal thickening within the bilateral maxillary sinuses at the imaged levels. Mild-to-moderate mucosal thickening within the bilateral sphenoid sinuses. Mucosal thickening within bilateral ethmoid air cells, overall moderate in severity. Mild mucosal thickening within the bilateral frontal sinuses. Other: Small-volume fluid within the right mastoid air cells. CT CERVICAL SPINE FINDINGS Alignment: Dextrocurvature of the cervical spine. 2 mm C2-C3 grade 1 retrolisthesis. Skull base and vertebrae: The basion-dental and atlanto-dental intervals are maintained.No evidence of acute fracture to the cervical spine. Soft tissues and spinal canal: No prevertebral fluid or swelling. No visible canal hematoma. Disc levels:  Cervical spondylosis with multilevel disc space narrowing, disc bulges/central disc protrusions, uncovertebral hypertrophy and facet arthrosis. Disc space narrowing is greatest at C5-C6 (moderate to advanced at this level). Multilevel spinal canal narrowing. Most notably at C5-C6, a posterior disc osteophyte complex  contributes to suspected severe spinal canal stenosis. Multilevel bony neural foraminal narrowing. Upper chest: No consolidation within the imaged lung apices. No visible pneumothorax. IMPRESSION: CT head: 1. No evidence of an acute intracranial abnormality. 2. Paranasal sinus disease at the imaged levels, as described. 3. Small-volume fluid within the right mastoid air cells. CT cervical spine: 1. No evidence of an acute cervical spine fracture. 2. Dextrocurvature of the cervical spine. 3. 2 mm C2-C3 grade 1 retrolisthesis. 4. Cervical spondylosis as described. At C5-C6, a posterior disc osteophyte complex contributes to suspected severe spinal canal stenosis. Electronically Signed   By: Jackey Loge D.O.   On: 03/01/2023 12:16   CT CERVICAL SPINE WO CONTRAST  Result Date: 03/01/2023 CLINICAL DATA:  Provided history: Neck trauma. Facial trauma, blunt. Additional history provided: Dizziness with subsequent syncopal episode in shower earlier today, COVID positive, lethargy, confusion. EXAM: CT HEAD WITHOUT CONTRAST CT CERVICAL SPINE WITHOUT CONTRAST TECHNIQUE: Multidetector CT imaging of the head and cervical spine was performed following the standard protocol without intravenous contrast. Multiplanar CT image reconstructions of the cervical spine were also generated. RADIATION DOSE REDUCTION: This exam was performed according to the departmental dose-optimization program which includes automated exposure control, adjustment of the mA and/or kV according to patient size and/or use of iterative reconstruction technique. COMPARISON:  Head CT 03/29/2022. Brain MRI 09/13/2009. Cervical spine CT  07/17/2018. FINDINGS: CT HEAD FINDINGS Brain: No age advanced or lobar predominant parenchymal atrophy. There is no acute intracranial hemorrhage. No demarcated cortical infarct. No extra-axial fluid collection. No evidence of an intracranial mass. No midline shift. Vascular: No hyperdense vessel. Skull: No calvarial fracture or aggressive osseous lesion. Sinuses/Orbits: No mass or acute finding within the imaged orbits. Mild mucosal thickening within the bilateral maxillary sinuses at the imaged levels. Mild-to-moderate mucosal thickening within the bilateral sphenoid sinuses. Mucosal thickening within bilateral ethmoid air cells, overall moderate in severity. Mild mucosal thickening within the bilateral frontal sinuses. Other: Small-volume fluid within the right mastoid air cells. CT CERVICAL SPINE FINDINGS Alignment: Dextrocurvature of the cervical spine. 2 mm C2-C3 grade 1 retrolisthesis. Skull base and vertebrae: The basion-dental and atlanto-dental intervals are maintained.No evidence of acute fracture to the cervical spine. Soft tissues and spinal canal: No prevertebral fluid or swelling. No visible canal hematoma. Disc levels: Cervical spondylosis with multilevel disc space narrowing, disc bulges/central disc protrusions, uncovertebral hypertrophy and facet arthrosis. Disc space narrowing is greatest at C5-C6 (moderate to advanced at this level). Multilevel spinal canal narrowing. Most notably at C5-C6, a posterior disc osteophyte complex contributes to suspected severe spinal canal stenosis. Multilevel bony neural foraminal narrowing. Upper chest: No consolidation within the imaged lung apices. No visible pneumothorax. IMPRESSION: CT head: 1. No evidence of an acute intracranial abnormality. 2. Paranasal sinus disease at the imaged levels, as described. 3. Small-volume fluid within the right mastoid air cells. CT cervical spine: 1. No evidence of an acute cervical spine fracture. 2. Dextrocurvature of the  cervical spine. 3. 2 mm C2-C3 grade 1 retrolisthesis. 4. Cervical spondylosis as described. At C5-C6, a posterior disc osteophyte complex contributes to suspected severe spinal canal stenosis. Electronically Signed   By: Jackey Loge D.O.   On: 03/01/2023 12:16   DG Chest Port 1 View  Result Date: 03/01/2023 CLINICAL DATA:  Shortness of breath and hypoxia EXAM: PORTABLE CHEST 1 VIEW COMPARISON:  08/09/2018 FINDINGS: The heart size and mediastinal contours are within normal limits. Underinflation. Slight elevation of the right hemidiaphragm. No consolidation, pneumothorax or effusion. No edema.  Overlapping cardiac leads. The visualized skeletal structures are unremarkable. IMPRESSION: Underinflation. Elevated right hemidiaphragm. No acute cardiopulmonary disease Electronically Signed   By: Tomeka Kays M.D.   On: 03/01/2023 10:25      Signature  -   Susa Raring M.D on 03/03/2023 at 9:01 AM   -  To page go to [email or web address]

## 2023-03-04 DIAGNOSIS — R55 Syncope and collapse: Secondary | ICD-10-CM | POA: Diagnosis not present

## 2023-03-04 LAB — CBC WITH DIFFERENTIAL/PLATELET
Abs Immature Granulocytes: 0.17 10*3/uL — ABNORMAL HIGH (ref 0.00–0.07)
Basophils Absolute: 0 10*3/uL (ref 0.0–0.1)
Basophils Relative: 1 %
Eosinophils Absolute: 0.2 10*3/uL (ref 0.0–0.5)
Eosinophils Relative: 3 %
HCT: 37.2 % (ref 36.0–46.0)
Hemoglobin: 12.8 g/dL (ref 12.0–15.0)
Immature Granulocytes: 3 %
Lymphocytes Relative: 23 %
Lymphs Abs: 1.2 10*3/uL (ref 0.7–4.0)
MCH: 31.3 pg (ref 26.0–34.0)
MCHC: 34.4 g/dL (ref 30.0–36.0)
MCV: 91 fL (ref 80.0–100.0)
Monocytes Absolute: 0.4 10*3/uL (ref 0.1–1.0)
Monocytes Relative: 8 %
Neutro Abs: 3.1 10*3/uL (ref 1.7–7.7)
Neutrophils Relative %: 62 %
Platelets: 292 10*3/uL (ref 150–400)
RBC: 4.09 MIL/uL (ref 3.87–5.11)
RDW: 13 % (ref 11.5–15.5)
WBC: 5.1 10*3/uL (ref 4.0–10.5)
nRBC: 0 % (ref 0.0–0.2)

## 2023-03-04 LAB — BASIC METABOLIC PANEL
Anion gap: 8 (ref 5–15)
BUN: 9 mg/dL (ref 8–23)
CO2: 25 mmol/L (ref 22–32)
Calcium: 8.7 mg/dL — ABNORMAL LOW (ref 8.9–10.3)
Chloride: 105 mmol/L (ref 98–111)
Creatinine, Ser: 0.81 mg/dL (ref 0.44–1.00)
GFR, Estimated: >60 mL/min (ref >60)
Glucose, Bld: 145 mg/dL — ABNORMAL HIGH (ref 70–99)
Potassium: 3.8 mmol/L (ref 3.5–5.1)
Sodium: 138 mmol/L (ref 135–145)

## 2023-03-04 LAB — PROCALCITONIN: Procalcitonin: <0.1 ng/mL

## 2023-03-04 LAB — GLUCOSE, CAPILLARY
Glucose-Capillary: 125 mg/dL — ABNORMAL HIGH (ref 70–99)
Glucose-Capillary: 107 mg/dL — ABNORMAL HIGH (ref 70–99)

## 2023-03-04 LAB — C-REACTIVE PROTEIN: CRP: 1.2 mg/dL — ABNORMAL HIGH (ref <1.0)

## 2023-03-04 LAB — MAGNESIUM: Magnesium: 1.7 mg/dL (ref 1.7–2.4)

## 2023-03-04 LAB — BRAIN NATRIURETIC PEPTIDE: B Natriuretic Peptide: 101.3 pg/mL — ABNORMAL HIGH (ref 0.0–100.0)

## 2023-03-04 NOTE — Discharge Instructions (Addendum)
Follow with Primary MD Emily [name], Emily Amabile, NP in 7 days   Get CBC, CMP, 2 view Chest X ray -  checked next visit with your primary MD   Activity: As tolerated with Full fall precautions use walker/cane & assistance as needed  Disposition Home    Diet: Heart Healthy    Special Instructions: If you have smoked or chewed Tobacco  in the last 2 yrs please stop smoking, stop any regular Alcohol  and or any Recreational drug use.  On your next visit with your primary care physician please Get Medicines reviewed and adjusted.  Please request your Prim.MD to go over all Hospital Tests and Procedure/Radiological results at the follow up, please get all Hospital records sent to your Prim MD by signing hospital release before you go home.  If you experience worsening of your admission symptoms, develop shortness of breath, life threatening emergency, suicidal or homicidal thoughts you must seek medical attention immediately by calling 911 or calling your MD immediately  if symptoms less severe.  You Must read complete instructions/literature along with all the possible adverse reactions/side effects for all the Medicines you take and that have been prescribed to you. Take any new Medicines after you have completely understood and accpet all the possible adverse reactions/side effects.   Do not drive when taking Pain medications.  Do not take more than prescribed Pain, Sleep and Anxiety Medications

## 2023-03-04 NOTE — Discharge Summary (Signed)
known as: Wellbutrin XL Take 1 tablet (150 mg total) by mouth every morning.   chlorthalidone 25 MG tablet Commonly known as: HYGROTON Take 1 tablet (25 mg total) by mouth daily.   fluticasone 50 MCG/ACT nasal spray Commonly known as: FLONASE Place 1 spray into both nostrils daily.   guaiFENesin 600 MG 12 hr tablet Commonly known as: MUCINEX Take 600 mg by mouth 2 (two) times daily.   meclizine 25 MG tablet Commonly known as: ANTIVERT Take 1 tablet (25 mg total) by mouth 3 (three) times daily as needed for dizziness.   Omron 3 Series BP Monitor Devi Use as directed   ondansetron 4 MG tablet Commonly known as: ZOFRAN Take 1 tablet (4 mg total) by mouth every 6 (six) to 8 (eight) hours as needed.   Rocklatan 0.02-0.005 % Soln Generic drug: Netarsudil-Latanoprost INSTILL 1 DROP INTO EACH EYE AT BEDTIME   traZODone 100 MG tablet Commonly known as: DESYREL Take 3 tablets (300 mg total) by mouth at bedtime.   Vitamin D3 1.25 MG (50000 UT) Caps Take 1 capsule (50,000 Units total) by mouth once a week.         Follow-up Information     Early, Sung Amabile, NP. Schedule an appointment as soon as possible for a visit in 1 week(s).   Specialty: Nurse Practitioner Contact information: [address] [place] [address] [phone]                 Major procedures and Radiology Reports - PLEASE review detailed and final reports thoroughly  -      VAS US CAROTID  Result Date: 03/02/2023 Carotid Arterial Duplex Study Patient Name:  Emily [name]  Date of Exam:   03/02/2023 Medical Rec #: [id number]              Accession #:    [id number] Date of Birth: July 14, 1950               Patient Gender: F Patient Age:   72 years Exam Location:  [facility] Procedure:      VAS US CAROTID Referring Phys: [facility] --------------------------------------------------------------------------------  Indications:       Syncope. Risk Factors:      Hypertension, coronary artery disease. Comparison Study:  No prior study Performing Technologist: Shona Simpson  Examination Guidelines: A complete evaluation includes B-mode imaging, spectral Doppler, color Doppler, and power Doppler as needed of all accessible portions of each vessel. Bilateral testing is considered an integral part of a complete examination. Limited examinations for reoccurring indications may be performed as noted.  Right Carotid Findings: +----------+--------+--------+--------+------------------+--------+           PSV cm/sEDV cm/sStenosisPlaque DescriptionComments +----------+--------+--------+--------+------------------+--------+ CCA Prox  106     23                                         +----------+--------+--------+--------+------------------+--------+ CCA Distal83      19                                         +----------+--------+--------+--------+------------------+--------+ ICA Prox  66      17      1-39%   calcific                   +----------+--------+--------+--------+------------------+--------+  known as: Wellbutrin XL Take 1 tablet (150 mg total) by mouth every morning.   chlorthalidone 25 MG tablet Commonly known as: HYGROTON Take 1 tablet (25 mg total) by mouth daily.   fluticasone 50 MCG/ACT nasal spray Commonly known as: FLONASE Place 1 spray into both nostrils daily.   guaiFENesin 600 MG 12 hr tablet Commonly known as: MUCINEX Take 600 mg by mouth 2 (two) times daily.   meclizine 25 MG tablet Commonly known as: ANTIVERT Take 1 tablet (25 mg total) by mouth 3 (three) times daily as needed for dizziness.   Omron 3 Series BP Monitor Devi Use as directed   ondansetron 4 MG tablet Commonly known as: ZOFRAN Take 1 tablet (4 mg total) by mouth every 6 (six) to 8 (eight) hours as needed.   Rocklatan 0.02-0.005 % Soln Generic drug: Netarsudil-Latanoprost INSTILL 1 DROP INTO EACH EYE AT BEDTIME   traZODone 100 MG tablet Commonly known as: DESYREL Take 3 tablets (300 mg total) by mouth at bedtime.   Vitamin D3 1.25 MG (50000 UT) Caps Take 1 capsule (50,000 Units total) by mouth once a week.         Follow-up Information     Early, Sung Amabile, NP. Schedule an appointment as soon as possible for a visit in 1 week(s).   Specialty: Nurse Practitioner Contact information: [address] [place] [address] [phone]                 Major procedures and Radiology Reports - PLEASE review detailed and final reports thoroughly  -      VAS US CAROTID  Result Date: 03/02/2023 Carotid Arterial Duplex Study Patient Name:  Emily [name]  Date of Exam:   03/02/2023 Medical Rec #: [id number]              Accession #:    [id number] Date of Birth: July 14, 1950               Patient Gender: F Patient Age:   72 years Exam Location:  [facility] Procedure:      VAS US CAROTID Referring Phys: [facility] --------------------------------------------------------------------------------  Indications:       Syncope. Risk Factors:      Hypertension, coronary artery disease. Comparison Study:  No prior study Performing Technologist: Shona Simpson  Examination Guidelines: A complete evaluation includes B-mode imaging, spectral Doppler, color Doppler, and power Doppler as needed of all accessible portions of each vessel. Bilateral testing is considered an integral part of a complete examination. Limited examinations for reoccurring indications may be performed as noted.  Right Carotid Findings: +----------+--------+--------+--------+------------------+--------+           PSV cm/sEDV cm/sStenosisPlaque DescriptionComments +----------+--------+--------+--------+------------------+--------+ CCA Prox  106     23                                         +----------+--------+--------+--------+------------------+--------+ CCA Distal83      19                                         +----------+--------+--------+--------+------------------+--------+ ICA Prox  66      17      1-39%   calcific                   +----------+--------+--------+--------+------------------+--------+  [name] ONG:[id number] DOB: 03-Apr-1951 DOA: 03/01/2023  PCP: Tollie Eth, NP  Admit date: 03/01/2023  Discharge date: 03/04/2023  Admitted From: Home   Disposition:  Home   Recommendations for Outpatient Follow-up:   Follow up with PCP in 1-2 weeks  PCP Please obtain BMP/CBC, 2 view CXR in 1week,  (see Discharge instructions)   PCP Please follow up on the following pending results: View CT scan findings, if any sinus issues persist outpatient ENT follow-up.   Home Health: None   Equipment/Devices: None  Consultations: None  Discharge Condition: Stable    CODE STATUS: Full    Diet Recommendation: Heart Healthy     Chief Complaint  Patient presents with   Fatigue   Fall     Brief history of present illness from the day of admission and additional interim summary    72 y.o. female w/ PMH vertigo on meclizine prn, anxiety/depression on home Xanax Wellbutrin Trazodone, history asthma/COPD on home albuterol prn and Trelegy, sinus/allergies on Flonase, HTN on chlorthalidone, DM2 on home farxiga, takes ASA. He was recently diagnosed with COVID-19 infection on 02/23/2023, was started on Paxlovid on the same day. Since then has been not eating or drinking well feeling weak all over, went to the bathroom yesterday and when getting up off the commode she fell on the floor, felt very weak, did not lose consciousness, no seizure-like activity. No incontinence. Brought to the ER and admitted for syncopal workup.                                                                  Hospital Course   Syncope and collapse.  No loss of consciousness.  She was not eating or drinking well for the last several days due to COVID-19 infection, clinically dehydrated, initiate IV fluids, TED stockings, PT OT, still feels weak  all over.  CTA lungs, CT head and C-spine nonacute, recent echocardiogram from few months ago noted and stable.  Advance activity, PT OT, carotid ultrasound stable, however after hydration with IV fluids orthostatics has resolved, she did well with PT OT, now at her baseline.  Discharge home.  PCP to monitor blood pressure, orthostatics and BMP.   Recent COVID-19 infection.  Finished Paxlovid treatment.  Still has a dry cough, she did have hypoxia at the time of admission, hence will monitor inflammatory markers, nebulizer treatments, encouraged to sit in chair use I-S and flutter valve for pulmonary toiletry.  Now on room air and symptom-free.  Discharge home.   History of asthma and COPD.  See above   Hypertension.  On Farxiga and chlorthalidone.  Continue home meds.   History of glaucoma.  Continue home eyedrops of supportive care.  Possible chronic sinusitis on CT scan.  Request PCP to kindly review CT  known as: Wellbutrin XL Take 1 tablet (150 mg total) by mouth every morning.   chlorthalidone 25 MG tablet Commonly known as: HYGROTON Take 1 tablet (25 mg total) by mouth daily.   fluticasone 50 MCG/ACT nasal spray Commonly known as: FLONASE Place 1 spray into both nostrils daily.   guaiFENesin 600 MG 12 hr tablet Commonly known as: MUCINEX Take 600 mg by mouth 2 (two) times daily.   meclizine 25 MG tablet Commonly known as: ANTIVERT Take 1 tablet (25 mg total) by mouth 3 (three) times daily as needed for dizziness.   Omron 3 Series BP Monitor Devi Use as directed   ondansetron 4 MG tablet Commonly known as: ZOFRAN Take 1 tablet (4 mg total) by mouth every 6 (six) to 8 (eight) hours as needed.   Rocklatan 0.02-0.005 % Soln Generic drug: Netarsudil-Latanoprost INSTILL 1 DROP INTO EACH EYE AT BEDTIME   traZODone 100 MG tablet Commonly known as: DESYREL Take 3 tablets (300 mg total) by mouth at bedtime.   Vitamin D3 1.25 MG (50000 UT) Caps Take 1 capsule (50,000 Units total) by mouth once a week.         Follow-up Information     Early, Sung Amabile, NP. Schedule an appointment as soon as possible for a visit in 1 week(s).   Specialty: Nurse Practitioner Contact information: [address] [place] [address] [phone]                 Major procedures and Radiology Reports - PLEASE review detailed and final reports thoroughly  -      VAS US CAROTID  Result Date: 03/02/2023 Carotid Arterial Duplex Study Patient Name:  Emily [name]  Date of Exam:   03/02/2023 Medical Rec #: [id number]              Accession #:    [id number] Date of Birth: July 14, 1950               Patient Gender: F Patient Age:   72 years Exam Location:  [facility] Procedure:      VAS US CAROTID Referring Phys: [facility] --------------------------------------------------------------------------------  Indications:       Syncope. Risk Factors:      Hypertension, coronary artery disease. Comparison Study:  No prior study Performing Technologist: Shona Simpson  Examination Guidelines: A complete evaluation includes B-mode imaging, spectral Doppler, color Doppler, and power Doppler as needed of all accessible portions of each vessel. Bilateral testing is considered an integral part of a complete examination. Limited examinations for reoccurring indications may be performed as noted.  Right Carotid Findings: +----------+--------+--------+--------+------------------+--------+           PSV cm/sEDV cm/sStenosisPlaque DescriptionComments +----------+--------+--------+--------+------------------+--------+ CCA Prox  106     23                                         +----------+--------+--------+--------+------------------+--------+ CCA Distal83      19                                         +----------+--------+--------+--------+------------------+--------+ ICA Prox  66      17      1-39%   calcific                   +----------+--------+--------+--------+------------------+--------+  known as: Wellbutrin XL Take 1 tablet (150 mg total) by mouth every morning.   chlorthalidone 25 MG tablet Commonly known as: HYGROTON Take 1 tablet (25 mg total) by mouth daily.   fluticasone 50 MCG/ACT nasal spray Commonly known as: FLONASE Place 1 spray into both nostrils daily.   guaiFENesin 600 MG 12 hr tablet Commonly known as: MUCINEX Take 600 mg by mouth 2 (two) times daily.   meclizine 25 MG tablet Commonly known as: ANTIVERT Take 1 tablet (25 mg total) by mouth 3 (three) times daily as needed for dizziness.   Omron 3 Series BP Monitor Devi Use as directed   ondansetron 4 MG tablet Commonly known as: ZOFRAN Take 1 tablet (4 mg total) by mouth every 6 (six) to 8 (eight) hours as needed.   Rocklatan 0.02-0.005 % Soln Generic drug: Netarsudil-Latanoprost INSTILL 1 DROP INTO EACH EYE AT BEDTIME   traZODone 100 MG tablet Commonly known as: DESYREL Take 3 tablets (300 mg total) by mouth at bedtime.   Vitamin D3 1.25 MG (50000 UT) Caps Take 1 capsule (50,000 Units total) by mouth once a week.         Follow-up Information     Early, Sung Amabile, NP. Schedule an appointment as soon as possible for a visit in 1 week(s).   Specialty: Nurse Practitioner Contact information: [address] [place] [address] [phone]                 Major procedures and Radiology Reports - PLEASE review detailed and final reports thoroughly  -      VAS US CAROTID  Result Date: 03/02/2023 Carotid Arterial Duplex Study Patient Name:  Emily [name]  Date of Exam:   03/02/2023 Medical Rec #: [id number]              Accession #:    [id number] Date of Birth: July 14, 1950               Patient Gender: F Patient Age:   72 years Exam Location:  [facility] Procedure:      VAS US CAROTID Referring Phys: [facility] --------------------------------------------------------------------------------  Indications:       Syncope. Risk Factors:      Hypertension, coronary artery disease. Comparison Study:  No prior study Performing Technologist: Shona Simpson  Examination Guidelines: A complete evaluation includes B-mode imaging, spectral Doppler, color Doppler, and power Doppler as needed of all accessible portions of each vessel. Bilateral testing is considered an integral part of a complete examination. Limited examinations for reoccurring indications may be performed as noted.  Right Carotid Findings: +----------+--------+--------+--------+------------------+--------+           PSV cm/sEDV cm/sStenosisPlaque DescriptionComments +----------+--------+--------+--------+------------------+--------+ CCA Prox  106     23                                         +----------+--------+--------+--------+------------------+--------+ CCA Distal83      19                                         +----------+--------+--------+--------+------------------+--------+ ICA Prox  66      17      1-39%   calcific                   +----------+--------+--------+--------+------------------+--------+  known as: Wellbutrin XL Take 1 tablet (150 mg total) by mouth every morning.   chlorthalidone 25 MG tablet Commonly known as: HYGROTON Take 1 tablet (25 mg total) by mouth daily.   fluticasone 50 MCG/ACT nasal spray Commonly known as: FLONASE Place 1 spray into both nostrils daily.   guaiFENesin 600 MG 12 hr tablet Commonly known as: MUCINEX Take 600 mg by mouth 2 (two) times daily.   meclizine 25 MG tablet Commonly known as: ANTIVERT Take 1 tablet (25 mg total) by mouth 3 (three) times daily as needed for dizziness.   Omron 3 Series BP Monitor Devi Use as directed   ondansetron 4 MG tablet Commonly known as: ZOFRAN Take 1 tablet (4 mg total) by mouth every 6 (six) to 8 (eight) hours as needed.   Rocklatan 0.02-0.005 % Soln Generic drug: Netarsudil-Latanoprost INSTILL 1 DROP INTO EACH EYE AT BEDTIME   traZODone 100 MG tablet Commonly known as: DESYREL Take 3 tablets (300 mg total) by mouth at bedtime.   Vitamin D3 1.25 MG (50000 UT) Caps Take 1 capsule (50,000 Units total) by mouth once a week.         Follow-up Information     Early, Sung Amabile, NP. Schedule an appointment as soon as possible for a visit in 1 week(s).   Specialty: Nurse Practitioner Contact information: [address] [place] [address] [phone]                 Major procedures and Radiology Reports - PLEASE review detailed and final reports thoroughly  -      VAS US CAROTID  Result Date: 03/02/2023 Carotid Arterial Duplex Study Patient Name:  Emily [name]  Date of Exam:   03/02/2023 Medical Rec #: [id number]              Accession #:    [id number] Date of Birth: July 14, 1950               Patient Gender: F Patient Age:   72 years Exam Location:  [facility] Procedure:      VAS US CAROTID Referring Phys: [facility] --------------------------------------------------------------------------------  Indications:       Syncope. Risk Factors:      Hypertension, coronary artery disease. Comparison Study:  No prior study Performing Technologist: Shona Simpson  Examination Guidelines: A complete evaluation includes B-mode imaging, spectral Doppler, color Doppler, and power Doppler as needed of all accessible portions of each vessel. Bilateral testing is considered an integral part of a complete examination. Limited examinations for reoccurring indications may be performed as noted.  Right Carotid Findings: +----------+--------+--------+--------+------------------+--------+           PSV cm/sEDV cm/sStenosisPlaque DescriptionComments +----------+--------+--------+--------+------------------+--------+ CCA Prox  106     23                                         +----------+--------+--------+--------+------------------+--------+ CCA Distal83      19                                         +----------+--------+--------+--------+------------------+--------+ ICA Prox  66      17      1-39%   calcific                   +----------+--------+--------+--------+------------------+--------+  known as: Wellbutrin XL Take 1 tablet (150 mg total) by mouth every morning.   chlorthalidone 25 MG tablet Commonly known as: HYGROTON Take 1 tablet (25 mg total) by mouth daily.   fluticasone 50 MCG/ACT nasal spray Commonly known as: FLONASE Place 1 spray into both nostrils daily.   guaiFENesin 600 MG 12 hr tablet Commonly known as: MUCINEX Take 600 mg by mouth 2 (two) times daily.   meclizine 25 MG tablet Commonly known as: ANTIVERT Take 1 tablet (25 mg total) by mouth 3 (three) times daily as needed for dizziness.   Omron 3 Series BP Monitor Devi Use as directed   ondansetron 4 MG tablet Commonly known as: ZOFRAN Take 1 tablet (4 mg total) by mouth every 6 (six) to 8 (eight) hours as needed.   Rocklatan 0.02-0.005 % Soln Generic drug: Netarsudil-Latanoprost INSTILL 1 DROP INTO EACH EYE AT BEDTIME   traZODone 100 MG tablet Commonly known as: DESYREL Take 3 tablets (300 mg total) by mouth at bedtime.   Vitamin D3 1.25 MG (50000 UT) Caps Take 1 capsule (50,000 Units total) by mouth once a week.         Follow-up Information     Early, Sung Amabile, NP. Schedule an appointment as soon as possible for a visit in 1 week(s).   Specialty: Nurse Practitioner Contact information: [address] [place] [address] [phone]                 Major procedures and Radiology Reports - PLEASE review detailed and final reports thoroughly  -      VAS US CAROTID  Result Date: 03/02/2023 Carotid Arterial Duplex Study Patient Name:  Emily [name]  Date of Exam:   03/02/2023 Medical Rec #: [id number]              Accession #:    [id number] Date of Birth: July 14, 1950               Patient Gender: F Patient Age:   72 years Exam Location:  [facility] Procedure:      VAS US CAROTID Referring Phys: [facility] --------------------------------------------------------------------------------  Indications:       Syncope. Risk Factors:      Hypertension, coronary artery disease. Comparison Study:  No prior study Performing Technologist: Shona Simpson  Examination Guidelines: A complete evaluation includes B-mode imaging, spectral Doppler, color Doppler, and power Doppler as needed of all accessible portions of each vessel. Bilateral testing is considered an integral part of a complete examination. Limited examinations for reoccurring indications may be performed as noted.  Right Carotid Findings: +----------+--------+--------+--------+------------------+--------+           PSV cm/sEDV cm/sStenosisPlaque DescriptionComments +----------+--------+--------+--------+------------------+--------+ CCA Prox  106     23                                         +----------+--------+--------+--------+------------------+--------+ CCA Distal83      19                                         +----------+--------+--------+--------+------------------+--------+ ICA Prox  66      17      1-39%   calcific                   +----------+--------+--------+--------+------------------+--------+  known as: Wellbutrin XL Take 1 tablet (150 mg total) by mouth every morning.   chlorthalidone 25 MG tablet Commonly known as: HYGROTON Take 1 tablet (25 mg total) by mouth daily.   fluticasone 50 MCG/ACT nasal spray Commonly known as: FLONASE Place 1 spray into both nostrils daily.   guaiFENesin 600 MG 12 hr tablet Commonly known as: MUCINEX Take 600 mg by mouth 2 (two) times daily.   meclizine 25 MG tablet Commonly known as: ANTIVERT Take 1 tablet (25 mg total) by mouth 3 (three) times daily as needed for dizziness.   Omron 3 Series BP Monitor Devi Use as directed   ondansetron 4 MG tablet Commonly known as: ZOFRAN Take 1 tablet (4 mg total) by mouth every 6 (six) to 8 (eight) hours as needed.   Rocklatan 0.02-0.005 % Soln Generic drug: Netarsudil-Latanoprost INSTILL 1 DROP INTO EACH EYE AT BEDTIME   traZODone 100 MG tablet Commonly known as: DESYREL Take 3 tablets (300 mg total) by mouth at bedtime.   Vitamin D3 1.25 MG (50000 UT) Caps Take 1 capsule (50,000 Units total) by mouth once a week.         Follow-up Information     Early, Sung Amabile, NP. Schedule an appointment as soon as possible for a visit in 1 week(s).   Specialty: Nurse Practitioner Contact information: [address] [place] [address] [phone]                 Major procedures and Radiology Reports - PLEASE review detailed and final reports thoroughly  -      VAS US CAROTID  Result Date: 03/02/2023 Carotid Arterial Duplex Study Patient Name:  Emily [name]  Date of Exam:   03/02/2023 Medical Rec #: [id number]              Accession #:    [id number] Date of Birth: July 14, 1950               Patient Gender: F Patient Age:   72 years Exam Location:  [facility] Procedure:      VAS US CAROTID Referring Phys: [facility] --------------------------------------------------------------------------------  Indications:       Syncope. Risk Factors:      Hypertension, coronary artery disease. Comparison Study:  No prior study Performing Technologist: Shona Simpson  Examination Guidelines: A complete evaluation includes B-mode imaging, spectral Doppler, color Doppler, and power Doppler as needed of all accessible portions of each vessel. Bilateral testing is considered an integral part of a complete examination. Limited examinations for reoccurring indications may be performed as noted.  Right Carotid Findings: +----------+--------+--------+--------+------------------+--------+           PSV cm/sEDV cm/sStenosisPlaque DescriptionComments +----------+--------+--------+--------+------------------+--------+ CCA Prox  106     23                                         +----------+--------+--------+--------+------------------+--------+ CCA Distal83      19                                         +----------+--------+--------+--------+------------------+--------+ ICA Prox  66      17      1-39%   calcific                   +----------+--------+--------+--------+------------------+--------+

## 2023-03-05 ENCOUNTER — Telehealth: Payer: Self-pay

## 2023-03-05 NOTE — Transitions of Care (Post Inpatient/ED Visit) (Signed)
03/05/2023  Name: Emily [name] MRN: [id number] DOB: [birth year]  Today's TOC FU Call Status: Today's TOC FU Call Status:: Successful TOC FU Call Completed TOC FU Call Complete Date: 03/05/23 Patient's Name and Date of Birth confirmed.  Transition Care Management Follow-up Telephone Call Date of Discharge: 03/04/23 Type of Discharge: Inpatient Admission Primary Inpatient Discharge Diagnosis:: 'COVID-19 infection" How have you been since you were released from the hospital?: Better (Pt voices she rested well last night-coughing is improving. Appetite fair. She has been having diarrhea since coming home.) Any questions or concerns?: Yes Patient Questions/Concerns:: Pt wanting to know what she can take for diarrhea that won't interfere with current meds. States she is eating & drinking plenty of fluids to stay hydrated Patient Questions/Concerns Addressed: Notified Provider of Patient Questions/Concerns, Other: (Pt will call office herself to discuss what to take for diarrhea as well as need for f/u appt with labs and repeat chest x-ray.)  Items Reviewed: Did you receive and understand the discharge instructions provided?: Yes Medications obtained,verified, and reconciled?: Yes (Medications Reviewed) Any new allergies since your discharge?: No Dietary orders reviewed?: Yes Type of Diet Ordered:: low salt/heart healthy Do you have support at home?: Yes People in Home: spouse Name of Support/Comfort Primary Source: Leonette Most  Medications Reviewed Today: Medications Reviewed Today     Reviewed by Charlyn Minerva, RN (Registered Nurse) on 03/05/23 at 1451  Med List Status: <None>   Medication Order Taking? Sig Documenting Provider Last Dose Status Informant  albuterol (VENTOLIN HFA) 108 (90 Base) MCG/ACT inhaler [id number] Yes Inhale 2 puffs into the lungs every 6 (six) hours as needed for wheezing or shortness of breath. Tollie Eth, NP Taking Active Self            Med Note Laray Anger Mar 01, 2023  9:52 AM)    ALPRAZolam Prudy Feeler) 1 MG tablet [id number] Yes Take 0.5-1 tablets (0.5-1 mg total) by mouth daily as needed. This is a 90-day supply  Taking Active Self  aspirin EC 81 MG tablet [id number] Yes Take 81 mg by mouth daily. Swallow whole. [provider] Taking Active Self  azelastine (OPTIVAR) 0.05 % ophthalmic solution [id number] Yes Place 2 drops into both eyes 2 (two) times daily. Tollie Eth, NP Taking Active Self  Blood Pressure Monitor MISC [id number]  Use as directed Early, Sung Amabile, NP  Active Self  buPROPion (WELLBUTRIN XL) 150 MG 24 hr tablet [id number] Yes Take 1 tablet (150 mg total) by mouth every morning.  Taking Active Self  chlorthalidone (HYGROTON) 25 MG tablet [id number] Yes Take 1 tablet (25 mg total) by mouth daily. Tollie Eth, NP Taking Active Self           Med Note Laural Benes, SABRINA A   Fri Feb 23, 2023 10:53 AM)    Cholecalciferol (VITAMIN D3) 1.25 MG (50000 UT) capsule [id number] Yes Take 1 capsule (50,000 Units total) by mouth once a week.  Taking Active Self           Med Note (CARD, AMY L   Thu Mar 01, 2023  5:19 PM) Patient takes on Sunday.  fluticasone (FLONASE) 50 MCG/ACT nasal spray [id number] Yes Place 1 spray into both nostrils daily. Omar Person, MD Taking Active Self  guaiFENesin (MUCINEX) 600 MG 12 hr tablet [id number] Yes Take 600 mg by mouth 2 (two) times daily. [provider] Taking Active Self           Med  Note Laray Anger Mar 01, 2023  9:52 AM)    meclizine (ANTIVERT) 25 MG tablet [id number] Yes Take 1 tablet (25 mg total) by mouth 3 (three) times daily as needed for dizziness. Tollie Eth, NP Taking Active Self  Netarsudil-Latanoprost (ROCKLATAN) 0.02-0.005 % SOLN [id number] Yes INSTILL 1 DROP INTO EACH EYE AT BEDTIME  Taking Active Self  ondansetron (ZOFRAN) 4 MG tablet [id number] Yes Take 1 tablet (4 mg total) by mouth every 6 (six) to 8 (eight) hours as needed.  Tollie Eth, NP Taking Active Self           Med Note Laray Anger Mar 01, 2023  9:52 AM)    traZODone (DESYREL) 100 MG tablet [id number] Yes Take 3 tablets (300 mg total) by mouth at bedtime.  Taking Active Self            Home Care and Equipment/Supplies: Were Home Health Services Ordered?: NA Any new equipment or medical supplies ordered?: NA  Functional Questionnaire: Do you need assistance with bathing/showering or dressing?: No Do you need assistance with meal preparation?: No Do you need assistance with eating?: No Do you have difficulty maintaining continence: No Do you need assistance with getting out of bed/getting out of a chair/moving?: No Do you have difficulty managing or taking your medications?: No  Follow up appointments reviewed: PCP Follow-up appointment confirmed?: No (care guide attempted to make PCP appt within time frame per d/c instructions of 1wk-no openings until 10/8-pt will call office to arrange a sooner appt) MD Provider Line Number:[phone] Given: No Specialist Hospital Follow-up appointment confirmed?: NA Do you need transportation to your follow-up appointment?: No (pt confirms spouse able to get her to appts) Do you understand care options if your condition(s) worsen?: Yes-patient verbalized understanding  SDOH Interventions Today    Flowsheet Row Most Recent Value  SDOH Interventions   Food Insecurity Interventions Intervention Not Indicated  Transportation Interventions Intervention Not Indicated      TOC Interventions Today    Flowsheet Row Most Recent Value  TOC Interventions   TOC Interventions Discussed/Reviewed TOC Interventions Discussed      Interventions Today    Flowsheet Row Most Recent Value  General Interventions   General Interventions Discussed/Reviewed General Interventions Discussed, Doctor Visits, Vaccines, Durable Medical Equipment (DME)  Vaccines COVID-19  Doctor Visits Discussed/Reviewed  PCP, Doctor Visits Discussed  Durable Medical Equipment (DME) BP Cuff  [pt has BP machine in the home but has not opened it and used it-no dizziness]  PCP/Specialist Visits Compliance with follow-up visit  Education Interventions   Education Provided Provided Education  Provided Verbal Education On Nutrition, When to see the doctor, Medication, Other  [sx mgmt-resp & GI]  Nutrition Interventions   Nutrition Discussed/Reviewed Nutrition Discussed, Adding fruits and vegetables, Increasing proteins, Decreasing fats, Fluid intake, Decreasing salt  Pharmacy Interventions   Pharmacy Dicussed/Reviewed Pharmacy Topics Discussed, Medications and their functions  Safety Interventions   Safety Discussed/Reviewed Safety Discussed, Home Safety       Alessandra Grout [facility] Care Management Care Management Community Coordinator Direct Phone: [phone] Toll Free: [phone] Fax: [phone]

## 2023-03-07 ENCOUNTER — Inpatient Hospital Stay: Payer: PPO | Admitting: Medical

## 2023-03-08 ENCOUNTER — Ambulatory Visit (INDEPENDENT_AMBULATORY_CARE_PROVIDER_SITE_OTHER): Payer: PPO | Admitting: Nurse Practitioner

## 2023-03-08 ENCOUNTER — Encounter: Payer: Self-pay | Admitting: Nurse Practitioner

## 2023-03-08 VITALS — BP 124/78 | HR 64 | Wt 251.8 lb

## 2023-03-08 DIAGNOSIS — R6510 Systemic inflammatory response syndrome (SIRS) of non-infectious origin without acute organ dysfunction: Secondary | ICD-10-CM | POA: Diagnosis not present

## 2023-03-08 DIAGNOSIS — F339 Major depressive disorder, recurrent, unspecified: Secondary | ICD-10-CM | POA: Diagnosis not present

## 2023-03-08 DIAGNOSIS — R55 Syncope and collapse: Secondary | ICD-10-CM

## 2023-03-08 DIAGNOSIS — R7303 Prediabetes: Secondary | ICD-10-CM

## 2023-03-08 DIAGNOSIS — I10 Essential (primary) hypertension: Secondary | ICD-10-CM

## 2023-03-08 DIAGNOSIS — U071 COVID-19: Secondary | ICD-10-CM

## 2023-03-08 NOTE — Assessment & Plan Note (Addendum)
Recent hospitalization for COVID-19 with systemic inflammatory response syndrome (SIRS). Currently experiencing fatigue and weakness, which is expected given the severity of the illness and recent hospitalization. -Encourage hydration and nutrition, including protein intake. -Recommend gradual return to work, starting with half-days for the next 1-2 weeks. -Continue Mucinex for persistent cough. -Call office in one week if cough is not improving. We will order CXR at that time.

## 2023-03-08 NOTE — Assessment & Plan Note (Signed)
Chronic. Historically well controlled on regimen of wellbutrin and trazodone. She is no longer taking Pristiq. She appears fatigued, which is understandable, but I am concerned for depression with the recent illness and her mood lower than normal today. We will monitor closely.

## 2023-03-08 NOTE — Assessment & Plan Note (Signed)
Blood pressures appear to have been fairly well controlled during hospitalization. We will monitor A1c today.

## 2023-03-08 NOTE — Assessment & Plan Note (Signed)
Syncope at home from suspected dehydration and SIRS due to COVID. No additional falls since that time. Will continue to monitor closely.

## 2023-03-08 NOTE — Progress Notes (Signed)
Shawna Clamp, DNP, AGNP-c [facility] [address] [place], [place] [address] Main Office [phone]  ESTABLISHED PATIENT- Hospital Follow-Up Visit  Blood pressure 124/78, pulse 64, weight 251 lb 12.8 oz (114.2 kg), SpO2 98%.  HPI  Emily [name]  is a 72 y.o. year old female presenting today for evaluation and management following hospitalization.   Date of Hospitalization: 03/01/2023-03/04/2023 Admitted: Yes Primary Diagnosis: Syncope related to COVID, possible SIRS/Sepsis New Medications: No D/C'd Chronic Medications: No New Providers: No Speciality F/U Scheduled: No Lab/Imaging Concerns that need further evaluation: Yes: cbc, cmp, chest x-ray  The patient, with a recent history of COVID-19 infection, presented with a significant decrease in strength and stamina following a recent hospitalization. The patient reported a fall at home during the course of her illness, which led to hospital admission. The patient was reportedly confused and disoriented at the time of the fall, with an inability to recall the year or her own birthday. The confusion was attributed to low blood pressure and dehydration, along with a sinus infection. The patient also reported a shaking leg, which was suggested to be a result of the overall illness.  During the hospital stay, the patient's medications were adjusted due to low blood pressure. The patient reported feeling weaker since returning home from the hospital, despite feeling relatively well upon discharge. The patient has been trying to maintain adequate hydration and nutrition, but reported a lack of appetite. The patient also reported a persistent cough since her illness.  The patient has been using a walker for balance since the fall, but does not feel it is necessary at home. The patient reported a fear of returning to work due to her current physical state, but expressed a desire to attempt a half-day shift.  The  patient's recent chest x-ray showed under-inflated lungs and an elevated diaphragm on the right side, but no signs of pneumonia or inflammation. 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 The patient's blood sugar levels were monitored during the hospital stay due to the administration of steroids, which can increase blood sugar levels. The patient's albumin levels were found to be low, indicating poor protein intake.o work.    ROS All ROS negative with exception of what is listed in HPI  PHYSICAL EXAM Physical Exam Vitals and nursing note reviewed.  Constitutional:      General: She is not in acute distress.    Appearance: Normal appearance. She is ill-appearing.  HENT:     Head: Normocephalic.  Eyes:     Conjunctiva/sclera: Conjunctivae normal.     Pupils: Pupils are equal, round, and reactive to light.  Neck:     Vascular: No carotid bruit.  Cardiovascular:     Rate and Rhythm: Normal rate and regular rhythm.     Pulses: Normal pulses.     Heart sounds: Normal heart sounds.  Pulmonary:     Effort: Pulmonary effort is normal.     Breath sounds: Normal breath sounds.  Musculoskeletal:        General: Normal range of motion.     Cervical back: Normal range of motion.     Right lower leg: No edema.     Left lower leg: No edema.  Lymphadenopathy:     Cervical: No cervical adenopathy.  Skin:    General: Skin is warm.  Neurological:     General: No focal deficit present.     Mental Status: She is alert and oriented to person, place, and time.     Motor: Weakness present.  Psychiatric:        Mood and Affect: Mood normal.     ASSESSMENT & PLAN Problem List Items Addressed This Visit     Depression, recurrent (HCC)    Chronic. Historically well controlled on regimen of wellbutrin and trazodone. She is  no longer taking Pristiq. She appears fatigued, which is understandable, but I am concerned for depression with the recent illness and her mood lower than normal today. We will monitor closely.       Hypertension    Recent hospitalization for episode of hypotension and dehydration leading to confusion and fall. BP has stabilized.  -Encourage adequate fluid intake. - Continue to take your regular medications.  - Monitor BP at home daily.       COVID - Primary    Infection appears to have resolved, but residual post covid symptoms persist. We will continue to monitor and provide supportive care.  - Stay well hydrated and rested - Avoid pushing yourself too quickly. Start out 1/2 days at work and increase as you feel you can.  - Increase your protein intake       Relevant Orders   CBC with Differential/Platelet   Comprehensive metabolic panel   Syncope    Syncope at home from suspected dehydration and SIRS due to COVID. No additional falls since that time. Will continue to monitor closely.       SIRS (systemic inflammatory response syndrome) vs sepsis d/t COVID    Recent hospitalization for COVID-19 with systemic inflammatory response syndrome (SIRS). Currently experiencing fatigue and weakness, which is expected given the severity of the illness and recent hospitalization. -Encourage hydration and nutrition, including protein intake. -Recommend gradual return to work, starting with half-days for the next 1-2 weeks. -Continue Mucinex for persistent cough. -Call office in one week if cough is not improving. We will order CXR at that time.       Relevant Orders   CBC with Differential/Platelet   Comprehensive metabolic panel   Pre-diabetes    Blood pressures appear to have been fairly well controlled during hospitalization. We will monitor A1c today.       Relevant Orders   Hemoglobin A1c     FOLLOW-UP 1 week phone call if cough not better for x-ray order.  AWV/CPE in the  next few weeks.  Shawna Clamp, DNP, AGNP-c   History, Medications, Surgery, SDOH, and Family History reviewed and updated as appropriate.

## 2023-03-08 NOTE — Assessment & Plan Note (Signed)
Infection appears to have resolved, but residual post covid symptoms persist. We will continue to monitor and provide supportive care.  - Stay well hydrated and rested - Avoid pushing yourself too quickly. Start out 1/2 days at work and increase as you feel you can.  - Increase your protein intake

## 2023-03-08 NOTE — Assessment & Plan Note (Signed)
Recent hospitalization for episode of hypotension and dehydration leading to confusion and fall. BP has stabilized.  -Encourage adequate fluid intake. - Continue to take your regular medications.  - Monitor BP at home daily.

## 2023-03-09 LAB — CBC WITH DIFFERENTIAL/PLATELET
Basophils Absolute: 0.1 10*3/uL (ref 0.0–0.2)
Basos: 1 %
EOS (ABSOLUTE): 0.2 10*3/uL (ref 0.0–0.4)
Eos: 2 %
Hematocrit: 42.7 % (ref 34.0–46.6)
Hemoglobin: 13.9 g/dL (ref 11.1–15.9)
Immature Grans (Abs): 0 10*3/uL (ref 0.0–0.1)
Immature Granulocytes: 1 %
Lymphocytes Absolute: 1.4 10*3/uL (ref 0.7–3.1)
Lymphs: 20 %
MCH: 30.2 pg (ref 26.6–33.0)
MCHC: 32.6 g/dL (ref 31.5–35.7)
MCV: 93 fL (ref 79–97)
Monocytes Absolute: 0.6 10*3/uL (ref 0.1–0.9)
Monocytes: 9 %
Neutrophils Absolute: 4.8 10*3/uL (ref 1.4–7.0)
Neutrophils: 67 %
Platelets: 341 10*3/uL (ref 150–450)
RBC: 4.61 x10E6/uL (ref 3.77–5.28)
RDW: 13.1 % (ref 11.7–15.4)
WBC: 7.1 10*3/uL (ref 3.4–10.8)

## 2023-03-09 LAB — COMPREHENSIVE METABOLIC PANEL
ALT: 21 IU/L (ref 0–32)
AST: 18 IU/L (ref 0–40)
Albumin: 4 g/dL (ref 3.8–4.8)
Alkaline Phosphatase: 54 IU/L (ref 44–121)
BUN/Creatinine Ratio: 17 (ref 12–28)
BUN: 15 mg/dL (ref 8–27)
Bilirubin Total: 0.3 mg/dL (ref 0.0–1.2)
CO2: 23 mmol/L (ref 20–29)
Calcium: 9.1 mg/dL (ref 8.7–10.3)
Chloride: 100 mmol/L (ref 96–106)
Creatinine, Ser: 0.87 mg/dL (ref 0.57–1.00)
Globulin, Total: 2.3 g/dL (ref 1.5–4.5)
Glucose: 92 mg/dL (ref 70–99)
Potassium: 4.6 mmol/L (ref 3.5–5.2)
Sodium: 139 mmol/L (ref 134–144)
Total Protein: 6.3 g/dL (ref 6.0–8.5)
eGFR: 71 mL/min/{1.73_m2} (ref >59)

## 2023-03-09 LAB — HEMOGLOBIN A1C
Est. average glucose Bld gHb Est-mCnc: 108 mg/dL
Hgb A1c MFr Bld: 5.4 % (ref 4.8–5.6)

## 2023-03-15 ENCOUNTER — Telehealth: Payer: Self-pay | Admitting: Nurse Practitioner

## 2023-03-15 NOTE — Telephone Encounter (Signed)
Pt called to let you know that she is still coughing Not as bad as it was but she is still coughing

## 2023-03-30 ENCOUNTER — Other Ambulatory Visit: Payer: Self-pay

## 2023-03-30 ENCOUNTER — Other Ambulatory Visit (HOSPITAL_BASED_OUTPATIENT_CLINIC_OR_DEPARTMENT_OTHER): Payer: Self-pay

## 2023-03-30 NOTE — Progress Notes (Signed)
This patient is appearing on a report for being at risk of failing the adherence measure for hypertension (ACEi/ARB) medications this calendar year.   Medication: Valsartan 40 mg tablets Last fill date: 11/17/2022 for 90 day supply  Reviewed chart notes and Valsartan was discontinued due to concerns of hypotension/syncope/falls during recent hospitalization. Hypertension now managed with Chlorthalidone monotherapy.     Sofie Rower, PharmD [facility] PGY-1

## 2023-04-12 ENCOUNTER — Other Ambulatory Visit (HOSPITAL_BASED_OUTPATIENT_CLINIC_OR_DEPARTMENT_OTHER): Payer: Self-pay

## 2023-04-17 DIAGNOSIS — H43812 Vitreous degeneration, left eye: Secondary | ICD-10-CM | POA: Diagnosis not present

## 2023-04-17 DIAGNOSIS — H353212 Exudative age-related macular degeneration, right eye, with inactive choroidal neovascularization: Secondary | ICD-10-CM | POA: Diagnosis not present

## 2023-04-17 DIAGNOSIS — H401132 Primary open-angle glaucoma, bilateral, moderate stage: Secondary | ICD-10-CM | POA: Diagnosis not present

## 2023-04-17 DIAGNOSIS — H353221 Exudative age-related macular degeneration, left eye, with active choroidal neovascularization: Secondary | ICD-10-CM | POA: Diagnosis not present

## 2023-04-17 DIAGNOSIS — H35412 Lattice degeneration of retina, left eye: Secondary | ICD-10-CM | POA: Diagnosis not present

## 2023-04-17 DIAGNOSIS — H31091 Other chorioretinal scars, right eye: Secondary | ICD-10-CM | POA: Diagnosis not present

## 2023-04-17 DIAGNOSIS — H353113 Nonexudative age-related macular degeneration, right eye, advanced atrophic without subfoveal involvement: Secondary | ICD-10-CM | POA: Diagnosis not present

## 2023-04-25 ENCOUNTER — Other Ambulatory Visit (HOSPITAL_BASED_OUTPATIENT_CLINIC_OR_DEPARTMENT_OTHER): Payer: Self-pay

## 2023-04-25 ENCOUNTER — Other Ambulatory Visit (HOSPITAL_BASED_OUTPATIENT_CLINIC_OR_DEPARTMENT_OTHER): Payer: Self-pay | Admitting: Nurse Practitioner

## 2023-04-25 ENCOUNTER — Other Ambulatory Visit: Payer: Self-pay

## 2023-04-25 DIAGNOSIS — I10 Essential (primary) hypertension: Secondary | ICD-10-CM

## 2023-04-25 MED ORDER — CHLORTHALIDONE 25 MG PO TABS
25.0000 mg | ORAL_TABLET | Freq: Every day | ORAL | 5 refills | Status: DC
  Filled 2023-04-25: qty 30, 30d supply, fill #0
  Filled 2023-05-28: qty 30, 30d supply, fill #1
  Filled 2023-08-24 – 2023-08-25 (×2): qty 30, 30d supply, fill #2

## 2023-05-21 DIAGNOSIS — H35412 Lattice degeneration of retina, left eye: Secondary | ICD-10-CM | POA: Diagnosis not present

## 2023-05-21 DIAGNOSIS — H353221 Exudative age-related macular degeneration, left eye, with active choroidal neovascularization: Secondary | ICD-10-CM | POA: Diagnosis not present

## 2023-05-21 DIAGNOSIS — H401132 Primary open-angle glaucoma, bilateral, moderate stage: Secondary | ICD-10-CM | POA: Diagnosis not present

## 2023-05-21 DIAGNOSIS — H31091 Other chorioretinal scars, right eye: Secondary | ICD-10-CM | POA: Diagnosis not present

## 2023-05-21 DIAGNOSIS — H43812 Vitreous degeneration, left eye: Secondary | ICD-10-CM | POA: Diagnosis not present

## 2023-05-21 DIAGNOSIS — H353113 Nonexudative age-related macular degeneration, right eye, advanced atrophic without subfoveal involvement: Secondary | ICD-10-CM | POA: Diagnosis not present

## 2023-05-21 DIAGNOSIS — H353212 Exudative age-related macular degeneration, right eye, with inactive choroidal neovascularization: Secondary | ICD-10-CM | POA: Diagnosis not present

## 2023-05-26 ENCOUNTER — Other Ambulatory Visit (HOSPITAL_BASED_OUTPATIENT_CLINIC_OR_DEPARTMENT_OTHER): Payer: Self-pay

## 2023-05-29 ENCOUNTER — Ambulatory Visit: Payer: PPO

## 2023-05-29 DIAGNOSIS — Z0000 Encounter for general adult medical examination without abnormal findings: Secondary | ICD-10-CM

## 2023-05-29 NOTE — Patient Instructions (Signed)
Ms. Kruizenga , Thank you for taking time to come for your Medicare Wellness Visit. I appreciate your ongoing commitment to your health goals. Please review the following plan we discussed and let me know if I can assist you in the future.   Referrals/Orders/Follow-Ups/Clinician Recommendations: none  This is a list of the screening recommended for you and due dates:  Health Maintenance  Topic Date Due   COVID-19 Vaccine (4 - 2023-24 season) 02/18/2023   Flu Shot  09/17/2023*   DEXA scan (bone density measurement)  03/07/2024*   Medicare Annual Wellness Visit  05/28/2024   Mammogram  09/05/2024   Colon Cancer Screening  04/13/2025   DTaP/Tdap/Td vaccine (4 - Td or Tdap) 02/25/2030   Pneumonia Vaccine  Completed   HPV Vaccine  Aged Out   Hepatitis C Screening  Discontinued   Zoster (Shingles) Vaccine  Discontinued  *Topic was postponed. The date shown is not the original due date.    Advanced directives: (Copy Requested) Please bring a copy of your health care power of attorney and living will to the office to be added to your chart at your convenience.  Next Medicare Annual Wellness Visit scheduled for next year: No, will schedule next year  Insert Preventive Care attachment Insert FALL PREVENTION attachment if needed

## 2023-05-29 NOTE — Progress Notes (Signed)
Subjective:   Emily [name] is a 72 y.o. female who presents for Medicare Annual (Subsequent) preventive examination.  Visit Complete: Virtual I connected with  Emily [name] on 05/29/23 by a video and audio enabled telemedicine application and verified that I am speaking with the correct person using two identifiers.  Patient Location: Home  Provider Location: Office/Clinic  I discussed the limitations of evaluation and management by telemedicine. The patient expressed understanding and agreed to proceed.  Vital Signs: Because this visit was a virtual/telehealth visit, some criteria may be missing or patient reported. Any vitals not documented were not able to be obtained and vitals that have been documented are patient reported.    Cardiac Risk Factors include: advanced age (>21men, >48 women);hypertension     Objective:    Today's Vitals   05/29/23 1450  PainSc: 3    There is no height or weight on file to calculate BMI.     05/29/2023    2:58 PM 03/01/2023    4:49 PM 03/01/2023    9:01 AM 03/29/2022    1:15 PM 12/27/2021   10:13 AM 10/14/2021    9:41 AM 03/15/2021    6:42 PM  Advanced Directives  Does Patient Have a Medical Advance Directive? Yes Yes Yes No Yes Yes Yes  Type of Estate agent of [place];Living will Healthcare Power of [employer] Power of [employer] Power of [place];Living will Healthcare Power of [place];Living will Healthcare Power of [place];Living will  Does patient want to make changes to medical advance directive?  No - Patient declined   No - Patient declined No - Patient declined   Copy of Healthcare Power of Attorney in Chart? No - copy requested    No - copy requested No - copy requested   Would patient like information on creating a medical advance directive?    No - Patient declined       Current Medications (verified) Outpatient Encounter Medications as of 05/29/2023  Medication Sig    albuterol (VENTOLIN HFA) 108 (90 Base) MCG/ACT inhaler Inhale 2 puffs into the lungs every 6 (six) hours as needed for wheezing or shortness of breath.   ALPRAZolam (XANAX) 1 MG tablet Take 0.5-1 tablets (0.5-1 mg total) by mouth daily as needed. This is a 90-day supply   aspirin EC 81 MG tablet Take 81 mg by mouth daily. Swallow whole. Takes once a week   azelastine (OPTIVAR) 0.05 % ophthalmic solution Place 2 drops into both eyes 2 (two) times daily.   Blood Pressure Monitor MISC Use as directed   buPROPion (WELLBUTRIN XL) 150 MG 24 hr tablet Take 1 tablet (150 mg total) by mouth every morning.   Cholecalciferol (VITAMIN D3) 1.25 MG (50000 UT) capsule Take 1 capsule (50,000 Units total) by mouth once a week.   fluticasone (FLONASE) 50 MCG/ACT nasal spray Place 1 spray into both nostrils daily.   guaiFENesin (MUCINEX) 600 MG 12 hr tablet Take 600 mg by mouth 2 (two) times daily.   meclizine (ANTIVERT) 25 MG tablet Take 1 tablet (25 mg total) by mouth 3 (three) times daily as needed for dizziness.   Netarsudil-Latanoprost (ROCKLATAN) 0.02-0.005 % SOLN INSTILL 1 DROP INTO EACH EYE AT BEDTIME   ondansetron (ZOFRAN) 4 MG tablet Take 1 tablet (4 mg total) by mouth every 6 (six) to 8 (eight) hours as needed.   traZODone (DESYREL) 100 MG tablet Take 3 tablets (300 mg total) by mouth at bedtime.   chlorthalidone (  HYGROTON) 25 MG tablet Take 1 tablet (25 mg total) by mouth daily. (Patient not taking: Reported on 05/29/2023)   No facility-administered encounter medications on file as of 05/29/2023.    Allergies (verified) Doxycycline and Prednisone   History: Past Medical History:  Diagnosis Date   Acute bacterial sinusitis 06/29/2021   Acute non-recurrent ethmoidal sinusitis 08/05/2021   Acute non-recurrent pansinusitis 01/25/2022   Acute recurrent frontal sinusitis 03/08/2020   Allergy    seasonal    Anxiety    ARTHRALGIA 11/10/2009   Qualifier: Diagnosis of  By: Caryl Never MD, Bruce      Cancer [facility]) 2009   squamous cell skin   Cataract    bilateral surgery, left unsuccessful   Cough 11/23/2020   Cystitis 02/12/2020   Depression    DYSPNEA 11/10/2009   Qualifier: Diagnosis of  By: Caryl Never MD, Bruce     Eating disorder    GERD (gastroesophageal reflux disease)    Glaucoma    Head injury 07/23/2018   Hiatal hernia    History of cardiovascular stress test 04/2000   cardiolyte   History of flexible sigmoidoscopy 09/21/1999   Influenza A 03/13/2022   Other fatigue 02/26/2020   Otitis of both ears 03/08/2020   Pain of left lower extremity 03/28/2021   Preoperative evaluation of a medical condition to rule out surgical contraindications (TAR required) 10/18/2021   Shortness of breath 07/29/2022   Sinobronchitis 04/05/2021   Sinusitis 03/01/2023   Subacute cough 11/23/2020   WEIGHT GAIN 11/10/2009   Qualifier: Diagnosis of  By: Caryl Never MD, Bruce     Past Surgical History:  Procedure Laterality Date   ABDOMINAL HYSTERECTOMY     APPENDECTOMY     CARDIAC CATHETERIZATION  04/2000   CARDIAC CATHETERIZATION     COLONOSCOPY  12/31/09   UPPER GASTROINTESTINAL ENDOSCOPY  09/15/1999   WISDOM TOOTH EXTRACTION     Family History  Problem Relation Age of Onset   Arthritis Other    Cancer Other        lung   Coronary artery disease Other    Stroke Other    Colon cancer Neg Hx    Colon polyps Neg Hx    Esophageal cancer Neg Hx    Rectal cancer Neg Hx    Stomach cancer Neg Hx    Social History   Socioeconomic History   Marital status: Married    Spouse name: Nadine Counts   Number of children: Not on file   Years of education: 18   Highest education level: Bachelor's degree (e.g., BA, AB, BS)  Occupational History   Not on file  Tobacco Use   Smoking status: Former    Current packs/day: 0.00    Average packs/day: 1 pack/day for 10.0 years (10.0 ttl pk-yrs)    Types: Cigarettes    Start date: 05/27/1998    Quit date: 05/27/2008    Years since quitting: 15.0     Passive exposure: Never   Smokeless tobacco: Never  Vaping Use   Vaping status: Never Used  Substance and Sexual Activity   Alcohol use: Yes    Comment: occ   Drug use: No   Sexual activity: Yes    Partners: Male  Other Topics Concern   Not on file  Social History Narrative   Not on file   Social Determinants of Health   Financial Resource Strain: Low Risk  (05/29/2023)   Overall Financial Resource Strain (CARDIA)    Difficulty of Paying Living Expenses: Not hard  at all  Food Insecurity: No Food Insecurity (05/29/2023)   Hunger Vital Sign    Worried About Running Out of Food in the Last Year: Never true    Ran Out of Food in the Last Year: Never true  Transportation Needs: No Transportation Needs (05/29/2023)   PRAPARE - Administrator, Civil Service (Medical): No    Lack of Transportation (Non-Medical): No  Physical Activity: Inactive (05/29/2023)   Exercise Vital Sign    Days of Exercise per Week: 0 days    Minutes of Exercise per Session: 0 min  Stress: No Stress Concern Present (05/29/2023)   [employer] of Occupational Health - Occupational Stress Questionnaire    Feeling of Stress : Not at all  Social Connections: Unknown (05/29/2023)   Social Connection and Isolation Panel [NHANES]    Frequency of Communication with Friends and Family: Once a week    Frequency of Social Gatherings with Friends and Family: Not on file    Attends Religious Services: 1 to 4 times per year    Active Member of [employer] or Organizations: No    Attends Engineer, structural: Never    Marital Status: Married    Tobacco Counseling Counseling given: Not Answered   Clinical Intake:  Pre-visit preparation completed: Yes  Pain : 0-10 Pain Score: 3  Pain Type: Chronic pain Pain Location: Knee Pain Orientation: Left, Right Pain Descriptors / Indicators: Aching Pain Onset: More than a month ago Pain Frequency: Constant     Nutritional Risks: None Diabetes:  No  How often do you need to have someone help you when you read instructions, pamphlets, or other written materials from your doctor or pharmacy?: 1 - Never  Interpreter Needed?: No  Information entered by :: NAllen LPN   Activities of Daily Living    05/29/2023    2:51 PM 03/01/2023    9:36 PM  In your present state of health, do you have any difficulty performing the following activities:  Hearing? 0   Vision? 1   Comment goes to eye doctor regularly   Difficulty concentrating or making decisions? 0   Walking or climbing stairs? 1   Comment due to knees   Dressing or bathing? 0   Doing errands, shopping? 0 0  Preparing Food and eating ? N   Using the Toilet? N   In the past six months, have you accidently leaked urine? Y   Do you have problems with loss of bowel control? N   Managing your Medications? N   Managing your Finances? N   Housekeeping or managing your Housekeeping? N     Patient Care Team: Early, Sung Amabile, NP as PCP - General (Nurse Practitioner) Jodelle Red, MD as PCP - Cardiology (Cardiology) Lutricia Feil, [facility] as Pharmacist (Pharmacist) Omar Person, MD as Consulting Physician (Pulmonary Disease) Sinda Du, MD as Consulting Physician (Ophthalmology) Doy Hutching, [place] (Physician Assistant)  Indicate any recent Medical Services you may have received from other than [facility] providers in the past year (date may be approximate).     Assessment:   This is a routine wellness examination for Lockie.  Hearing/Vision screen Hearing Screening - Comments:: Denies hearing issues Vision Screening - Comments:: Regular eye exams, Dr. Cathey Endow and [facility]   Goals Addressed             This Visit's Progress    Patient Stated       05/29/2023, wants to be healthier  Depression Screen    05/29/2023    3:00 PM 10/14/2021    9:33 AM 10/14/2021    9:00 AM 01/06/2021    2:45 PM 02/14/2018    3:59 PM 11/08/2015    3:20 PM   PHQ 2/9 Scores  PHQ - 2 Score 1 2 1 6  0 2  PHQ- 9 Score 4 5  17  7   Exception Documentation   Medical reason   --    Fall Risk    05/29/2023    2:59 PM 01/24/2022   12:02 PM 10/14/2021    9:00 AM 05/14/2019    9:30 AM 02/14/2018    3:59 PM  Fall Risk   Falls in the past year? 1 0 0 0 Yes  Comment legs gave out   Emmi Telephone Survey: data to providers prior to load   Number falls in past yr: 0 0 0    Injury with Fall? 0 0 0    Risk for fall due to : Medication side effect;Impaired mobility;Impaired balance/gait  No Fall Risks    Follow up Falls prevention discussed;Falls evaluation completed Falls evaluation completed;Education provided Falls evaluation completed;Education provided      MEDICARE RISK AT HOME: Medicare Risk at Home Any stairs in or around the home?: Yes If so, are there any without handrails?: Yes Home free of loose throw rugs in walkways, pet beds, electrical cords, etc?: Yes Adequate lighting in your home to reduce risk of falls?: Yes Life alert?: No Use of a cane, walker or w/c?: Yes Grab bars in the bathroom?: No Shower chair or bench in shower?: Yes Elevated toilet seat or a handicapped toilet?: No  TIMED UP AND GO:  Was the test performed?  No    Cognitive Function:        05/29/2023    3:02 PM 10/14/2021    9:35 AM  6CIT Screen  What Year? 0 points 0 points  What month? 0 points 0 points  What time? 0 points 0 points  Count back from 20 0 points 0 points  Months in reverse 0 points 0 points  Repeat phrase 2 points 0 points  Total Score 2 points 0 points    Immunizations Immunization History  Administered Date(s) Administered   Fluad Quad(high Dose 65+) 05/08/2019, 02/26/2020   Influenza Split 02/17/2017   Influenza Whole 06/19/2004   Influenza, High Dose Seasonal PF 02/23/2016   Influenza,inj,Quad PF,6+ Mos 03/17/2014   PFIZER(Purple Top)SARS-COV-2 Vaccination 10/02/2019, 10/29/2019, 06/04/2020   Pneumococcal Conjugate-13 02/02/2016    Pneumococcal Polysaccharide-23 02/26/2020   Td 11/04/1997, 11/19/2009   Tdap 02/26/2020    TDAP status: Up to date  Flu Vaccine status: Due, Education has been provided regarding the importance of this vaccine. Advised may receive this vaccine at local pharmacy or Health Dept. Aware to provide a copy of the vaccination record if obtained from local pharmacy or Health Dept. Verbalized acceptance and understanding.  Pneumococcal vaccine status: Up to date  Covid-19 vaccine status: Information provided on how to obtain vaccines.   Qualifies for Shingles Vaccine? Yes   Zostavax completed No   Shingrix Completed?: No.    Education has been provided regarding the importance of this vaccine. Patient has been advised to call insurance company to determine out of pocket expense if they have not yet received this vaccine. Advised may also receive vaccine at local pharmacy or Health Dept. Verbalized acceptance and understanding.  Screening Tests Health Maintenance  Topic Date Due   COVID-19 Vaccine (  4 - 2023-24 season) 02/18/2023   INFLUENZA VACCINE  09/17/2023 (Originally 01/18/2023)   DEXA SCAN  03/07/2024 (Originally 04/25/2015)   Medicare Annual Wellness (AWV)  05/28/2024   MAMMOGRAM  09/05/2024   Colonoscopy  04/13/2025   DTaP/Tdap/Td (4 - Td or Tdap) 02/25/2030   Pneumonia Vaccine 74+ Years old  Completed   HPV VACCINES  Aged Out   Hepatitis C Screening  Discontinued   Zoster Vaccines- Shingrix  Discontinued    Health Maintenance  Health Maintenance Due  Topic Date Due   COVID-19 Vaccine (4 - 2023-24 season) 02/18/2023    Colorectal cancer screening: Type of screening: Colonoscopy. Completed 04/14/2015. Repeat every 10 years  Mammogram status: Completed 09/06/2022. Repeat every year  Bone Density status: declines at this time  Lung Cancer Screening: (Low Dose CT Chest recommended if Age 33-80 years, 20 pack-year currently smoking OR have quit w/in 15years.) does not qualify.    Lung Cancer Screening Referral: no  Additional Screening:  Hepatitis C Screening: does not qualify;   Vision Screening: Recommended annual ophthalmology exams for early detection of glaucoma and other disorders of the eye. Is the patient up to date with their annual eye exam?  Yes  Who is the provider or what is the name of the office in which the patient attends annual eye exams? Dr. Cathey Endow, [facility] If pt is not established with a provider, would they like to be referred to a provider to establish care? No .   Dental Screening: Recommended annual dental exams for proper oral hygiene  Diabetic Foot Exam: n/a  Community Resource Referral / Chronic Care Management: CRR required this visit?  No   CCM required this visit?  No     Plan:     I have personally reviewed and noted the following in the patient's chart:   Medical and social history Use of alcohol, tobacco or illicit drugs  Current medications and supplements including opioid prescriptions. Patient is not currently taking opioid prescriptions. Functional ability and status Nutritional status Physical activity Advanced directives List of other physicians Hospitalizations, surgeries, and ER visits in previous 12 months Vitals Screenings to include cognitive, depression, and falls Referrals and appointments  In addition, I have reviewed and discussed with patient certain preventive protocols, quality metrics, and best practice recommendations. A written personalized care plan for preventive services as well as general preventive health recommendations were provided to patient.     Barb Merino, LPN   16/03/9603   After Visit Summary: (MyChart) Due to this being a telephonic visit, the after visit summary with patients personalized plan was offered to patient via MyChart   Nurse Notes: none

## 2023-06-04 ENCOUNTER — Other Ambulatory Visit (HOSPITAL_BASED_OUTPATIENT_CLINIC_OR_DEPARTMENT_OTHER): Payer: Self-pay

## 2023-06-04 ENCOUNTER — Other Ambulatory Visit: Payer: Self-pay | Admitting: Nurse Practitioner

## 2023-06-04 DIAGNOSIS — H1033 Unspecified acute conjunctivitis, bilateral: Secondary | ICD-10-CM

## 2023-06-04 MED ORDER — ALPRAZOLAM 1 MG PO TABS
0.5000 mg | ORAL_TABLET | Freq: Every day | ORAL | 0 refills | Status: AC | PRN
Start: 2023-06-04 — End: ?
  Filled 2023-06-04: qty 45, 90d supply, fill #0

## 2023-06-05 ENCOUNTER — Other Ambulatory Visit (HOSPITAL_BASED_OUTPATIENT_CLINIC_OR_DEPARTMENT_OTHER): Payer: Self-pay

## 2023-06-05 DIAGNOSIS — H10413 Chronic giant papillary conjunctivitis, bilateral: Secondary | ICD-10-CM | POA: Diagnosis not present

## 2023-06-05 MED ORDER — AZELASTINE HCL 0.05 % OP SOLN
2.0000 [drp] | Freq: Two times a day (BID) | OPHTHALMIC | 5 refills | Status: DC
  Filled 2023-06-05 – 2023-06-08 (×3): qty 6, 15d supply, fill #0
  Filled 2023-07-12: qty 6, 15d supply, fill #1
  Filled 2023-08-24 – 2023-08-25 (×2): qty 6, 15d supply, fill #2
  Filled 2023-09-16: qty 6, 15d supply, fill #3
  Filled 2023-10-07: qty 6, 15d supply, fill #4
  Filled 2023-10-18: qty 6, 15d supply, fill #5

## 2023-06-07 ENCOUNTER — Other Ambulatory Visit (HOSPITAL_BASED_OUTPATIENT_CLINIC_OR_DEPARTMENT_OTHER): Payer: Self-pay

## 2023-06-08 ENCOUNTER — Other Ambulatory Visit (HOSPITAL_BASED_OUTPATIENT_CLINIC_OR_DEPARTMENT_OTHER): Payer: Self-pay

## 2023-06-19 DIAGNOSIS — H353221 Exudative age-related macular degeneration, left eye, with active choroidal neovascularization: Secondary | ICD-10-CM | POA: Diagnosis not present

## 2023-06-23 ENCOUNTER — Other Ambulatory Visit (HOSPITAL_BASED_OUTPATIENT_CLINIC_OR_DEPARTMENT_OTHER): Payer: Self-pay

## 2023-07-12 ENCOUNTER — Encounter: Payer: Self-pay | Admitting: Family Medicine

## 2023-07-12 ENCOUNTER — Telehealth: Payer: PPO | Admitting: Family Medicine

## 2023-07-12 ENCOUNTER — Other Ambulatory Visit (INDEPENDENT_AMBULATORY_CARE_PROVIDER_SITE_OTHER): Payer: PPO

## 2023-07-12 ENCOUNTER — Other Ambulatory Visit: Payer: Self-pay | Admitting: Nurse Practitioner

## 2023-07-12 ENCOUNTER — Other Ambulatory Visit: Payer: Self-pay

## 2023-07-12 VITALS — BP 159/92 | Temp 97.9°F | Ht 66.5 in | Wt 250.0 lb

## 2023-07-12 DIAGNOSIS — J029 Acute pharyngitis, unspecified: Secondary | ICD-10-CM | POA: Diagnosis not present

## 2023-07-12 DIAGNOSIS — J069 Acute upper respiratory infection, unspecified: Secondary | ICD-10-CM

## 2023-07-12 DIAGNOSIS — I10 Essential (primary) hypertension: Secondary | ICD-10-CM

## 2023-07-12 DIAGNOSIS — J0111 Acute recurrent frontal sinusitis: Secondary | ICD-10-CM

## 2023-07-12 LAB — POC COVID19 BINAXNOW: SARS Coronavirus 2 Ag: NEGATIVE

## 2023-07-12 LAB — POCT INFLUENZA A/B
Influenza A, POC: NEGATIVE
Influenza B, POC: NEGATIVE

## 2023-07-12 NOTE — Progress Notes (Signed)
Start time: 12:05 End time: 12:41  Virtual Visit via Video Note  I connected with Emily [name] on 07/12/23 by a video enabled telemedicine application and verified that I am speaking with the correct person using two identifiers.  Location: Patient: home Provider: office   I discussed the limitations of evaluation and management by telemedicine and the availability of in person appointments. The patient expressed understanding and agreed to proceed.  History of Present Illness:  Chief Complaint  Patient presents with   Headache   Cc: HA, ST, ear pain, nausea  She has had fatigue x 48 hours. She started coughing yesterday, along with runny nose, chest congestion. Nasal mucus is sometimes green-tinged, mostly clear. Cough is productive of yellow-ish "murky" phlegm.  Last night, she felt sick on her stomach, reports it was acid reflux. She took an antacid. She got woken up again with bad heartburn last night, with nausea.  Antacid helped her get back to sleep.  This morning-- "I just feel bad". Mild myalgias. No known fever/chills.  Bad headache across her forehead, ears feel full, can sometimes hear her pulse in her ear. She has some nausea this morning, not as bad as last night.  No known sick contacts. Works at a funeral home. Also went to a funeral this past weekend.  She has been taking benadryl at bedtime for a bit, to help treat allergies, keep them from interfering with her sleep. Also recalls she took it once during the day on Sunday  Other OTC med taken for her current symptoms is Guaifenesin, extended release  Hasn't had a flu shot   She had COVID 02/2023, hospitalized She has a h/o HTN. She states that her BP med was stopped while in the hospital. Chlorthalidone taken today, due to BP being elevated. Hasn't been monitoring BP since hospital discharge, and off medication.  Chart reviewed--she saw SB for hospital f/u in 02/2023, and BP was fine, off the  chlorthalidone.  BP Readings from Last 3 Encounters:  07/12/23 (!) 159/92  03/08/23 124/78  03/04/23 134/67    PMH, PSH, SH reviewed  Spinal stenosis, HTN, depression/anxiety, pre-diabetes, vertigo. 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  Outpatient Encounter Medications as of 07/12/2023  Medication Sig Note   aspirin EC 81 MG tablet Take 81 mg by mouth daily. Swallow whole. Takes once a week    azelastine (OPTIVAR) 0.05 % ophthalmic solution Place 2 drops into both eyes 2 (two) times daily.    buPROPion (WELLBUTRIN XL) 150 MG 24 hr tablet Take 1 tablet (150 mg total) by mouth every morning.    Cholecalciferol (VITAMIN D3) 1.25 MG (50000 UT) capsule Take 1 capsule (50,000 Units total) by mouth once a week. 03/01/2023: Patient takes on Sunday.   fluticasone (FLONASE) 50 MCG/ACT nasal spray Place 1 spray into both nostrils daily.    guaiFENesin (MUCINEX) 600 MG 12 hr tablet Take 600 mg by mouth 2 (two) times daily.    meclizine (ANTIVERT) 25 MG tablet Take 1 tablet (25 mg total) by mouth 3 (three) times daily as needed for dizziness.    Netarsudil-Latanoprost (ROCKLATAN) 0.02-0.005 % SOLN INSTILL 1 DROP INTO EACH EYE AT BEDTIME 07/12/2023: Took this morning, for the first time in a while   ondansetron (ZOFRAN) 4 MG tablet Take 1 tablet (4 mg total) by mouth every 6 (six) to 8 (eight) hours as needed. (Patient not taking: Reported on 07/12/2023)    traZODone (DESYREL) 100 MG tablet Take 3 tablets (  300 mg total) by mouth at bedtime.    albuterol (VENTOLIN HFA) 108 (90 Base) MCG/ACT inhaler Inhale 2 puffs into the lungs every 6 (six) hours as needed for wheezing or shortness of breath. (Patient not taking: Reported on 07/12/2023)    ALPRAZolam (XANAX) 1 MG tablet Take 0.5-1 tablets (0.5-1 mg total) by mouth daily as needed. This is a 90-day supply (Patient not taking: Reported on 07/12/2023)    ALPRAZolam (XANAX) 1 MG tablet Take 0.5-1 tablets (0.5-1 mg total) by mouth daily  as needed. *this is 90 day supply (Patient not taking: Reported on 07/12/2023)    Blood Pressure Monitor MISC Use as directed    chlorthalidone (HYGROTON) 25 MG tablet Take 1 tablet (25 mg total) by mouth daily. (Patient not taking: Reported on 05/29/2023) 07/12/2023: Took one today, for the first time since hospital discharge in September   No facility-administered encounter medications on file as of 07/12/2023.   Allergies  Allergen Reactions   Doxycycline     Gi upset   Prednisone     REACTION: agressive behavior   ROS:  URI/GI symptoms per HPI. No fever or chills. Mild myalgias. No chest pain or shortness of breath. Yesterday she had some chest tightness, relieved by rest. No DOE. No rash. See HPI    Observations/Objective:  BP (!) 159/92   Temp 97.9 F (36.6 C) (Oral)   Ht 5' 6.5" (1.689 m)   Wt 250 lb (113.4 kg)   BMI 39.75 kg/m   Well-appearing pleasant female, in no distress. She is speaking comfortably. Intermittent cough (sounds dry). She is alert and oriented. Cranial nerves are grossly intact Exam is limited due to the virtual nature of the visit.  Assessment and Plan:  Viral upper respiratory tract infection - supportive measures reviewed.  Tested negative for flu and COVID. f/u if sx persist/worsen  Primary hypertension - BP normal on hospital f/u; elevated today. May be related to illness. Rec low Na diet, monitor BP regularly. resume diuretic and f/u with PCP if remains high  Couldn't tolerate paxlovid--made her feel very sick--refuses to take this again if she tests + for COVID. Willing to take Tamiflu if tests + for influenza. Uses [facility] If tests are negative, we discussed supportive measures.  Work note--seen today for illness. She is off today/tomorrow, but works the weekend.  Stay well hydrated. Continue mucinex as directed on the box. This is an expectorant that keep the mucus thin. You may use a separate Delsym syrup as needed  for cough, in addition to the plain mucinex. OR, you can switch to a combination medication, such as Mucinex DM 12 hour, which contains the expectorant AND the cough suppressant. I recommend doing sinus rinses with saline with either Sinus Rinse Kit or a Neti-pot.  Try this up to twice daily.  Be sure to use either boiled water or distilled water (not tap water). Use tylenol as needed for body aches and/or fever. Stay home while you are feeling bad. You definitely wan to stay home if you develop any fever (until you are fever-free for at least 24 hours without taking any fever-reducing medication. Also stay home until your respiratory symptoms are significantly improved, and wear a mask if still coughing and out with others.  Contact us if you develop persistently high fevers, pain with breathing, shortness of breath, or other new/concerning symptoms.  Please check your blood pressure daily. It often can be higher than normal when you are sick. Be sure to  limit your sodium in your diet (<2000-2500 mg daily).  Look at your labels on your food. Canned soups and canned foods are high in sodium.  If your blood pressure remains >135-140/85-90 then you should touch base with [facility], and likely restart the chlorthalidone.   Follow Up Instructions:    I discussed the assessment and treatment plan with the patient. The patient was provided an opportunity to ask questions and all were answered. The patient agreed with the plan and demonstrated an understanding of the instructions.   The patient was advised to call back or seek an in-person evaluation if the symptoms worsen or if the condition fails to improve as anticipated.  I spent 40 minutes dedicated to the care of this patient, including pre-visit review of records, face to face time, post-visit ordering of testing and documentation.    Lavonda Jumbo, MD

## 2023-07-12 NOTE — Patient Instructions (Addendum)
Stay well hydrated. Continue mucinex as directed on the box. This is an expectorant that keep the mucus thin. You may use a separate Delsym syrup as needed for cough, in addition to the plain mucinex. OR, you can switch to a combination medication, such as Mucinex DM 12 hour, which contains the expectorant AND the cough suppressant. I recommend doing sinus rinses with saline with either Sinus Rinse Kit or a Neti-pot.  Try this up to twice daily.  Be sure to use either boiled water or distilled water (not tap water). Use tylenol as needed for body aches and/or fever. Stay home while you are feeling bad. You definitely wan to stay home if you develop any fever (until you are fever-free for at least 24 hours without taking any fever-reducing medication. Also stay home until your respiratory symptoms are significantly improved, and wear a mask if still coughing and out with others.  Contact us if you develop persistently high fevers, pain with breathing, shortness of breath, or other new/concerning symptoms.  Please check your blood pressure daily. It often can be higher than normal when you are sick. Be sure to limit your sodium in your diet (<2000-2500 mg daily).  Look at your labels on your food. Canned soups and canned foods are high in sodium.  If your blood pressure remains >135-140/85-90 then you should touch base with [facility], and likely restart the chlorthalidone.

## 2023-07-13 ENCOUNTER — Other Ambulatory Visit: Payer: Self-pay

## 2023-07-13 ENCOUNTER — Encounter: Payer: Self-pay | Admitting: Family Medicine

## 2023-07-13 ENCOUNTER — Other Ambulatory Visit (HOSPITAL_BASED_OUTPATIENT_CLINIC_OR_DEPARTMENT_OTHER): Payer: Self-pay

## 2023-07-13 MED ORDER — ONDANSETRON HCL 4 MG PO TABS
4.0000 mg | ORAL_TABLET | Freq: Four times a day (QID) | ORAL | 0 refills | Status: DC | PRN
Start: 2023-07-13 — End: 2023-09-11
  Filled 2023-07-13: qty 30, 30d supply, fill #0

## 2023-07-16 DIAGNOSIS — M47816 Spondylosis without myelopathy or radiculopathy, lumbar region: Secondary | ICD-10-CM | POA: Diagnosis not present

## 2023-07-19 DIAGNOSIS — H401131 Primary open-angle glaucoma, bilateral, mild stage: Secondary | ICD-10-CM | POA: Diagnosis not present

## 2023-07-23 IMAGING — DX DG CHEST 2V
2 series · 2 of 2 positions shown · non-contrast
Comparison: 04/18/2018

CLINICAL DATA: 69-year-old female with a history of dyspnea

EXAM:
CHEST - 2 VIEW

[chest pa]
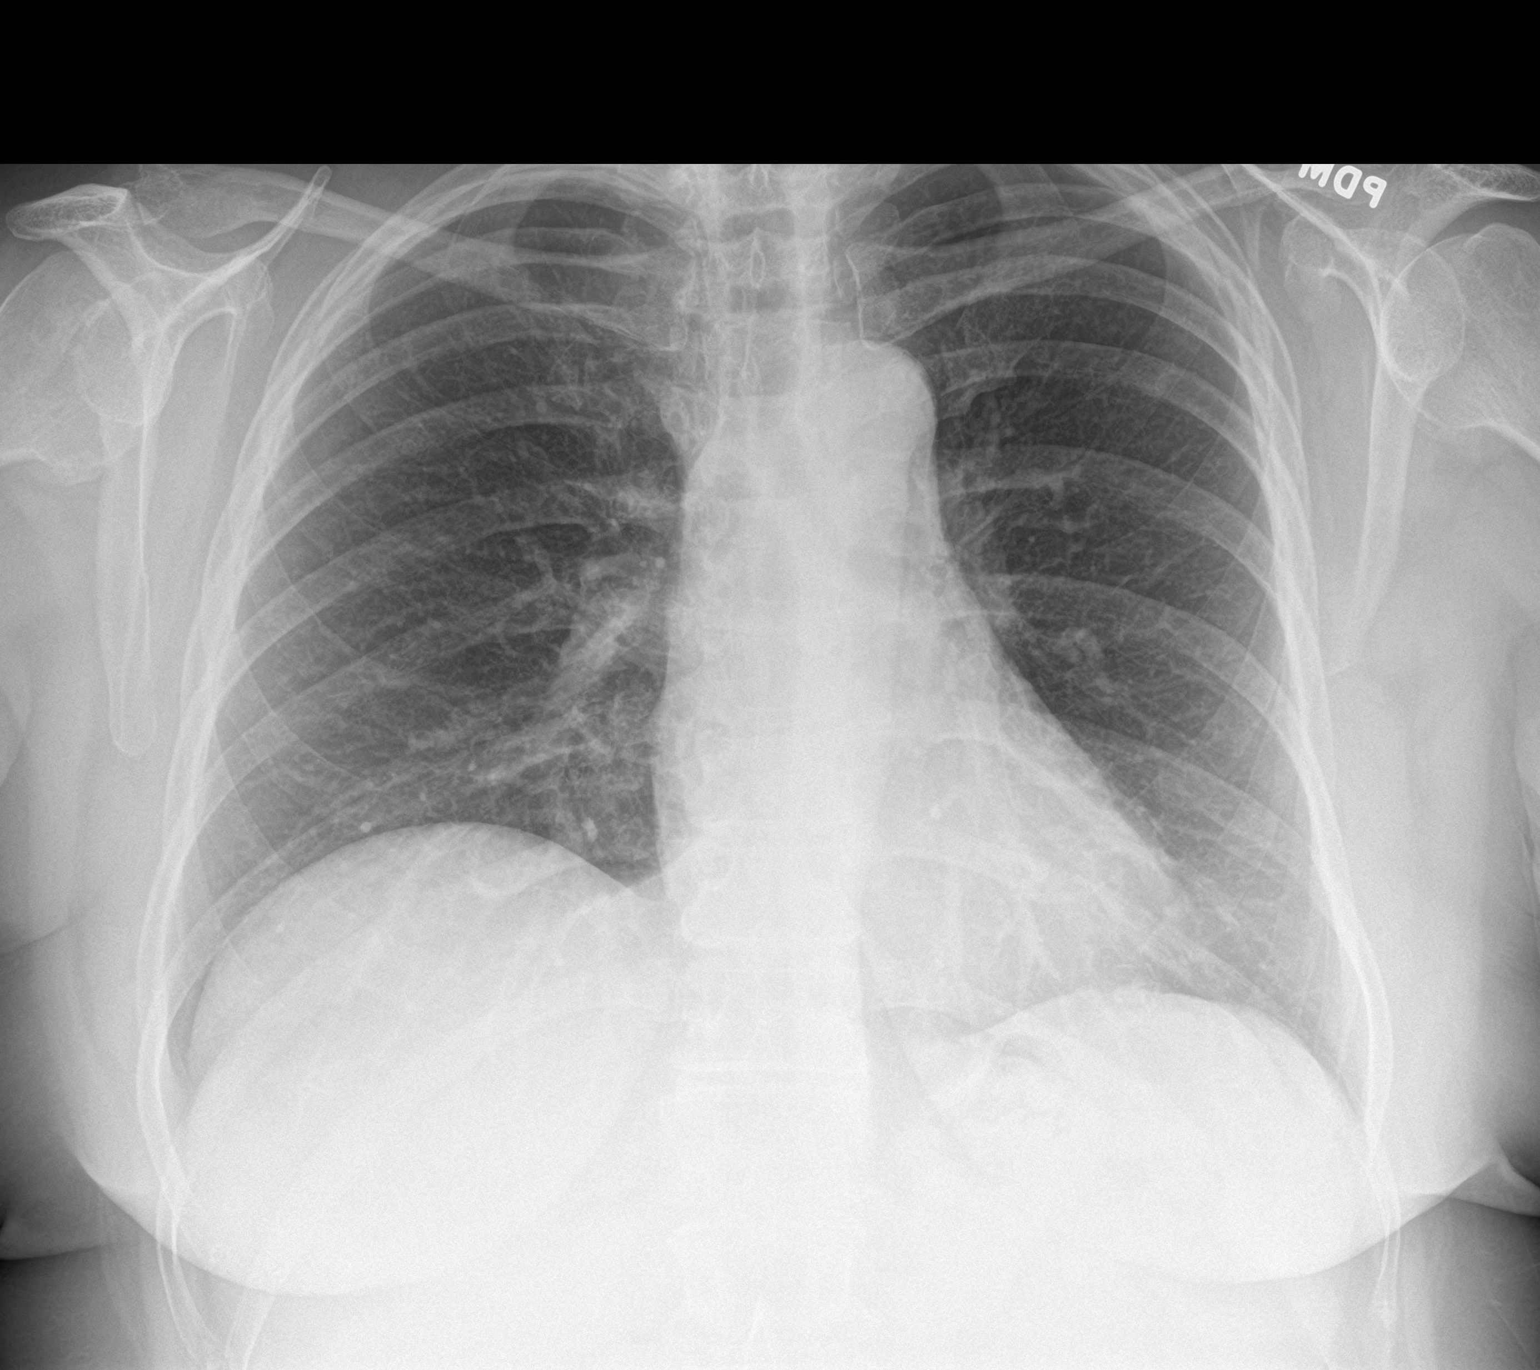

[chest lat]
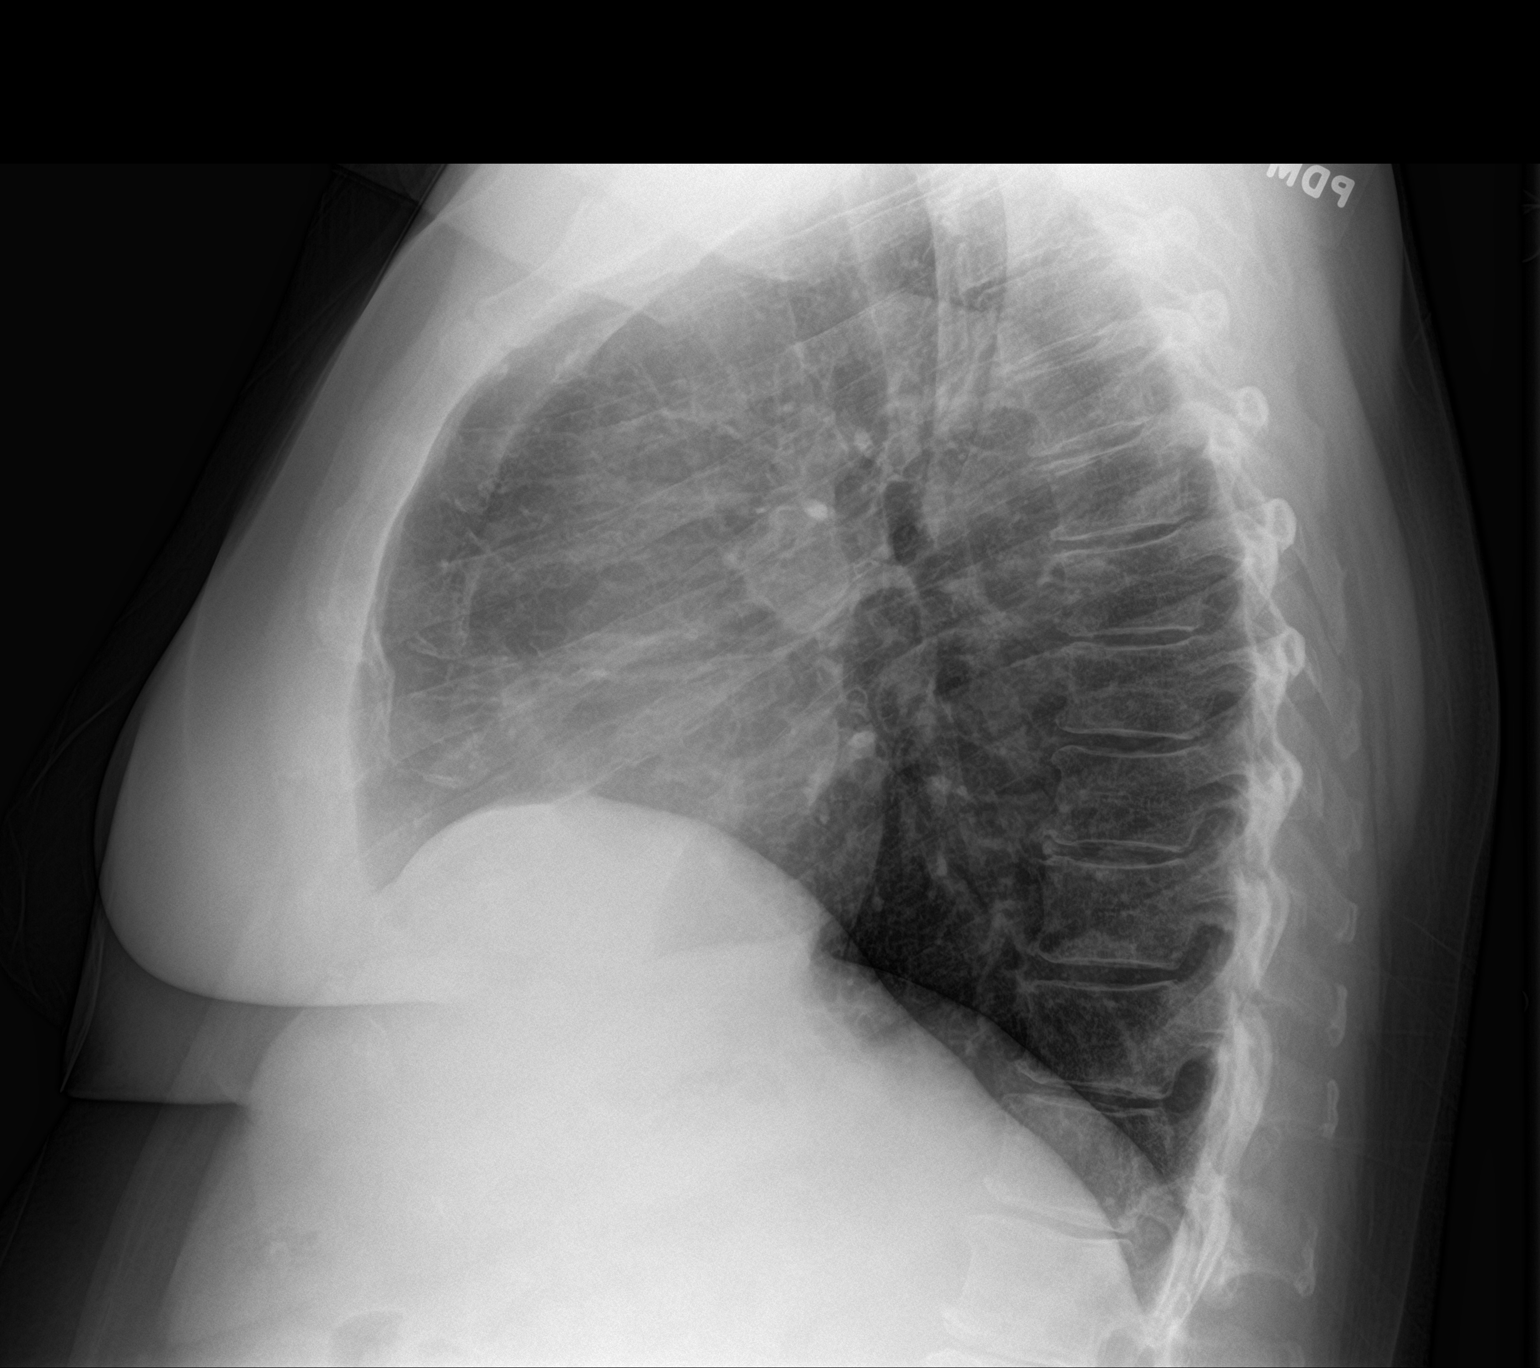

[2 of 2 positions shown; findings below may reference images not displayed]

FINDINGS: Cardiomediastinal silhouette unchanged in size and contour. No
evidence of central vascular congestion. No interlobular septal
thickening.

No pneumothorax or pleural effusion. Coarsened interstitial
markings, with no confluent airspace disease.

No acute displaced fracture. Degenerative changes of the spine.
IMPRESSION: No active cardiopulmonary disease.

## 2023-07-24 DIAGNOSIS — H353221 Exudative age-related macular degeneration, left eye, with active choroidal neovascularization: Secondary | ICD-10-CM | POA: Diagnosis not present

## 2023-08-02 ENCOUNTER — Telehealth: Payer: PPO | Admitting: Medical

## 2023-08-02 ENCOUNTER — Other Ambulatory Visit: Payer: Self-pay | Admitting: Student

## 2023-08-02 ENCOUNTER — Other Ambulatory Visit (HOSPITAL_BASED_OUTPATIENT_CLINIC_OR_DEPARTMENT_OTHER): Payer: Self-pay

## 2023-08-02 VITALS — BP 174/91 | Wt 245.0 lb

## 2023-08-02 DIAGNOSIS — R519 Headache, unspecified: Secondary | ICD-10-CM

## 2023-08-02 DIAGNOSIS — J069 Acute upper respiratory infection, unspecified: Secondary | ICD-10-CM | POA: Diagnosis not present

## 2023-08-02 DIAGNOSIS — R030 Elevated blood-pressure reading, without diagnosis of hypertension: Secondary | ICD-10-CM

## 2023-08-02 DIAGNOSIS — I10 Essential (primary) hypertension: Secondary | ICD-10-CM

## 2023-08-02 MED ORDER — VALSARTAN 40 MG PO TABS
40.0000 mg | ORAL_TABLET | Freq: Every day | ORAL | 0 refills | Status: DC
  Filled 2023-08-02: qty 30, 30d supply, fill #0

## 2023-08-02 NOTE — Progress Notes (Signed)
Subjective:     Patient ID: Emily [name], female   DOB: 18-Sep-1950, 73 y.o.   MRN: [id number]  This visit type was conducted due to national recommendations for restrictions regarding the COVID-19 Pandemic (e.g. social distancing) in an effort to limit this patient's exposure and mitigate transmission in our community.  Due to their co-morbid illnesses, this patient is at least at moderate risk for complications without adequate follow up.  This format is felt to be most appropriate for this patient at this time.    Documentation for virtual audio and video telecommunications through [place] encounter:  The patient was located at home. The provider was located in the office. The patient did consent to this visit and is aware of possible charges through their insurance for this visit.  The other persons participating in this telemedicine service were husband Time spent on call was 20 minutes and in review of previous records 20 minutes total.  This virtual service is not related to other E/M service within previous 7 days.   HPI Chief Complaint  Patient presents with   sick-    Headache x 2 days, sinus pressure, sore throat, chest congestion, cough. Symptoms started 2 days ago   Virtual for illness.  She notes 2-day history of bad headache.  Also notes symptoms including headache, sinus pressure, sore throat, chest congestion, cough, ears feel full. Pain under ear and jaw.   No fever, no body aches or chills.  She notes some nausea, but no vomiting or diarrhea.  No SOB or wheezing..  She notes a history of high blood pressure diagnosed within the past year.  However she had a hospitalization in September this past year and was taken off blood pressure pills given hypotension and weakness.  She just now started back on chlorthalidone within the past week.  She has not been checking her blood pressure until yesterday and it was high  She denies numbness, tingling, weakness,  confusion, slurred speech, swelling, chest pain.  She has been around sick contacts with flu recently.  Water intake is good.    Using mucinex, benadryl, advil for pain, cough drops for symptoms.  No other aggravating or relieving factors. No other complaint.  Past Medical History:  Diagnosis Date   Acute bacterial sinusitis 06/29/2021   Acute non-recurrent ethmoidal sinusitis 08/05/2021   Acute non-recurrent pansinusitis 01/25/2022   Acute recurrent frontal sinusitis 03/08/2020   Allergy    seasonal    Anxiety    ARTHRALGIA 11/10/2009   Qualifier: Diagnosis of  By: Caryl Never MD, Bruce     Cancer [facility]) 2009   squamous cell skin   Cataract    bilateral surgery, left unsuccessful   Cough 11/23/2020   Cystitis 02/12/2020   Depression    DYSPNEA 11/10/2009   Qualifier: Diagnosis of  By: Caryl Never MD, Bruce     Eating disorder    GERD (gastroesophageal reflux disease)    Glaucoma    Head injury 07/23/2018   Hiatal hernia    History of cardiovascular stress test 04/2000   cardiolyte   History of flexible sigmoidoscopy 09/21/1999   Influenza A 03/13/2022   Other fatigue 02/26/2020   Otitis of both ears 03/08/2020   Pain of left lower extremity 03/28/2021   Preoperative evaluation of a medical condition to rule out surgical contraindications (TAR required) 10/18/2021   Shortness of breath 07/29/2022   Sinobronchitis 04/05/2021   Sinusitis 03/01/2023   Subacute cough 11/23/2020   WEIGHT GAIN 11/10/2009  Qualifier: Diagnosis of  By: Caryl Never MD, Bruce     Current Outpatient Medications on File Prior to Visit  Medication Sig Dispense Refill   albuterol (VENTOLIN HFA) 108 (90 Base) MCG/ACT inhaler Inhale 2 puffs into the lungs every 6 (six) hours as needed for wheezing or shortness of breath. 6.7 g 0   ALPRAZolam (XANAX) 1 MG tablet Take 0.5-1 tablets (0.5-1 mg total) by mouth daily as needed. *this is 90 day supply 45 tablet 0   aspirin EC 81 MG tablet Take 81 mg by mouth  daily. Swallow whole. Takes once a week     azelastine (OPTIVAR) 0.05 % ophthalmic solution Place 2 drops into both eyes 2 (two) times daily. 6 mL 5   buPROPion (WELLBUTRIN XL) 150 MG 24 hr tablet Take 1 tablet (150 mg total) by mouth every morning. 90 tablet 3   chlorthalidone (HYGROTON) 25 MG tablet Take 1 tablet (25 mg total) by mouth daily. 30 tablet 5   Cholecalciferol (VITAMIN D3) 1.25 MG (50000 UT) capsule Take 1 capsule (50,000 Units total) by mouth once a week. 4 capsule 12   fluticasone (FLONASE) 50 MCG/ACT nasal spray Place 1 spray into both nostrils daily. 16 g 2   guaiFENesin (MUCINEX) 600 MG 12 hr tablet Take 600 mg by mouth 2 (two) times daily.     meclizine (ANTIVERT) 25 MG tablet Take 1 tablet (25 mg total) by mouth 3 (three) times daily as needed for dizziness. 90 tablet 1   Netarsudil-Latanoprost (ROCKLATAN) 0.02-0.005 % SOLN INSTILL 1 DROP INTO EACH EYE AT BEDTIME 2.5 mL 11   ondansetron (ZOFRAN) 4 MG tablet Take 1 tablet (4 mg total) by mouth every 6 (six) to 8 (eight) hours as needed. 30 tablet 0   traZODone (DESYREL) 100 MG tablet Take 3 tablets (300 mg total) by mouth at bedtime. 270 tablet 3   Blood Pressure Monitor MISC Use as directed 1 each 0   No current facility-administered medications on file prior to visit.    Review of Systems As in subjective    Objective:   Physical Exam Due to coronavirus pandemic stay at home measures, patient visit was virtual and they were not examined in person.   BP (!) 174/91   Wt 245 lb (111.1 kg)   BMI 38.95 kg/m   Gen: wd, wn ,nad Otherwise not examined.       Assessment:     Encounter Diagnoses  Name Primary?   Primary hypertension    Upper respiratory tract infection, unspecified type Yes   Elevated blood pressure reading    Nonintractable headache, unspecified chronicity pattern, unspecified headache type        Plan:     We discussed her symptoms and concerns  Her symptoms suggest that she may be coming  down with a viral URI/cold.  Advised rest, hydration, can use Tylenol for fever not feeling well, can continue send Mucinex or Benadryl but avoid decongestants due to the potential for elevating blood pressure.  No obvious sign of flu although we are seeing a lot of flu in the community  We discussed her headaches and elevated blood pressure readings.  Unfortunately she does not have a lot of blood pressure readings to go on just 1 high reading yesterday was around 174/91.  Advise she monitor her blood pressure the next few days.  If her blood pressures are staying over 140/90 despite recently adding back chlorthalidone, then we we will go ahead and add back valsartan which  she was on this past year.  I did refill the valsartan  in case she needs to start this back within the next 48 hours.  We discussed other symptoms that would prompt visit to the emergency department or call 911 if any symptoms of confusion, slurred speech, severe headache, blood pressure greater than 200/100, chest pain, or other stroke or heart attack symptoms.  Daniella was seen today for sick-.  Diagnoses and all orders for this visit:  Upper respiratory tract infection, unspecified type  Primary hypertension -     valsartan (DIOVAN) 40 MG tablet; Take 1 tablet (40 mg total) by mouth daily.  Elevated blood pressure reading  Nonintractable headache, unspecified chronicity pattern, unspecified headache type    Follow-up next week in person

## 2023-08-08 NOTE — Telephone Encounter (Signed)
 Emily [name], are you ok with [place] refilling her Flonase under your name? She's a former [facility] patient. I've sent her a MyChart message to call our office to get established with another provider since she has not been seen since 09/21/22.

## 2023-08-10 DIAGNOSIS — H353221 Exudative age-related macular degeneration, left eye, with active choroidal neovascularization: Secondary | ICD-10-CM | POA: Diagnosis not present

## 2023-08-21 DIAGNOSIS — H401132 Primary open-angle glaucoma, bilateral, moderate stage: Secondary | ICD-10-CM | POA: Diagnosis not present

## 2023-08-21 DIAGNOSIS — H353113 Nonexudative age-related macular degeneration, right eye, advanced atrophic without subfoveal involvement: Secondary | ICD-10-CM | POA: Diagnosis not present

## 2023-08-21 DIAGNOSIS — H43812 Vitreous degeneration, left eye: Secondary | ICD-10-CM | POA: Diagnosis not present

## 2023-08-21 DIAGNOSIS — H353212 Exudative age-related macular degeneration, right eye, with inactive choroidal neovascularization: Secondary | ICD-10-CM | POA: Diagnosis not present

## 2023-08-21 DIAGNOSIS — H35412 Lattice degeneration of retina, left eye: Secondary | ICD-10-CM | POA: Diagnosis not present

## 2023-08-21 DIAGNOSIS — H353221 Exudative age-related macular degeneration, left eye, with active choroidal neovascularization: Secondary | ICD-10-CM | POA: Diagnosis not present

## 2023-08-21 DIAGNOSIS — H31091 Other chorioretinal scars, right eye: Secondary | ICD-10-CM | POA: Diagnosis not present

## 2023-08-24 ENCOUNTER — Other Ambulatory Visit (HOSPITAL_BASED_OUTPATIENT_CLINIC_OR_DEPARTMENT_OTHER): Payer: Self-pay

## 2023-08-24 DIAGNOSIS — Z08 Encounter for follow-up examination after completed treatment for malignant neoplasm: Secondary | ICD-10-CM | POA: Diagnosis not present

## 2023-08-24 DIAGNOSIS — R202 Paresthesia of skin: Secondary | ICD-10-CM | POA: Diagnosis not present

## 2023-08-24 DIAGNOSIS — L570 Actinic keratosis: Secondary | ICD-10-CM | POA: Diagnosis not present

## 2023-08-24 DIAGNOSIS — L821 Other seborrheic keratosis: Secondary | ICD-10-CM | POA: Diagnosis not present

## 2023-08-24 DIAGNOSIS — D225 Melanocytic nevi of trunk: Secondary | ICD-10-CM | POA: Diagnosis not present

## 2023-08-24 DIAGNOSIS — D485 Neoplasm of uncertain behavior of skin: Secondary | ICD-10-CM | POA: Diagnosis not present

## 2023-08-24 DIAGNOSIS — Z85828 Personal history of other malignant neoplasm of skin: Secondary | ICD-10-CM | POA: Diagnosis not present

## 2023-08-24 DIAGNOSIS — L814 Other melanin hyperpigmentation: Secondary | ICD-10-CM | POA: Diagnosis not present

## 2023-08-25 ENCOUNTER — Other Ambulatory Visit (HOSPITAL_BASED_OUTPATIENT_CLINIC_OR_DEPARTMENT_OTHER): Payer: Self-pay

## 2023-08-27 ENCOUNTER — Other Ambulatory Visit (HOSPITAL_BASED_OUTPATIENT_CLINIC_OR_DEPARTMENT_OTHER): Payer: Self-pay

## 2023-08-27 MED ORDER — ROCKLATAN 0.02-0.005 % OP SOLN
1.0000 [drp] | Freq: Every day | OPHTHALMIC | 11 refills | Status: AC
  Filled 2023-08-27: qty 2.5, 25d supply, fill #0
  Filled 2023-09-16: qty 2.5, 25d supply, fill #1
  Filled 2023-11-01: qty 2.5, 25d supply, fill #2
  Filled 2024-01-15: qty 2.5, 25d supply, fill #3

## 2023-08-30 ENCOUNTER — Other Ambulatory Visit (HOSPITAL_BASED_OUTPATIENT_CLINIC_OR_DEPARTMENT_OTHER): Payer: Self-pay

## 2023-09-10 NOTE — Progress Notes (Signed)
 Shawna Clamp, DNP, AGNP-c [facility] [address] [place], [place] [address] Main Office [phone]  BP 132/84   Pulse 88   Ht 5' 6.5" (1.689 m)   Wt 251 lb 12.8 oz (114.2 kg)   BMI 40.03 kg/m    Subjective:    Patient ID: Emily [name], female    DOB: 1951-06-06, 73 y.o.   MRN: [id number] Discussed the use of AI scribe software for clinical note transcription with the patient, who gave verbal consent to proceed.   HPI: Emily [name] is a 73 y.o. female presenting on 09/11/2023 for comprehensive medical examination.   History of Present Illness [name] is a 73 year old female here for her annual exam.   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 She has undergone several series of intravitreal injections to stop bleeding in the eye, which were successful in halting the bleeding but did not improve her vision. Her current level of vision is likely the best she will achieve, and the condition may continue to worsen per her eye doctor.  She is concerned about her weight. We discussed a recent loss of five pounds since September and seven pounds since June. She attributes this to avoiding the community candy dish at work and eating only until she is no longer hungry. She denies skipping meals and reports no regular exercise due to knee pain, which has persisted for about six weeks. She is cautious about using medications for weight loss due to past experiences with extreme dieting and unhealthy weight management including the use of laxatives and diuretics to achieve her weight loss goals.   Her medication regimen includes alprazolam as needed for anxiety, trazodone, and bupropion. She uses alprazolam infrequently, only when she cannot calm down.    She mentions a past reluctance to take blood pressure medications due to a fall in  September, which she feels may have been related to taking valsartan and chlorthalidone simultaneously. She was told by the providers in the ED to stop the medication with no instructions on restarting. She has not been taking her blood pressure regularly due to a broken cuff, but her blood pressure was elevated into the 160's-170's during the month of February while off of her medication.   She experiences headaches which she attributes to receiving eye injections and sinus issues. She has had an increase in sinus pressure recently with concerns for sinusitis. She has been using Mucinex to manage these symptoms, which she finds helpful.  She reports experiencing fatigue and occasional shortness of breath, which she associates with anxiety. No changes in bowel or bladder habits. No chest pain or dizziness.   Pertinent items are noted in HPI.    Most Recent Depression Screen:     09/11/2023    1:32 PM 05/29/2023    3:00 PM 10/14/2021    9:33 AM 10/14/2021    9:00 AM 01/06/2021    2:45 PM  Depression screen PHQ 2/9  Decreased Interest 0 0 1 1 3   Down, Depressed, Hopeless 0 1 1 0 3  PHQ - 2 Score 0 1 2 1 6   Altered sleeping 1 0 0  2  Tired, decreased energy 2 3 0  3  Change in appetite 0 0 2  2  Feeling bad or failure about yourself  1 0 0  0  Trouble concentrating 1 0 1  1  Moving  slowly or fidgety/restless 0 0 0  3  Suicidal thoughts 0 0 0  0  PHQ-9 Score 5 4 5  17   Difficult doing work/chores Not difficult at all Not difficult at all Somewhat difficult  Very difficult   Most Recent Anxiety Screen:      No data to display         Most Recent Fall Screen:    09/11/2023    1:32 PM 07/12/2023   11:39 AM 05/29/2023    2:59 PM 01/24/2022   12:02 PM 10/14/2021    9:00 AM  Fall Risk   Falls in the past year? 1 0 1 0 0  Comment   legs gave out    Number falls in past yr: 0 0 0 0 0  Injury with Fall? 0 0 0 0 0  Risk for fall due to : No Fall Risks No Fall Risks Medication side  effect;Impaired mobility;Impaired balance/gait  No Fall Risks  Follow up Falls evaluation completed  Falls prevention discussed;Falls evaluation completed Falls evaluation completed;Education provided Falls evaluation completed;Education provided    Past medical history, surgical history, medications, allergies, family history and social history reviewed with patient today and changes made to appropriate areas of the chart.  Past Medical History:  Past Medical History:  Diagnosis Date   Acute bacterial sinusitis 06/29/2021   Acute non-recurrent ethmoidal sinusitis 08/05/2021   Acute non-recurrent pansinusitis 01/25/2022   Acute recurrent frontal sinusitis 03/08/2020   Allergy    seasonal    Anxiety    ARTHRALGIA 11/10/2009   Qualifier: Diagnosis of  By: Caryl Never MD, Bruce     Cancer [facility]) 2009   squamous cell skin   Cataract    bilateral surgery, left unsuccessful   Cough 11/23/2020   Cystitis 02/12/2020   Depression    DYSPNEA 11/10/2009   Qualifier: Diagnosis of  By: Caryl Never MD, Bruce     Eating disorder    GERD (gastroesophageal reflux disease)    Glaucoma    Head injury 07/23/2018   Hiatal hernia    History of cardiovascular stress test 04/2000   cardiolyte   History of flexible sigmoidoscopy 09/21/1999   Influenza A 03/13/2022   Other fatigue 02/26/2020   Otitis of both ears 03/08/2020   Pain of left lower extremity 03/28/2021   Preoperative evaluation of a medical condition to rule out surgical contraindications (TAR required) 10/18/2021   Shortness of breath 07/29/2022   Sinobronchitis 04/05/2021   Sinusitis 03/01/2023   SIRS (systemic inflammatory response syndrome) vs sepsis d/t COVID 03/01/2023   Subacute cough 11/23/2020   WEIGHT GAIN 11/10/2009   Qualifier: Diagnosis of  By: Caryl Never MD, Bruce     Medications:  Current Outpatient Medications on File Prior to Visit  Medication Sig   albuterol (VENTOLIN HFA) 108 (90 Base) MCG/ACT inhaler Inhale 2 puffs  into the lungs every 6 (six) hours as needed for wheezing or shortness of breath.   ALPRAZolam (XANAX) 1 MG tablet Take 0.5-1 tablets (0.5-1 mg total) by mouth daily as needed. *this is 90 day supply   aspirin EC 81 MG tablet Take 81 mg by mouth daily. Swallow whole. Takes once a week   azelastine (OPTIVAR) 0.05 % ophthalmic solution Place 2 drops into both eyes 2 (two) times daily.   Blood Pressure Monitor MISC Use as directed   Cholecalciferol (VITAMIN D3) 1.25 MG (50000 UT) capsule Take 1 capsule (50,000 Units total) by mouth once a week.   fluticasone (FLONASE) 50 MCG/ACT  nasal spray Place 1 spray into both nostrils daily.   guaiFENesin (MUCINEX) 600 MG 12 hr tablet Take 600 mg by mouth 2 (two) times daily.   meclizine (ANTIVERT) 25 MG tablet Take 1 tablet (25 mg total) by mouth 3 (three) times daily as needed for dizziness.   Netarsudil-Latanoprost (ROCKLATAN) 0.02-0.005 % SOLN Place 1 drop into both eyes at bedtime.   No current facility-administered medications on file prior to visit.   Surgical History:  Past Surgical History:  Procedure Laterality Date   ABDOMINAL HYSTERECTOMY     APPENDECTOMY     CARDIAC CATHETERIZATION  04/2000   CARDIAC CATHETERIZATION     COLONOSCOPY  12/31/09   UPPER GASTROINTESTINAL ENDOSCOPY  09/15/1999   WISDOM TOOTH EXTRACTION     Allergies:  Allergies  Allergen Reactions   Doxycycline     Gi upset   Family History:  Family History  Problem Relation Age of Onset   Arthritis Other    Cancer Other        lung   Coronary artery disease Other    Stroke Other    Colon cancer Neg Hx    Colon polyps Neg Hx    Esophageal cancer Neg Hx    Rectal cancer Neg Hx    Stomach cancer Neg Hx        Objective:    BP 132/84   Pulse 88   Ht 5' 6.5" (1.689 m)   Wt 251 lb 12.8 oz (114.2 kg)   BMI 40.03 kg/m   Wt Readings from Last 3 Encounters:  09/11/23 251 lb 12.8 oz (114.2 kg)  08/02/23 245 lb (111.1 kg)  07/12/23 250 lb (113.4 kg)    Physical  Exam Vitals and nursing note reviewed.  Constitutional:      General: She is not in acute distress.    Appearance: Normal appearance.  HENT:     Head: Normocephalic and atraumatic.     Right Ear: Hearing, tympanic membrane, ear canal and external ear normal.     Left Ear: Hearing, tympanic membrane, ear canal and external ear normal.     Nose: Congestion present.     Right Sinus: Maxillary sinus tenderness and frontal sinus tenderness present.     Left Sinus: Maxillary sinus tenderness and frontal sinus tenderness present.     Mouth/Throat:     Lips: Pink.     Mouth: Mucous membranes are moist.     Pharynx: Oropharynx is clear. Posterior oropharyngeal erythema present.  Eyes:     General: Lids are normal. Vision grossly intact.     Conjunctiva/sclera: Conjunctivae normal.     Funduscopic exam:    Right eye: Red reflex present.        Left eye: Red reflex present.    Visual Fields: Right eye visual fields normal and left eye visual fields normal.  Neck:     Thyroid: No thyromegaly.     Vascular: No carotid bruit.  Cardiovascular:     Rate and Rhythm: Normal rate and regular rhythm.     Chest Wall: PMI is not displaced.     Pulses: Normal pulses.          Dorsalis pedis pulses are 2+ on the right side and 2+ on the left side.       Posterior tibial pulses are 2+ on the right side and 2+ on the left side.     Heart sounds: Normal heart sounds. No murmur heard. Pulmonary:     Effort:  Pulmonary effort is normal. No respiratory distress.     Breath sounds: Normal breath sounds.  Abdominal:     General: Abdomen is flat. Bowel sounds are normal. There is no distension.     Palpations: Abdomen is soft. There is no hepatomegaly, splenomegaly or mass.     Tenderness: There is no abdominal tenderness. There is no right CVA tenderness, left CVA tenderness, guarding or rebound.  Musculoskeletal:        General: Normal range of motion.     Cervical back: Full passive range of motion  without pain, normal range of motion and neck supple. No tenderness.     Right lower leg: Edema present.     Left lower leg: Edema present.     Comments: Non-pitting mild edema noted.   Feet:     Left foot:     Toenail Condition: Left toenails are normal.  Lymphadenopathy:     Cervical: No cervical adenopathy.     Upper Body:     Right upper body: No supraclavicular adenopathy.     Left upper body: No supraclavicular adenopathy.  Skin:    General: Skin is warm and dry.     Capillary Refill: Capillary refill takes less than 2 seconds.     Nails: There is no clubbing.  Neurological:     General: No focal deficit present.     Mental Status: She is alert and oriented to person, place, and time.     GCS: GCS eye subscore is 4. GCS verbal subscore is 5. GCS motor subscore is 6.     Sensory: Sensation is intact.     Motor: Motor function is intact.     Coordination: Coordination is intact.     Gait: Gait is intact.     Deep Tendon Reflexes: Reflexes are normal and symmetric.  Psychiatric:        Attention and Perception: Attention normal.        Mood and Affect: Mood normal.        Speech: Speech normal.        Behavior: Behavior normal. Behavior is cooperative.        Thought Content: Thought content normal.        Cognition and Memory: Cognition and memory normal.        Judgment: Judgment normal.     Results for orders placed or performed in visit on 09/11/23  CBC with Differential/Platelet   Collection Time: 09/11/23  2:35 PM  Result Value Ref Range   WBC 6.2 3.4 - 10.8 x10E3/uL   RBC 4.79 3.77 - 5.28 x10E6/uL   Hemoglobin 14.7 11.1 - 15.9 g/dL   Hematocrit 40.9 81.1 - 46.6 %   MCV 91 79 - 97 fL   MCH 30.7 26.6 - 33.0 pg   MCHC 33.6 31.5 - 35.7 g/dL   RDW 91.4 78.2 - 95.6 %   Platelets 266 150 - 450 x10E3/uL   Neutrophils 63 Not Estab. %   Lymphs 24 Not Estab. %   Monocytes 8 Not Estab. %   Eos 3 Not Estab. %   Basos 1 Not Estab. %   Neutrophils Absolute 4.0 1.4 -  7.0 x10E3/uL   Lymphocytes Absolute 1.5 0.7 - 3.1 x10E3/uL   Monocytes Absolute 0.5 0.1 - 0.9 x10E3/uL   EOS (ABSOLUTE) 0.2 0.0 - 0.4 x10E3/uL   Basophils Absolute 0.0 0.0 - 0.2 x10E3/uL   Immature Granulocytes 1 Not Estab. %   Immature Grans (Abs) 0.0 0.0 -  0.1 x10E3/uL  CMP14+EGFR   Collection Time: 09/11/23  2:35 PM  Result Value Ref Range   Glucose 110 (H) 70 - 99 mg/dL   BUN 14 8 - 27 mg/dL   Creatinine, Ser 1.61 0.57 - 1.00 mg/dL   eGFR 67 >09 UE/AVW/0.98   BUN/Creatinine Ratio 15 12 - 28   Sodium 142 134 - 144 mmol/L   Potassium 4.2 3.5 - 5.2 mmol/L   Chloride 104 96 - 106 mmol/L   CO2 24 20 - 29 mmol/L   Calcium 9.2 8.7 - 10.3 mg/dL   Total Protein 6.4 6.0 - 8.5 g/dL   Albumin 4.2 3.8 - 4.8 g/dL   Globulin, Total 2.2 1.5 - 4.5 g/dL   Bilirubin Total 0.3 0.0 - 1.2 mg/dL   Alkaline Phosphatase 59 44 - 121 IU/L   AST 16 0 - 40 IU/L   ALT 19 0 - 32 IU/L  Hemoglobin A1c   Collection Time: 09/11/23  2:35 PM  Result Value Ref Range   Hgb A1c MFr Bld 5.4 4.8 - 5.6 %   Est. average glucose Bld gHb Est-mCnc 108 mg/dL       Assessment & Plan:   Problem List Items Addressed This Visit     Depression, recurrent (HCC)   Appears well controlled with no alarm symptoms present. Will refill medications. Continue to use alprazolam very sparingly. I am fine taking over her medications at this time given that she has been stable and there are no new symptoms. She is aware that if there are great changes, I may need to consult with psychiatry in the future.       Relevant Medications   traZODone (DESYREL) 100 MG tablet   buPROPion (WELLBUTRIN XL) 150 MG 24 hr tablet   Hepatic steatosis   Weight is a concern for her, particularly with a history of fatty liver. No alarm symptoms at this time. Work on diet and exercise to help with weight loss. We can consider medication options in the future if this is unsuccessful, but will need to monitor very closely due to history of excess  dieting.       Relevant Orders   CMP14+EGFR (Completed)   Hypertension   BP is slightly elevated today. She expresses concerns with diuretic use due to history of previous overuse for weight management. This is understandable. We will plan to continue with valsartan. Daily use encouraged. If BP remains elevated, we can consider increasing the dose.       Relevant Medications   valsartan (DIOVAN) 40 MG tablet   Other Relevant Orders   CBC with Differential/Platelet (Completed)   CMP14+EGFR (Completed)   Hemoglobin A1c (Completed)   Generalized anxiety disorder   Well managed. Alprazolam only used intermittently. I will take over management of her medications at this time.       Relevant Medications   traZODone (DESYREL) 100 MG tablet   buPROPion (WELLBUTRIN XL) 150 MG 24 hr tablet   Decreased calculated glomerular filtration rate (GFR)   Repeat labs today.       Relevant Orders   CBC with Differential/Platelet (Completed)   CMP14+EGFR (Completed)   Pre-diabetes   Repeat labs today. Diet and exercise recommendations discussed.       Relevant Orders   CBC with Differential/Platelet (Completed)   CMP14+EGFR (Completed)   Hemoglobin A1c (Completed)   Encounter for annual physical exam - Primary   CPE completed today. Review of HM activities and recommendations discussed and provided on AVS.  Anticipatory guidance, diet, and exercise recommendations provided. Medications, allergies, and hx reviewed and updated as necessary. Orders placed as listed below.  Plan: - Labs ordered. Will make changes as necessary based on results.  - I will review these results and send recommendations via MyChart or a telephone call.  - F/U with CPE in 1 year or sooner for acute/chronic health needs as directed.        Acute non-recurrent pansinusitis   She reports frequent headaches and sinus drainage, possibly allergy-related. Mucinex provides some relief. Suspect sinus infection; plan to treat  with antibiotics and antihistamines. Headaches localized to the sinus area suggest sinus involvement. - Prescribe azithromycin for sinus infection - Prescribe levocetirizine for allergy management      Relevant Medications   azithromycin (ZITHROMAX) 250 MG tablet   levocetirizine (XYZAL) 5 MG tablet   Class 3 severe obesity due to excess calories with serious comorbidity and body mass index (BMI) of 40.0 to 44.9 in adult [facility])   Discussion on healthy weight management techniques including high protein and low carbohydrate dietary options coupled with routine exercise. She has lost 7 lbs in the last several months by reducing her intake of candy and monitoring her portion sizes. We discussed medications that may help, but with her history of obsessive dieting and unhealthy weight loss practices, she would like to avoid this. Emphasized balanced nutrition, adequate protein intake, and encouraged regular exercise once her knees improve. Strong emphasis on sustainable approach to weight loss to avoid yo-yo weight and unhealthy dieting practices. Will follow closely.       Relevant Orders   CBC with Differential/Platelet (Completed)   CMP14+EGFR (Completed)   Hemoglobin A1c (Completed)   Mixed hyperlipidemia   Repeat labs for monitoring and evaluation.       Relevant Medications   valsartan (DIOVAN) 40 MG tablet   Other Relevant Orders   CBC with Differential/Platelet (Completed)   CMP14+EGFR (Completed)   Hemoglobin A1c (Completed)   Seasonal allergic rhinitis due to pollen   Increased sinus pain and pressure with recurrent sinusitis noted during certain seasons. Recent sharp increase in pollen count likely a contributor to her current symptoms. Will go ahead and send treatment with azithromycin for sinusitis given the tenderness in her sinuses and symptoms. Recommend starting Xyzal daily at bedtime to aid in symptom management and prevention in future sinus infections related to allergy  mucous production.       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 Intravitreal injections have halted bleeding but not improved vision. The condition is expected to worsen, and she has been informed that her current vision is likely the best it will be. I will continue to follow and provide supportive care.          Follow up plan: Return in about 6 weeks (around 10/23/2023) for Med Management 30- blood pressure update- ok if virtual.  NEXT PREVENTATIVE PHYSICAL DUE IN 1 YEAR.  PATIENT COUNSELING PROVIDED FOR ALL ADULT PATIENTS: A well balanced diet low in saturated fats, cholesterol, and moderation in carbohydrates.  This can be as simple as monitoring portion sizes and cutting back on sugary beverages such as soda and juice to start with.    Daily water consumption of at least 64 ounces.  Physical activity at least 180 minutes per week.  If just starting out, start 10  minutes a day and work your way up.   This can be as simple as taking the stairs instead of the elevator and walking 2-3 laps around the office  purposefully every day.   STD protection, partner selection, and regular testing if high risk.  Limited consumption of alcoholic beverages if alcohol is consumed. For men, I recommend no more than 14 alcoholic beverages per week, spread out throughout the week (max 2 per day). Avoid "binge" drinking or consuming large quantities of alcohol in one setting.  Please let me know if you feel you may need help with reduction or quitting alcohol consumption.   Avoidance of nicotine, if used. Please let me know if you feel you may need help with reduction or quitting nicotine use.   Daily mental health attention. This can be in the form of 5 minute daily meditation, prayer, journaling, yoga, reflection, etc.  Purposeful attention to  your emotions and mental state can significantly improve your overall wellbeing  and  Health.  Please know that I am here to help you with all of your health care goals and am happy to work with you to find a solution that works best for you.  The greatest advice I have received with any changes in life are to take it one step at a time, that even means if all you can focus on is the next 60 seconds, then do that and celebrate your victories.  With any changes in life, you will have set backs, and that is OK. The important thing to remember is, if you have a set back, it is not a failure, it is an opportunity to try again! Screening Testing Mammogram Every 1 -2 years based on history and risk factors Starting at age 80 Pap Smear Ages 21-39 every 3 years Ages 46-65 every 5 years with HPV testing More frequent testing may be required based on results and history Colon Cancer Screening Every 1-10 years based on test performed, risk factors, and history Starting at age 16 Bone Density Screening Every 2-10 years based on history Starting at age 15 for women Recommendations for men differ based on medication usage, history, and risk factors AAA Screening One time ultrasound Men 31-37 years old who have every smoked Lung Cancer Screening Low Dose Lung CT every 12 months Age 46-80 years with a 30 pack-year smoking history who still smoke or who have quit within the last 15 years   Screening Labs Routine  Labs: Complete Blood Count (CBC), Complete Metabolic Panel (CMP), Cholesterol (Lipid Panel) Every 6-12 months based on history and medications May be recommended more frequently based on current conditions or previous results Hemoglobin A1c Lab Every 3-12 months based on history and previous results Starting at age 82 or earlier with diagnosis of diabetes, high cholesterol, BMI >26, and/or risk factors Frequent monitoring for patients with diabetes to ensure blood sugar control Thyroid  Panel (TSH) Every 6 months based on history, symptoms, and risk factors May be repeated more often if on medication HIV One time testing for all patients 65 and older May be repeated more frequently for patients with increased risk factors or exposure Hepatitis C One time testing for all patients 9 and older May be repeated more frequently for patients with increased risk factors or exposure Gonorrhea, Chlamydia Every 12 months for all sexually active persons 13-24 years Additional monitoring may be recommended for those who are considered high risk or who have symptoms Every 12 months for any woman  on birth control, regardless of sexual activity PSA Men 53-94 years old with risk factors Additional screening may be recommended from age 19-69 based on risk factors, symptoms, and history  Vaccine Recommendations Tetanus Booster All adults every 10 years Flu Vaccine All patients 6 months and older every year COVID Vaccine All patients 12 years and older Initial dosing with booster May recommend additional booster based on age and health history HPV Vaccine 2 doses all patients age 59-26 Dosing may be considered for patients over 26 Shingles Vaccine (Shingrix) 2 doses all adults 55 years and older Pneumonia (Pneumovax 86) All adults 65 years and older May recommend earlier dosing based on health history One year apart from Prevnar 70 Pneumonia (Prevnar 24) All adults 65 years and older Dosed 1 year after Pneumovax 23 Pneumonia (Prevnar 20) One time alternative to the two dosing of 13 and 23 For all adults with initial dose of 23, 20 is recommended 1 year later For all adults with initial dose of 13, 23 is still recommended as second option 1 year later

## 2023-09-11 ENCOUNTER — Ambulatory Visit: Payer: PPO | Admitting: Nurse Practitioner

## 2023-09-11 ENCOUNTER — Other Ambulatory Visit (HOSPITAL_BASED_OUTPATIENT_CLINIC_OR_DEPARTMENT_OTHER): Payer: Self-pay

## 2023-09-11 ENCOUNTER — Encounter: Payer: Self-pay | Admitting: Nurse Practitioner

## 2023-09-11 VITALS — BP 132/84 | HR 88 | Ht 66.5 in | Wt 251.8 lb

## 2023-09-11 DIAGNOSIS — R944 Abnormal results of kidney function studies: Secondary | ICD-10-CM | POA: Diagnosis not present

## 2023-09-11 DIAGNOSIS — J301 Allergic rhinitis due to pollen: Secondary | ICD-10-CM

## 2023-09-11 DIAGNOSIS — J0140 Acute pansinusitis, unspecified: Secondary | ICD-10-CM

## 2023-09-11 DIAGNOSIS — F339 Major depressive disorder, recurrent, unspecified: Secondary | ICD-10-CM | POA: Diagnosis not present

## 2023-09-11 DIAGNOSIS — F411 Generalized anxiety disorder: Secondary | ICD-10-CM

## 2023-09-11 DIAGNOSIS — I10 Essential (primary) hypertension: Secondary | ICD-10-CM

## 2023-09-11 DIAGNOSIS — E782 Mixed hyperlipidemia: Secondary | ICD-10-CM | POA: Diagnosis not present

## 2023-09-11 DIAGNOSIS — R7303 Prediabetes: Secondary | ICD-10-CM

## 2023-09-11 DIAGNOSIS — H353220 Exudative age-related macular degeneration, left eye, stage unspecified: Secondary | ICD-10-CM | POA: Diagnosis not present

## 2023-09-11 DIAGNOSIS — Z0000 Encounter for general adult medical examination without abnormal findings: Secondary | ICD-10-CM | POA: Diagnosis not present

## 2023-09-11 DIAGNOSIS — E66813 Obesity, class 3: Secondary | ICD-10-CM | POA: Diagnosis not present

## 2023-09-11 DIAGNOSIS — K760 Fatty (change of) liver, not elsewhere classified: Secondary | ICD-10-CM

## 2023-09-11 DIAGNOSIS — Z6841 Body Mass Index (BMI) 40.0 and over, adult: Secondary | ICD-10-CM | POA: Diagnosis not present

## 2023-09-11 MED ORDER — VALSARTAN 40 MG PO TABS
40.0000 mg | ORAL_TABLET | Freq: Every day | ORAL | 1 refills | Status: DC
  Filled 2023-09-11: qty 90, 90d supply, fill #0
  Filled 2024-01-15: qty 90, 90d supply, fill #1

## 2023-09-11 MED ORDER — TRAZODONE HCL 100 MG PO TABS
300.0000 mg | ORAL_TABLET | Freq: Every day | ORAL | 3 refills | Status: AC
  Filled 2023-09-11: qty 270, 90d supply, fill #0
  Filled 2023-12-20: qty 90, 30d supply, fill #0
  Filled 2024-01-15: qty 90, 30d supply, fill #1
  Filled 2024-03-04: qty 90, 30d supply, fill #2
  Filled 2024-04-16: qty 90, 30d supply, fill #3
  Filled 2024-05-20: qty 90, 30d supply, fill #4
  Filled 2024-06-21: qty 90, 30d supply, fill #5

## 2023-09-11 MED ORDER — LEVOCETIRIZINE DIHYDROCHLORIDE 5 MG PO TABS
5.0000 mg | ORAL_TABLET | Freq: Every evening | ORAL | 3 refills | Status: DC
Start: 2023-09-11 — End: 2024-01-15
  Filled 2023-09-11: qty 30, 30d supply, fill #0
  Filled 2023-10-07: qty 30, 30d supply, fill #1
  Filled 2023-11-01: qty 30, 30d supply, fill #2
  Filled 2023-12-20: qty 30, 30d supply, fill #3

## 2023-09-11 MED ORDER — BUPROPION HCL ER (XL) 150 MG PO TB24
150.0000 mg | ORAL_TABLET | Freq: Every morning | ORAL | 3 refills | Status: AC
  Filled 2023-09-11 – 2024-06-27 (×2): qty 90, 90d supply, fill #0

## 2023-09-11 MED ORDER — AZITHROMYCIN 250 MG PO TABS
ORAL_TABLET | ORAL | 0 refills | Status: AC
Start: 2023-09-11 — End: 2023-09-17
  Filled 2023-09-11: qty 6, 5d supply, fill #0

## 2023-09-11 NOTE — Assessment & Plan Note (Signed)
 Repeat labs for monitoring and evaluation.

## 2023-09-11 NOTE — Assessment & Plan Note (Signed)
 She reports frequent headaches and sinus drainage, possibly allergy-related. Mucinex provides some relief. Suspect sinus infection; plan to treat with antibiotics and antihistamines. Headaches localized to the sinus area suggest sinus involvement. - Prescribe azithromycin for sinus infection - Prescribe levocetirizine for allergy management

## 2023-09-11 NOTE — Assessment & Plan Note (Signed)
 Increased sinus pain and pressure with recurrent sinusitis noted during certain seasons. Recent sharp increase in pollen count likely a contributor to her current symptoms. Will go ahead and send treatment with azithromycin for sinusitis given the tenderness in her sinuses and symptoms. Recommend starting Xyzal daily at bedtime to aid in symptom management and prevention in future sinus infections related to allergy mucous production.

## 2023-09-11 NOTE — Patient Instructions (Addendum)
 You can check [facility] to see if there are replacement parts for the blood pressure cuff.   For your weight loss, I would like you to think of a goal, either size or weight and work towards that.   I have sent in the valsartan for you to restart and the refills Wellbutrin and Trazodone. When you need a refill of the alprazolam please let me know .    WEIGHT LOSS PLANNING  For best management of weight, it is vital to balance intake versus output. This means the number of calories burned per day must be less than the calories you take in with food and drink.   I recommend trying to follow a diet with the following: Calories: 1200-1500 calories per day Carbohydrates: 150-180 grams of carbohydrates per day  Why: Gives your body enough "quick fuel" for cells to maintain normal function without sending them into starvation mode.  Protein: At least 90 grams of protein per day- 30 grams with each meal Why: Protein takes longer and uses more energy than carbohydrates to break down for fuel. The carbohydrates in your meals serves as quick energy sources and proteins help use some of that extra quick energy to break down to produce long term energy. This helps you not feel hungry as quickly and protein breakdown burns calories.  Water: Drink AT LEAST 64 ounces of water per day  Why: Water is essential to healthy metabolism. Water helps to fill the stomach and keep you fuller longer. Water is required for healthy digestion and filtering of waste in the body.  Fat: Limit fats in your diet- when choosing fats, choose foods with lower fats content such as lean meats (chicken, fish, [place]).  Why: Increased fat intake leads to storage "for later". Once you burn your carbohydrate energy, your body goes into fat and protein breakdown mode to help you loose weight.  Cholesterol: Fats and oils that are LIQUID at room temperature are best. Choose vegetable oils (olive oil, avocado oil, nuts). Avoid fats that are  SOLID at room temperature (animal fats, processed meats). Healthy fats are often found in whole grains, beans, nuts, seeds, and berries.  Why: Elevated cholesterol levels lead to build up of cholesterol on the inside of your blood vessels. This will eventually cause the blood vessels to become hard and can lead to high blood pressure and damage to your organs. When the blood flow is reduced, but the pressure is high from cholesterol buildup, parts of the cholesterol can break off and form clots that can go to the brain or heart leading to a stroke or heart attack.  Fiber: Increase amount of SOLUBLE the fiber in your diet. This helps to fill you up, lowers cholesterol, and helps with digestion. Some foods high in soluble fiber are oats, peas, beans, apples, carrots, barley, and citrus fruits.   Why: Fiber fills you up, helps remove excess cholesterol, and aids in healthy digestion which are all very important in weight management.   I recommend the following as a minimum activity routine: Purposeful walk or other physical activity at least 20 minutes every single day. This means purposefully taking a walk, jog, bike, swim, treadmill, elliptical, dance, etc.  This activity should be ABOVE your normal daily activities, such as walking at work. Goal exercise should be at least 150 minutes a week- work your way up to this.   Heart Rate: Your maximum exercise heart rate should be 220 - Your Age in Years. When exercising,  get your heart rate up, but avoid going over the maximum targeted heart rate.  60-70% of your maximum heart rate is where you tend to burn the most fat. To find this number:  220 - Age In Years= Max HR  Max HR x 0.6 (or 0.7) = Fat Burning HR The Fat Burning HR is your goal heart rate while working out to burn the most fat.  NEVER exercise to the point your feel lightheaded, weak, nauseated, dizzy. If you experience ANY of these symptoms- STOP exercise! Allow yourself to cool down and  your heart rate to come down. Then restart slower next time.  If at ANY TIME you feel chest pain or chest pressure during exercise, STOP IMMEDIATELY and seek medical attention.

## 2023-09-11 NOTE — Assessment & Plan Note (Signed)
 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 Intravitreal injections have halted bleeding but not improved vision. The condition is expected to worsen, and she has been informed that her current vision is likely the best it will be. I will continue to follow and provide supportive care.

## 2023-09-11 NOTE — Assessment & Plan Note (Signed)
 Discussion on healthy weight management techniques including high protein and low carbohydrate dietary options coupled with routine exercise. She has lost 7 lbs in the last several months by reducing her intake of candy and monitoring her portion sizes. We discussed medications that may help, but with her history of obsessive dieting and unhealthy weight loss practices, she would like to avoid this. Emphasized balanced nutrition, adequate protein intake, and encouraged regular exercise once her knees improve. Strong emphasis on sustainable approach to weight loss to avoid yo-yo weight and unhealthy dieting practices. Will follow closely.

## 2023-09-11 NOTE — Assessment & Plan Note (Signed)

## 2023-09-12 LAB — HEMOGLOBIN A1C
Est. average glucose Bld gHb Est-mCnc: 108 mg/dL
Hgb A1c MFr Bld: 5.4 % (ref 4.8–5.6)

## 2023-09-12 LAB — CMP14+EGFR
ALT: 19 IU/L (ref 0–32)
AST: 16 IU/L (ref 0–40)
Albumin: 4.2 g/dL (ref 3.8–4.8)
Alkaline Phosphatase: 59 IU/L (ref 44–121)
BUN/Creatinine Ratio: 15 (ref 12–28)
BUN: 14 mg/dL (ref 8–27)
Bilirubin Total: 0.3 mg/dL (ref 0.0–1.2)
CO2: 24 mmol/L (ref 20–29)
Calcium: 9.2 mg/dL (ref 8.7–10.3)
Chloride: 104 mmol/L (ref 96–106)
Creatinine, Ser: 0.91 mg/dL (ref 0.57–1.00)
Globulin, Total: 2.2 g/dL (ref 1.5–4.5)
Glucose: 110 mg/dL — ABNORMAL HIGH (ref 70–99)
Potassium: 4.2 mmol/L (ref 3.5–5.2)
Sodium: 142 mmol/L (ref 134–144)
Total Protein: 6.4 g/dL (ref 6.0–8.5)
eGFR: 67 mL/min/{1.73_m2} (ref >59)

## 2023-09-12 LAB — CBC WITH DIFFERENTIAL/PLATELET
Basophils Absolute: 0 10*3/uL (ref 0.0–0.2)
Basos: 1 %
EOS (ABSOLUTE): 0.2 10*3/uL (ref 0.0–0.4)
Eos: 3 %
Hematocrit: 43.8 % (ref 34.0–46.6)
Hemoglobin: 14.7 g/dL (ref 11.1–15.9)
Immature Grans (Abs): 0 10*3/uL (ref 0.0–0.1)
Immature Granulocytes: 1 %
Lymphocytes Absolute: 1.5 10*3/uL (ref 0.7–3.1)
Lymphs: 24 %
MCH: 30.7 pg (ref 26.6–33.0)
MCHC: 33.6 g/dL (ref 31.5–35.7)
MCV: 91 fL (ref 79–97)
Monocytes Absolute: 0.5 10*3/uL (ref 0.1–0.9)
Monocytes: 8 %
Neutrophils Absolute: 4 10*3/uL (ref 1.4–7.0)
Neutrophils: 63 %
Platelets: 266 10*3/uL (ref 150–450)
RBC: 4.79 x10E6/uL (ref 3.77–5.28)
RDW: 12.6 % (ref 11.7–15.4)
WBC: 6.2 10*3/uL (ref 3.4–10.8)

## 2023-09-16 ENCOUNTER — Encounter: Payer: Self-pay | Admitting: Nurse Practitioner

## 2023-09-16 NOTE — Assessment & Plan Note (Signed)
 Weight is a concern for her, particularly with a history of fatty liver. No alarm symptoms at this time. Work on diet and exercise to help with weight loss. We can consider medication options in the future if this is unsuccessful, but will need to monitor very closely due to history of excess dieting.

## 2023-09-16 NOTE — Assessment & Plan Note (Signed)
 Well managed. Alprazolam only used intermittently. I will take over management of her medications at this time.

## 2023-09-16 NOTE — Assessment & Plan Note (Signed)
 Repeat labs today

## 2023-09-16 NOTE — Assessment & Plan Note (Signed)
 Repeat labs today. Diet and exercise recommendations discussed.

## 2023-09-16 NOTE — Assessment & Plan Note (Signed)
 BP is slightly elevated today. She expresses concerns with diuretic use due to history of previous overuse for weight management. This is understandable. We will plan to continue with valsartan. Daily use encouraged. If BP remains elevated, we can consider increasing the dose.

## 2023-09-16 NOTE — Assessment & Plan Note (Signed)
 Appears well controlled with no alarm symptoms present. Will refill medications. Continue to use alprazolam very sparingly. I am fine taking over her medications at this time given that she has been stable and there are no new symptoms. She is aware that if there are great changes, I may need to consult with psychiatry in the future.

## 2023-09-21 DIAGNOSIS — H401131 Primary open-angle glaucoma, bilateral, mild stage: Secondary | ICD-10-CM | POA: Diagnosis not present

## 2023-09-27 ENCOUNTER — Other Ambulatory Visit (HOSPITAL_BASED_OUTPATIENT_CLINIC_OR_DEPARTMENT_OTHER): Payer: Self-pay

## 2023-09-28 ENCOUNTER — Other Ambulatory Visit (HOSPITAL_BASED_OUTPATIENT_CLINIC_OR_DEPARTMENT_OTHER): Payer: Self-pay

## 2023-09-28 MED ORDER — DORZOLAMIDE HCL 2 % OP SOLN
1.0000 [drp] | Freq: Three times a day (TID) | OPHTHALMIC | 4 refills | Status: AC
  Filled 2023-09-28: qty 10, 67d supply, fill #0
  Filled 2024-01-15: qty 30, 100d supply, fill #1
  Filled 2024-06-27: qty 30, 100d supply, fill #2

## 2023-10-10 ENCOUNTER — Other Ambulatory Visit: Payer: Self-pay | Admitting: Nurse Practitioner

## 2023-10-10 DIAGNOSIS — Z1231 Encounter for screening mammogram for malignant neoplasm of breast: Secondary | ICD-10-CM

## 2023-10-11 ENCOUNTER — Ambulatory Visit
Admission: RE | Admit: 2023-10-11 | Discharge: 2023-10-11 | Disposition: A | Discharge status: Home / Self Care | Source: Ambulatory Visit | Attending: Nurse Practitioner | Admitting: Nurse Practitioner

## 2023-10-11 DIAGNOSIS — Z1231 Encounter for screening mammogram for malignant neoplasm of breast: Secondary | ICD-10-CM | POA: Diagnosis not present

## 2023-10-17 ENCOUNTER — Encounter: Payer: Self-pay | Admitting: Nurse Practitioner

## 2023-10-18 ENCOUNTER — Other Ambulatory Visit (HOSPITAL_BASED_OUTPATIENT_CLINIC_OR_DEPARTMENT_OTHER): Payer: Self-pay

## 2023-10-30 IMAGING — MG MM DIGITAL SCREENING BILAT W/ TOMO AND CAD
8 series · 8 of 24 positions shown · non-contrast
Comparison: Previous exam(s).

ACR Breast Density Category a: The breast tissue is almost entirely
fatty.

CLINICAL DATA: Screening.

EXAM:
DIGITAL SCREENING BILATERAL MAMMOGRAM WITH TOMOSYNTHESIS AND CAD
TECHNIQUE: Bilateral screening digital craniocaudal and mediolateral oblique
mammograms were obtained. Bilateral screening digital breast
tomosynthesis was performed. The images were evaluated with
computer-aided detection.

[R MLO synth-2D]
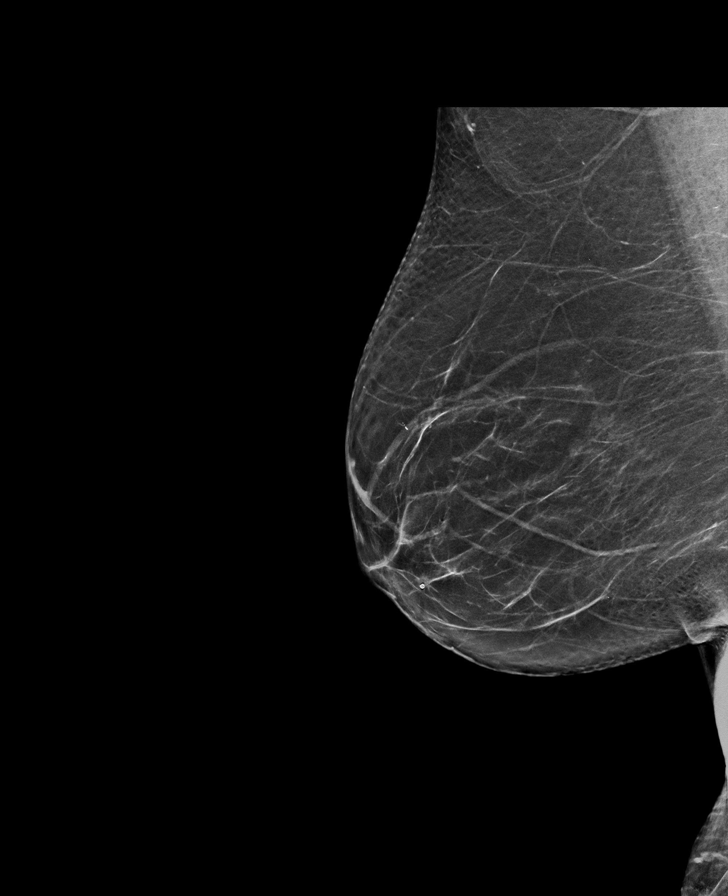

[L CC synth-2D]
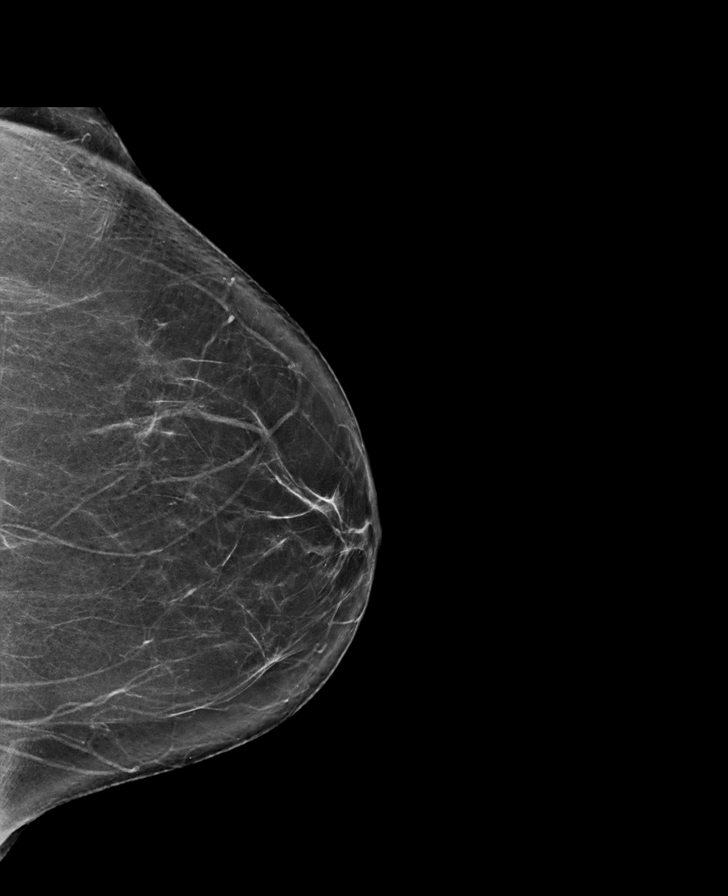

[L MLO synth-2D]
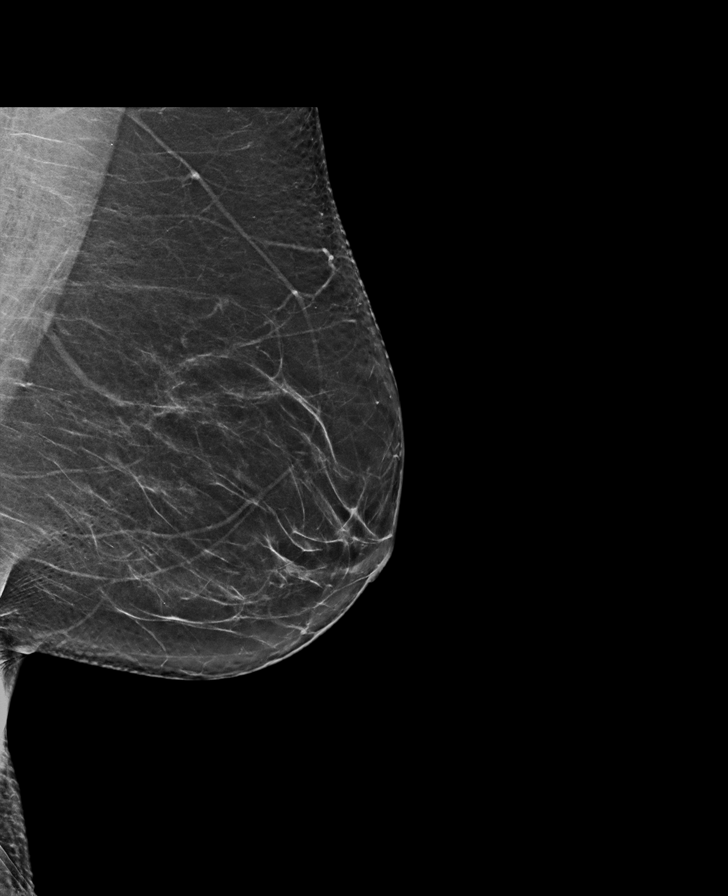

[R CC synth-2D]
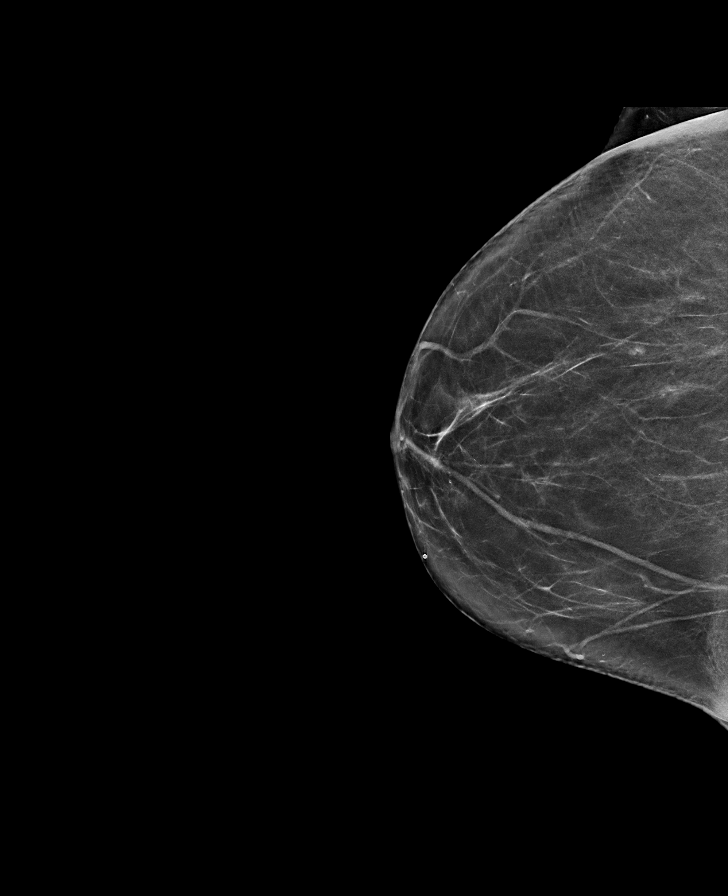

[L CC tomo · tomo slice 38/75.0]
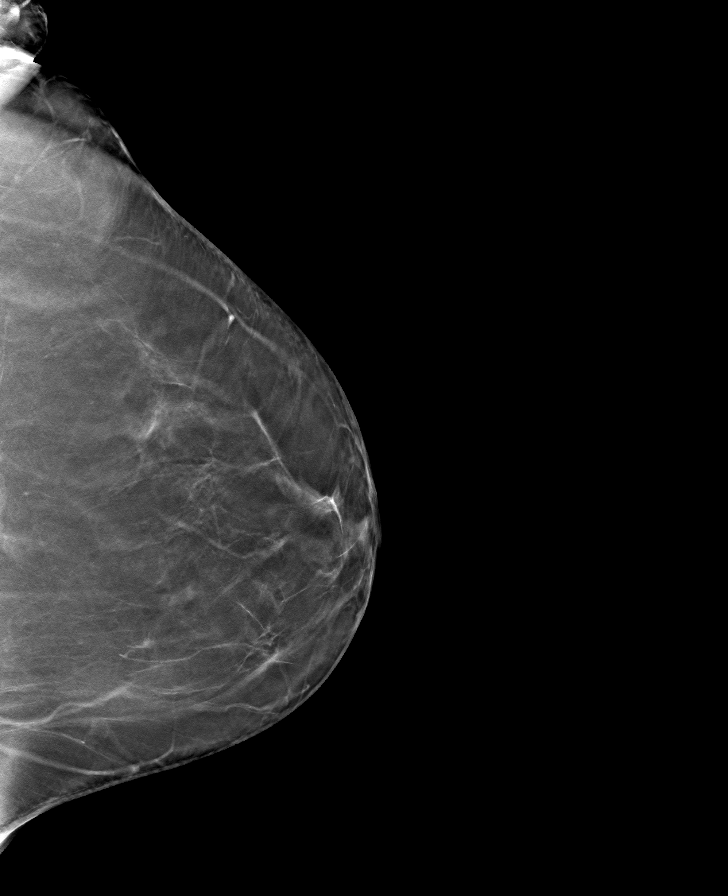

[R CC tomo · tomo slice 33/66.0]
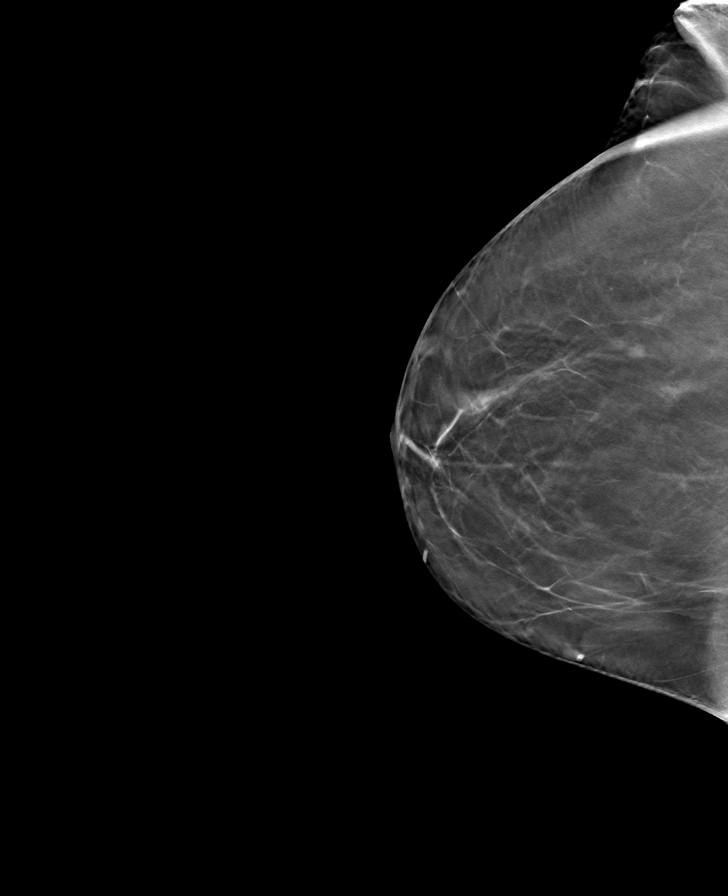

[L MLO tomo · tomo slice 37/74.0]
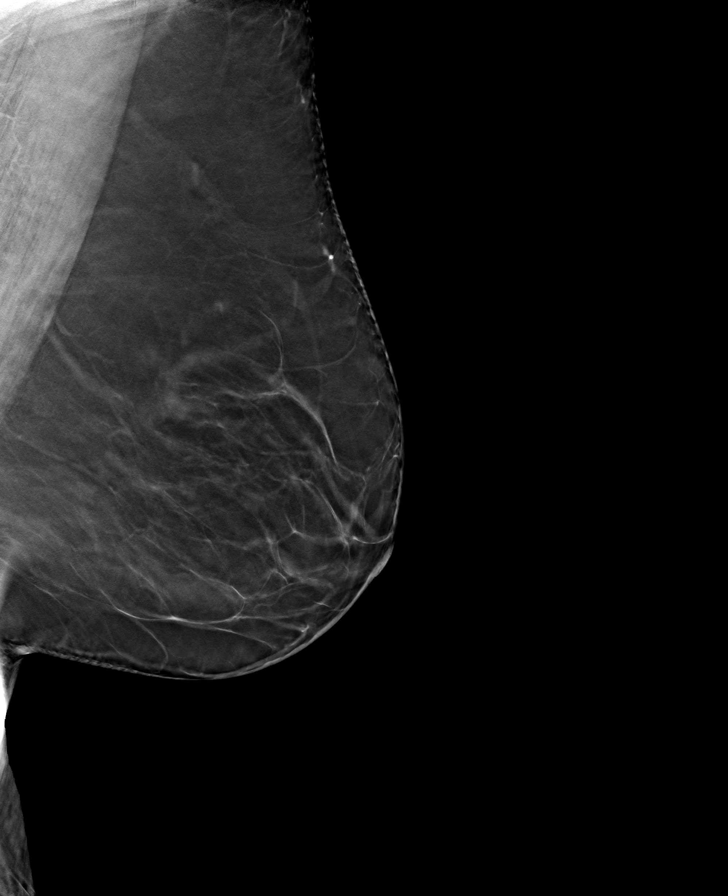

[R MLO tomo · tomo slice 36/71.0]
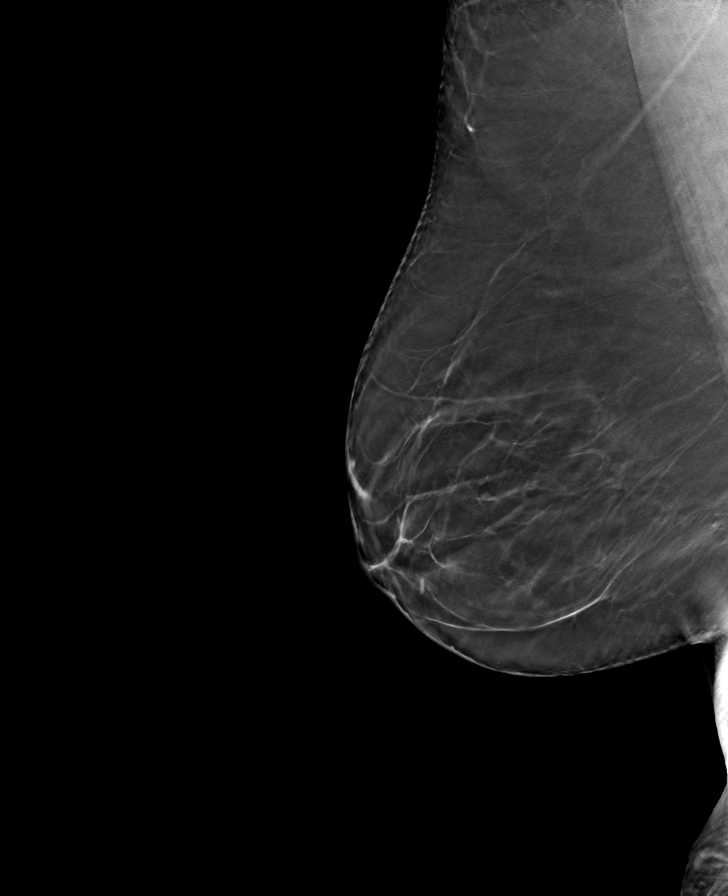

[8 of 24 positions shown; findings below may reference images not displayed]

FINDINGS: There are no findings suspicious for malignancy.
IMPRESSION: No mammographic evidence of malignancy. A result letter of this
screening mammogram will be mailed directly to the patient.

RECOMMENDATION:
Screening mammogram in one year. (Code:0E-[id number])

BI-RADS CATEGORY  1: Negative.

## 2023-11-01 ENCOUNTER — Other Ambulatory Visit (HOSPITAL_BASED_OUTPATIENT_CLINIC_OR_DEPARTMENT_OTHER): Payer: Self-pay

## 2023-11-01 ENCOUNTER — Other Ambulatory Visit: Payer: Self-pay

## 2023-11-08 ENCOUNTER — Other Ambulatory Visit (HOSPITAL_BASED_OUTPATIENT_CLINIC_OR_DEPARTMENT_OTHER): Payer: Self-pay

## 2023-11-08 ENCOUNTER — Encounter (HOSPITAL_BASED_OUTPATIENT_CLINIC_OR_DEPARTMENT_OTHER): Payer: Self-pay

## 2023-11-19 ENCOUNTER — Other Ambulatory Visit (HOSPITAL_BASED_OUTPATIENT_CLINIC_OR_DEPARTMENT_OTHER): Payer: Self-pay

## 2023-11-19 ENCOUNTER — Ambulatory Visit: Payer: Self-pay

## 2023-11-19 ENCOUNTER — Ambulatory Visit: Admitting: Medical

## 2023-11-19 VITALS — BP 150/80 | HR 71 | Temp 97.7°F | Resp 16 | Wt 250.4 lb

## 2023-11-19 DIAGNOSIS — J3489 Other specified disorders of nose and nasal sinuses: Secondary | ICD-10-CM

## 2023-11-19 DIAGNOSIS — M62838 Other muscle spasm: Secondary | ICD-10-CM | POA: Diagnosis not present

## 2023-11-19 DIAGNOSIS — R051 Acute cough: Secondary | ICD-10-CM

## 2023-11-19 DIAGNOSIS — R52 Pain, unspecified: Secondary | ICD-10-CM | POA: Diagnosis not present

## 2023-11-19 LAB — POCT INFLUENZA A/B
Influenza A, POC: NEGATIVE
Influenza B, POC: NEGATIVE

## 2023-11-19 LAB — POC COVID19 BINAXNOW: SARS Coronavirus 2 Ag: NEGATIVE

## 2023-11-19 MED ORDER — TIZANIDINE HCL 2 MG PO TABS
2.0000 mg | ORAL_TABLET | Freq: Four times a day (QID) | ORAL | 0 refills | Status: AC
  Filled 2023-11-19: qty 60, 15d supply, fill #0

## 2023-11-19 MED ORDER — AZITHROMYCIN 250 MG PO TABS
ORAL_TABLET | ORAL | 0 refills | Status: DC
Start: 2023-11-19 — End: 2024-01-22
  Filled 2023-11-19: qty 6, 5d supply, fill #0

## 2023-11-19 MED ORDER — ALBUTEROL SULFATE HFA 108 (90 BASE) MCG/ACT IN AERS
2.0000 | INHALATION_SPRAY | Freq: Four times a day (QID) | RESPIRATORY_TRACT | 0 refills | Status: AC | PRN
  Filled 2023-11-19: qty 6.7, 25d supply, fill #0

## 2023-11-19 NOTE — Progress Notes (Signed)
 Subjective:  Emily [name] is a 73 y.o. female who presents for Chief Complaint  Patient presents with   other    Sob,sore throat, right ear pain, sinus drainage, cough. Allergies and then the last 4 days has gotten worse. 10 days ago 2 co-workers had covid     Here with husband for symptoms  Here for sore throat, sinus pressure, right ear pain, cough, food tastes terrible.   Symptoms present about 3 days.  Not sure about fever, but has had chills.   Has had some nausea, some loose stool.  No vomiting.   Don't feel wheezy but having some trouble with deep breathing.   Getting some productive cough. Had 2 coworkers with covid recently.  Using mucinex  for symptoms.  Also been a little dizzy.  Has inhaler at home but hasn't had to use it. Gets a little SOB.  Nonsmoker.  Water intake is not the best.   Wants refill on muscle relaxer she uses periodically for bad back and spasm.   Out of muscle relaxer at home.  No other aggravating or relieving factors.    No other c/o.  Past Medical History:  Diagnosis Date   Acute bacterial sinusitis 06/29/2021   Acute non-recurrent ethmoidal sinusitis 08/05/2021   Acute non-recurrent pansinusitis 01/25/2022   Acute recurrent frontal sinusitis 03/08/2020   Allergy    seasonal    Anxiety    ARTHRALGIA 11/10/2009   Qualifier: Diagnosis of  By: Darren Em MD, Bruce     Cancer [facility]) 2009   squamous cell skin   Cataract    bilateral surgery, left unsuccessful   Cough 11/23/2020   Cystitis 02/12/2020   Depression    DYSPNEA 11/10/2009   Qualifier: Diagnosis of  By: Darren Em MD, Bruce     Eating disorder    GERD (gastroesophageal reflux disease)    Glaucoma    Head injury 07/23/2018   Hiatal hernia    History of cardiovascular stress test 04/2000   cardiolyte   History of flexible sigmoidoscopy 09/21/1999   Influenza A 03/13/2022   Other fatigue 02/26/2020   Otitis of both ears 03/08/2020   Pain of left lower extremity 03/28/2021    Preoperative evaluation of a medical condition to rule out surgical contraindications (TAR required) 10/18/2021   Shortness of breath 07/29/2022   Sinobronchitis 04/05/2021   Sinusitis 03/01/2023   SIRS (systemic inflammatory response syndrome) vs sepsis d/t COVID 03/01/2023   Subacute cough 11/23/2020   WEIGHT GAIN 11/10/2009   Qualifier: Diagnosis of  By: Darren Em MD, Bruce     Current Outpatient Medications on File Prior to Visit  Medication Sig Dispense Refill   ALPRAZolam  (XANAX ) 1 MG tablet Take 0.5-1 tablets (0.5-1 mg total) by mouth daily as needed. *this is 90 day supply 45 tablet 0   aspirin  EC 81 MG tablet Take 81 mg by mouth daily. Swallow whole. Takes once a week     azelastine  (OPTIVAR ) 0.05 % ophthalmic solution Place 2 drops into both eyes 2 (two) times daily. 6 mL 5   buPROPion  (WELLBUTRIN  XL) 150 MG 24 hr tablet Take 1 tablet (150 mg total) by mouth every morning. 90 tablet 3   Cholecalciferol  (VITAMIN D3) 1.25 MG (50000 UT) capsule Take 1 capsule (50,000 Units total) by mouth once a week. 4 capsule 12   dorzolamide  (TRUSOPT ) 2 % ophthalmic solution Place 1 drop into both eyes 3 (three) times daily. 30 mL 4   fluticasone  (FLONASE ) 50 MCG/ACT nasal spray Place 1  spray into both nostrils daily. 16 g 2   guaiFENesin  (MUCINEX ) 600 MG 12 hr tablet Take 600 mg by mouth 2 (two) times daily.     levocetirizine (XYZAL ) 5 MG tablet Take 1 tablet (5 mg total) by mouth every evening. For sinuses and runny nose. 30 tablet 3   meclizine  (ANTIVERT ) 25 MG tablet Take 1 tablet (25 mg total) by mouth 3 (three) times daily as needed for dizziness. 90 tablet 1   Netarsudil -Latanoprost  (ROCKLATAN ) 0.02-0.005 % SOLN Place 1 drop into both eyes at bedtime. 2.5 mL 11   traZODone  (DESYREL ) 100 MG tablet Take 3 tablets (300 mg total) by mouth at bedtime. 270 tablet 3   valsartan  (DIOVAN ) 40 MG tablet Take 1 tablet (40 mg total) by mouth daily. For blood pressure 90 tablet 1   Blood Pressure Monitor  MISC Use as directed 1 each 0   No current facility-administered medications on file prior to visit.     The following portions of the patient's history were reviewed and updated as appropriate: allergies, current medications, past family history, past medical history, past social history, past surgical history and problem list.  ROS Otherwise as in subjective above    Objective: BP (!) 150/80   Pulse 71   Temp 97.7 F (36.5 C)   Resp 16   Wt 250 lb 6.4 oz (113.6 kg)   SpO2 95%   BMI 39.81 kg/m   General appearance: alert, no distress, well developed, well nourished, somewhat ill appearing, coughing HEENT: normocephalic, sclerae anicteric, conjunctiva pink and moist, TMs pearly, nares with mild turbinate edema, no discharge,+erythema, pharynx normal Oral cavity: MMM, no lesions Neck: supple, no lymphadenopathy, no thyromegaly, no masses Heart: RRR, normal S1, S2, no murmurs Lungs: somewhat coarse sounds, no wheezes, rhonchi, or rales Pulses: 2+ radial pulses, 2+ pedal pulses, normal cap refill Ext: no edema   Assessment: Encounter Diagnoses  Name Primary?   Acute cough Yes   Body aches    Sinus drainage    Muscle spasm      Plan: Covid and flu negative  Advised increased water intake, rest, continue mucinex  DM OTC for the next 3-5 days, use your albuterol  inhaler 2-3 times daily for cough, sob.   If worse over the next 3-4 days, particularly with more consistent colored mucous then begin zpak.  If not improving or new worse symptoms, call or recheck.  She declines chest xray today  Muscle spasm - refilled muscle relaxer for prn use for spasm.  Discussed risks/benefits of medication she has used prior    Valorie was seen today for other.  Diagnoses and all orders for this visit:  Acute cough -     POC COVID-19 -     POCT Influenza A/B  Body aches -     POC COVID-19 -     POCT Influenza A/B  Sinus drainage -     POC COVID-19 -     POCT Influenza  A/B  Muscle spasm  Other orders -     tiZANidine  (ZANAFLEX ) 2 MG tablet; Take 1 tablet (2 mg total) by mouth every 6 (six) to 8 (eight) hours. -     azithromycin  (ZITHROMAX ) 250 MG tablet; Take 2 tablets day 1, then 1 tablet days 2-4 -     albuterol  (VENTOLIN  HFA) 108 (90 Base) MCG/ACT inhaler; Inhale 2 puffs into the lungs every 6 (six) hours as needed for wheezing or shortness of breath.   Follow up: prn

## 2023-11-19 NOTE — Telephone Encounter (Signed)
 Copied from CRM [id number]. Topic: Clinical - Red Word Triage >> Nov 19, 2023 10:42 AM El Gravely T wrote: [facility] that prompted transfer to Nurse Triage:  sore throat, difficulty breathing, shortness of breath, ear pain, and sinus drainage  Chief Complaint: sore throat, sob, ear pain, sinus drainage Symptoms: see above Frequency: constant Pertinent Negatives: Patient denies fever, cp Disposition: [] ED /[] Urgent Care (no appt availability in office) / [x] Appointment(In office/virtual)/ []  [facility]/ [] Home Care/ [] Refused Recommended Disposition /[] [facility]/ []  Follow-up with PCP Additional Notes: apt this am; care advice given, denies questions; instructed to go to ER if becomes worse.   Reason for Disposition  [1] MILD difficulty breathing (e.g., minimal/no SOB at rest, SOB with walking, pulse <100) AND [2] NEW-onset or WORSE than normal  Answer Assessment - Initial Assessment Questions 1. RESPIRATORY STATUS: "Describe your breathing?" (e.g., wheezing, shortness of breath, unable to speak, severe coughing)      Two to three days ago 2. ONSET: "When did this breathing problem begin?"      Short of breath, sore throat, right ear pain, sinus drainage 3. PATTERN "Does the difficult breathing come and go, or has it been constant since it started?"      Comes and goes 4. SEVERITY: "How bad is your breathing?" (e.g., mild, moderate, severe)    - MILD: No SOB at rest, mild SOB with walking, speaks normally in sentences, can lie down, no retractions, pulse < 100.    - MODERATE: SOB at rest, SOB with minimal exertion and prefers to sit, cannot lie down flat, speaks in phrases, mild retractions, audible wheezing, pulse 100-120.    - SEVERE: Very SOB at rest, speaks in single words, struggling to breathe, sitting hunched forward, retractions, pulse > 120      mild 5. RECURRENT SYMPTOM: "Have you had difficulty breathing before?" If Yes, ask: "When was the last time?" and "What  happened that time?"      yes 6. CARDIAC HISTORY: "Do you have any history of heart disease?" (e.g., heart attack, angina, bypass surgery, angioplasty)      no 7. LUNG HISTORY: "Do you have any history of lung disease?"  (e.g., pulmonary embolus, asthma, emphysema)     no 8. CAUSE: "What do you think is causing the breathing problem?"      sinus 9. OTHER SYMPTOMS: "Do you have any other symptoms? (e.g., dizziness, runny nose, cough, chest pain, fever)     Runny nose cough, sore throat, sob, ear pain , sinus drainage - clear 10. O2 SATURATION MONITOR:  "Do you use an oxygen saturation monitor (pulse oximeter) at home?" If Yes, ask: "What is your reading (oxygen level) today?" "What is your usual oxygen saturation reading?" (e.g., 95%)       na 11. PREGNANCY: "Is there any chance you are pregnant?" "When was your last menstrual period?"       na 12. TRAVEL: "Have you traveled out of the country in the last month?" (e.g., travel history, exposures)       no  Protocols used: Breathing Difficulty-A-AH

## 2023-12-04 ENCOUNTER — Other Ambulatory Visit (HOSPITAL_BASED_OUTPATIENT_CLINIC_OR_DEPARTMENT_OTHER): Payer: Self-pay

## 2023-12-04 DIAGNOSIS — H401131 Primary open-angle glaucoma, bilateral, mild stage: Secondary | ICD-10-CM | POA: Diagnosis not present

## 2023-12-04 DIAGNOSIS — Z961 Presence of intraocular lens: Secondary | ICD-10-CM | POA: Diagnosis not present

## 2023-12-04 MED ORDER — TIMOLOL MALEATE 0.5 % OP SOLN
1.0000 [drp] | Freq: Two times a day (BID) | OPHTHALMIC | 6 refills | Status: AC
  Filled 2023-12-04: qty 5, 25d supply, fill #0
  Filled 2024-01-15: qty 5, 25d supply, fill #1
  Filled 2024-03-04: qty 5, 25d supply, fill #2
  Filled 2024-06-05: qty 5, 25d supply, fill #3
  Filled 2024-07-08: qty 5, 25d supply, fill #4

## 2023-12-06 ENCOUNTER — Other Ambulatory Visit (HOSPITAL_BASED_OUTPATIENT_CLINIC_OR_DEPARTMENT_OTHER): Payer: Self-pay

## 2023-12-20 ENCOUNTER — Other Ambulatory Visit (HOSPITAL_BASED_OUTPATIENT_CLINIC_OR_DEPARTMENT_OTHER): Payer: Self-pay

## 2024-01-15 ENCOUNTER — Other Ambulatory Visit (HOSPITAL_BASED_OUTPATIENT_CLINIC_OR_DEPARTMENT_OTHER): Payer: Self-pay

## 2024-01-15 ENCOUNTER — Other Ambulatory Visit: Payer: Self-pay

## 2024-01-15 ENCOUNTER — Other Ambulatory Visit: Payer: Self-pay | Admitting: Nurse Practitioner

## 2024-01-15 DIAGNOSIS — J301 Allergic rhinitis due to pollen: Secondary | ICD-10-CM

## 2024-01-15 DIAGNOSIS — H401132 Primary open-angle glaucoma, bilateral, moderate stage: Secondary | ICD-10-CM | POA: Diagnosis not present

## 2024-01-15 DIAGNOSIS — H04123 Dry eye syndrome of bilateral lacrimal glands: Secondary | ICD-10-CM | POA: Diagnosis not present

## 2024-01-15 MED ORDER — LEVOCETIRIZINE DIHYDROCHLORIDE 5 MG PO TABS
5.0000 mg | ORAL_TABLET | Freq: Every evening | ORAL | 5 refills | Status: AC
  Filled 2024-01-15: qty 30, 30d supply, fill #0
  Filled 2024-03-04: qty 30, 30d supply, fill #1
  Filled 2024-05-20: qty 30, 30d supply, fill #2
  Filled 2024-06-27: qty 30, 30d supply, fill #3

## 2024-01-21 ENCOUNTER — Ambulatory Visit: Payer: Self-pay

## 2024-01-21 NOTE — Telephone Encounter (Signed)
 FYI Only or Action Required?: FYI only for provider.  Patient was last seen in primary care on 11/19/2023 by [name], PA-C.  Called Nurse Triage reporting Otalgia.  Symptoms began 4 to 5 days ago.  Interventions attempted: Nothing.  Symptoms are: unchanged.  Triage Disposition: See Physician Within 24 Hours  Patient/caregiver understands and will follow disposition?: Yes   Copied from CRM #[id number]. Topic: Clinical - Red Word Triage >> Jan 21, 2024  7:58 AM Willma SAUNDERS wrote: Red Word that prompted transfer to Nurse Triage: Patient states she has some pain in her ear and thinks she has a sinus infection. Reason for Disposition  [1] Sinus congestion (pressure, fullness) AND [2] present > 10 days  White, yellow, or green discharge (pus)  Answer Assessment - Initial Assessment Questions 1. LOCATION: Which ear is involved?     Left ear 2. ONSET: When did the ear pain start?      4 to 5 days ago 3. SEVERITY: How bad is the pain?  (Scale 1-10; mild, moderate or severe)     6 to 9.5 /10 4. URI SYMPTOMS: Do you have a runny nose or cough?     Cough, sneezing, headaches over right eye 5. FEVER: Do you have a fever? If Yes, ask: What is your temperature, how was it measured, and when did it start?     unknown 6. CAUSE: Have you been swimming recently?, How often do you use Q-TIPS?, Have you had any recent air travel or scuba diving?     na 7. OTHER SYMPTOMS: Do you have any other symptoms? (e.g., decreased hearing, dizziness, headache, stiff neck, vomiting)     Headache, itches a lot, ear feels swollen. 8. PREGNANCY: Is there any chance you are pregnant? When was your last menstrual period?     Na  Pt requested virtual appt  Answer Assessment - Initial Assessment Questions 1. LOCATION: Where does it hurt?      headache 2. ONSET: When did the sinus pain start?  (e.g., hours, days)      4 or 5 days 3. SEVERITY: How bad is the pain?   (Scale  0-10; or none, mild, moderate or severe)     moderate 4. RECURRENT SYMPTOM: Have you ever had sinus problems before? If Yes, ask: When was the last time? and What happened that time?      na 5. NASAL CONGESTION: Is the nose blocked? If Yes, ask: Can you open it or must you breathe through your mouth?     At times 6. NASAL DISCHARGE: Do you have discharge from your nose? If so ask, What color?     clear 7. FEVER: Do you have a fever? If Yes, ask: What is it, how was it measured, and when did it start?       unknown 8. OTHER SYMPTOMS: Do you have any other symptoms? (e.g., sore throat, cough, earache, difficulty breathing)     Earache, cough, headache, wheezing at times 9. PREGNANCY: Is there any chance you are pregnant? When was your last menstrual period?     no  Protocols used: Earache-A-AH, Sinus Pain or Congestion-A-AH

## 2024-01-22 ENCOUNTER — Telehealth: Admitting: Medical

## 2024-01-22 ENCOUNTER — Other Ambulatory Visit (HOSPITAL_BASED_OUTPATIENT_CLINIC_OR_DEPARTMENT_OTHER): Payer: Self-pay

## 2024-01-22 VITALS — Wt 247.0 lb

## 2024-01-22 DIAGNOSIS — H6690 Otitis media, unspecified, unspecified ear: Secondary | ICD-10-CM

## 2024-01-22 DIAGNOSIS — R059 Cough, unspecified: Secondary | ICD-10-CM | POA: Diagnosis not present

## 2024-01-22 DIAGNOSIS — G8929 Other chronic pain: Secondary | ICD-10-CM | POA: Diagnosis not present

## 2024-01-22 DIAGNOSIS — M549 Dorsalgia, unspecified: Secondary | ICD-10-CM

## 2024-01-22 DIAGNOSIS — M62838 Other muscle spasm: Secondary | ICD-10-CM

## 2024-01-22 DIAGNOSIS — H9203 Otalgia, bilateral: Secondary | ICD-10-CM

## 2024-01-22 DIAGNOSIS — H938X3 Other specified disorders of ear, bilateral: Secondary | ICD-10-CM

## 2024-01-22 MED ORDER — CIPROFLOXACIN-DEXAMETHASONE 0.3-0.1 % OT SUSP
4.0000 [drp] | Freq: Two times a day (BID) | OTIC | 0 refills | Status: DC
  Filled 2024-01-22: qty 7.5, 19d supply, fill #0

## 2024-01-22 MED ORDER — AMOXICILLIN-POT CLAVULANATE 875-125 MG PO TABS
1.0000 | ORAL_TABLET | Freq: Two times a day (BID) | ORAL | 0 refills | Status: DC
Start: 2024-01-22 — End: 2024-04-17
  Filled 2024-01-22: qty 20, 10d supply, fill #0

## 2024-01-22 MED ORDER — TRAMADOL HCL 50 MG PO TABS
50.0000 mg | ORAL_TABLET | Freq: Two times a day (BID) | ORAL | 0 refills | Status: AC
  Filled 2024-01-22: qty 10, 5d supply, fill #0

## 2024-01-22 NOTE — Progress Notes (Signed)
 Subjective:     Patient ID: Emily [name], female   DOB: 1950/06/29, 73 y.o.   MRN: [id number]  This visit type was conducted due to national recommendations for restrictions regarding the COVID-19 Pandemic (e.g. social distancing) in an effort to limit this patient's exposure and mitigate transmission in our community.  Due to their co-morbid illnesses, this patient is at least at moderate risk for complications without adequate follow up.  This format is felt to be most appropriate for this patient at this time.    Documentation for virtual audio and video telecommunications through [place] encounter:  The patient was located at home. The provider was located in the office. The patient did consent to this visit and is aware of possible charges through their insurance for this visit.  The other persons participating in this telemedicine service were none. Time spent on call was 20 minutes and in review of previous records 20 minutes total.  This virtual service is not related to other E/M service within previous 7 days.   HPI Chief Complaint  Patient presents with   Acute Visit    Ear ache x 4-5 days- painful and inside ear is drainage, and congestion and coughing, runny nose. Cough started 3 weeks ago   Virtual for ear pain. Both ears hurt, left worse.  Symptoms x 4-5 days, feels like ear canal is swollen.   Has had fluid drainage from ear.  Has congestion in head, some cough, runny nose.  No fever.  No NVD, but has been dizzy.  Sometimes sore throat.  Has post nasal drainage.  No dyspnea or SOB.   Has headaches.   Using Advil, mucinex , xyzal .  Left ear is itchy as well.    No sick contacts.  No recent swimming.    Going out of town for a week, wants something to help with pain.   Has pain/ low back and goes down legs.   No numnbess or tingling in legs.   Has chronic back pain but over weekend slid when foot slipped. Pulled muscle in back.  Hurts to walk long periods  currently.  Fever, no incontinence, no saddle anesthesia reported.     Past Medical History:  Diagnosis Date   Acute bacterial sinusitis 06/29/2021   Acute non-recurrent ethmoidal sinusitis 08/05/2021   Acute non-recurrent pansinusitis 01/25/2022   Acute recurrent frontal sinusitis 03/08/2020   Allergy    seasonal    Anxiety    ARTHRALGIA 11/10/2009   Qualifier: Diagnosis of  By: Micheal MD, Bruce     Cancer [facility]) 2009   squamous cell skin   Cataract    bilateral surgery, left unsuccessful   Cough 11/23/2020   Cystitis 02/12/2020   Depression    DYSPNEA 11/10/2009   Qualifier: Diagnosis of  By: Micheal MD, Bruce     Eating disorder    GERD (gastroesophageal reflux disease)    Glaucoma    Head injury 07/23/2018   Hiatal hernia    History of cardiovascular stress test 04/2000   cardiolyte   History of flexible sigmoidoscopy 09/21/1999   Influenza A 03/13/2022   Other fatigue 02/26/2020   Otitis of both ears 03/08/2020   Pain of left lower extremity 03/28/2021   Preoperative evaluation of a medical condition to rule out surgical contraindications (TAR required) 10/18/2021   Shortness of breath 07/29/2022   Sinobronchitis 04/05/2021   Sinusitis 03/01/2023   SIRS (systemic inflammatory response syndrome) vs sepsis d/t COVID 03/01/2023   Subacute cough 11/23/2020  WEIGHT GAIN 11/10/2009   Qualifier: Diagnosis of  By: Micheal MD, Bruce     Current Outpatient Medications on File Prior to Visit  Medication Sig Dispense Refill   albuterol  (VENTOLIN  HFA) 108 (90 Base) MCG/ACT inhaler Inhale 2 puffs into the lungs every 6 (six) hours as needed for wheezing or shortness of breath. 6.7 g 0   ALPRAZolam  (XANAX ) 1 MG tablet Take 0.5-1 tablets (0.5-1 mg total) by mouth daily as needed. *this is 90 day supply 45 tablet 0   aspirin  EC 81 MG tablet Take 81 mg by mouth daily. Swallow whole. Takes once a week     azelastine  (OPTIVAR ) 0.05 % ophthalmic solution Place 2 drops into both  eyes 2 (two) times daily. 6 mL 5   buPROPion  (WELLBUTRIN  XL) 150 MG 24 hr tablet Take 1 tablet (150 mg total) by mouth every morning. 90 tablet 3   Cholecalciferol  (VITAMIN D3) 1.25 MG (50000 UT) capsule Take 1 capsule (50,000 Units total) by mouth once a week. 4 capsule 12   dorzolamide  (TRUSOPT ) 2 % ophthalmic solution Place 1 drop into both eyes 3 (three) times daily. 30 mL 4   fluticasone  (FLONASE ) 50 MCG/ACT nasal spray Place 1 spray into both nostrils daily. 16 g 2   guaiFENesin  (MUCINEX ) 600 MG 12 hr tablet Take 600 mg by mouth 2 (two) times daily.     levocetirizine (XYZAL ) 5 MG tablet Take 1 tablet (5 mg total) by mouth every evening. For sinuses and runny nose. 30 tablet 5   meclizine  (ANTIVERT ) 25 MG tablet Take 1 tablet (25 mg total) by mouth 3 (three) times daily as needed for dizziness. 90 tablet 1   Netarsudil -Latanoprost  (ROCKLATAN ) 0.02-0.005 % SOLN Place 1 drop into both eyes at bedtime. 2.5 mL 11   timolol  (TIMOPTIC ) 0.5 % ophthalmic solution Place 1 drop into both eyes 2 (two) times daily. 5 mL 6   tiZANidine  (ZANAFLEX ) 2 MG tablet Take 1 tablet (2 mg total) by mouth every 6 (six) to 8 (eight) hours. 60 tablet 0   traZODone  (DESYREL ) 100 MG tablet Take 3 tablets (300 mg total) by mouth at bedtime. 270 tablet 3   valsartan  (DIOVAN ) 40 MG tablet Take 1 tablet (40 mg total) by mouth daily. For blood pressure 90 tablet 1   Blood Pressure Monitor MISC Use as directed 1 each 0   No current facility-administered medications on file prior to visit.    Review of Systems As in subjective    Objective:   Physical Exam Due to coronavirus pandemic stay at home measures, patient visit was virtual and they were not examined in person.   Wt 247 lb (112 kg)   BMI 39.27 kg/m   Gen: wd, wn ,nad Limited as this is a virtual visit      Assessment:     Encounter Diagnoses  Name Primary?   Ear infection Yes   Ear swelling, bilateral    Otalgia of both ears    Cough, unspecified  type    Chronic back pain, unspecified back location, unspecified back pain laterality    Muscle spasm        Plan:     We discussed her ear and respiratory symptoms.  We discussed limitation of virtual consult particularly since I cannot examine her ear in person.  Presumptively we will begin Augmentin  antibiotic for possible inner and external ear infection.  Begin drops as below as well.  Continue over-the-counter measures with Mucinex  for congestion and mucus.  If worse ear pain or swelling in the next 48 hours, then recheck in person.  Otherwise should gradually see improvements over the next 4 to 5 days  Regarding back pain, again discussed limitations of virtual consult.  Advised stretching, rest, no lifting over the next week greater than 10 pounds.  She has some muscle laxer tizanidine  leftover from last prescription in June, Tizanidine .  She can use those over the next few days for spasm and tension.  For breakthrough pain can use that short-term tramadol  sent as below.  If not much improved within the next week then recheck in person.  We discussed risk and benefits of medication.    Emily [name] was seen today for acute visit.  Diagnoses and all orders for this visit:  Ear infection  Ear swelling, bilateral  Otalgia of both ears  Cough, unspecified type  Chronic back pain, unspecified back location, unspecified back pain laterality  Muscle spasm  Other orders -     amoxicillin -clavulanate (AUGMENTIN ) 875-125 MG tablet; Take 1 tablet by mouth 2 (two) times daily. -     ciprofloxacin -dexamethasone  (CIPRODEX ) OTIC suspension; Place 4 drops into both ears 2 (two) times daily. -     traMADol  (ULTRAM ) 50 MG tablet; Take 1 tablet (50 mg total) by mouth in the morning and at bedtime for 5 days.    F/u prn

## 2024-02-19 ENCOUNTER — Encounter: Payer: Self-pay | Admitting: Sports Medicine

## 2024-02-23 ENCOUNTER — Emergency Department (HOSPITAL_BASED_OUTPATIENT_CLINIC_OR_DEPARTMENT_OTHER)

## 2024-02-23 ENCOUNTER — Emergency Department (HOSPITAL_BASED_OUTPATIENT_CLINIC_OR_DEPARTMENT_OTHER)
Admission: EM | Admit: 2024-02-23 | Discharge: 2024-02-23 | Disposition: A | Discharge status: Home / Self Care | Attending: Emergency Medicine | Admitting: Emergency Medicine

## 2024-02-23 ENCOUNTER — Encounter (HOSPITAL_BASED_OUTPATIENT_CLINIC_OR_DEPARTMENT_OTHER): Payer: Self-pay

## 2024-02-23 DIAGNOSIS — S6991XA Unspecified injury of right wrist, hand and finger(s), initial encounter: Secondary | ICD-10-CM

## 2024-02-23 DIAGNOSIS — W231XXA Caught, crushed, jammed, or pinched between stationary objects, initial encounter: Secondary | ICD-10-CM | POA: Insufficient documentation

## 2024-02-23 DIAGNOSIS — S60942A Unspecified superficial injury of right middle finger, initial encounter: Secondary | ICD-10-CM | POA: Insufficient documentation

## 2024-02-23 DIAGNOSIS — S60940A Unspecified superficial injury of right index finger, initial encounter: Secondary | ICD-10-CM | POA: Diagnosis not present

## 2024-02-23 DIAGNOSIS — Z7982 Long term (current) use of aspirin: Secondary | ICD-10-CM | POA: Diagnosis not present
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
## 2024-02-23 NOTE — ED Notes (Signed)
 Reviewed discharge instructions and home care with pt. Pt verbalized understanding and had no further questions. Pt exited ED without complications.

## 2024-02-23 NOTE — Discharge Instructions (Addendum)
 You were seen in the emergency department today for evaluation of your hand pain.  Your x-rays did not show any acute injury.  This is likely a bruise.  Please make sure that you are resting and elevating your hand above your heart and the position shown to you earlier this will help with your pain and with your swelling.  For pain, recommend taking 1000 g of Tylenol  every 6 hours as needed for pain.  Remember to only apply ice to the area 15 minutes max every few hours to avoid skin tissue damage.  If you continue to have pain, I did include the information for a hand surgeon for you to follow-up with.  Additionally, there is additional information for you to review.  If you start having any coloration changes, temperature changes, worsening swelling, worsening pain, numbness, tingling, please return to your nearest emergency department for reevaluation.  Contact a doctor if: A wound with stitches opens up. You have more redness, swelling, or pain in your hand. You have more fluid or blood coming from your hand. Your hand feels warm to the touch. You have pus or a bad smell coming from your hand. You have a fever. Get help right away if: You suddenly have very bad pain in your hand. You had feeling in your hand before but you suddenly lose feeling. Your wrist or hand becomes bent (contracted)without you trying to bend it. Your symptoms had gotten better and they suddenly get worse. Your hand or fingers are turning pink or blue.

## 2024-03-04 ENCOUNTER — Other Ambulatory Visit (HOSPITAL_BASED_OUTPATIENT_CLINIC_OR_DEPARTMENT_OTHER): Payer: Self-pay

## 2024-03-06 NOTE — Progress Notes (Signed)
   03/06/2024  Patient ID: Emily [name], female   DOB: 02-Nov-1950, 73 y.o.   MRN: [id number]  Pharmacy Quality Measure Review  This patient is appearing on a report for being at risk of failing the adherence measure for hypertension (ACEi/ARB) medications this calendar year.   Medication: Valsartan  Last fill date: 01/18/24 for 90 day supply  Insurance report was not up to date. No action needed at this time.   Jon VEAR [place], PharmD Clinical Pharmacist [phone]

## 2024-03-24 ENCOUNTER — Other Ambulatory Visit: Payer: Self-pay | Admitting: Nurse Practitioner

## 2024-03-24 ENCOUNTER — Other Ambulatory Visit (HOSPITAL_BASED_OUTPATIENT_CLINIC_OR_DEPARTMENT_OTHER): Payer: Self-pay

## 2024-03-24 DIAGNOSIS — H1033 Unspecified acute conjunctivitis, bilateral: Secondary | ICD-10-CM

## 2024-03-24 MED ORDER — AZELASTINE HCL 0.05 % OP SOLN
2.0000 [drp] | Freq: Two times a day (BID) | OPHTHALMIC | 5 refills | Status: DC
  Filled 2024-03-24: qty 6, 15d supply, fill #0
  Filled 2024-06-05: qty 6, 15d supply, fill #1

## 2024-04-02 DIAGNOSIS — M9903 Segmental and somatic dysfunction of lumbar region: Secondary | ICD-10-CM | POA: Diagnosis not present

## 2024-04-02 DIAGNOSIS — M5116 Intervertebral disc disorders with radiculopathy, lumbar region: Secondary | ICD-10-CM | POA: Diagnosis not present

## 2024-04-02 DIAGNOSIS — M25551 Pain in right hip: Secondary | ICD-10-CM | POA: Diagnosis not present

## 2024-04-02 DIAGNOSIS — M9905 Segmental and somatic dysfunction of pelvic region: Secondary | ICD-10-CM | POA: Diagnosis not present

## 2024-04-03 DIAGNOSIS — M25551 Pain in right hip: Secondary | ICD-10-CM | POA: Diagnosis not present

## 2024-04-03 DIAGNOSIS — M9903 Segmental and somatic dysfunction of lumbar region: Secondary | ICD-10-CM | POA: Diagnosis not present

## 2024-04-03 DIAGNOSIS — M5116 Intervertebral disc disorders with radiculopathy, lumbar region: Secondary | ICD-10-CM | POA: Diagnosis not present

## 2024-04-03 DIAGNOSIS — M9905 Segmental and somatic dysfunction of pelvic region: Secondary | ICD-10-CM | POA: Diagnosis not present

## 2024-04-09 DIAGNOSIS — M25551 Pain in right hip: Secondary | ICD-10-CM | POA: Diagnosis not present

## 2024-04-09 DIAGNOSIS — M9905 Segmental and somatic dysfunction of pelvic region: Secondary | ICD-10-CM | POA: Diagnosis not present

## 2024-04-09 DIAGNOSIS — M9903 Segmental and somatic dysfunction of lumbar region: Secondary | ICD-10-CM | POA: Diagnosis not present

## 2024-04-09 DIAGNOSIS — M5116 Intervertebral disc disorders with radiculopathy, lumbar region: Secondary | ICD-10-CM | POA: Diagnosis not present

## 2024-04-16 ENCOUNTER — Other Ambulatory Visit (HOSPITAL_BASED_OUTPATIENT_CLINIC_OR_DEPARTMENT_OTHER): Payer: Self-pay

## 2024-04-17 ENCOUNTER — Other Ambulatory Visit (HOSPITAL_BASED_OUTPATIENT_CLINIC_OR_DEPARTMENT_OTHER): Payer: Self-pay

## 2024-04-17 ENCOUNTER — Ambulatory Visit (INDEPENDENT_AMBULATORY_CARE_PROVIDER_SITE_OTHER): Admitting: Nurse Practitioner

## 2024-04-17 ENCOUNTER — Encounter: Payer: Self-pay | Admitting: Nurse Practitioner

## 2024-04-17 ENCOUNTER — Other Ambulatory Visit: Payer: Self-pay | Admitting: Nurse Practitioner

## 2024-04-17 VITALS — BP 124/80 | HR 64 | Wt 249.0 lb

## 2024-04-17 DIAGNOSIS — J4489 Other specified chronic obstructive pulmonary disease: Secondary | ICD-10-CM | POA: Diagnosis not present

## 2024-04-17 DIAGNOSIS — J0140 Acute pansinusitis, unspecified: Secondary | ICD-10-CM

## 2024-04-17 DIAGNOSIS — J302 Other seasonal allergic rhinitis: Secondary | ICD-10-CM

## 2024-04-17 MED ORDER — AMOXICILLIN-POT CLAVULANATE 875-125 MG PO TABS
1.0000 | ORAL_TABLET | Freq: Two times a day (BID) | ORAL | 0 refills | Status: DC
  Filled 2024-04-17: qty 20, 10d supply, fill #0

## 2024-04-17 MED ORDER — AZELASTINE HCL 0.1 % NA SOLN
2.0000 | Freq: Two times a day (BID) | NASAL | 12 refills | Status: AC
  Filled 2024-04-17: qty 30, 50d supply, fill #0

## 2024-04-17 NOTE — Assessment & Plan Note (Signed)
 Symptoms consistent with bacterial sinusitis. Symptoms initially consistent with allergens, but have continued progressively for two weeks. I do feel antibiotic therapy is warranted at this time. Very mild rhonchi present in the bases of the lungs with no signs of respiratory distress. Recommend mucinex  for management of purulent cough and to help clear the lungs. Will also start azelastine  to see if this is helpful with current symptoms and reduction of future outbreaks due to allergens.

## 2024-04-17 NOTE — Assessment & Plan Note (Signed)
 Chronic cough worsened with recent infection. No alarm symptoms present at this time. She is clearing the mucus on her own. Will monitor closely. Antibiotics for infection started. Recommend mucinex  for ongoing management of cough.

## 2024-04-17 NOTE — Progress Notes (Signed)
  Emily [name] Doing, DNP, AGNP-c [facility] [address] [place], [place] [address] [phone]   ACUTE VISIT on 04/17/2024  Blood pressure 124/80, pulse 64, weight 249 lb (112.9 kg).  Subjective:  HPI  [place] presents with sinus pain and pressure ongoing for the last 2 weeks. She feels the symptoms initally started as an allergy exacerbation, but have continued and recently worsened. She endorses frontal and maxillary pressure, productive cough, and fatigue. She is prone to sinus infections in the fall with allergens and weather changes.    ROS negative except for what is listed in HPI. History, Medications, Surgery, SDOH, and Family History reviewed and updated as appropriate.  Objective:  Physical Exam Vitals and nursing note reviewed.  Constitutional:      General: She is not in acute distress.    Appearance: Normal appearance.  HENT:     Head: Normocephalic.     Right Ear: Tympanic membrane is erythematous.     Left Ear: Tympanic membrane normal.     Nose: Mucosal edema, congestion and rhinorrhea present.     Right Sinus: Maxillary sinus tenderness and frontal sinus tenderness present.     Left Sinus: Maxillary sinus tenderness and frontal sinus tenderness present.     Mouth/Throat:     Mouth: Mucous membranes are moist.     Pharynx: Posterior oropharyngeal erythema and postnasal drip present.     Tonsils: No tonsillar exudate.  Cardiovascular:     Rate and Rhythm: Normal rate and regular rhythm.     Pulses: Normal pulses.     Heart sounds: Normal heart sounds.  Pulmonary:     Effort: Pulmonary effort is normal.     Breath sounds: Rhonchi present.     Comments: Very mild crackles in the bases of the lungs bilaterally.  Musculoskeletal:     Right lower leg: No edema.     Left lower leg: No edema.  Lymphadenopathy:     Cervical: Cervical adenopathy present.  Skin:    General: Skin is warm and dry.     Capillary Refill: Capillary refill takes less  than 2 seconds.  Neurological:     Mental Status: She is alert and oriented to person, place, and time.     Motor: No weakness.  Psychiatric:        Mood and Affect: Mood normal.         Assessment & Plan:   Problem List Items Addressed This Visit     Acute non-recurrent pansinusitis - Primary   Symptoms consistent with bacterial sinusitis. Symptoms initially consistent with allergens, but have continued progressively for two weeks. I do feel antibiotic therapy is warranted at this time. Very mild rhonchi present in the bases of the lungs with no signs of respiratory distress. Recommend mucinex  for management of purulent cough and to help clear the lungs. Will also start azelastine  to see if this is helpful with current symptoms and reduction of future outbreaks due to allergens.       Other specified chronic obstructive pulmonary disease (HCC)   Chronic cough worsened with recent infection. No alarm symptoms present at this time. She is clearing the mucus on her own. Will monitor closely. Antibiotics for infection started. Recommend mucinex  for ongoing management of cough.        Emily [name] Doing, DNP, AGNP-c Time: 20 minutes, >50% spent counseling, care coordination, chart review, and documentation.

## 2024-04-23 DIAGNOSIS — M9905 Segmental and somatic dysfunction of pelvic region: Secondary | ICD-10-CM | POA: Diagnosis not present

## 2024-04-23 DIAGNOSIS — M9903 Segmental and somatic dysfunction of lumbar region: Secondary | ICD-10-CM | POA: Diagnosis not present

## 2024-04-23 DIAGNOSIS — M25551 Pain in right hip: Secondary | ICD-10-CM | POA: Diagnosis not present

## 2024-04-23 DIAGNOSIS — M5116 Intervertebral disc disorders with radiculopathy, lumbar region: Secondary | ICD-10-CM | POA: Diagnosis not present

## 2024-04-24 ENCOUNTER — Ambulatory Visit (INDEPENDENT_AMBULATORY_CARE_PROVIDER_SITE_OTHER): Admitting: Medical

## 2024-04-24 ENCOUNTER — Other Ambulatory Visit (HOSPITAL_BASED_OUTPATIENT_CLINIC_OR_DEPARTMENT_OTHER): Payer: Self-pay

## 2024-04-24 ENCOUNTER — Ambulatory Visit: Payer: Self-pay

## 2024-04-24 VITALS — BP 122/64 | HR 68 | Temp 98.4°F | Wt 245.4 lb

## 2024-04-24 DIAGNOSIS — R0600 Dyspnea, unspecified: Secondary | ICD-10-CM | POA: Diagnosis not present

## 2024-04-24 DIAGNOSIS — J40 Bronchitis, not specified as acute or chronic: Secondary | ICD-10-CM | POA: Diagnosis not present

## 2024-04-24 DIAGNOSIS — R051 Acute cough: Secondary | ICD-10-CM | POA: Diagnosis not present

## 2024-04-24 DIAGNOSIS — R5383 Other fatigue: Secondary | ICD-10-CM

## 2024-04-24 DIAGNOSIS — J329 Chronic sinusitis, unspecified: Secondary | ICD-10-CM

## 2024-04-24 MED ORDER — PREDNISONE 20 MG PO TABS
20.0000 mg | ORAL_TABLET | Freq: Every day | ORAL | 0 refills | Status: DC
  Filled 2024-04-24: qty 3, 3d supply, fill #0

## 2024-04-24 NOTE — Progress Notes (Signed)
 Subjective: Chief Complaint  Patient presents with   Follow-up    Was seen last week for sinus infection last week but still not well. Cough, some breathing issues, been doing anitbiotic but not responding good to it   Emily [name] is a 73 year old female who presents with persistent symptoms of a sinus infection despite antibiotic treatment.  She initially visited the clinic last week with symptoms of a sinus infection and was prescribed antibiotics, which she has been taking consistently. Unlike previous instances where she felt better by the third or fourth day of antibiotic treatment, she has not experienced improvement this time.  She describes feeling weak and having no energy, which is unusual for her. She also experiences dizziness, which she attributes to her worsening eye condition. The patient reports that for her breathing to feel relaxed, she needs to be sitting up, and she continues to have a cough, although she is not expectorating any mucus.  She is currently taking Augmentin  and Mucinex . She uses a nasal saline flush and has an inhaler, which she uses when her chest feels tight. She notes that she does not drink much water.  She does not have a history of asthma or lung disease. She recalls that in the past, when using a CPAP, she often required two rounds of antibiotics. She is allergic to doxycycline  and has never needed steroid treatment for her symptoms.  No other aggravating or relieving factors. No other complaint.   Past Medical History:  Diagnosis Date   Acute bacterial sinusitis 06/29/2021   Acute non-recurrent ethmoidal sinusitis 08/05/2021   Acute non-recurrent pansinusitis 01/25/2022   Acute recurrent frontal sinusitis 03/08/2020   Allergy    seasonal    Anxiety    ARTHRALGIA 11/10/2009   Qualifier: Diagnosis of  By: Micheal MD, Bruce     Cancer [facility]) 2009   squamous cell skin   Cataract    bilateral surgery, left unsuccessful   Cough  11/23/2020   Cystitis 02/12/2020   Depression    DYSPNEA 11/10/2009   Qualifier: Diagnosis of  By: Micheal MD, Bruce     Eating disorder    GERD (gastroesophageal reflux disease)    Glaucoma    Head injury 07/23/2018   Hiatal hernia    History of cardiovascular stress test 04/2000   cardiolyte   History of flexible sigmoidoscopy 09/21/1999   Influenza A 03/13/2022   Other fatigue 02/26/2020   Otitis of both ears 03/08/2020   Pain of left lower extremity 03/28/2021   Preoperative evaluation of a medical condition to rule out surgical contraindications (TAR required) 10/18/2021   Shortness of breath 07/29/2022   Sinobronchitis 04/05/2021   Sinusitis 03/01/2023   SIRS (systemic inflammatory response syndrome) vs sepsis d/t COVID 03/01/2023   Subacute cough 11/23/2020   WEIGHT GAIN 11/10/2009   Qualifier: Diagnosis of  By: Micheal MD, Bruce     Current Outpatient Medications on File Prior to Visit  Medication Sig Dispense Refill   albuterol  (VENTOLIN  HFA) 108 (90 Base) MCG/ACT inhaler Inhale 2 puffs into the lungs every 6 (six) hours as needed for wheezing or shortness of breath. 6.7 g 0   ALPRAZolam  (XANAX ) 1 MG tablet Take 0.5-1 tablets (0.5-1 mg total) by mouth daily as needed. *this is 90 day supply 45 tablet 0   amoxicillin -clavulanate (AUGMENTIN ) 875-125 MG tablet Take 1 tablet by mouth 2 (two) times daily. 20 tablet 0   aspirin  EC 81 MG tablet Take 81 mg by mouth  daily. Swallow whole. Takes once a week     azelastine  (ASTELIN ) 0.1 % nasal spray Place 2 sprays into both nostrils 2 (two) times daily. Use in each nostril for allergy symptoms. 30 mL 12   azelastine  (OPTIVAR ) 0.05 % ophthalmic solution Place 2 drops into both eyes 2 (two) times daily. 6 mL 5   buPROPion  (WELLBUTRIN  XL) 150 MG 24 hr tablet Take 1 tablet (150 mg total) by mouth every morning. 90 tablet 3   Cholecalciferol  (VITAMIN D3) 1.25 MG (50000 UT) capsule Take 1 capsule (50,000 Units total) by mouth once a  week. 4 capsule 12   dorzolamide  (TRUSOPT ) 2 % ophthalmic solution Place 1 drop into both eyes 3 (three) times daily. 30 mL 4   fluticasone  (FLONASE ) 50 MCG/ACT nasal spray Place 1 spray into both nostrils daily. 16 g 2   guaiFENesin  (MUCINEX ) 600 MG 12 hr tablet Take 600 mg by mouth 2 (two) times daily.     levocetirizine (XYZAL ) 5 MG tablet Take 1 tablet (5 mg total) by mouth every evening. For sinuses and runny nose. 30 tablet 5   meclizine  (ANTIVERT ) 25 MG tablet Take 1 tablet (25 mg total) by mouth 3 (three) times daily as needed for dizziness. 90 tablet 1   Netarsudil -Latanoprost  (ROCKLATAN ) 0.02-0.005 % SOLN Place 1 drop into both eyes at bedtime. 2.5 mL 11   timolol  (TIMOPTIC ) 0.5 % ophthalmic solution Place 1 drop into both eyes 2 (two) times daily. 5 mL 6   tiZANidine  (ZANAFLEX ) 2 MG tablet Take 1 tablet (2 mg total) by mouth every 6 (six) to 8 (eight) hours. 60 tablet 0   traZODone  (DESYREL ) 100 MG tablet Take 3 tablets (300 mg total) by mouth at bedtime. 270 tablet 3   valsartan  (DIOVAN ) 40 MG tablet Take 1 tablet (40 mg total) by mouth daily. For blood pressure 90 tablet 1   Blood Pressure Monitor MISC Use as directed 1 each 0   No current facility-administered medications on file prior to visit.    ROS as in subjective    Objective: BP 122/64   Pulse 68   Temp 98.4 F (36.9 C)   Wt 245 lb 6.4 oz (111.3 kg)   SpO2 98%   BMI 39.02 kg/m   General appearence: alert, no distress, WD/WN HEENT: normocephalic, sclerae anicteric, TMs flat, nares patent, no discharge or erythema, pharynx normal Oral cavity: MMM, no lesions Neck: supple, no lymphadenopathy, no thyromegaly, no masses Heart: RRR, normal S1, S2, no murmurs Lungs: somewhat decreased in general but no wheezes, rhonchi, or rales Pulses: 2+ symmetric, upper and lower extremities, normal cap refill Ext: no edema    Assessment: Encounter Diagnoses  Name Primary?   Sinobronchitis Yes   Acute cough    Dyspnea,  unspecified type    Fatigue, unspecified type      Plan: Persistent symptoms despite three days of Augmentin . No asthma or lung disease. Allergic to doxycycline . - Continue Augmentin  as prescribed. - Increase water intake. - Perform nasal saline flush daily. - Continue Mucinex  as needed. - continue Albuterol   2 puffs q4-6 hours prn -lab as below -If not much improved in the next few days, then call or recheck    Biviana was seen today for follow-up.  Diagnoses and all orders for this visit:  Sinobronchitis -     CBC with Differential/Platelet  Acute cough  Dyspnea, unspecified type  Fatigue, unspecified type  Other orders -     predniSONE  (DELTASONE ) 20 MG tablet;  Take 1 tablet (20 mg total) by mouth daily with breakfast.   F/u prn

## 2024-04-24 NOTE — Telephone Encounter (Signed)
 FYI Only or Action Required?: FYI only for provider: appointment scheduled on 04/24/24.  Patient was last seen in primary care on 04/17/2024 by Early, Camie BRAVO, NP.  Called Nurse Triage reporting Sinusitis.  Symptoms began several days ago.  Interventions attempted: Prescription medications: antibiotics and Rest, hydration, or home remedies.  Symptoms are: gradually worsening.  Triage Disposition: See HCP Within 4 Hours (Or PCP Triage)  Patient/caregiver understands and will follow disposition?: Yes   Copied from CRM [id number]. Topic: Clinical - Red Word Triage >> Apr 24, 2024 11:15 AM Joesph NOVAK wrote: Red Word that prompted transfer to Nurse Triage: Sinus infection, patient was prescribed medication and not feeling better. No energy, feels weak. Coughing, sore throat. Trouble breathing. Reason for Disposition  [1] Taking antibiotic > 7 days AND [2] nasal discharge not improved  [1] MILD difficulty breathing (e.g., minimal/no SOB at rest, SOB with walking, pulse < 100) AND [2] still present when not coughing  Answer Assessment - Initial Assessment Questions Additional info: Patient was evaluated in office on 04/17/24, dx with sinus infections, she has completed antibiotics with improvement to her sinus pain and congestion. Calling today for new onset shortness of breath, fatigue, persistent cough. Acute visit scheduled today.   1. ANTIBIOTIC: What antibiotic are you taking? How many times a day?     Completed 2. ONSET: When was the antibiotic started?     04/17/24 3. PAIN: How bad is the pain?   (Scale 0-10; or none, mild, moderate or severe)     improved 4. FEVER: Do you have a fever? If Yes, ask: What is it, how was it measured, and when did it start?      denies 5. SYMPTOMS: Are there any other symptoms you're concerned about? If Yes, ask: When did it start?     Cough, sore throat, fatigue, shortness of breath-speaking in full sentences on the call.  Protocols  used: Sinus Infection on Antibiotic Follow-up Call-A-AH, Cough - Acute Non-Productive-A-AH

## 2024-04-25 ENCOUNTER — Ambulatory Visit: Payer: Self-pay | Admitting: Medical

## 2024-04-25 ENCOUNTER — Other Ambulatory Visit: Payer: Self-pay

## 2024-04-25 ENCOUNTER — Other Ambulatory Visit (HOSPITAL_BASED_OUTPATIENT_CLINIC_OR_DEPARTMENT_OTHER): Payer: Self-pay

## 2024-04-25 DIAGNOSIS — I10 Essential (primary) hypertension: Secondary | ICD-10-CM

## 2024-04-25 LAB — CBC WITH DIFFERENTIAL/PLATELET
Basophils Absolute: 0 x10E3/uL (ref 0.0–0.2)
Basos: 1 %
EOS (ABSOLUTE): 0.2 x10E3/uL (ref 0.0–0.4)
Eos: 3 %
Hematocrit: 45.5 % (ref 34.0–46.6)
Hemoglobin: 15.3 g/dL (ref 11.1–15.9)
Immature Grans (Abs): 0 x10E3/uL (ref 0.0–0.1)
Immature Granulocytes: 0 %
Lymphocytes Absolute: 1.7 x10E3/uL (ref 0.7–3.1)
Lymphs: 31 %
MCH: 30.7 pg (ref 26.6–33.0)
MCHC: 33.6 g/dL (ref 31.5–35.7)
MCV: 91 fL (ref 79–97)
Monocytes Absolute: 0.5 x10E3/uL (ref 0.1–0.9)
Monocytes: 9 %
Neutrophils Absolute: 3.2 x10E3/uL (ref 1.4–7.0)
Neutrophils: 56 %
Platelets: 280 x10E3/uL (ref 150–450)
RBC: 4.98 x10E6/uL (ref 3.77–5.28)
RDW: 12.6 % (ref 11.7–15.4)
WBC: 5.7 x10E3/uL (ref 3.4–10.8)

## 2024-04-25 MED ORDER — VALSARTAN 40 MG PO TABS
40.0000 mg | ORAL_TABLET | Freq: Every day | ORAL | 1 refills | Status: AC
  Filled 2024-04-25 – 2024-06-27 (×2): qty 90, 90d supply, fill #0

## 2024-04-25 NOTE — Progress Notes (Signed)
 Results through MyChart

## 2024-04-25 NOTE — Progress Notes (Signed)
   04/25/2024  Patient ID: Emily [name], female   DOB: 07/24/1950, 73 y.o.   MRN: [id number]  Pharmacy Quality Measure Review  This patient is appearing on a report for being at risk of failing the adherence measure for hypertension (ACEi/ARB) medications this calendar year.   Medication: Valsartan  Last fill date: 01/18/24 for 90 day supply  Pharmacy in need of new prescription. Coordinating refill request with PCP.  Jon VEAR Lindau, PharmD Clinical Pharmacist [phone]

## 2024-05-06 ENCOUNTER — Other Ambulatory Visit (HOSPITAL_BASED_OUTPATIENT_CLINIC_OR_DEPARTMENT_OTHER): Payer: Self-pay

## 2024-05-07 DIAGNOSIS — M25551 Pain in right hip: Secondary | ICD-10-CM | POA: Diagnosis not present

## 2024-05-07 DIAGNOSIS — M9905 Segmental and somatic dysfunction of pelvic region: Secondary | ICD-10-CM | POA: Diagnosis not present

## 2024-05-07 DIAGNOSIS — M9903 Segmental and somatic dysfunction of lumbar region: Secondary | ICD-10-CM | POA: Diagnosis not present

## 2024-05-07 DIAGNOSIS — M5116 Intervertebral disc disorders with radiculopathy, lumbar region: Secondary | ICD-10-CM | POA: Diagnosis not present

## 2024-05-09 ENCOUNTER — Other Ambulatory Visit (HOSPITAL_BASED_OUTPATIENT_CLINIC_OR_DEPARTMENT_OTHER): Payer: Self-pay

## 2024-05-20 ENCOUNTER — Other Ambulatory Visit (HOSPITAL_BASED_OUTPATIENT_CLINIC_OR_DEPARTMENT_OTHER): Payer: Self-pay

## 2024-05-21 DIAGNOSIS — M25551 Pain in right hip: Secondary | ICD-10-CM | POA: Diagnosis not present

## 2024-05-21 DIAGNOSIS — M9903 Segmental and somatic dysfunction of lumbar region: Secondary | ICD-10-CM | POA: Diagnosis not present

## 2024-05-21 DIAGNOSIS — M5116 Intervertebral disc disorders with radiculopathy, lumbar region: Secondary | ICD-10-CM | POA: Diagnosis not present

## 2024-05-21 DIAGNOSIS — M9905 Segmental and somatic dysfunction of pelvic region: Secondary | ICD-10-CM | POA: Diagnosis not present

## 2024-05-22 ENCOUNTER — Ambulatory Visit: Payer: Self-pay

## 2024-05-22 VITALS — Ht 66.5 in | Wt 245.0 lb

## 2024-05-22 DIAGNOSIS — Z0000 Encounter for general adult medical examination without abnormal findings: Secondary | ICD-10-CM

## 2024-05-22 NOTE — Patient Instructions (Signed)
 Emily [name],  Thank you for taking the time for your Medicare Wellness Visit. I appreciate your continued commitment to your health goals. Please review the care plan we discussed, and feel free to reach out if I can assist you further.  Please note that Annual Wellness Visits do not include a physical exam. Some assessments may be limited, especially if the visit was conducted virtually. If needed, we may recommend an in-person follow-up with your provider.  Ongoing Care Seeing your primary care provider every 3 to 6 months helps us  monitor your health and provide consistent, personalized care.   Referrals If a referral was made during today's visit and you haven't received any updates within two weeks, please contact the referred provider directly to check on the status.  Recommended Screenings:  Health Maintenance  Topic Date Due   Osteoporosis screening with Bone Density Scan  Never done   COVID-19 Vaccine (4 - 2025-26 season) 02/18/2024   Medicare Annual Wellness Visit  05/28/2024   Flu Shot  09/16/2024*   Colon Cancer Screening  04/13/2025   Breast Cancer Screening  10/10/2025   DTaP/Tdap/Td vaccine (4 - Td or Tdap) 02/25/2030   Pneumococcal Vaccine for age over 64  Completed   Meningitis B Vaccine  Aged Out   Hepatitis C Screening  Discontinued   Zoster (Shingles) Vaccine  Discontinued  *Topic was postponed. The date shown is not the original due date.       05/22/2024   10:08 AM  Advanced Directives  Does Patient Have a Medical Advance Directive? Yes  Type of Estate Agent of [place];Living will  Does patient want to make changes to medical advance directive? No - Patient declined  Copy of Healthcare Power of Attorney in Chart? No - copy requested    Vision: Annual vision screenings are recommended for early detection of glaucoma, cataracts, and diabetic retinopathy. These exams can also reveal signs of chronic conditions such as diabetes and high  blood pressure.  Dental: Annual dental screenings help detect early signs of oral cancer, gum disease, and other conditions linked to overall health, including heart disease and diabetes.  Please see the attached documents for additional preventive care recommendations.

## 2024-05-22 NOTE — Progress Notes (Signed)
 Chief Complaint  Patient presents with   Medicare Wellness     Subjective:   Emily [name] is a 73 y.o. female who presents for a Medicare Annual Wellness Visit.  Visit info / Clinical Intake: Medicare Wellness Visit Type:: Subsequent Annual Wellness Visit Persons participating in visit and providing information:: patient Medicare Wellness Visit Mode:: Video Since this visit was completed virtually, some vitals may be partially provided or unavailable. Missing vitals are due to the limitations of the virtual format.: Documented vitals are patient reported If Telephone or Video please confirm:: I connected with patient using audio/video enable telemedicine. I verified patient identity with two identifiers, discussed telehealth limitations, and patient agreed to proceed. Patient Location:: home Provider Location:: home office Interpreter Needed?: No Pre-visit prep was completed: yes AWV questionnaire completed by patient prior to visit?: no Living arrangements:: lives with spouse/significant other Patient's Overall Health Status Rating: very good Typical amount of pain: some Does pain affect daily life?: (!) yes Are you currently prescribed opioids?: no  Dietary Habits and Nutritional Risks How many meals a day?: 5 Eats fruit and vegetables daily?: yes Most meals are obtained by: preparing own meals; eating out In the last 2 weeks, have you had any of the following?: none Diabetic:: no  Functional Status Activities of Daily Living (to include ambulation/medication): Independent Ambulation: Independent Home Assistive Devices/Equipment: Cane Medication Administration: Independent Home Management (perform basic housework or laundry): Independent Manage your own finances?: yes Primary transportation is: driving Concerns about vision?: (!) yes (goes to eye doctor) Concerns about hearing?: no  Fall Screening Falls in the past year?: 0 Number of falls in past year:  0 Was there an injury with Fall?: 0 Fall Risk Category Calculator: 0 Patient Fall Risk Level: Low Fall Risk  Fall Risk Patient at Risk for Falls Due to: Medication side effect Fall risk Follow up: Falls prevention discussed; Falls evaluation completed  Home and Transportation Safety: All rugs have non-skid backing?: N/A, no rugs All stairs or steps have railings?: yes Grab bars in the bathtub or shower?: (!) no Have non-skid surface in bathtub or shower?: (!) no Good home lighting?: yes Regular seat belt use?: yes Hospital stays in the last year:: no  Cognitive Assessment Difficulty concentrating, remembering, or making decisions? : no Will 6CIT or Mini Cog be Completed: yes What year is it?: 0 points What month is it?: 0 points Give patient an address phrase to remember (5 components): [address] About what time is it?: 0 points Count backwards from 20 to 1: 0 points Say the months of the year in reverse: 0 points Repeat the address phrase from earlier: 0 points 6 CIT Score: 0 points  Advance Directives (For Healthcare) Does Patient Have a Medical Advance Directive?: Yes Does patient want to make changes to medical advance directive?: No - Patient declined Type of Advance Directive: Healthcare Power of [place]; Living will Copy of Healthcare Power of Attorney in Chart?: No - copy requested Copy of Living Will in Chart?: No - copy requested  Reviewed/Updated  Reviewed/Updated: Reviewed All (Medical, Surgical, Family, Medications, Allergies, Care Teams, Patient Goals)    Allergies (verified) Doxycycline    Current Medications (verified) Outpatient Encounter Medications as of 05/22/2024  Medication Sig   albuterol  (VENTOLIN  HFA) 108 (90 Base) MCG/ACT inhaler Inhale 2 puffs into the lungs every 6 (six) hours as needed for wheezing or shortness of breath.   ALPRAZolam  (XANAX ) 1 MG tablet Take 0.5-1 tablets (0.5-1 mg total) by  mouth daily as needed. *this is 90  day supply   aspirin  EC 81 MG tablet Take 81 mg by mouth daily. Swallow whole. Takes once a week   azelastine  (ASTELIN ) 0.1 % nasal spray Place 2 sprays into both nostrils 2 (two) times daily. Use in each nostril for allergy symptoms.   azelastine  (OPTIVAR ) 0.05 % ophthalmic solution Place 2 drops into both eyes 2 (two) times daily.   Blood Pressure Monitor MISC Use as directed   buPROPion  (WELLBUTRIN  XL) 150 MG 24 hr tablet Take 1 tablet (150 mg total) by mouth every morning.   Cholecalciferol  (VITAMIN D3) 1.25 MG (50000 UT) capsule Take 1 capsule (50,000 Units total) by mouth once a week.   dorzolamide  (TRUSOPT ) 2 % ophthalmic solution Place 1 drop into both eyes 3 (three) times daily.   fluticasone  (FLONASE ) 50 MCG/ACT nasal spray Place 1 spray into both nostrils daily.   guaiFENesin  (MUCINEX ) 600 MG 12 hr tablet Take 600 mg by mouth 2 (two) times daily.   levocetirizine (XYZAL ) 5 MG tablet Take 1 tablet (5 mg total) by mouth every evening. For sinuses and runny nose.   meclizine  (ANTIVERT ) 25 MG tablet Take 1 tablet (25 mg total) by mouth 3 (three) times daily as needed for dizziness.   Netarsudil -Latanoprost  (ROCKLATAN ) 0.02-0.005 % SOLN Place 1 drop into both eyes at bedtime.   timolol  (TIMOPTIC ) 0.5 % ophthalmic solution Place 1 drop into both eyes 2 (two) times daily.   tiZANidine  (ZANAFLEX ) 2 MG tablet Take 1 tablet (2 mg total) by mouth every 6 (six) to 8 (eight) hours.   traZODone  (DESYREL ) 100 MG tablet Take 3 tablets (300 mg total) by mouth at bedtime.   valsartan  (DIOVAN ) 40 MG tablet Take 1 tablet (40 mg total) by mouth daily for blood pressure.   amoxicillin -clavulanate (AUGMENTIN ) 875-125 MG tablet Take 1 tablet by mouth 2 (two) times daily. (Patient not taking: Reported on 05/22/2024)   predniSONE  (DELTASONE ) 20 MG tablet Take 1 tablet (20 mg total) by mouth daily with breakfast. (Patient not taking: Reported on 05/22/2024)   No facility-administered encounter medications on file  as of 05/22/2024.    History: Past Medical History:  Diagnosis Date   Acute bacterial sinusitis 06/29/2021   Acute non-recurrent ethmoidal sinusitis 08/05/2021   Acute non-recurrent pansinusitis 01/25/2022   Acute recurrent frontal sinusitis 03/08/2020   Allergy    seasonal    Anxiety    ARTHRALGIA 11/10/2009   Qualifier: Diagnosis of  By: Micheal MD, Bruce     Cancer [facility]) 2009   squamous cell skin   Cataract    bilateral surgery, left unsuccessful   Cough 11/23/2020   Cystitis 02/12/2020   Depression    DYSPNEA 11/10/2009   Qualifier: Diagnosis of  By: Micheal MD, Bruce     Eating disorder    GERD (gastroesophageal reflux disease)    Glaucoma    Head injury 07/23/2018   Hiatal hernia    History of cardiovascular stress test 04/2000   cardiolyte   History of flexible sigmoidoscopy 09/21/1999   Influenza A 03/13/2022   Other fatigue 02/26/2020   Otitis of both ears 03/08/2020   Pain of left lower extremity 03/28/2021   Preoperative evaluation of a medical condition to rule out surgical contraindications (TAR required) 10/18/2021   Shortness of breath 07/29/2022   Sinobronchitis 04/05/2021   Sinusitis 03/01/2023   SIRS (systemic inflammatory response syndrome) vs sepsis d/t COVID 03/01/2023   Subacute cough 11/23/2020   WEIGHT GAIN 11/10/2009  Qualifier: Diagnosis of  By: Micheal MD, Bruce     Past Surgical History:  Procedure Laterality Date   ABDOMINAL HYSTERECTOMY     APPENDECTOMY     CARDIAC CATHETERIZATION  04/2000   CARDIAC CATHETERIZATION     COLONOSCOPY  12/31/09   UPPER GASTROINTESTINAL ENDOSCOPY  09/15/1999   WISDOM TOOTH EXTRACTION     Family History  Problem Relation Age of Onset   Arthritis Other    Cancer Other        lung   Coronary artery disease Other    Stroke Other    Colon cancer Neg Hx    Colon polyps Neg Hx    Esophageal cancer Neg Hx    Rectal cancer Neg Hx    Stomach cancer Neg Hx    Social History   Occupational History    Not on file  Tobacco Use   Smoking status: Former    Current packs/day: 0.00    Average packs/day: 1 pack/day for 10.0 years (10.0 ttl pk-yrs)    Types: Cigarettes    Start date: 05/27/1998    Quit date: 05/27/2008    Years since quitting: 15.9    Passive exposure: Never   Smokeless tobacco: Never  Vaping Use   Vaping status: Never Used  Substance and Sexual Activity   Alcohol use: Yes    Comment: occ   Drug use: No   Sexual activity: Yes    Partners: Male   Tobacco Counseling Counseling given: Not Answered  SDOH Screenings   Food Insecurity: No Food Insecurity (05/22/2024)  Housing: Unknown (05/22/2024)  Transportation Needs: No Transportation Needs (05/22/2024)  Utilities: Not At Risk (05/22/2024)  Alcohol Screen: Low Risk  (05/22/2024)  Depression (PHQ2-9): Medium Risk (05/22/2024)  Financial Resource Strain: Low Risk  (05/22/2024)  Physical Activity: Inactive (05/22/2024)  Social Connections: Socially Integrated (05/22/2024)  Stress: No Stress Concern Present (05/22/2024)  Tobacco Use: Medium Risk (05/22/2024)  Health Literacy: Adequate Health Literacy (05/22/2024)   See flowsheets for full screening details  Depression Screen PHQ 2 & 9 Depression Scale- Over the past 2 weeks, how often have you been bothered by any of the following problems? Little interest or pleasure in doing things: 0 Feeling down, depressed, or hopeless (PHQ Adolescent also includes...irritable): 1 PHQ-2 Total Score: 1 Trouble falling or staying asleep, or sleeping too much: 1 Feeling tired or having little energy: 3 Poor appetite or overeating (PHQ Adolescent also includes...weight loss): 0 Feeling bad about yourself - or that you are a failure or have let yourself or your family down: 0 Trouble concentrating on things, such as reading the newspaper or watching television (PHQ Adolescent also includes...like school work): 1 Moving or speaking so slowly that other people could have noticed. Or the  opposite - being so fidgety or restless that you have been moving around a lot more than usual: 0 Thoughts that you would be better off dead, or of hurting yourself in some way: 0 PHQ-9 Total Score: 6 If you checked off any problems, how difficult have these problems made it for you to do your work, take care of things at home, or get along with other people?: Somewhat difficult  Depression Treatment Depression Interventions/Treatment : Currently on Treatment     Goals Addressed             This Visit's Progress    Patient Stated       05/22/2024, wants to be more healthy  Objective:    Today's Vitals   05/22/24 0959  Weight: 245 lb (111.1 kg)  Height: 5' 6.5 (1.689 m)   Body mass index is 38.95 kg/m.  Hearing/Vision screen Hearing Screening - Comments:: Denies hearing issues Vision Screening - Comments:: Regular eye exams, [place] [facility] Immunizations and Health Maintenance Health Maintenance  Topic Date Due   Bone Density Scan  Never done   COVID-19 Vaccine (4 - 2025-26 season) 02/18/2024   Influenza Vaccine  09/16/2024 (Originally 01/18/2024)   Colonoscopy  04/13/2025   Medicare Annual Wellness (AWV)  05/22/2025   Mammogram  10/10/2025   DTaP/Tdap/Td (4 - Td or Tdap) 02/25/2030   Pneumococcal Vaccine: 50+ Years  Completed   Meningococcal B Vaccine  Aged Out   Hepatitis C Screening  Discontinued   Zoster Vaccines- Shingrix  Discontinued        Assessment/Plan:  This is a routine wellness examination for Emily [name].  Patient Care Team: Early, Camie BRAVO, NP as PCP - General (Nurse Practitioner) Lonni Slain, MD as PCP - Cardiology (Cardiology) Gladis Leonor HERO, MD as Consulting Physician (Pulmonary Disease) Waylan Cain, MD as Consulting Physician (Ophthalmology) Delight Rosaline NOVAK, [place] (Physician Assistant)  I have personally reviewed and noted the following in the patient's chart:   Medical and social history Use of alcohol, tobacco  or illicit drugs  Current medications and supplements including opioid prescriptions. Functional ability and status Nutritional status Physical activity Advanced directives List of other physicians Hospitalizations, surgeries, and ER visits in previous 12 months Vitals Screenings to include cognitive, depression, and falls Referrals and appointments  No orders of the defined types were placed in this encounter.  In addition, I have reviewed and discussed with patient certain preventive protocols, quality metrics, and best practice recommendations. A written personalized care plan for preventive services as well as general preventive health recommendations were provided to patient.   Ardella BRAVO Dawn, LPN   [id number]/[id number]   Return in 1 year (on 05/22/2025).  After Visit Summary: (MyChart) Due to this being a telephonic visit, the after visit summary with patients personalized plan was offered to patient via MyChart   Nurse Notes: Passes on DEXA at this time. Has a lot going on. Declines covid vaccine. Due for flu vaccine

## 2024-05-29 DIAGNOSIS — H401132 Primary open-angle glaucoma, bilateral, moderate stage: Secondary | ICD-10-CM | POA: Diagnosis not present

## 2024-05-29 DIAGNOSIS — H04123 Dry eye syndrome of bilateral lacrimal glands: Secondary | ICD-10-CM | POA: Diagnosis not present

## 2024-06-05 ENCOUNTER — Other Ambulatory Visit (HOSPITAL_BASED_OUTPATIENT_CLINIC_OR_DEPARTMENT_OTHER): Payer: Self-pay

## 2024-06-21 ENCOUNTER — Other Ambulatory Visit (HOSPITAL_BASED_OUTPATIENT_CLINIC_OR_DEPARTMENT_OTHER): Payer: Self-pay

## 2024-06-24 ENCOUNTER — Other Ambulatory Visit (HOSPITAL_BASED_OUTPATIENT_CLINIC_OR_DEPARTMENT_OTHER): Payer: Self-pay

## 2024-06-24 ENCOUNTER — Other Ambulatory Visit: Payer: Self-pay | Admitting: Nurse Practitioner

## 2024-06-24 ENCOUNTER — Ambulatory Visit: Payer: Self-pay

## 2024-06-24 DIAGNOSIS — H1033 Unspecified acute conjunctivitis, bilateral: Secondary | ICD-10-CM

## 2024-06-24 MED ORDER — AZELASTINE HCL 0.05 % OP SOLN
1.0000 [drp] | Freq: Two times a day (BID) | OPHTHALMIC | 3 refills | Status: DC
  Filled 2024-06-24: qty 6, 30d supply, fill #0

## 2024-06-24 NOTE — Telephone Encounter (Signed)
 Attempt # 1 to reach patient to triage symptoms. Left VM to call back    Copied from CRM #[id number]. Topic: Clinical - Red Word Triage >> Jun 24, 2024 10:07 AM [name] wrote: Red Word that prompted transfer to Nurse Triage: Patient is crying yes she is depressed and is in trouble

## 2024-06-24 NOTE — Telephone Encounter (Signed)
 Addressed on alternate encounter.

## 2024-06-24 NOTE — Telephone Encounter (Signed)
 FYI Only or Action Required?: Action required by provider: request for appointment.  Patient was last seen in primary care on 04/24/2024 by [name], PA-C.  Called Nurse Triage reporting Depression.  Symptoms began several weeks ago.  Interventions attempted: Nothing.  Symptoms are: gradually worsening.  Triage Disposition: See Physician Within 24 Hours  Patient/caregiver understands and will follow disposition?: Yes, will follow disposition  Copied from CRM #[id number]. Topic: Clinical - Red Word Triage >> Jun 24, 2024 10:07 AM [name] wrote: Red Word that prompted transfer to Nurse Triage: Patient is crying yes she is depressed and is in trouble >> Jun 24, 2024 10:38 AM [name] wrote: Pt called back for same issue and is wanting an appointment. Holding for NT Reason for Disposition  Sometimes has thoughts of suicide  Answer Assessment - Initial Assessment Questions 1. CONCERN: What happened that made you call today?     Lost job, in nov 2. DEPRESSION SYMPTOM SCREENING: How are you feeling overall? (e.g., decreased energy, increased sleeping or difficulty sleeping, difficulty concentrating, feelings of sadness, guilt, hopelessness, or worthlessness)     Can't stop crying 3. RISK OF HARM - SUICIDAL IDEATION:  Do you ever have thoughts of hurting or killing yourself?  (e.g., yes, no, no but preoccupation with thoughts about death)     denies 4. RISK OF HARM - HOMICIDAL IDEATION:  Do you ever have thoughts of hurting or killing someone else?  (e.g., yes, no, no but preoccupation with thoughts about death)     denies 5. FUNCTIONAL IMPAIRMENT: How have things been going for you overall? Have you had more difficulty than usual doing your normal daily activities?  (e.g., better, same, worse; self-care, school, work, interactions)     same 6. SUPPORT: Who is with you now? Who do you live with? Do you have family or friends who you can talk to?      Has husband  and daughter that are good support system 7. THERAPIST: Do you have a counselor or therapist? If Yes, ask: What is their name?     Denies at this time 8. STRESSORS: Has there been any new stress or recent changes in your life?     Struggling to collect unemployment 9. ALCOHOL USE OR SUBSTANCE USE (DRUG USE): Do you drink alcohol or use any illegal drugs?     denies 10. OTHER: Do you have any other physical symptoms right now? (e.g., fever)       Denies  Pt admits that she stopped taking her Wellbutrin  months ago, states PCP does not know this. Attempted to schedule at PCP clinic, only willing to see PCP or PA Tysinger, please call back to schedule appt.  Protocols used: Depression-A-AH

## 2024-06-28 ENCOUNTER — Other Ambulatory Visit (HOSPITAL_BASED_OUTPATIENT_CLINIC_OR_DEPARTMENT_OTHER): Payer: Self-pay

## 2024-06-29 ENCOUNTER — Other Ambulatory Visit: Payer: Self-pay

## 2024-06-30 ENCOUNTER — Encounter: Payer: Self-pay | Admitting: Nurse Practitioner

## 2024-06-30 ENCOUNTER — Ambulatory Visit: Admitting: Nurse Practitioner

## 2024-06-30 ENCOUNTER — Other Ambulatory Visit (HOSPITAL_BASED_OUTPATIENT_CLINIC_OR_DEPARTMENT_OTHER): Payer: Self-pay

## 2024-06-30 VITALS — BP 126/78 | HR 70 | Wt 250.0 lb

## 2024-06-30 DIAGNOSIS — F339 Major depressive disorder, recurrent, unspecified: Secondary | ICD-10-CM | POA: Diagnosis not present

## 2024-06-30 MED ORDER — AZELASTINE HCL 0.05 % OP SOLN
2.0000 [drp] | Freq: Two times a day (BID) | OPHTHALMIC | 5 refills | Status: AC
  Filled 2024-06-30 – 2024-07-08 (×2): qty 6, 15d supply, fill #0

## 2024-06-30 NOTE — Patient Instructions (Signed)
 Continue with the wellbutrin  150mg  once a day.  If you are not feeling much better in 10 days, increase to 300mg   Managing Depression, Adult Depression is a mental health condition that affects your thoughts, feelings, and actions. Being diagnosed with depression can bring you relief if you did not know why you have felt or behaved a certain way. It could also leave you feeling overwhelmed. Finding ways to manage your symptoms can help you feel more positive about your future. How to manage lifestyle changes Being depressed is difficult. Depression can increase the level of everyday stress. Stress can make depression symptoms worse. You may believe your symptoms cannot be managed or will never improve. However, there are many things you can try to help manage your symptoms. There is hope. Managing stress  Stress is your body's reaction to life changes and events, both good and bad. Stress can add to your feelings of depression. Learning to manage your stress can help lessen your feelings of depression. Try some of the following approaches to reducing your stress (stress reduction techniques): Listen to music that you enjoy and that inspires you. Try using a meditation app or take a meditation class. Develop a practice that helps you connect with your spiritual self. Walk in nature, pray, or go to a place of worship. Practice deep breathing. To do this, inhale slowly through your nose. Pause at the top of your inhale for a few seconds and then exhale slowly, letting yourself relax. Repeat this three or four times. Practice yoga to help relax and work your muscles. Choose a stress reduction technique that works for you. These techniques take time and practice to develop. Set aside 5-15 minutes a day to do them. Therapists can offer training in these techniques. Do these things to help manage stress: Keep a journal. Know your limits. Set healthy boundaries for yourself and others, such as saying no  when you think something is too much. Pay attention to how you react to certain situations. You may not be able to control everything, but you can change your reaction. Add humor to your life by watching funny movies or shows. Make time for activities that you enjoy and that relax you. Spend less time using electronics, especially at night before bed. The light from screens can make your brain think it is time to get up rather than go to bed.  Medicines Medicines, such as antidepressants, are often a part of treatment for depression. Talk with your pharmacist or health care provider about all the medicines, supplements, and herbal products that you take, their possible side effects, and what medicines and other products are safe to take together. Make sure to report any side effects you may have to your health care provider. Relationships Your health care provider may suggest family therapy, couples therapy, or individual therapy as part of your treatment. How to recognize changes Everyone responds differently to treatment for depression. As you recover from depression, you may start to: Have more interest in doing activities. Feel more hopeful. Have more energy. Eat a more regular amount of food. Have better mental focus. It is important to recognize if your depression is not getting better or is getting worse. The symptoms you had in the beginning may return, such as: Feeling tired. Eating too much or too little. Sleeping too much or too little. Feeling restless, agitated, or hopeless. Trouble focusing or making decisions. Having unexplained aches and pains. Feeling irritable, angry, or aggressive. If you or your  family members notice these symptoms coming back, let your health care provider know right away. Follow these instructions at home: Activity Try to get some form of exercise each day, such as walking. Try yoga, mindfulness, or other stress reduction techniques. Participate  in group activities if you are able. Lifestyle Get enough sleep. Cut down on or stop using caffeine, tobacco, alcohol, and any other harmful substances. Eat a healthy diet that includes plenty of vegetables, fruits, whole grains, low-fat dairy products, and lean protein. Limit foods that are high in solid fats, added sugar, or salt (sodium). General instructions Take over-the-counter and prescription medicines only as told by your health care provider. Keep all follow-up visits. It is important for your health care provider to check on your mood, behavior, and medicines. Your health care provider may need to make changes to your treatment. Where to find support Talking to others  Friends and family members can be sources of support and guidance. Talk to trusted friends or family members about your condition. Explain your symptoms and let them know that you are working with a health care provider to treat your depression. Tell friends and family how they can help. Finances Find mental health providers that fit with your financial situation. Talk with your health care provider if you are worried about access to food, housing, or medicine. Call your insurance company to learn about your co-pays and prescription plan. Where to find more information You can find support in your area from: Anxiety and Depression Association of America (ADAA): adaa.org Mental Health America: mentalhealthamerica.net The [employer] on Mental Illness: nami.org Contact a health care provider if: You stop taking your antidepressant medicines, and you have any of these symptoms: Nausea. Headache. Light-headedness. Chills and body aches. Not being able to sleep (insomnia). You or your friends and family think your depression is getting worse. Get help right away if: You have thoughts of hurting yourself or others. Get help right away if you feel like you may hurt yourself or others, or have thoughts about taking  your own life. Go to your nearest emergency room or: Call 911. Call the National Suicide Prevention Lifeline at (505)382-0167 or 988. This is open 24 hours a day. Text the Crisis Text Line at 5092903246. This information is not intended to replace advice given to you by your health care provider. Make sure you discuss any questions you have with your health care provider. Document Revised: 10/11/2021 Document Reviewed: 10/11/2021 Elsevier Patient Education  2024 [employer].

## 2024-06-30 NOTE — Assessment & Plan Note (Addendum)
 Worsening depressive symptoms with increased stressors, including job loss, personal losses, and health issues. Symptoms include suicidal ideation, excessive sleep, lack of energy, and anhedonia. Currently on Wellbutrin  150 mg, with some improvement after three days. Discussed potential addition of fluoxetine for increased energy, but she expressed fear of SSRIs. Discussed cognitive behavioral therapy for thought process retraining. Emphasized importance of addressing mood to improve physical symptoms and overall well-being. Emergency services discussed for SI today. No alarm symptoms present at this time.  - Continue Wellbutrin  150 mg daily. - Will consider increasing Wellbutrin  to 300 mg if no improvement in 10-14 days. - Discussed potential referral to a therapist for cognitive behavioral therapy. - Scheduled follow-up in four weeks to reassess symptoms and treatment efficacy.

## 2024-06-30 NOTE — Progress Notes (Signed)
 "  Catheline Doing, DNP, AGNP-c [facility]  [address] [place], [place] [address] [phone]   ESTABLISHED PATIENT- Chronic Health and/or Follow-Up Visit on 06/30/2024  Blood pressure 126/78, pulse 70, weight 250 lb (113.4 kg).   Subjective:  Depression (Issues with depression, years going on, last few month has been worse, )  History of Present Illness Emily [name] is a 74 year old female who presents with worsening depressive symptoms.  She describes a significant worsening of her depressive symptoms, which she considers the worst in years, with increased crying spells and overwhelming stress attributed to recent unexpected life events such as job loss and the passing of a family member. She restarted Wellbutrin  150 mg three days ago and notes some improvement, particularly in her passing suicidal ideation, but continues to feel excessively sleepy and lacks energy.  She reports sleeping excessively both at night and during the day, especially after taking Mucinex  for sinus congestion, which exacerbates her fatigue. She lacks motivation to leave the house or get out of bed and experiences significant fatigue with minimal exertion, such as walking to the car.   Her history of depression dates back to 1966, with a previous severe episode requiring hospitalization for anorexia. She recalls being on Wellbutrin  in the past, possibly in combination with another medication, but cannot remember the specifics. She has been off Wellbutrin  for approximately three years before restarting it recently.  She has experienced significant life stressors recently, including job loss in November, which she feels has contributed to her current depressive state. She also mentions the emotional impact of visiting a friend's grave on the anniversary of her passing and the stress of dealing with a broken computer while trying to apply for unemployment.  She mentions a recent episode  of vision loss in her left eye, which has affected her ability to read comfortably.   Her family history includes the recent passing of her father's sister, which has added to her emotional burden. She also discusses her daughter's move to [place] and the potential departure of her granddaughter, estanislado has lived with her for the past two years, which she finds emotionally challenging.  ROS negative except for what is listed in HPI. History, Medications, Surgery, SDOH, and Family History reviewed and updated as appropriate.  Objective:  Physical Exam Vitals and nursing note reviewed.  Constitutional:      General: She is not in acute distress.    Appearance: She is not ill-appearing.  HENT:     Head: Normocephalic.  Eyes:     Pupils: Pupils are equal, round, and reactive to light.  Cardiovascular:     Rate and Rhythm: Normal rate.  Pulmonary:     Effort: Pulmonary effort is normal.  Skin:    General: Skin is warm and dry.  Neurological:     Mental Status: She is alert and oriented to person, place, and time.  Psychiatric:        Attention and Perception: Attention normal.        Mood and Affect: Mood is depressed. Affect is flat and tearful.        Speech: Speech normal.        Behavior: Behavior is slowed and withdrawn. Behavior is cooperative.        Thought Content: Thought content normal.        Cognition and Memory: Cognition and memory normal.        Judgment: Judgment normal.  Assessment & Plan:   Assessment & Plan Depression, recurrent Worsening depressive symptoms with increased stressors, including job loss, personal losses, and health issues. Symptoms include suicidal ideation, excessive sleep, lack of energy, and anhedonia. Currently on Wellbutrin  150 mg, with some improvement after three days. Discussed potential addition of fluoxetine for increased energy, but she expressed fear of SSRIs. Discussed cognitive behavioral therapy for thought process  retraining. Emphasized importance of addressing mood to improve physical symptoms and overall well-being. Emergency services discussed for SI today. No alarm symptoms present at this time.  - Continue Wellbutrin  150 mg daily. - Will consider increasing Wellbutrin  to 300 mg if no improvement in 10-14 days. - Discussed potential referral to a therapist for cognitive behavioral therapy. - Scheduled follow-up in four weeks to reassess symptoms and treatment efficacy.      Tempestt Silba E Zayvon Alicea, DNP, AGNP-c  45 minutes spent on care provided today. Time includes care provided during the visit (>50% total time), care coordination, chart review, and documentation.   "

## 2024-06-30 NOTE — Addendum Note (Signed)
 Addended by: ORIS DANKER E on: 06/30/2024 10:22 AM   Modules accepted: Orders

## 2024-07-07 ENCOUNTER — Other Ambulatory Visit (HOSPITAL_BASED_OUTPATIENT_CLINIC_OR_DEPARTMENT_OTHER): Payer: Self-pay

## 2024-07-07 ENCOUNTER — Other Ambulatory Visit (HOSPITAL_COMMUNITY): Payer: Self-pay

## 2024-07-08 ENCOUNTER — Other Ambulatory Visit (HOSPITAL_BASED_OUTPATIENT_CLINIC_OR_DEPARTMENT_OTHER): Payer: Self-pay

## 2024-07-08 ENCOUNTER — Other Ambulatory Visit: Payer: Self-pay

## 2024-07-08 MED ORDER — ROCKLATAN 0.02-0.005 % OP SOLN
1.0000 [drp] | Freq: Every evening | OPHTHALMIC | 4 refills | Status: AC
  Filled 2024-07-08: qty 2.5, 50d supply, fill #0

## 2024-07-31 ENCOUNTER — Ambulatory Visit: Admitting: Nurse Practitioner

## 2024-09-15 ENCOUNTER — Ambulatory Visit: Payer: Self-pay | Admitting: Nurse Practitioner
